# Patient Record
Sex: Female | Born: 1950
Health system: Southern US, Community
[De-identification: ages and names within clinical notes are randomized; demographics above are authoritative.]

## PROBLEM LIST (undated history)

## (undated) DIAGNOSIS — K219 Gastro-esophageal reflux disease without esophagitis: Secondary | ICD-10-CM

## (undated) DIAGNOSIS — R799 Abnormal finding of blood chemistry, unspecified: Secondary | ICD-10-CM

## (undated) DIAGNOSIS — E559 Vitamin D deficiency, unspecified: Secondary | ICD-10-CM

## (undated) DIAGNOSIS — R7303 Prediabetes: Secondary | ICD-10-CM

## (undated) DIAGNOSIS — E041 Nontoxic single thyroid nodule: Secondary | ICD-10-CM

## (undated) DIAGNOSIS — E876 Hypokalemia: Secondary | ICD-10-CM

## (undated) DIAGNOSIS — E782 Mixed hyperlipidemia: Secondary | ICD-10-CM

## (undated) DIAGNOSIS — M25511 Pain in right shoulder: Secondary | ICD-10-CM

## (undated) DIAGNOSIS — R209 Unspecified disturbances of skin sensation: Secondary | ICD-10-CM

## (undated) DIAGNOSIS — K449 Diaphragmatic hernia without obstruction or gangrene: Secondary | ICD-10-CM

## (undated) DIAGNOSIS — R768 Other specified abnormal immunological findings in serum: Secondary | ICD-10-CM

## (undated) DIAGNOSIS — Z0000 Encounter for general adult medical examination without abnormal findings: Secondary | ICD-10-CM

## (undated) DIAGNOSIS — R32 Unspecified urinary incontinence: Secondary | ICD-10-CM

## (undated) DIAGNOSIS — J42 Unspecified chronic bronchitis: Secondary | ICD-10-CM

## (undated) DIAGNOSIS — R609 Edema, unspecified: Secondary | ICD-10-CM

## (undated) DIAGNOSIS — E039 Hypothyroidism, unspecified: Secondary | ICD-10-CM

## (undated) DIAGNOSIS — N6009 Solitary cyst of unspecified breast: Secondary | ICD-10-CM

## (undated) DIAGNOSIS — E785 Hyperlipidemia, unspecified: Secondary | ICD-10-CM

## (undated) DIAGNOSIS — K760 Fatty (change of) liver, not elsewhere classified: Secondary | ICD-10-CM

## (undated) DIAGNOSIS — R0789 Other chest pain: Secondary | ICD-10-CM

## (undated) DIAGNOSIS — C4490 Unspecified malignant neoplasm of skin, unspecified: Secondary | ICD-10-CM

## (undated) DIAGNOSIS — R490 Dysphonia: Secondary | ICD-10-CM

## (undated) DIAGNOSIS — R739 Hyperglycemia, unspecified: Secondary | ICD-10-CM

## (undated) DIAGNOSIS — Z780 Asymptomatic menopausal state: Secondary | ICD-10-CM

## (undated) DIAGNOSIS — R0602 Shortness of breath: Secondary | ICD-10-CM

## (undated) DIAGNOSIS — G4730 Sleep apnea, unspecified: Secondary | ICD-10-CM

## (undated) DIAGNOSIS — M2550 Pain in unspecified joint: Secondary | ICD-10-CM

## (undated) DIAGNOSIS — I10 Essential (primary) hypertension: Secondary | ICD-10-CM

## (undated) DIAGNOSIS — M129 Arthropathy, unspecified: Secondary | ICD-10-CM

## (undated) DIAGNOSIS — Z79899 Other long term (current) drug therapy: Secondary | ICD-10-CM

## (undated) DIAGNOSIS — M549 Dorsalgia, unspecified: Secondary | ICD-10-CM

## (undated) DIAGNOSIS — R002 Palpitations: Secondary | ICD-10-CM

## (undated) DIAGNOSIS — L0390 Cellulitis, unspecified: Secondary | ICD-10-CM

## (undated) DIAGNOSIS — E079 Disorder of thyroid, unspecified: Secondary | ICD-10-CM

## (undated) DIAGNOSIS — K5900 Constipation, unspecified: Secondary | ICD-10-CM

## (undated) DIAGNOSIS — E669 Obesity, unspecified: Secondary | ICD-10-CM

## (undated) HISTORY — DX: Hypothyroidism, unspecified: E03.9

## (undated) HISTORY — DX: Pain in right shoulder: M25.511

## (undated) HISTORY — DX: Gastro-esophageal reflux disease without esophagitis: K21.9

## (undated) HISTORY — DX: Abnormal finding of blood chemistry, unspecified: R79.9

## (undated) HISTORY — DX: Other chest pain: R07.89

## (undated) HISTORY — DX: Mixed hyperlipidemia: E78.2

## (undated) HISTORY — DX: Unspecified disturbances of skin sensation: R20.9

## (undated) HISTORY — DX: Palpitations: R00.2

## (undated) HISTORY — DX: Hyperglycemia, unspecified: R73.9

## (undated) HISTORY — PX: BREAST CYST ASPIRATION: SHX578

## (undated) HISTORY — DX: Constipation, unspecified: K59.00

## (undated) HISTORY — DX: Hyperlipidemia, unspecified: E78.5

## (undated) HISTORY — DX: Shortness of breath: R06.02

## (undated) HISTORY — DX: Sleep apnea, unspecified: G47.30

## (undated) HISTORY — DX: Pain in unspecified joint: M25.50

## (undated) HISTORY — DX: Dorsalgia, unspecified: M54.9

## (undated) HISTORY — DX: Obesity, unspecified: E66.9

## (undated) HISTORY — DX: Other long term (current) drug therapy: Z79.899

## (undated) HISTORY — DX: Cellulitis, unspecified: L03.90

## (undated) HISTORY — DX: Arthropathy, unspecified: M12.9

## (undated) HISTORY — DX: Disorder of thyroid, unspecified: E07.9

## (undated) HISTORY — DX: Encounter for general adult medical examination without abnormal findings: Z00.00

## (undated) HISTORY — DX: Edema, unspecified: R60.9

## (undated) HISTORY — DX: Unspecified urinary incontinence: R32

## (undated) HISTORY — DX: Nontoxic single thyroid nodule: E04.1

## (undated) HISTORY — DX: Diaphragmatic hernia without obstruction or gangrene: K44.9

## (undated) HISTORY — DX: Other specified abnormal immunological findings in serum: R76.8

## (undated) HISTORY — DX: Vitamin D deficiency, unspecified: E55.9

## (undated) HISTORY — PX: WISDOM TOOTH EXTRACTION: SHX21

## (undated) HISTORY — DX: Asymptomatic menopausal state: Z78.0

## (undated) HISTORY — DX: Dysphonia: R49.0

## (undated) HISTORY — DX: Essential (primary) hypertension: I10

## (undated) HISTORY — DX: Hypokalemia: E87.6

---

## 1988-06-08 HISTORY — PX: LIPOSUCTION: SHX10

## 2012-06-08 LAB — HM COLONOSCOPY

## 2013-06-08 LAB — HM MAMMOGRAPHY: HM MAMMO: NORMAL (ref 0–4)

## 2013-06-08 LAB — HM PAP SMEAR: HM Pap smear: NORMAL

## 2014-10-04 ENCOUNTER — Encounter: Payer: Self-pay | Admitting: Internal Medicine

## 2014-12-05 ENCOUNTER — Encounter: Payer: Self-pay | Admitting: Internal Medicine

## 2014-12-11 ENCOUNTER — Ambulatory Visit (INDEPENDENT_AMBULATORY_CARE_PROVIDER_SITE_OTHER): Admitting: Internal Medicine

## 2014-12-11 ENCOUNTER — Encounter: Payer: Self-pay | Admitting: Internal Medicine

## 2014-12-11 VITALS — BP 110/74 | HR 77 | Temp 98.4°F | Resp 16 | Ht 62.5 in | Wt 195.0 lb

## 2014-12-11 DIAGNOSIS — E041 Nontoxic single thyroid nodule: Secondary | ICD-10-CM | POA: Diagnosis not present

## 2014-12-11 DIAGNOSIS — E039 Hypothyroidism, unspecified: Secondary | ICD-10-CM | POA: Diagnosis not present

## 2014-12-11 DIAGNOSIS — E079 Disorder of thyroid, unspecified: Secondary | ICD-10-CM

## 2014-12-11 HISTORY — DX: Disorder of thyroid, unspecified: E07.9

## 2014-12-11 LAB — T3, FREE: T3, Free: 4.5 pg/mL — ABNORMAL HIGH (ref 2.3–4.2)

## 2014-12-11 LAB — T4, FREE: Free T4: 0.74 ng/dL (ref 0.60–1.60)

## 2014-12-11 LAB — TSH: TSH: 0.64 u[IU]/mL (ref 0.35–4.50)

## 2014-12-11 NOTE — Patient Instructions (Signed)
Please stop at the lab.  Please continue the Armour 120 mg daily.  Take the thyroid hormone every day, with water, >30 minutes before breakfast, separated by >4 hours from acid reflux medications, calcium, iron, multivitamins.  Please come back for a follow-up appointment in 4 months.

## 2014-12-11 NOTE — Progress Notes (Addendum)
Patient ID: Destiny [name], female   DOB: 05/27/1951, 64 y.o.   MRN: [id number]   HPI  Destiny [name] is a 64 y.o.-year-old female, referred by her PCP, Isaias Cowman, PA , for evaluation for a left thyroid nodule. She also has hypothyroidism.  Her thyroid nodule was incidentally found in 09/2014.   Thyroid U/S [facility], 10/01/2014):  Right lobe: 3.3 x 1.0 x 0.6 cm: 5 mm upper pole nodule, 5 mm mid pole nodule and 6 mm lower pole nodule  Left lobe: 3.9 x 0.9 x 1.1 cm. 2.4 x 0.4 x 0.5 cm hypoechoic midpole nodule extending into upper and lower poles.  Isthmus: 0.8 cm  Patient also has hypothyroidism diagnosed 30 years ago. She started on Synthroid >> delusions >> started Armour 25 years ago.   She is taking Armour 120 mg daily (equivalent of 200 g LT4 daily) - last change 6 mo ago to 90 mg, then back to 120 mg 3 mo ago: - In a.m. - Fasting, but not always! - With water   - No PPI, multivitamins, calcium, iron   No recent thyroid tests available.  Pt denies feeling nodules in neck, hoarseness, occasional dysphagia/no odynophagia, sometimes get choked; no SOB with lying down.  Pt c/o: - no heat intolerance/+ cold intolerance - + weight loss - intentional; diet and exercise - no tremors - no palpitations - no hyperdefecation/+ constipation - + dry skin - + hair loss - + fatigue - + anxiety/+ depression - + "Brain fog"  Pt does have a FH of thyroid ds.: aunts, sister, niece. No FH of thyroid cancer. No h/o radiation tx to head or neck.   No seaweed or kelp, no recent contrast studies. No steroid use. No herbal supplements.   I reviewed her chart and she also has a history of hypertension, hyperlipidemia, obesity, right shoulder pain, edema, positive ANA.  ROS: Constitutional: no weight gain/loss, + fatigue, + subjective hypothermia, + poor sleep Eyes: no blurry vision, no xerophthalmia ENT: no sore throat, no nodules palpated in throat, + dysphagia/no  odynophagia, no hoarseness, + tinnitus, decreased hearing Cardiovascular: no CP/SOB/palpitations/+ leg swelling Respiratory: no cough/SOB Gastrointestinal: + N/no V/D/+ C, + acid reflux Musculoskeletal:  Both: muscle/joint aches Skin: no rashes; + hair loss, itching, rash Neurological: no tremors/numbness/tingling/dizziness Psychiatric: + both: depression/anxiety  Past Medical History  Diagnosis Date  . High risk medication use   . Positive ANA (antinuclear antibody)   . Edema   . Shoulder pain, right   . Hypertension   . Hyperlipidemia   . Obesity   . Thyroid nodule   . Hypothyroidism   . GERD (gastroesophageal reflux disease)   . Menopause   . SOB (shortness of breath)   . Hyperglycemia   . Abnormal blood chemistry   . Disturbance of skin sensation   . Chest discomfort   . Arthropathy   . Cellulitis   . Vitamin D deficiency   . Hypopotassemia    No past surgical history on file. History   Social History  . Marital Status: Married    Spouse Name: N/A  . Number of Children: 0   Occupational History  . n/a   Social History Main Topics  . Smoking status: Former Research scientist (life sciences)  . Smokeless tobacco: Not on file  . Alcohol Use: 0.0 oz/week    0 Standard drinks or equivalent per week  . Drug Use: No   Social History Narrative   Occasional alcohol use: hard liquor, wine  and beer   Current Outpatient Prescriptions on File Prior to Visit  Medication Sig Dispense Refill  . hydrochlorothiazide (HYDRODIURIL) 25 MG tablet Take 25 mg by mouth daily.    . irbesartan (AVAPRO) 300 MG tablet Take 150 mg by mouth daily.    . metoprolol succinate (TOPROL-XL) 25 MG 24 hr tablet Take 25 mg by mouth daily.    . phentermine 37.5 MG capsule Take 37.5 mg by mouth every morning.    . thyroid (ARMOUR) 120 MG tablet Take 120 mg by mouth daily before breakfast.    . Cholecalciferol (VITAMIN D PO) Take by mouth.    . COD LIVER OIL PO Take by mouth.    . Cyanocobalamin (B12 LIQUID HEALTH  BOOSTER PO) Take 2 drops by mouth daily.    . RED YEAST RICE EXTRACT PO Take by mouth.     No current facility-administered medications on file prior to visit.   No Known Allergies Family History  Problem Relation Age of Onset  . Irregular heart beat Mother   . Hypertension Father    also,  - diabetes in grandfather - Hyperlipidemia in father - Cancer in uncle - + See history of present illness  PE: BP 110/74 mmHg  Pulse 77  Temp(Src) 98.4 F (36.9 C) (Oral)  Resp 16  Ht 5' 2.5" (1.588 m)  Wt 195 lb (88.451 kg)  BMI 35.08 kg/m2  SpO2 95% Wt Readings from Last 3 Encounters:  12/11/14 195 lb (88.451 kg)   Constitutional: overweight, in NAD Eyes: PERRLA, EOMI, no exophthalmos ENT: moist mucous membranes, no thyromegaly, no thyroid nodules palpable, no cervical lymphadenopathy Cardiovascular: RRR, No MRG Respiratory: CTA B Gastrointestinal: abdomen soft, NT, ND, BS+ Musculoskeletal: no deformities, strength intact in all 4;  Skin: moist, warm, no rashes Neurological: no tremor with outstretched hands, DTR normal in all 4  ASSESSMENT: 1. L thyroid nodule - largest dimension 2.4 cm  2. Hypothyroidism - On Armour thyroid  PLAN: 1. L thyroid nodule - I reviewed the report of her thyroid ultrasound along with the patient. I pointed out that the dominant nodule is large and hypoechoic, these being risk factors for cI do not have other information about them, and I could not reviewed the images since they were not available. Pt does not have a thyroid cancer family history or a personal history of RxTx to head/neck. These would favor benignity.   - the only way that we can tell exactly if it is cancer or not is by doing a thyroid biopsy (FNA). I explained what the test entails. - I explained that this is not cancer, we can continue to follow her on a yearly basis, and check another ultrasound in another year or 2.  - patient decided to have the FNA done now >> I ordered this.   - I'll see her back in  4 months assuming her FNA is normal. If FNA abnormal, we will meet sooner.  - I advised pt to join my chart and I will send her the results through there   2. Hypothyroidism - She is on 120 mg Armour thyroid - no previous available thyroid labs, will check them today, along with her thyroid antibodies - We discussed about taking the thyroid hormone every day, with water, >30 minutes before breakfast, separated by >4 hours from acid reflux medications, calcium, iron, multivitamins. I advised her to separate her arm or from other medications and also from her breakfast. - Return in about 4 months (  around 04/13/2015).  Office Visit on 12/11/2014  Component Date Value Ref Range Status  . TSH 12/11/2014 0.64  0.35 - 4.50 uIU/mL Final  . Free T4 12/11/2014 0.74  0.60 - 1.60 ng/dL Final  . T3, Free 12/11/2014 4.5* 2.3 - 4.2 pg/mL Final  . Thyroperoxidase Ab SerPl-aCnc 12/11/2014 7  <9 IU/mL Final   Good thyroid tests, continue the same dose of Armour.  Thyroid antibodies low, not confirming Hashimoto thyroiditis.   01/23/2015: CLINICAL DATA: Thyroid nodules. Referred for biopsy.  EXAM: THYROID ULTRASOUND  TECHNIQUE: Ultrasound examination of the thyroid gland and adjacent soft tissues was performed.  COMPARISON: 10/01/2014 from [facility]: Right thyroid lobe  Measurements: 31 x 7 x 8 mm. Inhomogeneous with multiple small hypoechoic nodules all less than 4 mm  Left thyroid lobe  Measurements: 32 x 810 x 9 mm. Multiple cystic/ hypoechoic nodules measured up to 5 cm maximum diameter. A (I believe 5 mm is correct!)  Isthmus  Thickness: 1.5 mm. No nodules visualized.  Lymphadenopathy  None visualized.  IMPRESSION: 1. Multiple small bilateral hypoechoic/cystic nodules. No dominant lesion was identified ; biopsy was not performed. Follow-up by clinical exam is recommended. If patient has known risk factors for thyroid carcinoma,  consider follow-up ultrasound in 12 months. If patient is clinically hyperthyroid, consider nuclear medicine thyroid uptake and scan. This recommendation follows the consensus statement: Management of Thyroid Nodules Detected as [place]: Society of Radiologists in [place]. Radiology 2005; [id number].   Electronically Signed By: Lucrezia Europe M.D. On: 01/23/2015 13:17  It looks like the L thyroid nodule seen on the [facility] U/S was actually an inflammatory focus which has since resolved!

## 2014-12-12 ENCOUNTER — Encounter: Payer: Self-pay | Admitting: *Deleted

## 2014-12-12 LAB — THYROID PEROXIDASE ANTIBODY: Thyroperoxidase Ab SerPl-aCnc: 7 IU/mL (ref <9)

## 2014-12-31 ENCOUNTER — Inpatient Hospital Stay
Admission: RE | Admit: 2014-12-31 | Discharge: 2014-12-31 | Disposition: A | Discharge status: Home / Self Care | Payer: Self-pay | Source: Ambulatory Visit | Attending: Internal Medicine | Admitting: Internal Medicine

## 2014-12-31 ENCOUNTER — Other Ambulatory Visit: Payer: Self-pay | Admitting: Internal Medicine

## 2014-12-31 DIAGNOSIS — E041 Nontoxic single thyroid nodule: Secondary | ICD-10-CM

## 2015-01-01 ENCOUNTER — Telehealth: Payer: Self-pay

## 2015-01-01 NOTE — Telephone Encounter (Signed)
Pt called stating about 1 week ago she dropped of a thyroid ultrasound report on a CD for Dr. Cruzita Lederer to review. Pt stated Dr. Cruzita Lederer was going to review the results and decide how to proceed with the thyroid biopsy.  Has Dr. Cruzita Lederer been able to review results?

## 2015-01-02 NOTE — Telephone Encounter (Signed)
Please read message below and advise.  

## 2015-01-02 NOTE — Telephone Encounter (Signed)
I reviewed the CD and the order for Bx is in >> was she not called to schedule.

## 2015-01-02 NOTE — Telephone Encounter (Signed)
Called pt and (pt was napping) advised pt's husband that Dr Cruzita Lederer has reviewed her U/S and has ordered a bx. He stated he will let her know.

## 2015-01-08 ENCOUNTER — Other Ambulatory Visit: Payer: Self-pay | Admitting: Internal Medicine

## 2015-01-08 ENCOUNTER — Telehealth: Payer: Self-pay | Admitting: *Deleted

## 2015-01-08 DIAGNOSIS — E041 Nontoxic single thyroid nodule: Secondary | ICD-10-CM

## 2015-01-08 NOTE — Telephone Encounter (Signed)
Great. Thank you.

## 2015-01-08 NOTE — Telephone Encounter (Signed)
I reordered the U/S to be done in [place].

## 2015-01-08 NOTE — Telephone Encounter (Signed)
The pt already has an appt to get that done on 01/23/2015 at 11:30am.[place] has agreed to do both on that day.

## 2015-01-08 NOTE — Telephone Encounter (Signed)
Baker Janus from [facility] called to let Dr Cruzita Lederer know that he received the disc of the U/S and the images were not good. He advised for the pt to have another U/S soft tissue head and neck. Then they can move forward with a biopsy if needed. Be advised.

## 2015-01-17 ENCOUNTER — Other Ambulatory Visit

## 2015-01-23 ENCOUNTER — Encounter (INDEPENDENT_AMBULATORY_CARE_PROVIDER_SITE_OTHER): Payer: Self-pay

## 2015-01-23 ENCOUNTER — Other Ambulatory Visit: Payer: Self-pay | Admitting: Internal Medicine

## 2015-01-23 ENCOUNTER — Ambulatory Visit
Admission: RE | Admit: 2015-01-23 | Discharge: 2015-01-23 | Disposition: A | Discharge status: Home / Self Care | Source: Ambulatory Visit | Attending: Internal Medicine | Admitting: Internal Medicine

## 2015-01-23 DIAGNOSIS — E041 Nontoxic single thyroid nodule: Secondary | ICD-10-CM

## 2015-01-25 ENCOUNTER — Encounter: Payer: Self-pay | Admitting: *Deleted

## 2015-04-15 ENCOUNTER — Ambulatory Visit (INDEPENDENT_AMBULATORY_CARE_PROVIDER_SITE_OTHER): Admitting: Internal Medicine

## 2015-04-15 ENCOUNTER — Encounter: Payer: Self-pay | Admitting: Internal Medicine

## 2015-04-15 VITALS — BP 114/62 | HR 83 | Temp 98.1°F | Resp 12 | Wt 193.0 lb

## 2015-04-15 DIAGNOSIS — E041 Nontoxic single thyroid nodule: Secondary | ICD-10-CM

## 2015-04-15 DIAGNOSIS — E039 Hypothyroidism, unspecified: Secondary | ICD-10-CM | POA: Diagnosis not present

## 2015-04-15 LAB — TSH: TSH: 0.08 u[IU]/mL — ABNORMAL LOW (ref 0.35–4.50)

## 2015-04-15 LAB — T3, FREE: T3, Free: 3.9 pg/mL (ref 2.3–4.2)

## 2015-04-15 LAB — T4, FREE: Free T4: 0.76 ng/dL (ref 0.60–1.60)

## 2015-04-15 NOTE — Patient Instructions (Signed)
Please stop at the lab.  Please schedule a new appt at the lab in 6 weeks.  Please return in 3 months.

## 2015-04-15 NOTE — Progress Notes (Signed)
Patient ID: Destiny [name], female   DOB: 1950-09-06, 64 y.o.   MRN: [id number]   HPI  Destiny [name] is a 64 y.o.-year-old female, initially referred by her PCP, Isaias Cowman, PA , now returning for follow-up for left thyroid nodule. She also has hypothyroidism. Last visit 4 mo ago.  Her thyroid nodule was incidentally found in 09/2014. She had an initial thyroid ultrasound in 09/2014 that showed a large, 2.4 cm nodule. At last visit I referred her to for biopsy of the nodule, however, a new thyroid ultrasound did not show the nodule. No biopsy was performed during the session.  Thyroid U/S [facility], 10/01/2014):  Right lobe: 3.3 x 1.0 x 0.6 cm: 5 mm upper pole nodule, 5 mm mid pole nodule and 6 mm lower pole nodule  Left lobe: 3.9 x 0.9 x 1.1 cm. 2.4 x 0.4 x 0.5 cm hypoechoic midpole nodule extending into upper and lower poles.  Isthmus: 0.8 cm  Thyroid U/S (01/23/2015): Multiple small bilateral hypoechoic/cystic nodules. No dominant lesion was identified ; biopsy was not performed.   Patient also has hypothyroidism diagnosed 30 years ago. She started on Synthroid >> delusions >> started Armour 25 years ago.   She is taking Armour 120 mg daily (equivalent of 200 g LT4 daily) - last change 6 mo ago to 90 mg, then back to 120 mg 3 mo ago: - In a.m. - Fasting, eats b'fast >30 min later - With water   - No PPI, iron  - takes MVI, Ca,  in am 1h after Armour!  Last TFTs: Lab Results  Component Value Date   TSH 0.64 12/11/2014   FREET4 0.74 12/11/2014  01/28/2015: TSH 0.051, TT3 247.56 (60-181), free T4 1.33 (0.89-1.76)  Pt denies feeling nodules in neck, hoarseness, occasional dysphagia/no odynophagia; no SOB with lying down.  Pt c/o: - no heat intolerance/cold intolerance - + weight gain - no tremors - no palpitations - no hyperdefecation/+ constipation - + dry skin - + hair loss - + fatigue - + "Brain fog"  Pt does have a FH of thyroid ds.: aunts, sister,  niece (Hashimoto's thyroiditis). No FH of thyroid cancer. No h/o radiation tx to head or neck.   No seaweed or kelp, no recent contrast studies. No steroid use. No herbal supplements.   I reviewed her chart and she also has a history of hypertension, hyperlipidemia, obesity, right shoulder pain, edema, positive ANA.  ROS: Constitutional: no weight gain/loss, + fatigue, + subjective hypothermia, + poor sleep Eyes: no blurry vision, no xerophthalmia ENT: no sore throat, no nodules palpated in throat, + dysphagia/no odynophagia, no hoarseness, + tinnitus, decreased hearing Cardiovascular: no CP/SOB/palpitations/+ leg swelling Respiratory: no cough/SOB Gastrointestinal: + N/no V/D/+ C, + acid reflux Musculoskeletal:  Both: muscle/joint aches Skin: no rashes; + hair loss, itching, rash Neurological: no tremors/numbness/tingling/dizziness I reviewed pt's medications, allergies, PMH, social hx, family hx, and changes were documented in the history of present illness. Otherwise, unchanged from my initial visit note.  Past Medical History  Diagnosis Date  . High risk medication use   . Positive ANA (antinuclear antibody)   . Edema   . Shoulder pain, right   . Hypertension   . Hyperlipidemia   . Obesity   . Thyroid nodule   . Hypothyroidism   . GERD (gastroesophageal reflux disease)   . Menopause   . SOB (shortness of breath)   . Hyperglycemia   . Abnormal blood chemistry   . Disturbance of  skin sensation   . Chest discomfort   . Arthropathy   . Cellulitis   . Vitamin D deficiency   . Hypopotassemia    No past surgical history on file. History   Social History  . Marital Status: Married    Spouse Name: N/A  . Number of Children: 0   Occupational History  . n/a   Social History Main Topics  . Smoking status: Former Research scientist (life sciences)  . Smokeless tobacco: Not on file  . Alcohol Use: 0.0 oz/week    0 Standard drinks or equivalent per week  . Drug Use: No   Social History Narrative    Occasional alcohol use: hard liquor, wine and beer   Current Outpatient Prescriptions on File Prior to Visit  Medication Sig Dispense Refill  . Cholecalciferol (VITAMIN D PO) Take by mouth.    . COD LIVER OIL PO Take by mouth.    . Cyanocobalamin (B12 LIQUID HEALTH BOOSTER PO) Take 2 drops by mouth daily.    . hydrochlorothiazide (HYDRODIURIL) 25 MG tablet Take 25 mg by mouth daily.    . irbesartan (AVAPRO) 300 MG tablet Take 150 mg by mouth daily.    . metoprolol succinate (TOPROL-XL) 25 MG 24 hr tablet Take 25 mg by mouth daily.    . phentermine 37.5 MG capsule Take 37.5 mg by mouth every morning.    . RED YEAST RICE EXTRACT PO Take by mouth.    . thyroid (ARMOUR) 120 MG tablet Take 120 mg by mouth daily before breakfast.     No current facility-administered medications on file prior to visit.   No Known Allergies Family History  Problem Relation Age of Onset  . Irregular heart beat Mother   . Hypertension Father    also,  - diabetes in grandfather - Hyperlipidemia in father - Cancer in uncle - + See history of present illness  PE: BP 114/62 mmHg  Pulse 83  Temp(Src) 98.1 F (36.7 C) (Oral)  Resp 12  Wt 193 lb (87.544 kg)  SpO2 97% Wt Readings from Last 3 Encounters:  04/15/15 193 lb (87.544 kg)  12/11/14 195 lb (88.451 kg)   Constitutional: overweight, in NAD Eyes: PERRLA, EOMI, no exophthalmos ENT: moist mucous membranes, no thyromegaly, no thyroid nodules palpable, no cervical lymphadenopathy Cardiovascular: RRR, No MRG Respiratory: CTA B Gastrointestinal: abdomen soft, NT, ND, BS+ Musculoskeletal: no deformities, strength intact in all 4;  Skin: moist, warm, no rashes Neurological: no tremor with outstretched hands, DTR normal in all 4  ASSESSMENT: 1. L thyroid nodule - largest dimension 2.4 cm  - Thyroid ultrasound (01/23/2015): CLINICAL DATA: Thyroid nodules. Referred for biopsy.  EXAM: THYROID ULTRASOUND  TECHNIQUE: Ultrasound examination of  the thyroid gland and adjacent soft tissues was performed.  COMPARISON: 10/01/2014 from [facility]: Right thyroid lobe  Measurements: 31 x 7 x 8 mm. Inhomogeneous with multiple small hypoechoic nodules all less than 4 mm  Left thyroid lobe  Measurements: 32 x 810 x 9 mm. Multiple cystic/ hypoechoic nodules measured up to 5 cm maximum diameter. A (I believe 5 mm is correct!)  Isthmus  Thickness: 1.5 mm. No nodules visualized.  Lymphadenopathy  None visualized.  IMPRESSION: 1. Multiple small bilateral hypoechoic/cystic nodules. No dominant lesion was identified ; biopsy was not performed. Follow-up by clinical exam is recommended. If patient has known risk factors for thyroid carcinoma, consider follow-up ultrasound in 12 months. If patient is clinically hyperthyroid, consider nuclear medicine thyroid uptake and scan. This recommendation follows  the consensus statement: Management of Thyroid Nodules Detected as [place]: Society of Radiologists in [place]. Radiology 2005; [id number].   Electronically Signed By: Lucrezia Europe M.D. On: 01/23/2015 13:17  It looks like the L thyroid nodule seen on the [facility] U/S was actually an inflammatory focus which has since resolved!  2. Hypothyroidism - On Armour thyroid  PLAN: 1. L thyroid nodule - I reviewed the report of her latest thyroid ultrasound along with the patient. I on the previous ultrasound, she had a large dominant nodule which appeared hypoechoic, size and hypoechogenicity being risk factors for malignancy. We repeated a thyroid ultrasound and the large nodule was not observed again, pointing towards the possibility of a no regional inflammatory focus, which has now resolved. - We will need to repeat the thyroid ultrasound at some point in the future but no further intervention is needed for now.  2. Hypothyroidism - She is on 120 mg Armour thyroid - Reviewed previous  thyroid labs from last visit >> normal - however, after she started to take the Armour correctly >> TSH found to be low at visit with PCP in 01/2015 >> I did not know about the abnormal TSH >> she is still on the same dose of Armour. She has hair loss, weight gain. - will recheck labs today - We discussed about taking the thyroid hormone every day, with water, >30 minutes before breakfast, separated by >4 hours from acid reflux medications, calcium, iron, multivitamins. She is taking MVI and Ca 1h after Armour >> advised her to separate them by 4 h - Return in about 3 months (around 07/16/2015).  Needs refills.  Component     Latest Ref Rng 04/15/2015  TSH     0.35 - 4.50 uIU/mL 0.08 (L)  Free T4     0.60 - 1.60 ng/dL 0.76  T3, Free     2.3 - 4.2 pg/mL 3.9  TSH is still low, will need to decrease the dose of Armour to 90 mg. We will need a repeat set of TFTs in a month and a half.

## 2015-04-16 MED ORDER — ARMOUR THYROID 90 MG PO TABS: 90.0000 mg | ORAL_TABLET | Freq: Every day | ORAL | Status: DC

## 2015-05-27 ENCOUNTER — Other Ambulatory Visit (INDEPENDENT_AMBULATORY_CARE_PROVIDER_SITE_OTHER)

## 2015-05-27 DIAGNOSIS — E041 Nontoxic single thyroid nodule: Secondary | ICD-10-CM | POA: Diagnosis not present

## 2015-05-27 LAB — T3, FREE: T3 FREE: 3.5 pg/mL (ref 2.3–4.2)

## 2015-05-27 LAB — T4, FREE: FREE T4: 0.65 ng/dL (ref 0.60–1.60)

## 2015-05-27 LAB — TSH: TSH: 0.3 u[IU]/mL — ABNORMAL LOW (ref 0.35–4.50)

## 2015-05-29 ENCOUNTER — Other Ambulatory Visit: Payer: Self-pay | Admitting: *Deleted

## 2015-05-29 ENCOUNTER — Encounter: Payer: Self-pay | Admitting: *Deleted

## 2015-05-29 MED ORDER — ARMOUR THYROID 90 MG PO TABS: 90.0000 mg | ORAL_TABLET | Freq: Every day | ORAL | Status: DC

## 2015-07-16 ENCOUNTER — Ambulatory Visit (INDEPENDENT_AMBULATORY_CARE_PROVIDER_SITE_OTHER): Admitting: Internal Medicine

## 2015-07-16 ENCOUNTER — Encounter: Payer: Self-pay | Admitting: Internal Medicine

## 2015-07-16 VITALS — BP 114/62 | HR 77 | Temp 98.0°F | Resp 12 | Wt 200.0 lb

## 2015-07-16 DIAGNOSIS — E041 Nontoxic single thyroid nodule: Secondary | ICD-10-CM

## 2015-07-16 DIAGNOSIS — E039 Hypothyroidism, unspecified: Secondary | ICD-10-CM

## 2015-07-16 LAB — T4, FREE: Free T4: 0.76 ng/dL (ref 0.60–1.60)

## 2015-07-16 LAB — TSH: TSH: 0.25 u[IU]/mL — ABNORMAL LOW (ref 0.35–4.50)

## 2015-07-16 LAB — T3, FREE: T3, Free: 3.7 pg/mL (ref 2.3–4.2)

## 2015-07-16 MED ORDER — ARMOUR THYROID 90 MG PO TABS: 90.0000 mg | ORAL_TABLET | ORAL | Status: DC

## 2015-07-16 MED ORDER — ARMOUR THYROID 60 MG PO TABS: 60.0000 mg | ORAL_TABLET | ORAL | Status: DC

## 2015-07-16 NOTE — Progress Notes (Signed)
Patient ID: Destiny [name], female   DOB: 1951-01-30, 65 y.o.   MRN: [id number]   HPI  Destiny [name] is a 65 y.o.-year-old female, initially referred by her PCP, Isaias Cowman, PA , now returning for follow-up for left thyroid nodule. She also has hypothyroidism. Last visit 3 mo ago.  Patient had a thyroid nodule that was incidentally found in 09/2014. She had an initial thyroid ultrasound in 09/2014 that showed a large, 2.4 cm nodule. At last visit I referred her to for biopsy of the nodule, however, a new thyroid ultrasound did not show the nodule. No biopsy was performed during the session.  Thyroid U/S [facility], 10/01/2014):  Right lobe: 3.3 x 1.0 x 0.6 cm: 5 mm upper pole nodule, 5 mm mid pole nodule and 6 mm lower pole nodule  Left lobe: 3.9 x 0.9 x 1.1 cm. 2.4 x 0.4 x 0.5 cm hypoechoic midpole nodule extending into upper and lower poles.  Isthmus: 0.8 cm  Thyroid U/S (01/23/2015): Multiple small bilateral hypoechoic/cystic nodules. No dominant lesion was identified; biopsy was not performed.   She denies neck compression symptoms.  Patient also has hypothyroidism diagnosed 30 years ago. She started on Synthroid >> delusions >> started Armour 25 years ago.   She is taking Armour 90 mg daily (equivalent of 150 g LT4 daily): - In a.m. - Fasting, eats b'fast >30 min later - With water   - takes prn PPIs later in the day - no iron - takes MVI, Ca - nighttime  Last TFTs: Lab Results  Component Value Date   TSH 0.30* 05/27/2015   TSH 0.08* 04/15/2015   TSH 0.64 12/11/2014   FREET4 0.65 05/27/2015   FREET4 0.76 04/15/2015   FREET4 0.74 12/11/2014  01/28/2015: TSH 0.051, TT3 247.56 (60-181), free T4 1.33 (0.89-1.76)  Pt denies feeling nodules in neck, hoarseness, occasional dysphagia/no odynophagia; no SOB with lying down.  Pt c/o: - no heat intolerance/cold intolerance - + weight gain (mentions that she is on an 800 calorie diet w/o results) - no  tremors - no palpitations - no hyperdefecation/+ constipation - + dry skin - + hair loss - + fatigue - + "Brain fog" - + slow healing - + insomnia   Pt does have a FH of thyroid ds.: aunts, sister, niece (Hashimoto's thyroiditis). No FH of thyroid cancer. No h/o radiation tx to head or neck.   No seaweed or kelp, no recent contrast studies. No steroid use. No herbal supplements.   I reviewed her chart and she also has a history of hypertension, hyperlipidemia, obesity, right shoulder pain, edema, positive ANA.  She quit smoking.   ROS: Constitutional: + weight gain, + fatigue, + subjective hypothermia, + poor sleep Eyes: no blurry vision, no xerophthalmia ENT: no sore throat, no nodules palpated in throat, + dysphagia/no odynophagia, no hoarseness, + tinnitus, decreased hearing Cardiovascular: no CP/SOB/palpitations/+ leg swelling Respiratory: no cough/SOB Gastrointestinal: + N/no V/D/+ C, + acid reflux Musculoskeletal:  Both: muscle/joint aches Skin: no rashes; + hair loss Neurological: no tremors/numbness/tingling/dizziness  I reviewed pt's medications, allergies, PMH, social hx, family hx, and changes were documented in the history of present illness. Otherwise, unchanged from my initial visit note.  Past Medical History  Diagnosis Date  . High risk medication use   . Positive ANA (antinuclear antibody)   . Edema   . Shoulder pain, right   . Hypertension   . Hyperlipidemia   . Obesity   . Thyroid nodule   .  Hypothyroidism   . GERD (gastroesophageal reflux disease)   . Menopause   . SOB (shortness of breath)   . Hyperglycemia   . Abnormal blood chemistry   . Disturbance of skin sensation   . Chest discomfort   . Arthropathy   . Cellulitis   . Vitamin D deficiency   . Hypopotassemia    No past surgical history on file. History   Social History  . Marital Status: Married    Spouse Name: N/A  . Number of Children: 0   Occupational History  . n/a    Social History Main Topics  . Smoking status: Former Research scientist (life sciences)  . Smokeless tobacco: Not on file  . Alcohol Use: 0.0 oz/week    0 Standard drinks or equivalent per week  . Drug Use: No   Social History Narrative   Occasional alcohol use: hard liquor, wine and beer   Current Outpatient Prescriptions on File Prior to Visit  Medication Sig Dispense Refill  . ARMOUR THYROID 90 MG tablet Take 1 tablet (90 mg total) by mouth daily. 45 tablet 2  . Cholecalciferol (VITAMIN D PO) Take by mouth.    . COD LIVER OIL PO Take by mouth. Reported on 07/16/2015    . Cyanocobalamin (B12 LIQUID HEALTH BOOSTER PO) Take 2 drops by mouth daily.    . hydrochlorothiazide (HYDRODIURIL) 25 MG tablet Take 25 mg by mouth daily.    . irbesartan (AVAPRO) 300 MG tablet Take 150 mg by mouth daily.    . metoprolol succinate (TOPROL-XL) 25 MG 24 hr tablet Take 25 mg by mouth daily.    . RED YEAST RICE EXTRACT PO Take by mouth. Reported on 07/16/2015     No current facility-administered medications on file prior to visit.   No Known Allergies Family History  Problem Relation Age of Onset  . Irregular heart beat Mother   . Hypertension Father    also,  - diabetes in grandfather - Hyperlipidemia in father - Cancer in uncle - + See history of present illness  PE: BP 114/62 mmHg  Pulse 77  Temp(Src) 98 F (36.7 C) (Oral)  Resp 12  Wt 200 lb (90.719 kg)  SpO2 97% Body mass index is 35.97 kg/(m^2). Wt Readings from Last 3 Encounters:  07/16/15 200 lb (90.719 kg)  04/15/15 193 lb (87.544 kg)  12/11/14 195 lb (88.451 kg)   Constitutional: overweight, in NAD Eyes: PERRLA, EOMI, no exophthalmos ENT: moist mucous membranes, no thyromegaly, no thyroid nodules palpable, no cervical lymphadenopathy Cardiovascular: RRR, No MRG Respiratory: CTA B Gastrointestinal: abdomen soft, NT, ND, BS+ Musculoskeletal: no deformities, strength intact in all 4;  Skin: moist, warm, no rashes Neurological: no tremor with  outstretched hands, DTR normal in all 4  ASSESSMENT: 1. L thyroid nodule - largest dimension 2.4 cm  - Thyroid ultrasound (01/23/2015): CLINICAL DATA: Thyroid nodules. Referred for biopsy.  EXAM: THYROID ULTRASOUND  TECHNIQUE: Ultrasound examination of the thyroid gland and adjacent soft tissues was performed.  COMPARISON: 10/01/2014 from [place]: Right thyroid lobe  Measurements: 31 x 7 x 8 mm. Inhomogeneous with multiple small hypoechoic nodules all less than 4 mm  Left thyroid lobe  Measurements: 32 x 810 x 9 mm. Multiple cystic/ hypoechoic nodules measured up to 5 cm maximum diameter. A (I believe 5 mm is correct!)  Isthmus  Thickness: 1.5 mm. No nodules visualized.  Lymphadenopathy  None visualized.  IMPRESSION: 1. Multiple small bilateral hypoechoic/cystic nodules. No dominant lesion was identified ; biopsy  was not performed. Follow-up by clinical exam is recommended. If patient has known risk factors for thyroid carcinoma, consider follow-up ultrasound in 12 months. If patient is clinically hyperthyroid, consider nuclear medicine thyroid uptake and scan. This recommendation follows the consensus statement: Management of Thyroid Nodules Detected as [place]: Society of Radiologists in [place]. Radiology 2005; [id number].   Electronically Signed By: Lucrezia Europe M.D. On: 01/23/2015 13:17  It looks like the L thyroid nodule seen on the [place] U/S was actually an inflammatory focus which has since resolved!  2. Hypothyroidism - On Armour thyroid  PLAN: 1. L thyroid nodule - I reviewed the report of her latest thyroid ultrasound along with the patient. I on the previous ultrasound, she had a large dominant nodule which appeared hypoechoic, size and hypoechogenicity being risk factors for malignancy. We repeated a thyroid ultrasound and the large nodule was not observed again, pointing towards the  possibility of a no regional inflammatory focus, which has now resolved. - We may need to repeat the thyroid ultrasound at some point in the future but no further intervention is needed for now. - no neck compression sxs  2. Hypothyroidism - She continues to have hair loss, weight gain. - She is on 90 mg Armour thyroid - Reviewed previous thyroid labs >> improved after reducing the Armour from 120 to 90 mg, but TSH not quite in the normal range at last check - will recheck labs today - We discussed about taking the thyroid hormone every day, with water, >30 minutes before breakfast, separated by >4 hours from acid reflux medications, calcium, iron, multivitamins. She is taking th Armour correcly - Return in about 6 months (around 01/13/2016).  Needs refills.  Office Visit on 07/16/2015  Component Date Value Ref Range Status  . Free T4 07/16/2015 0.76  0.60 - 1.60 ng/dL Final  . T3, Free 07/16/2015 3.7  2.3 - 4.2 pg/mL Final  . TSH 07/16/2015 0.25* 0.35 - 4.50 uIU/mL Final   Her TSH level is still mildly suppressed. Will decrease the dose of Armour to alternating 60 with 90 mg every other day. We'll recheck her TFTs in 5-6 weeks.

## 2015-07-16 NOTE — Patient Instructions (Addendum)
Please stop at the lab.  Please continue Armour 90 mg daily.  Take the thyroid hormone every day, with water, at least 30 minutes before breakfast, separated by at least 4 hours from: - acid reflux medications - calcium - iron - multivitamins  Please return in 6 months.

## 2015-07-18 ENCOUNTER — Encounter: Payer: Self-pay | Admitting: *Deleted

## 2015-07-19 ENCOUNTER — Telehealth: Payer: Self-pay | Admitting: Internal Medicine

## 2015-07-19 NOTE — Telephone Encounter (Signed)
Pt stated she was returning a phone call, she doesn't have an answering machine so she isn't sure who called.

## 2015-07-19 NOTE — Telephone Encounter (Signed)
Called pt and advised her per Dr Arman Filter result note. Pt voiced understanding. Pt will call back and schedule lab appt.

## 2015-07-31 ENCOUNTER — Telehealth: Payer: Self-pay | Admitting: Internal Medicine

## 2015-07-31 NOTE — Telephone Encounter (Signed)
Patient called stating that she has gained 8 lbs in one to two weeks  Destiny [name] believes this was due to a change in her medication dosage   Please advise    Thank you

## 2015-08-01 NOTE — Telephone Encounter (Signed)
It is not time to recheck the TFTs (we need to check int he second week or March) >> can she continue this dose until then to make sure? We did not change the dose too much at last check.Marland KitchenMarland Kitchen

## 2015-08-01 NOTE — Telephone Encounter (Signed)
Please read message below and advise.  

## 2015-08-02 NOTE — Telephone Encounter (Signed)
Returned pt's call and advised her per Dr Arman Filter message. Pt voiced understanding. Pt has sinus problem and will let us know if it does not improve.

## 2015-08-30 ENCOUNTER — Other Ambulatory Visit (INDEPENDENT_AMBULATORY_CARE_PROVIDER_SITE_OTHER)

## 2015-08-30 ENCOUNTER — Encounter (INDEPENDENT_AMBULATORY_CARE_PROVIDER_SITE_OTHER): Payer: Self-pay

## 2015-08-30 DIAGNOSIS — E039 Hypothyroidism, unspecified: Secondary | ICD-10-CM

## 2015-08-30 LAB — T3, FREE: T3 FREE: 3.1 pg/mL (ref 2.3–4.2)

## 2015-08-30 LAB — TSH: TSH: 3.17 u[IU]/mL (ref 0.35–4.50)

## 2015-08-30 LAB — T4, FREE: Free T4: 0.56 ng/dL — ABNORMAL LOW (ref 0.60–1.60)

## 2015-11-12 ENCOUNTER — Other Ambulatory Visit: Payer: Self-pay | Admitting: Internal Medicine

## 2015-12-24 ENCOUNTER — Telehealth: Payer: Self-pay | Admitting: *Deleted

## 2015-12-24 NOTE — Telephone Encounter (Signed)
Unable to reach patient at time of Pre-Visit Call. No answer, no voicemail.

## 2015-12-25 ENCOUNTER — Telehealth: Payer: Self-pay | Admitting: Family Medicine

## 2015-12-25 ENCOUNTER — Encounter: Payer: Self-pay | Admitting: Family Medicine

## 2015-12-25 ENCOUNTER — Ambulatory Visit (INDEPENDENT_AMBULATORY_CARE_PROVIDER_SITE_OTHER): Admitting: Family Medicine

## 2015-12-25 VITALS — BP 124/72 | HR 72 | Temp 97.7°F | Ht 63.0 in | Wt 215.2 lb

## 2015-12-25 DIAGNOSIS — R768 Other specified abnormal immunological findings in serum: Secondary | ICD-10-CM

## 2015-12-25 DIAGNOSIS — R200 Anesthesia of skin: Secondary | ICD-10-CM

## 2015-12-25 DIAGNOSIS — R202 Paresthesia of skin: Secondary | ICD-10-CM

## 2015-12-25 DIAGNOSIS — E669 Obesity, unspecified: Secondary | ICD-10-CM | POA: Diagnosis not present

## 2015-12-25 LAB — RHEUMATOID FACTOR: Rhuematoid fact SerPl-aCnc: <10 IU/mL (ref <=14)

## 2015-12-25 MED ORDER — PHENTERMINE HCL 15 MG PO CAPS
15.0000 mg | ORAL_CAPSULE | ORAL | Status: DC
Start: 2015-12-25 — End: 2016-06-17

## 2015-12-25 NOTE — Patient Instructions (Addendum)
We will do some labs today to help determine if your past positive ANA is anything of concern I will arrange an MRI of your neck to try and find any cause of your numbness symptoms Start on the phentermine for weight loss- please check your BP in a week or so and send me a message with some readings  Let's meet back in 2-3 months!

## 2015-12-25 NOTE — Telephone Encounter (Signed)
Relation to [id number] Call back [place], [place] [phone] (Phone) [phone] (Fax)         Reason for call:  Patient requesting metoprolol succinate (TOPROL-XL) 25 MG 24 hr tablet, hydrochlorothiazide (HYDRODIURIL) 25 MG tablet and irbesartan (AVAPRO) 300 MG tablet

## 2015-12-25 NOTE — Progress Notes (Signed)
[place] at [facility] [address], [place], [place] [address] [phone]  Date:  12/25/2015   Name:  Destiny [name]   DOB:  1951-01-01   MRN:  [id number]  PCP:  Lamar Blinks, MD    Chief Complaint: Establish Care   History of Present Illness:  Destiny [name] is a 65 y.o. very pleasant female patient who presents with the following:  Here today as a new patient.  Dr. Cruzita Lederer sees her for her thyroid, but she needs a PCP.   She does see OBG- in [place], [facility] Dr. Tish Men  She is on BP medication and her thyroid med  She is s/p menopause  She has noted numbness in her hands/ arms if she raises her arms over her head; this has gone on for 2-3 years or longer. Sx will occur in both her hands.  When she puts her arms back down she will get her sensation back in 5 mintues or so.   The tip of her right thumb has been numb for a month or so constantly.   Nothing like this in her legs or feet.   Never had this evaluated in the past. No history of spine surgery  She does not have a pacemaker or any other devices present and can have an MRI  She has been overweight "all my life," but worse over the last 2-3 years.  She has been under a lot of stress with her job which contributes to weight gain.   She was trying to hold herself to 800- 1200 calories a day and exercising in order to control her weight.   She has tried phentermine in the past but stopped taking it.    She also does have an autoimmune disorder perhaps- she was noted to have a positive ANA over a year ago.  States that she did see rheumatology but she did not get a clear dx.  She is not sure if the ANA was truly significant She has tried belviq in the past- it was much too expensive for her and she would rather go back on phentermine   Wt Readings from Last 3 Encounters:  12/25/15 215 lb 3.2 oz (97.614 kg)  07/16/15 200 lb (90.719 kg)  04/15/15 193  lb (87.544 kg)   Lab Results  Component Value Date   TSH 3.17 08/30/2015   Patient Active Problem List   Diagnosis Date Noted  . Thyroid nodule 12/11/2014    Past Medical History  Diagnosis Date  . High risk medication use   . Positive ANA (antinuclear antibody)   . Edema   . Shoulder pain, right   . Hypertension   . Hyperlipidemia   . Obesity   . Thyroid nodule   . Hypothyroidism   . GERD (gastroesophageal reflux disease)   . Menopause   . SOB (shortness of breath)   . Hyperglycemia   . Abnormal blood chemistry   . Disturbance of skin sensation   . Chest discomfort   . Arthropathy   . Cellulitis   . Vitamin D deficiency   . Hypopotassemia     No past surgical history on file.  Social History  Substance Use Topics  . Smoking status: Former Research scientist (life sciences)  . Smokeless tobacco: Never Used  . Alcohol Use: 0.0 oz/week    0 Standard drinks or equivalent per week    Family History  Problem Relation Age of Onset  .  Irregular heart beat Mother   . Hypertension Father     No Known Allergies  Medication list has been reviewed and updated.  Current Outpatient Prescriptions on File Prior to Visit  Medication Sig Dispense Refill  . ARMOUR THYROID 60 MG tablet TAKE ONE TABLET BY MOUTH EVERY OTHER DAY 30 tablet 2  . ARMOUR THYROID 90 MG tablet Take 1 tablet (90 mg total) by mouth every other day. 30 tablet 1  . Cholecalciferol (VITAMIN D PO) Take by mouth.    . COD LIVER OIL PO Take by mouth. Reported on 07/16/2015    . Cyanocobalamin (B12 LIQUID HEALTH BOOSTER PO) Take 2 drops by mouth daily.    . hydrochlorothiazide (HYDRODIURIL) 25 MG tablet Take 25 mg by mouth daily.    . irbesartan (AVAPRO) 300 MG tablet Take 150 mg by mouth daily.    . metoprolol succinate (TOPROL-XL) 25 MG 24 hr tablet Take 25 mg by mouth daily.    . RED YEAST RICE EXTRACT PO Take by mouth. Reported on 07/16/2015     No current facility-administered medications on file prior to visit.    Review of  Systems:  As per HPI- otherwise negative.   Physical Examination: Filed Vitals:   12/25/15 1520  BP: 124/72  Pulse: 72  Temp: 97.7 F (36.5 C)   Filed Vitals:   12/25/15 1520  Height: _0  (1.6 m)  Weight: 215 lb 3.2 oz (97.614 kg)   Body mass index is 38.13 kg/(m^2). Ideal Body Weight: Weight in (lb) to have BMI = 25: 140.8  GEN: WDWN, NAD, Non-toxic, A & O x 3, obese, otherwise looks well HEENT: Atraumatic, Normocephalic. Neck supple. No masses, No LAD.  Bilateral TM wnl, oropharynx normal.  PEERL,EOMI.   Ears and Nose: No external deformity. CV: RRR, No M/G/R. No JVD. No thrill. No extra heart sounds. PULM: CTA B, no wheezes, crackles, rhonchi. No retractions. No resp. distress. No accessory muscle use. ABD: S, NT, ND EXTR: No c/c/e.  Normal movement and strength of both arms and hands.  Normal ROM of her neck NEURO Normal gait.  PSYCH: Normally interactive. Conversant. Not depressed or anxious appearing.  Calm demeanor.   Assessment and Plan: Positive ANA (antinuclear antibody) - Plan: C-reactive protein, ANA, Sed Rate (ESR), Anti-Smith antibody, Rheumatoid Factor, Anti-DNA antibody, double-stranded  Obesity - Plan: phentermine 15 MG capsule  Numbness and tingling of right thumb - Plan: MR Cervical Spine Wo Contrast  Paresthesias in left hand - Plan: MR Cervical Spine Wo Contrast  Paresthesias in right hand - Plan: MR Cervical Spine Wo Contrast  Start on phentermine 15 and she will monitor her BP BP currently well controlled MRI of cervical spine to eval numbness of hands with certain positions Auto- immune labs as above  Signed Lamar Blinks, MD

## 2015-12-25 NOTE — Progress Notes (Signed)
Pre visit review using our clinic review tool, if applicable. No additional management support is needed unless otherwise documented below in the visit note. 

## 2015-12-26 ENCOUNTER — Other Ambulatory Visit: Payer: Self-pay | Admitting: Emergency Medicine

## 2015-12-26 LAB — SEDIMENTATION RATE: Sed Rate: 11 mm/hr (ref 0–30)

## 2015-12-26 LAB — ANTI-NUCLEAR AB-TITER (ANA TITER)

## 2015-12-26 LAB — ANA: Anti Nuclear Antibody(ANA): POSITIVE — AB

## 2015-12-26 LAB — ANTI-SMITH ANTIBODY: ENA SM Ab Ser-aCnc: <1

## 2015-12-26 LAB — ANTI-DNA ANTIBODY, DOUBLE-STRANDED: ds DNA Ab: <1 IU/mL

## 2015-12-26 LAB — C-REACTIVE PROTEIN: CRP: 0.2 mg/dL — AB (ref 0.5–20.0)

## 2015-12-26 MED ORDER — METOPROLOL SUCCINATE ER 25 MG PO TB24: 25.0000 mg | ORAL_TABLET | Freq: Every day | ORAL | Status: DC

## 2015-12-26 MED ORDER — HYDROCHLOROTHIAZIDE 25 MG PO TABS: 25.0000 mg | ORAL_TABLET | Freq: Every day | ORAL | Status: DC

## 2015-12-26 MED ORDER — IRBESARTAN 300 MG PO TABS: 150.0000 mg | ORAL_TABLET | Freq: Every day | ORAL | Status: DC

## 2015-12-28 ENCOUNTER — Ambulatory Visit (HOSPITAL_BASED_OUTPATIENT_CLINIC_OR_DEPARTMENT_OTHER)
Admission: RE | Admit: 2015-12-28 | Discharge: 2015-12-28 | Disposition: A | Discharge status: Home / Self Care | Source: Ambulatory Visit | Attending: Family Medicine | Admitting: Family Medicine

## 2015-12-28 DIAGNOSIS — M4802 Spinal stenosis, cervical region: Secondary | ICD-10-CM | POA: Insufficient documentation

## 2015-12-28 DIAGNOSIS — R200 Anesthesia of skin: Secondary | ICD-10-CM

## 2015-12-28 DIAGNOSIS — R202 Paresthesia of skin: Secondary | ICD-10-CM | POA: Insufficient documentation

## 2015-12-28 DIAGNOSIS — M50221 Other cervical disc displacement at C4-C5 level: Secondary | ICD-10-CM | POA: Diagnosis not present

## 2015-12-30 ENCOUNTER — Encounter: Payer: Self-pay | Admitting: Family Medicine

## 2016-01-02 ENCOUNTER — Encounter: Payer: Self-pay | Admitting: Family Medicine

## 2016-01-13 ENCOUNTER — Encounter: Payer: Self-pay | Admitting: Internal Medicine

## 2016-01-13 ENCOUNTER — Ambulatory Visit (INDEPENDENT_AMBULATORY_CARE_PROVIDER_SITE_OTHER): Admitting: Internal Medicine

## 2016-01-13 VITALS — BP 158/88 | HR 75 | Wt 215.0 lb

## 2016-01-13 DIAGNOSIS — E041 Nontoxic single thyroid nodule: Secondary | ICD-10-CM | POA: Diagnosis not present

## 2016-01-13 DIAGNOSIS — E039 Hypothyroidism, unspecified: Secondary | ICD-10-CM

## 2016-01-13 LAB — TSH: TSH: 7.78 u[IU]/mL — AB (ref 0.35–4.50)

## 2016-01-13 LAB — T4, FREE: Free T4: 0.56 ng/dL — ABNORMAL LOW (ref 0.60–1.60)

## 2016-01-13 LAB — T3, FREE: T3 FREE: 3.4 pg/mL (ref 2.3–4.2)

## 2016-01-13 MED ORDER — ARMOUR THYROID 90 MG PO TABS: ORAL_TABLET | ORAL | 1 refills | Status: DC

## 2016-01-13 MED ORDER — ARMOUR THYROID 60 MG PO TABS: ORAL_TABLET | ORAL | 2 refills | Status: DC

## 2016-01-13 NOTE — Patient Instructions (Signed)
Please continue Armour 90 alternating with 60 mg every other day.  Take the thyroid hormone every day, with water, at least 30 minutes before breakfast, separated by at least 4 hours from: - acid reflux medications - calcium - iron - multivitamins  Please come back for a follow-up appointment in 6 months.

## 2016-01-13 NOTE — Progress Notes (Addendum)
Patient ID: Destiny [name], female   DOB: 04/07/1951, 65 y.o.   MRN: [id number]   HPI  Destiny [name] is a 65 y.o.-year-old female, initially referred by her PCP, Isaias Cowman, PA , now returning for follow-up for left thyroid nodule. She also has hypothyroidism. Last visit 6 mo ago. New PCP: Dr. Lorelei Pont.  Patient had a thyroid nodule that was incidentally found in 09/2014. She had an initial thyroid ultrasound in 09/2014 that showed a large, 2.4 cm nodule. At last visit I referred her to for biopsy of the nodule, however, a new thyroid ultrasound did not show the nodule. No biopsy was performed during the session.  Thyroid U/S [facility], 10/01/2014):  Right lobe: 3.3 x 1.0 x 0.6 cm: 5 mm upper pole nodule, 5 mm mid pole nodule and 6 mm lower pole nodule  Left lobe: 3.9 x 0.9 x 1.1 cm. 2.4 x 0.4 x 0.5 cm hypoechoic midpole nodule extending into upper and lower poles.  Isthmus: 0.8 cm  Thyroid U/S (01/23/2015): Multiple small bilateral hypoechoic/cystic nodules. No dominant lesion was identified; biopsy was not performed.   She denies neck compression symptoms.  Patient also has hypothyroidism diagnosed 30 years ago. She started on Synthroid >> delusions >> started Armour years ago.   She is taking Armour 90 alternating with 60 mg daily - In a.m. (5 am, when wakes up to go to the restroom) - Fasting, eats b'fast >30 min later - With water   - takes prn PPIs later in the day - no iron - takes MVI, Ca - nighttime  Last TFTs: Lab Results  Component Value Date   TSH 3.17 08/30/2015   TSH 0.25 (L) 07/16/2015   TSH 0.30 (L) 05/27/2015   TSH 0.08 (L) 04/15/2015   TSH 0.64 12/11/2014   FREET4 0.56 (L) 08/30/2015   FREET4 0.76 07/16/2015   FREET4 0.65 05/27/2015   FREET4 0.76 04/15/2015   FREET4 0.74 12/11/2014  01/28/2015: TSH 0.051, TT3 247.56 (60-181), free T4 1.33 (0.89-1.76)  Pt denies feeling nodules in neck, hoarseness, occasional dysphagia/no odynophagia; no  SOB with lying down.  Pt c/o: - + fatigue - no heat intolerance/cold intolerance - + weight gain >> now started to lose after she started phentermine 2 weeks ago - no tremors - no palpitations - no hyperdefecation/constipation  Pt does have a FH of thyroid ds.: aunts, sister, niece (Hashimoto's thyroiditis). No FH of thyroid cancer. No h/o radiation tx to head or neck.   No seaweed or kelp, no recent contrast studies. No steroid use. No herbal supplements.   I reviewed her chart and she also has a history of hypertension, hyperlipidemia, obesity, right shoulder pain, edema, positive ANA.  She quit smoking.   ROS: Constitutional: + weight gain, + fatigue, + subjective hypothermia, + poor sleep Eyes: no blurry vision, no xerophthalmia ENT: + sore throat, no nodules palpated in throat, + dysphagia/no odynophagia, no hoarseness Cardiovascular: no CP/SOB/palpitations/+ leg swelling Respiratory: + cough/no SOB Gastrointestinal: no N/V/D/C/acid reflux Musculoskeletal:  Both: muscle/joint aches Skin: no rashes; no hair loss Neurological: no tremors/numbness/tingling/dizziness  I reviewed pt's medications, allergies, PMH, social hx, family hx, and changes were documented in the history of present illness. Otherwise, unchanged from my initial visit note.  Past Medical History:  Diagnosis Date  . Abnormal blood chemistry   . Arthropathy   . Cellulitis   . Chest discomfort   . Disturbance of skin sensation   . Edema   . GERD (gastroesophageal  reflux disease)   . High risk medication use   . Hyperglycemia   . Hyperlipidemia   . Hypertension   . Hypopotassemia   . Hypothyroidism   . Menopause   . Obesity   . Positive ANA (antinuclear antibody)   . Shoulder pain, right   . SOB (shortness of breath)   . Thyroid nodule   . Vitamin D deficiency    No past surgical history on file. History   Social History  . Marital Status: Married    Spouse Name: N/A  . Number of Children:  0   Occupational History  . n/a   Social History Main Topics  . Smoking status: Former Research scientist (life sciences)  . Smokeless tobacco: Not on file  . Alcohol Use: 0.0 oz/week    0 Standard drinks or equivalent per week  . Drug Use: No   Social History Narrative   Occasional alcohol use: hard liquor, wine and beer   Current Outpatient Prescriptions on File Prior to Visit  Medication Sig Dispense Refill  . ARMOUR THYROID 60 MG tablet TAKE ONE TABLET BY MOUTH EVERY OTHER DAY 30 tablet 2  . ARMOUR THYROID 90 MG tablet Take 1 tablet (90 mg total) by mouth every other day. 30 tablet 1  . Cyanocobalamin (B12 LIQUID HEALTH BOOSTER PO) Take 2 drops by mouth daily.    . hydrochlorothiazide (HYDRODIURIL) 25 MG tablet Take 1 tablet (25 mg total) by mouth daily. 30 tablet 3  . irbesartan (AVAPRO) 300 MG tablet Take 0.5 tablets (150 mg total) by mouth daily. 30 tablet 3  . metoprolol succinate (TOPROL-XL) 25 MG 24 hr tablet Take 1 tablet (25 mg total) by mouth daily. 30 tablet 3  . phentermine 15 MG capsule Take 1 capsule (15 mg total) by mouth every morning. 30 capsule 1  . Cholecalciferol (VITAMIN D PO) Take by mouth.    . COD LIVER OIL PO Take by mouth. Reported on 07/16/2015    . RED YEAST RICE EXTRACT PO Take by mouth. Reported on 07/16/2015     No current facility-administered medications on file prior to visit.    No Known Allergies Family History  Problem Relation Age of Onset  . Irregular heart beat Mother   . Hypertension Father    also,  - diabetes in grandfather - Hyperlipidemia in father - Cancer in uncle - + See history of present illness  PE: BP (!) 158/88 (BP Location: Right Arm, Patient Position: Sitting)   Pulse 75   Wt 215 lb (97.5 kg)   SpO2 97%   BMI 38.09 kg/m  Body mass index is 38.09 kg/m. Wt Readings from Last 3 Encounters:  01/13/16 215 lb (97.5 kg)  12/25/15 215 lb 3.2 oz (97.6 kg)  07/16/15 200 lb (90.7 kg)   Constitutional: overweight, in NAD Eyes: PERRLA, EOMI, no  exophthalmos ENT: moist mucous membranes, no thyromegaly, no thyroid nodules palpable, no cervical lymphadenopathy Cardiovascular: RRR, No MRG, + LE swelling Respiratory: CTA B Gastrointestinal: abdomen soft, NT, ND, BS+ Musculoskeletal: no deformities, strength intact in all 4;  Skin: moist, warm, no rashes Neurological: no tremor with outstretched hands, DTR normal in all 4  ASSESSMENT: 1. L thyroid nodule - largest dimension 2.4 cm  - Thyroid ultrasound (01/23/2015): CLINICAL DATA: Thyroid nodules. Referred for biopsy.  EXAM: THYROID ULTRASOUND  TECHNIQUE: Ultrasound examination of the thyroid gland and adjacent soft tissues was performed.  COMPARISON: 10/01/2014 from [place]: Right thyroid lobe  Measurements: 31 x 7 x  8 mm. Inhomogeneous with multiple small hypoechoic nodules all less than 4 mm  Left thyroid lobe  Measurements: 32 x 810 x 9 mm. Multiple cystic/ hypoechoic nodules measured up to 5 cm maximum diameter. A (I believe 5 mm is correct!)  Isthmus  Thickness: 1.5 mm. No nodules visualized.  Lymphadenopathy  None visualized.  IMPRESSION: 1. Multiple small bilateral hypoechoic/cystic nodules. No dominant lesion was identified ; biopsy was not performed. Follow-up by clinical exam is recommended. If patient has known risk factors for thyroid carcinoma, consider follow-up ultrasound in 12 months. If patient is clinically hyperthyroid, consider nuclear medicine thyroid uptake and scan. This recommendation follows the consensus statement: Management of Thyroid Nodules Detected as [place]: Society of Radiologists in [place]. Radiology 2005; [id number].   Electronically Signed By: Lucrezia Europe M.D. On: 01/23/2015 13:17  It looks like the L thyroid nodule seen on the [place] U/S was actually an inflammatory focus which has since resolved!  2. Hypothyroidism - On Armour thyroid  PLAN: 1. L  thyroid nodule - I reviewed the report of her latest thyroid ultrasound along with the patient. I on the previous ultrasound, she had a large dominant nodule which appeared hypoechoic, size and hypoechogenicity being risk factors for malignancy. We repeated a thyroid ultrasound and the large nodule was not observed again, pointing towards the possibility of a regional inflammatory focus, which has now resolved. - We may need to repeat the thyroid ultrasound at some point in the future but no further intervention is needed for now. - no neck compression sxs  2. Hypothyroidism - She is on 90 mg alternating with 60 mg Armour thyroid >> we can switch to 60 + 15 mg daily for consistency - will recheck labs today - We discussed about taking the thyroid hormone every day, with water, >30 minutes before breakfast, separated by >4 hours from acid reflux medications, calcium, iron, multivitamins. She is taking th Armour correcly - Return in about 6 months (around 07/15/2016).  Component     Latest Ref Rng & Units 01/13/2016  TSH     0.35 - 4.50 uIU/mL 7.78 (H)  T4,Free(Direct)     0.60 - 1.60 ng/dL 0.56 (L)  Triiodothyronine,Free,Serum     2.3 - 4.2 pg/mL 3.4   TSH is high >> Will need an increase in thyroid dose >> will ask her to take 90 mg of Armour 5 out of 7 days and 60 mg in the rest of the days. We cannot switch to the consistent dose as of now. We'll repeat her TFTs in 2 months.  Philemon Kingdom, MD PhD [facility] Endocrinology

## 2016-03-11 ENCOUNTER — Other Ambulatory Visit (INDEPENDENT_AMBULATORY_CARE_PROVIDER_SITE_OTHER)

## 2016-03-11 DIAGNOSIS — E039 Hypothyroidism, unspecified: Secondary | ICD-10-CM | POA: Diagnosis not present

## 2016-03-11 LAB — T3, FREE: T3 FREE: 2.8 pg/mL (ref 2.3–4.2)

## 2016-03-11 LAB — T4, FREE: FREE T4: 0.48 ng/dL — AB (ref 0.60–1.60)

## 2016-03-11 LAB — TSH: TSH: 8.81 u[IU]/mL — AB (ref 0.35–4.50)

## 2016-03-26 ENCOUNTER — Encounter: Payer: Self-pay | Admitting: Family Medicine

## 2016-03-26 ENCOUNTER — Ambulatory Visit (INDEPENDENT_AMBULATORY_CARE_PROVIDER_SITE_OTHER): Admitting: Family Medicine

## 2016-03-26 VITALS — BP 119/69 | HR 73 | Temp 97.9°F | Ht 63.0 in | Wt 217.8 lb

## 2016-03-26 DIAGNOSIS — Z119 Encounter for screening for infectious and parasitic diseases, unspecified: Secondary | ICD-10-CM | POA: Diagnosis not present

## 2016-03-26 DIAGNOSIS — Z6837 Body mass index (BMI) 37.0-37.9, adult: Secondary | ICD-10-CM

## 2016-03-26 DIAGNOSIS — R200 Anesthesia of skin: Secondary | ICD-10-CM

## 2016-03-26 DIAGNOSIS — E6609 Other obesity due to excess calories: Secondary | ICD-10-CM | POA: Insufficient documentation

## 2016-03-26 DIAGNOSIS — R768 Other specified abnormal immunological findings in serum: Secondary | ICD-10-CM

## 2016-03-26 DIAGNOSIS — R202 Paresthesia of skin: Secondary | ICD-10-CM | POA: Diagnosis not present

## 2016-03-26 DIAGNOSIS — I10 Essential (primary) hypertension: Secondary | ICD-10-CM

## 2016-03-26 DIAGNOSIS — E66812 Obesity, class 2: Secondary | ICD-10-CM | POA: Insufficient documentation

## 2016-03-26 MED ORDER — METOPROLOL SUCCINATE ER 25 MG PO TB24: 25.0000 mg | ORAL_TABLET | Freq: Every day | ORAL | 3 refills | Status: DC

## 2016-03-26 MED ORDER — IRBESARTAN 300 MG PO TABS: 150.0000 mg | ORAL_TABLET | Freq: Every day | ORAL | 3 refills | Status: DC

## 2016-03-26 MED ORDER — HYDROCHLOROTHIAZIDE 25 MG PO TABS: 25.0000 mg | ORAL_TABLET | Freq: Every day | ORAL | 3 refills | Status: DC

## 2016-03-26 NOTE — Progress Notes (Signed)
[place] at [facility] [address] [place], [place] [address] [phone]  Date:  03/26/2016   Name:  Destiny [name]   DOB:  01-29-1951   MRN:  [id number]  PCP:  Lamar Blinks, MD    Chief Complaint: Follow-up (Pt here for 2-3 month f/u. )   History of Present Illness:  Destiny [name] is a 65 y.o. very pleasant female patient who presents with the following:  Here today for a 3 month follow-up visit.  We started her on phentermine in July.    Wt Readings from Last 3 Encounters:  03/26/16 217 lb 12.8 oz (98.8 kg)  01/13/16 215 lb (97.5 kg)  12/25/15 215 lb 3.2 oz (97.6 kg)   Declines flu shot today She does see endocrinology- her TSH was high at last 2 visits and they are currently working on adjusting her medication dosage She ran out of phentermine but it did not seem to be helping her anyway.   She was using a medication called diethylpropion last year which did seem to help her lose weight.    Discussed her recent MRI of her neck. She does have some numbness of her right hand.  This is getting worse.  I will refer her to see NSG about her neck per her request  She notes that she is having stress incont for the last 10-12 months. Will also occur with cough or sneeze.  No dyysuria She declines a flu shot today  I had sent her information about her recent labs and MRI report, but she had not looked at these so we needed to go over them today   Patient Active Problem List   Diagnosis Date Noted  . Thyroid activity decreased 01/13/2016  . Thyroid nodule 12/11/2014    Past Medical History:  Diagnosis Date  . Abnormal blood chemistry   . Arthropathy   . Cellulitis   . Chest discomfort   . Disturbance of skin sensation   . Edema   . GERD (gastroesophageal reflux disease)   . High risk medication use   . Hyperglycemia   . Hyperlipidemia   . Hypertension   . Hypopotassemia   . Hypothyroidism   . Menopause   .  Obesity   . Positive ANA (antinuclear antibody)   . Shoulder pain, right   . SOB (shortness of breath)   . Thyroid nodule   . Vitamin D deficiency     No past surgical history on file.  Social History  Substance Use Topics  . Smoking status: Former Research scientist (life sciences)  . Smokeless tobacco: Never Used  . Alcohol use 0.0 oz/week    Family History  Problem Relation Age of Onset  . Irregular heart beat Mother   . Hypertension Father     No Known Allergies  Medication list has been reviewed and updated.  Current Outpatient Prescriptions on File Prior to Visit  Medication Sig Dispense Refill  . ARMOUR THYROID 60 MG tablet Take 1 tablet 2 out of 7 days 30 tablet 2  . ARMOUR THYROID 90 MG tablet Take 1 tablet 5 out of 7 days 60 tablet 1  . Cholecalciferol (VITAMIN D PO) Take by mouth.    . COD LIVER OIL PO Take by mouth. Reported on 07/16/2015    . Cyanocobalamin (B12 LIQUID HEALTH BOOSTER PO) Take 2 drops by mouth daily.    . hydrochlorothiazide (HYDRODIURIL) 25 MG tablet Take 1 tablet (25 mg total)  by mouth daily. 30 tablet 3  . irbesartan (AVAPRO) 300 MG tablet Take 0.5 tablets (150 mg total) by mouth daily. 30 tablet 3  . metoprolol succinate (TOPROL-XL) 25 MG 24 hr tablet Take 1 tablet (25 mg total) by mouth daily. 30 tablet 3  . phentermine 15 MG capsule Take 1 capsule (15 mg total) by mouth every morning. 30 capsule 1  . RED YEAST RICE EXTRACT PO Take by mouth. Reported on 07/16/2015     No current facility-administered medications on file prior to visit.     Review of Systems:  As per HPI- otherwise negative.   Physical Examination: Vitals:   03/26/16 1604  BP: 119/69  Pulse: 73  Temp: 97.9 F (36.6 C)   Vitals:   03/26/16 1604  Weight: 217 lb 12.8 oz (98.8 kg)  Height: 5\' 3"  (1.6 m)   Body mass index is 38.58 kg/m. Ideal Body Weight: Weight in (lb) to have BMI = 25: 140.8  GEN: WDWN, NAD, Non-toxic, A & O x 3, overweight, looks well HEENT: Atraumatic, Normocephalic.  Neck supple. No masses, No LAD. Ears and Nose: No external deformity. CV: RRR, No M/G/R. No JVD. No thrill. No extra heart sounds. PULM: CTA B, no wheezes, crackles, rhonchi. No retractions. No resp. distress. No accessory muscle use. ABD: S, NT, ND, +BS. No rebound. No HSM. EXTR: No c/c/e NEURO Normal gait.  PSYCH: Normally interactive. Conversant. Not depressed or anxious appearing.  Calm demeanor.    Assessment and Plan: Essential hypertension - Plan: hydrochlorothiazide (HYDRODIURIL) 25 MG tablet, irbesartan (AVAPRO) 300 MG tablet, metoprolol succinate (TOPROL-XL) 25 MG 24 hr tablet  Elevated antinuclear antibody (ANA) level - Plan: ANA  Screening examination for infectious disease - Plan: Hepatitis C antibody  Right arm numbness - Plan: Ambulatory referral to Neurosurgery  Class 2 obesity due to excess calories without serious comorbidity with body mass index (BMI) of 37.0 to 37.9 in adult   Here today with several concerns Refilled her BP medications today- her BP is under ok control  BP Readings from Last 3 Encounters:  03/26/16 119/69  01/13/16 (!) 158/88  12/25/15 124/72   She had an elevated ANA at last visit- will repeat today Hep C screening Her cervical MRI was mildly abnormal-  IMPRESSION: Disc bulge at C5-6 effaces the ventral thecal sac without cord deformity. Moderate bilateral foraminal narrowing is present at this level.  Shallow broad-based right paracentral protrusion C6-7 without central canal or foraminal stenosis.  She does not want to try a course of oral prednisone.  Will refer to [place]  She requests a stimulant med that she has used in the past for weight loss.  Counseled her that at this time I would prefer to get her thyroid level under control, and there is poor if any data for long term weight loss benefit for these medications.    Will plan further follow- up pending labs.   Signed Lamar Blinks, MD

## 2016-03-26 NOTE — Progress Notes (Signed)
Pre visit review using our clinic review tool, if applicable. No additional management support is needed unless otherwise documented below in the visit note. 

## 2016-03-26 NOTE — Patient Instructions (Signed)
It was good to see you today Try doing Kegel exercises- do 50- 100 squeezes daily- for about 4 weeks and see if this helps you hold your urine more easily.  If this is not helpful we can consider a medication for you We will recheck your ANA today I have made a referral for you to see a specialist about your neck.    For the time being we are not going to use diethylpropion- once your thyroid is stable we may be able to consider this medication for short term use, but there is not good evidence to support any long term weight loss benefit from this medication

## 2016-03-27 LAB — ANTI-NUCLEAR AB-TITER (ANA TITER): ANA Titer 1: 1:80 {titer} — ABNORMAL HIGH

## 2016-03-27 LAB — HEPATITIS C ANTIBODY: HCV AB: NEGATIVE

## 2016-03-27 LAB — ANA: ANA: POSITIVE — AB

## 2016-03-31 ENCOUNTER — Encounter: Payer: Self-pay | Admitting: Internal Medicine

## 2016-04-07 ENCOUNTER — Other Ambulatory Visit: Payer: Self-pay

## 2016-04-07 MED ORDER — ARMOUR THYROID 90 MG PO TABS: ORAL_TABLET | ORAL | 1 refills | Status: DC

## 2016-04-18 ENCOUNTER — Encounter: Payer: Self-pay | Admitting: Internal Medicine

## 2016-04-18 ENCOUNTER — Encounter: Payer: Self-pay | Admitting: Family Medicine

## 2016-04-24 ENCOUNTER — Telehealth: Payer: Self-pay | Admitting: Family Medicine

## 2016-04-24 NOTE — Telephone Encounter (Signed)
OK to transfer 

## 2016-04-24 NOTE — Telephone Encounter (Signed)
Pt was referred to Dr. [name] by Gwynn Burly and Ollen Barges. Pt states she originally requested Dr. [name] but she was unavailable. Pt states she called and was told we can request upon referral from existing pts. Pt has reviewed Dr. Frederik Pear bio and is very interested in changing to her care. Pt felt comfortable with Dr. Lorelei Pont but was recommended to Dr. [name]. Please advise.

## 2016-04-24 NOTE — Telephone Encounter (Signed)
I am glad to see her if she wants

## 2016-04-24 NOTE — Telephone Encounter (Signed)
Ok- I may have misunderstood. If she is wanting to change to [name] that is fine with me!

## 2016-04-24 NOTE — Telephone Encounter (Signed)
FYI - I advised pt based on mychart msg to schedule appt w/in few weeks and pt declined stating she doesn't need appt.

## 2016-04-24 NOTE — Telephone Encounter (Signed)
Ok to transfer. 

## 2016-04-24 NOTE — Telephone Encounter (Signed)
I apologize. Pt is currently listed for PCP as Dr. Lorelei Pont and wanting to change to Dr. [name] as recommended by 2 friends who are pts of Dr. Yancey Flemings.

## 2016-05-04 DIAGNOSIS — M542 Cervicalgia: Secondary | ICD-10-CM | POA: Insufficient documentation

## 2016-05-04 DIAGNOSIS — R200 Anesthesia of skin: Secondary | ICD-10-CM | POA: Insufficient documentation

## 2016-05-04 DIAGNOSIS — R202 Paresthesia of skin: Secondary | ICD-10-CM

## 2016-05-04 DIAGNOSIS — M5030 Other cervical disc degeneration, unspecified cervical region: Secondary | ICD-10-CM | POA: Insufficient documentation

## 2016-05-04 DIAGNOSIS — M4802 Spinal stenosis, cervical region: Secondary | ICD-10-CM | POA: Insufficient documentation

## 2016-05-11 ENCOUNTER — Other Ambulatory Visit (INDEPENDENT_AMBULATORY_CARE_PROVIDER_SITE_OTHER)

## 2016-05-11 DIAGNOSIS — E039 Hypothyroidism, unspecified: Secondary | ICD-10-CM

## 2016-05-11 LAB — T3, FREE: T3, Free: 3.1 pg/mL (ref 2.3–4.2)

## 2016-05-11 LAB — T4, FREE: Free T4: 0.55 ng/dL — ABNORMAL LOW (ref 0.60–1.60)

## 2016-05-11 LAB — TSH: TSH: 5.1 u[IU]/mL — AB (ref 0.35–4.50)

## 2016-06-12 ENCOUNTER — Encounter: Payer: Self-pay | Admitting: Behavioral Health

## 2016-06-12 ENCOUNTER — Telehealth: Payer: Self-pay | Admitting: Behavioral Health

## 2016-06-12 NOTE — Telephone Encounter (Signed)
Pre-Visit Call completed with patient and chart updated.   Pre-Visit Info documented in Specialty Comments under [employer].    

## 2016-06-15 ENCOUNTER — Ambulatory Visit (INDEPENDENT_AMBULATORY_CARE_PROVIDER_SITE_OTHER): Admitting: Family Medicine

## 2016-06-15 ENCOUNTER — Encounter: Payer: Self-pay | Admitting: Family Medicine

## 2016-06-15 VITALS — BP 120/57 | HR 77 | Temp 98.0°F | Ht 63.0 in | Wt 226.4 lb

## 2016-06-15 DIAGNOSIS — E079 Disorder of thyroid, unspecified: Secondary | ICD-10-CM

## 2016-06-15 DIAGNOSIS — K449 Diaphragmatic hernia without obstruction or gangrene: Secondary | ICD-10-CM

## 2016-06-15 DIAGNOSIS — I10 Essential (primary) hypertension: Secondary | ICD-10-CM

## 2016-06-15 DIAGNOSIS — E782 Mixed hyperlipidemia: Secondary | ICD-10-CM

## 2016-06-15 DIAGNOSIS — Z0000 Encounter for general adult medical examination without abnormal findings: Secondary | ICD-10-CM

## 2016-06-15 DIAGNOSIS — K219 Gastro-esophageal reflux disease without esophagitis: Secondary | ICD-10-CM

## 2016-06-15 DIAGNOSIS — Z1239 Encounter for other screening for malignant neoplasm of breast: Secondary | ICD-10-CM

## 2016-06-15 DIAGNOSIS — R490 Dysphonia: Secondary | ICD-10-CM | POA: Diagnosis not present

## 2016-06-15 DIAGNOSIS — Z1231 Encounter for screening mammogram for malignant neoplasm of breast: Secondary | ICD-10-CM

## 2016-06-15 HISTORY — DX: Mixed hyperlipidemia: E78.2

## 2016-06-15 HISTORY — DX: Dysphonia: R49.0

## 2016-06-15 NOTE — Assessment & Plan Note (Addendum)
On Armour Thyroid 90 daily. Will monitor thyroid functions

## 2016-06-15 NOTE — Patient Instructions (Addendum)
Lidocaine patches icy hot, aspercreme or salon pas  Preventive Care 40-64 Years, Female Preventive care refers to lifestyle choices and visits with your health care provider that can promote health and wellness. What does preventive care include?  A yearly physical exam. This is also called an annual well check.  Dental exams once or twice a year.  Routine eye exams. Ask your health care provider how often you should have your eyes checked.  Personal lifestyle choices, including:  Daily care of your teeth and gums.  Regular physical activity.  Eating a healthy diet.  Avoiding tobacco and drug use.  Limiting alcohol use.  Practicing safe sex.  Taking low-dose aspirin daily starting at age 67.  Taking vitamin and mineral supplements as recommended by your health care provider. What happens during an annual well check? The services and screenings done by your health care provider during your annual well check will depend on your age, overall health, lifestyle risk factors, and family history of disease. Counseling  Your health care provider may ask you questions about your:  Alcohol use.  Tobacco use.  Drug use.  Emotional well-being.  Home and relationship well-being.  Sexual activity.  Eating habits.  Work and work Astronomer.  Method of birth control.  Menstrual cycle.  Pregnancy history. Screening  You may have the following tests or measurements:  Height, weight, and BMI.  Blood pressure.  Lipid and cholesterol levels. These may be checked every 5 years, or more frequently if you are over 34 years old.  Skin check.  Lung cancer screening. You may have this screening every year starting at age 22 if you have a 30-pack-year history of smoking and currently smoke or have quit within the past 15 years.  Fecal occult blood test (FOBT) of the stool. You may have this test every year starting at age 39.  Flexible sigmoidoscopy or colonoscopy. You may  have a sigmoidoscopy every 5 years or a colonoscopy every 10 years starting at age 16.  Hepatitis C blood test.  Hepatitis B blood test.  Sexually transmitted disease (STD) testing.  Diabetes screening. This is done by checking your blood sugar (glucose) after you have not eaten for a while (fasting). You may have this done every 1-3 years.  Mammogram. This may be done every 1-2 years. Talk to your health care provider about when you should start having regular mammograms. This may depend on whether you have a family history of breast cancer.  BRCA-related cancer screening. This may be done if you have a family history of breast, ovarian, tubal, or peritoneal cancers.  Pelvic exam and Pap test. This may be done every 3 years starting at age 69. Starting at age 30, this may be done every 5 years if you have a Pap test in combination with an HPV test.  Bone density scan. This is done to screen for osteoporosis. You may have this scan if you are at high risk for osteoporosis. Discuss your test results, treatment options, and if necessary, the need for more tests with your health care provider. Vaccines  Your health care provider may recommend certain vaccines, such as:  Influenza vaccine. This is recommended every year.  Tetanus, diphtheria, and acellular pertussis (Tdap, Td) vaccine. You may need a Td booster every 10 years.  Varicella vaccine. You may need this if you have not been vaccinated.  Zoster vaccine. You may need this after age 64.  Measles, mumps, and rubella (MMR) vaccine. You may need at  least one dose of MMR if you were born in 1957 or later. You may also need a second dose.  Pneumococcal 13-valent conjugate (PCV13) vaccine. You may need this if you have certain conditions and were not previously vaccinated.  Pneumococcal polysaccharide (PPSV23) vaccine. You may need one or two doses if you smoke cigarettes or if you have certain conditions.  Meningococcal vaccine. You  may need this if you have certain conditions.  Hepatitis A vaccine. You may need this if you have certain conditions or if you travel or work in places where you may be exposed to hepatitis A.  Hepatitis B vaccine. You may need this if you have certain conditions or if you travel or work in places where you may be exposed to hepatitis B.  Haemophilus influenzae type b (Hib) vaccine. You may need this if you have certain conditions. Talk to your health care provider about which screenings and vaccines you need and how often you need them. This information is not intended to replace advice given to you by your health care provider. Make sure you discuss any questions you have with your health care provider. Document Released: 06/21/2015 Document Revised: 02/12/2016 Document Reviewed: 03/26/2015 Elsevier Interactive Patient Education  2017 [employer].

## 2016-06-15 NOTE — Assessment & Plan Note (Addendum)
Patient refuses to take statins. encouraged heart healthy diet, avoid trans fats, minimize simple carbs and saturated fats. Increase exercise as tolerated

## 2016-06-15 NOTE — Progress Notes (Signed)
Pre visit review using our clinic review tool, if applicable. No additional management support is needed unless otherwise documented below in the visit note. 

## 2016-06-15 NOTE — Assessment & Plan Note (Signed)
Well controlled, no changes to meds. Encouraged heart healthy diet such as the DASH diet and exercise as tolerated.  °

## 2016-06-16 LAB — COMPREHENSIVE METABOLIC PANEL
ALT: 28 U/L (ref 0–35)
AST: 23 U/L (ref 0–37)
Albumin: 4.4 g/dL (ref 3.5–5.2)
Alkaline Phosphatase: 67 U/L (ref 39–117)
BUN: 17 mg/dL (ref 6–23)
CHLORIDE: 101 meq/L (ref 96–112)
CO2: 27 meq/L (ref 19–32)
Calcium: 9.9 mg/dL (ref 8.4–10.5)
Creatinine, Ser: 1.08 mg/dL (ref 0.40–1.20)
GFR: 54.33 mL/min — AB (ref >60.00)
GLUCOSE: 97 mg/dL (ref 70–99)
Potassium: 4.4 mEq/L (ref 3.5–5.1)
Sodium: 137 mEq/L (ref 135–145)
Total Bilirubin: 0.4 mg/dL (ref 0.2–1.2)
Total Protein: 7.6 g/dL (ref 6.0–8.3)

## 2016-06-16 LAB — CBC
HCT: 41.8 % (ref 36.0–46.0)
HEMOGLOBIN: 14.5 g/dL (ref 12.0–15.0)
MCHC: 34.6 g/dL (ref 30.0–36.0)
MCV: 88.5 fl (ref 78.0–100.0)
PLATELETS: 292 10*3/uL (ref 150.0–400.0)
RBC: 4.73 Mil/uL (ref 3.87–5.11)
RDW: 13.3 % (ref 11.5–15.5)
WBC: 5.4 10*3/uL (ref 4.0–10.5)

## 2016-06-16 LAB — LIPID PANEL
CHOL/HDL RATIO: 6
Cholesterol: 273 mg/dL — ABNORMAL HIGH (ref 0–200)
HDL: 43.2 mg/dL (ref >39.00)
NONHDL: 229.76
TRIGLYCERIDES: 285 mg/dL — AB (ref 0.0–149.0)
VLDL: 57 mg/dL — ABNORMAL HIGH (ref 0.0–40.0)

## 2016-06-16 LAB — LDL CHOLESTEROL, DIRECT: Direct LDL: 177 mg/dL

## 2016-06-17 ENCOUNTER — Encounter: Payer: Self-pay | Admitting: Family Medicine

## 2016-06-17 DIAGNOSIS — Z0000 Encounter for general adult medical examination without abnormal findings: Secondary | ICD-10-CM

## 2016-06-17 DIAGNOSIS — K449 Diaphragmatic hernia without obstruction or gangrene: Secondary | ICD-10-CM

## 2016-06-17 DIAGNOSIS — K219 Gastro-esophageal reflux disease without esophagitis: Secondary | ICD-10-CM

## 2016-06-17 HISTORY — DX: Diaphragmatic hernia without obstruction or gangrene: K21.9

## 2016-06-17 HISTORY — DX: Encounter for general adult medical examination without abnormal findings: Z00.00

## 2016-06-17 NOTE — Assessment & Plan Note (Addendum)
MGM ordered today, sees [facility] for GYN care

## 2016-06-17 NOTE — Assessment & Plan Note (Signed)
Avoid offending foods, start probiotics. Do not eat large meals in late evening and consider raising head of bed.  

## 2016-06-17 NOTE — Progress Notes (Signed)
Patient ID: Destiny [name], female   DOB: December 28, 1950, 66 y.o.   MRN: [id number]   Subjective:    Patient ID: Destiny [name], female    DOB: [birth year], 66 y.o.   MRN: [id number]  Chief Complaint  Patient presents with  . Follow-up    HPI Patient is in today for follow up on chronic concerns and to establish care with this physician. She reports being in her baseline state of health. She has a long history of intermitently reflux symptoms secondary to a hiatal hernia. No nausea or vomiting. No burning but she does note some hoarseness. She is noting fatigue but no other acute concerns or recent hospitalizaiton. Denies CP/palp/SOB/HA/congestion/fevers or GU c/o. Taking meds as prescribed  Past Medical History:  Diagnosis Date  . Abnormal blood chemistry   . Arthropathy   . Cellulitis   . Chest discomfort   . Disturbance of skin sensation   . Edema   . Encounter for screening and preventative care 06/17/2016  . GERD (gastroesophageal reflux disease)   . Hiatal hernia with gastroesophageal reflux 06/17/2016  . High risk medication use   . Hoarseness 06/15/2016  . Hyperglycemia   . Hyperlipidemia   . Hyperlipidemia, mixed 06/15/2016  . Hypertension   . Hypopotassemia   . Hypothyroidism   . Menopause   . Obesity   . Positive ANA (antinuclear antibody)   . Shoulder pain, right   . SOB (shortness of breath)   . Thyroid disease 12/11/2014  . Thyroid nodule   . Vitamin D deficiency     Past Surgical History:  Procedure Laterality Date  . LIPOSUCTION    . WISDOM TOOTH EXTRACTION     and all upper teeth    Family History  Problem Relation Age of Onset  . Irregular heart beat Mother   . Heart disease Mother   . Rheumatic fever Mother   . Hypertension Father   . Ulcers Father   . Vision loss Brother   . Stroke Maternal Grandmother   . Diabetes Maternal Grandmother   . Hypertension Paternal Grandmother   . Ulcers Paternal Grandfather     Social History   Social History  .  Marital status: Married    Spouse name: N/A  . Number of children: N/A  . Years of education: N/A   Occupational History  . Not on file.   Social History Main Topics  . Smoking status: Former Research scientist (life sciences)  . Smokeless tobacco: Never Used  . Alcohol use 0.0 oz/week  . Drug use: No  . Sexual activity: Not on file   Other Topics Concern  . Not on file   Social History Narrative   Occasional alcohol use: hard liquor, wine and beer    Outpatient Medications Prior to Visit  Medication Sig Dispense Refill  . ARMOUR THYROID 90 MG tablet Take 1 tablet daily. 60 tablet 1  . Cholecalciferol (VITAMIN D PO) Take by mouth.    . COD LIVER OIL PO Take by mouth. Reported on 07/16/2015    . Cyanocobalamin (B12 LIQUID HEALTH BOOSTER PO) Take 2 drops by mouth daily.    . irbesartan (AVAPRO) 300 MG tablet Take 0.5 tablets (150 mg total) by mouth daily. 45 tablet 3  . metoprolol succinate (TOPROL-XL) 25 MG 24 hr tablet Take 1 tablet (25 mg total) by mouth daily. 90 tablet 3  . RED YEAST RICE EXTRACT PO Take by mouth. Reported on 07/16/2015    . phentermine 15 MG capsule Take 1  capsule (15 mg total) by mouth every morning. 30 capsule 1  . hydrochlorothiazide (HYDRODIURIL) 25 MG tablet Take 1 tablet (25 mg total) by mouth daily. (Patient not taking: Reported on 06/15/2016) 90 tablet 3   No facility-administered medications prior to visit.     Allergies  Allergen Reactions  . Statins     Review of Systems  Constitutional: Negative for fever and malaise/fatigue.  HENT: Positive for sore throat. Negative for congestion.   Eyes: Negative for blurred vision.  Respiratory: Negative for shortness of breath.   Cardiovascular: Negative for chest pain, palpitations and leg swelling.  Gastrointestinal: Positive for heartburn. Negative for abdominal pain, blood in stool and nausea.  Genitourinary: Negative for dysuria and frequency.  Musculoskeletal: Negative for falls.  Skin: Negative for rash.  Neurological:  Negative for dizziness, loss of consciousness and headaches.  Endo/Heme/Allergies: Negative for environmental allergies.  Psychiatric/Behavioral: Negative for depression. The patient is not nervous/anxious.        Objective:    Physical Exam  Constitutional: She is oriented to person, place, and time. She appears well-developed and well-nourished. No distress.  HENT:  Head: Normocephalic and atraumatic.  Nose: Nose normal.  Oropharynx erythematous  Eyes: Right eye exhibits no discharge. Left eye exhibits no discharge.  Neck: Normal range of motion. Neck supple.  Cardiovascular: Normal rate and regular rhythm.   No murmur heard. Pulmonary/Chest: Effort normal and breath sounds normal.  Abdominal: Soft. Bowel sounds are normal. There is no tenderness.  Musculoskeletal: She exhibits no edema.  Neurological: She is alert and oriented to person, place, and time.  Skin: Skin is warm and dry.  Psychiatric: She has a normal mood and affect.  Nursing note and vitals reviewed.   BP (!) 120/57 (BP Location: Left Arm, Patient Position: Sitting, Cuff Size: Normal)   Pulse 77   Temp 98 F (36.7 C) (Oral)   Ht 5\' 3"  (1.6 m)   Wt 226 lb 6.4 oz (102.7 kg)   SpO2 98%   BMI 40.10 kg/m  Wt Readings from Last 3 Encounters:  06/15/16 226 lb 6.4 oz (102.7 kg)  03/26/16 217 lb 12.8 oz (98.8 kg)  01/13/16 215 lb (97.5 kg)     Lab Results  Component Value Date   WBC 5.4 06/15/2016   HGB 14.5 06/15/2016   HCT 41.8 06/15/2016   PLT 292.0 06/15/2016   GLUCOSE 97 06/15/2016   CHOL 273 (H) 06/15/2016   TRIG 285.0 (H) 06/15/2016   HDL 43.20 06/15/2016   LDLDIRECT 177.0 06/15/2016   ALT 28 06/15/2016   AST 23 06/15/2016   NA 137 06/15/2016   K 4.4 06/15/2016   CL 101 06/15/2016   CREATININE 1.08 06/15/2016   BUN 17 06/15/2016   CO2 27 06/15/2016   TSH 5.10 (H) 05/11/2016    Lab Results  Component Value Date   TSH 5.10 (H) 05/11/2016   Lab Results  Component Value Date   WBC 5.4  06/15/2016   HGB 14.5 06/15/2016   HCT 41.8 06/15/2016   MCV 88.5 06/15/2016   PLT 292.0 06/15/2016   Lab Results  Component Value Date   NA 137 06/15/2016   K 4.4 06/15/2016   CO2 27 06/15/2016   GLUCOSE 97 06/15/2016   BUN 17 06/15/2016   CREATININE 1.08 06/15/2016   BILITOT 0.4 06/15/2016   ALKPHOS 67 06/15/2016   AST 23 06/15/2016   ALT 28 06/15/2016   PROT 7.6 06/15/2016   ALBUMIN 4.4 06/15/2016   CALCIUM 9.9  06/15/2016   GFR 54.33 (L) 06/15/2016   Lab Results  Component Value Date   CHOL 273 (H) 06/15/2016   Lab Results  Component Value Date   HDL 43.20 06/15/2016   No results found for: [facility] Lab Results  Component Value Date   TRIG 285.0 (H) 06/15/2016   Lab Results  Component Value Date   CHOLHDL 6 06/15/2016   No results found for: HGBA1C     Assessment & Plan:   Problem List Items Addressed This Visit    Thyroid disease    On Armour Thyroid 90 daily. Will monitor thyroid functions      Essential hypertension    Well controlled, no changes to meds. Encouraged heart healthy diet such as the DASH diet and exercise as tolerated.       Relevant Orders   Lipid panel (Completed)   Comprehensive metabolic panel (Completed)   CBC (Completed)   Hoarseness   Hyperlipidemia, mixed    Patient refuses to take statins. encouraged heart healthy diet, avoid trans fats, minimize simple carbs and saturated fats. Increase exercise as tolerated      Encounter for screening and preventative care    MGM ordered today, sees [facility] for GYN care       Hiatal hernia with gastroesophageal reflux    Avoid offending foods, start probiotics. Do not eat large meals in late evening and consider raising head of bed.        Other Visit Diagnoses    Breast cancer screening    -  Primary   Relevant Orders   MM SCREENING BREAST TOMO BILATERAL      I have discontinued Ms. Bluestein's phentermine and hydrochlorothiazide. I am also having her maintain her  Cholecalciferol (VITAMIN D PO), COD LIVER OIL PO, RED YEAST RICE EXTRACT PO, Cyanocobalamin (B12 LIQUID HEALTH BOOSTER PO), irbesartan, metoprolol succinate, and ARMOUR THYROID.  No orders of the defined types were placed in this encounter.     Penni Homans, MD

## 2016-07-07 ENCOUNTER — Telehealth: Payer: Self-pay | Admitting: Family Medicine

## 2016-07-07 NOTE — Telephone Encounter (Signed)
Encouraged increased rest and hydration, add probiotics, zinc such as Coldeze or Xicam. Treat fevers as needed. Vitamin C 500 to 1000 MG daily, Aged or black garlic daily, elderberry liquid can help the sore throat and help to fight viruses. Also warm tea with lemon and honey for the cough

## 2016-07-07 NOTE — Telephone Encounter (Signed)
Relation to [id number] Call back number:[phone]   Reason for call:  Sore throat for 5x, coughing with low grade fever, patient states she would like to speak with nurse first before scheduling appointment.

## 2016-07-07 NOTE — Telephone Encounter (Signed)
Called the patient informed of all PCP instructions.

## 2016-07-07 NOTE — Telephone Encounter (Signed)
Patient has had sore throat for 5 days, sneezing, temp daily at 97, some cough--not every day.  Mostly sore throat  And a lot of congestion.  Patient would like OTC alternatives.  [facility]

## 2016-07-16 ENCOUNTER — Ambulatory Visit (INDEPENDENT_AMBULATORY_CARE_PROVIDER_SITE_OTHER): Admitting: Internal Medicine

## 2016-07-16 ENCOUNTER — Encounter: Payer: Self-pay | Admitting: Internal Medicine

## 2016-07-16 VITALS — BP 140/80 | HR 87 | Wt 224.0 lb

## 2016-07-16 DIAGNOSIS — E039 Hypothyroidism, unspecified: Secondary | ICD-10-CM | POA: Diagnosis not present

## 2016-07-16 LAB — T4, FREE: Free T4: 0.56 ng/dL — ABNORMAL LOW (ref 0.60–1.60)

## 2016-07-16 LAB — T3, FREE: T3, Free: 3.5 pg/mL (ref 2.3–4.2)

## 2016-07-16 LAB — TSH: TSH: 2.86 u[IU]/mL (ref 0.35–4.50)

## 2016-07-16 NOTE — Progress Notes (Signed)
Patient ID: Vidal Schwalbe, female   DOB: 07-22-1950, 65 y.o.   MRN: [id number]   HPI  Gearline Hitchman Bachtel is a 65 y.o.-year-old female, returning for follow-up for hypothyroidism. Last visit 6 mo ago.  She has a URI.  Since last visit, she stopped HCTZ.  Patient also has hypothyroidism diagnosed in the 1980s. She started on Synthroid >> delusions >> started Armour years ago.   She is taking Armour 90 alternating with 60 mg daily >> then we switched to 90 mg of Armour 5/7 days and 60 mg in 2/7 days in 01/2016 >> 90 mg daily in 03/2016.  She takes Armour: - In a.m. (5 am, when wakes up to go to the restroom) - Fasting, eats b'fast >30 min later - With water   - takes prn PPIs later in the day - no iron, MVI, Ca   Her TSH was still high despite increasing the Armour dose: Lab Results  Component Value Date   TSH 5.10 (H) 05/11/2016   TSH 8.81 (H) 03/11/2016   TSH 7.78 (H) 01/13/2016   TSH 3.17 08/30/2015   TSH 0.25 (L) 07/16/2015   TSH 0.30 (L) 05/27/2015   FREET4 0.55 (L) 05/11/2016   FREET4 0.48 (L) 03/11/2016   FREET4 0.56 (L) 01/13/2016   FREET4 0.56 (L) 08/30/2015   FREET4 0.76 07/16/2015   FREET4 0.65 05/27/2015  01/28/2015: TSH 0.051, TT3 247.56 (60-181), free T4 1.33 (0.89-1.76)  Pt denies feeling nodules in neck, hoarseness, occasional dysphagia/no odynophagia; no SOB with lying down.  Pt c/o: - + fatigue - no heat intolerance/cold intolerance - + weight gain  - no tremors - no palpitations - + both: hyperdefecation/constipation  Pt does have a FH of thyroid ds.: aunts, sister, niece (Hashimoto's thyroiditis). No FH of thyroid cancer. No h/o radiation tx to head or neck.   No seaweed or kelp, no recent contrast studies. No steroid use. No herbal supplements.   Patient had a thyroid nodule that was incidentally found in 09/2014. She had an initial thyroid ultrasound in 09/2014 that showed a large, 2.4 cm nodule. At last visit I referred her to for biopsy of the  nodule, however, a new thyroid ultrasound did not show the nodule. No biopsy was performed during the session.  Thyroid U/S [facility], 10/01/2014):  Right lobe: 3.3 x 1.0 x 0.6 cm: 5 mm upper pole nodule, 5 mm mid pole nodule and 6 mm lower pole nodule  Left lobe: 3.9 x 0.9 x 1.1 cm. 2.4 x 0.4 x 0.5 cm hypoechoic midpole nodule extending into upper and lower poles.  Isthmus: 0.8 cm  Thyroid U/S (01/23/2015): Multiple small bilateral hypoechoic/cystic nodules. No dominant lesion was identified; biopsy was not performed.   She denies neck compression symptoms.  ROS: Constitutional: + weight gain, + fatigue, no subjective hypothermia Eyes: no blurry vision, no xerophthalmia ENT: + sore throat, no nodules palpated in throat, no dysphagia/no odynophagia, no hoarseness Cardiovascular: no CP/SOB/palpitations/leg swelling Respiratory: + cough/no SOB Gastrointestinal: no N/V/+ D/+ C/+ acid reflux Musculoskeletal:  + muscle/+ joint aches Skin: no rashes; no hair loss Neurological: no tremors/numbness/tingling/dizziness, + HA  I reviewed pt's medications, allergies, PMH, social hx, family hx, and changes were documented in the history of present illness. Otherwise, unchanged from my initial visit note.  Past Medical History:  Diagnosis Date  . Abnormal blood chemistry   . Arthropathy   . Cellulitis   . Chest discomfort   . Disturbance of skin sensation   .  Edema   . Encounter for screening and preventative care 06/17/2016  . GERD (gastroesophageal reflux disease)   . Hiatal hernia with gastroesophageal reflux 06/17/2016  . High risk medication use   . Hoarseness 06/15/2016  . Hyperglycemia   . Hyperlipidemia   . Hyperlipidemia, mixed 06/15/2016  . Hypertension   . Hypopotassemia   . Hypothyroidism   . Menopause   . Obesity   . Positive ANA (antinuclear antibody)   . Shoulder pain, right   . SOB (shortness of breath)   . Thyroid disease 12/11/2014  . Thyroid nodule   .  Vitamin D deficiency    Past Surgical History:  Procedure Laterality Date  . LIPOSUCTION    . WISDOM TOOTH EXTRACTION     and all upper teeth   History   Social History  . Marital Status: Married    Spouse Name: N/A  . Number of Children: 0   Occupational History  . n/a   Social History Main Topics  . Smoking status: Former Research scientist (life sciences)  . Smokeless tobacco: Not on file  . Alcohol Use: 0.0 oz/week    0 Standard drinks or equivalent per week  . Drug Use: No   Social History Narrative   Occasional alcohol use: hard liquor, wine and beer   Current Outpatient Prescriptions on File Prior to Visit  Medication Sig Dispense Refill  . ARMOUR THYROID 90 MG tablet Take 1 tablet daily. 60 tablet 1  . Cholecalciferol (VITAMIN D PO) Take by mouth.    . COD LIVER OIL PO Take by mouth. Reported on 07/16/2015    . Cyanocobalamin (B12 LIQUID HEALTH BOOSTER PO) Take 2 drops by mouth daily.    . irbesartan (AVAPRO) 300 MG tablet Take 0.5 tablets (150 mg total) by mouth daily. 45 tablet 3  . metoprolol succinate (TOPROL-XL) 25 MG 24 hr tablet Take 1 tablet (25 mg total) by mouth daily. 90 tablet 3  . RED YEAST RICE EXTRACT PO Take by mouth. Reported on 07/16/2015     No current facility-administered medications on file prior to visit.    Allergies  Allergen Reactions  . Statins    Family History  Problem Relation Age of Onset  . Irregular heart beat Mother   . Heart disease Mother   . Rheumatic fever Mother   . Hypertension Father   . Ulcers Father   . Vision loss Brother   . Stroke Maternal Grandmother   . Diabetes Maternal Grandmother   . Hypertension Paternal Grandmother   . Ulcers Paternal Grandfather    also,  - diabetes in grandfather - Hyperlipidemia in father - Cancer in uncle - + See history of present illness  PE: BP 140/80 (BP Location: Left Arm, Patient Position: Sitting)   Pulse 87   Wt 224 lb (101.6 kg)   SpO2 96%   BMI 39.68 kg/m  Body mass index is 39.68  kg/m. Wt Readings from Last 3 Encounters:  07/16/16 224 lb (101.6 kg)  06/15/16 226 lb 6.4 oz (102.7 kg)  03/26/16 217 lb 12.8 oz (98.8 kg)   Constitutional: overweight, in NAD Eyes: PERRLA, EOMI, no exophthalmos ENT: moist mucous membranes, no thyromegaly, no thyroid nodules palpable, no cervical lymphadenopathy Cardiovascular: RRR, No MRG, no LE swelling Respiratory: CTA B Gastrointestinal: abdomen soft, NT, ND, BS+ Musculoskeletal: no deformities, strength intact in all 4;  Skin: dry, warm, no rashes Neurological: no tremor with outstretched hands, DTR normal in all 4  ASSESSMENT:  1. Hypothyroidism - On Armour  thyroid  2. H/o L thyroid nodule  - largest dimension 2.4 cm  - Thyroid ultrasound (01/23/2015): CLINICAL DATA: Thyroid nodules. Referred for biopsy.  EXAM: THYROID ULTRASOUND  TECHNIQUE: Ultrasound examination of the thyroid gland and adjacent soft tissues was performed.  COMPARISON: 10/01/2014 from [place]: Right thyroid lobe  Measurements: 31 x 7 x 8 mm. Inhomogeneous with multiple small hypoechoic nodules all less than 4 mm  Left thyroid lobe  Measurements: 32 x 810 x 9 mm. Multiple cystic/ hypoechoic nodules measured up to 5 cm maximum diameter. A (I believe 5 mm is correct!)  Isthmus  Thickness: 1.5 mm. No nodules visualized.  Lymphadenopathy  None visualized.  IMPRESSION: 1. Multiple small bilateral hypoechoic/cystic nodules. No dominant lesion was identified ; biopsy was not performed. Follow-up by clinical exam is recommended. If patient has known risk factors for thyroid carcinoma, consider follow-up ultrasound in 12 months. If patient is clinically hyperthyroid, consider nuclear medicine thyroid uptake and scan. This recommendation follows the consensus statement: Management of Thyroid Nodules Detected as [place]: Society of Radiologists in [place] [employer] [place]. Radiology 2005;  [id number].   Electronically Signed By: Lucrezia Europe M.D. On: 01/23/2015 13:17  It looks like the L thyroid nodule seen on the [facility] U/S was actually an inflammatory focus which has since resolved!  PLAN: 1. Hypothyroidism - Patient appears euthyroid, but she complains of weight gain and fatigue - She is on 90 mg Armour thyroid >> dose was last increased 03/2016 - will recheck labs today: TSH, fT4 and fT3. We may need to increase her dose to 90 + 15 mg Armour daily. - We discussed about taking the thyroid hormone every day, with water, >30 minutes before breakfast, separated by >4 hours from acid reflux medications, calcium, iron, multivitamins. She is taking th Armour correctly.  2. L thyroid nodule - I reviewed the report of her latest thyroid ultrasound >> the nodule was most likely an inflammatory focus, which has now resolved. - We may need to repeat the thyroid ultrasound at some point in the future but no further intervention is needed for now. - no neck compression sxs - RTC in 6 mo  Component     Latest Ref Rng & Units 07/16/2016  TSH     0.35 - 4.50 uIU/mL 2.86  T4,Free(Direct)     0.60 - 1.60 ng/dL 0.56 (L)  Triiodothyronine,Free,Serum     2.3 - 4.2 pg/mL 3.5  TSH is finally normal.  Philemon Kingdom, MD PhD [facility]

## 2016-07-16 NOTE — Patient Instructions (Addendum)
Please continue Armour 90 mg every day.  Take the thyroid hormone every day, with water, at least 30 minutes before breakfast, separated by at least 4 hours from: - acid reflux medications - calcium - iron - multivitamins  Please come back for a follow-up appointment in 6 months.

## 2016-07-17 MED ORDER — ARMOUR THYROID 90 MG PO TABS: ORAL_TABLET | ORAL | 3 refills | Status: DC

## 2016-07-23 ENCOUNTER — Ambulatory Visit (HOSPITAL_BASED_OUTPATIENT_CLINIC_OR_DEPARTMENT_OTHER)
Admission: RE | Admit: 2016-07-23 | Discharge: 2016-07-23 | Disposition: A | Discharge status: Home / Self Care | Source: Ambulatory Visit | Attending: Family Medicine | Admitting: Family Medicine

## 2016-07-23 ENCOUNTER — Encounter (HOSPITAL_BASED_OUTPATIENT_CLINIC_OR_DEPARTMENT_OTHER): Payer: Self-pay

## 2016-07-23 DIAGNOSIS — Z1231 Encounter for screening mammogram for malignant neoplasm of breast: Secondary | ICD-10-CM | POA: Diagnosis not present

## 2016-07-23 DIAGNOSIS — Z1239 Encounter for other screening for malignant neoplasm of breast: Secondary | ICD-10-CM

## 2016-07-23 HISTORY — DX: Solitary cyst of unspecified breast: N60.09

## 2016-08-12 ENCOUNTER — Ambulatory Visit (HOSPITAL_BASED_OUTPATIENT_CLINIC_OR_DEPARTMENT_OTHER)
Admission: RE | Admit: 2016-08-12 | Discharge: 2016-08-12 | Disposition: A | Discharge status: Home / Self Care | Source: Ambulatory Visit | Attending: Medical | Admitting: Medical

## 2016-08-12 ENCOUNTER — Ambulatory Visit (INDEPENDENT_AMBULATORY_CARE_PROVIDER_SITE_OTHER): Admitting: Medical

## 2016-08-12 ENCOUNTER — Encounter: Payer: Self-pay | Admitting: Medical

## 2016-08-12 VITALS — BP 134/63 | HR 72 | Temp 98.4°F | Ht 63.0 in | Wt 226.2 lb

## 2016-08-12 DIAGNOSIS — M79671 Pain in right foot: Secondary | ICD-10-CM | POA: Diagnosis not present

## 2016-08-12 DIAGNOSIS — M7660 Achilles tendinitis, unspecified leg: Secondary | ICD-10-CM

## 2016-08-12 DIAGNOSIS — M25571 Pain in right ankle and joints of right foot: Secondary | ICD-10-CM

## 2016-08-12 MED ORDER — DICLOFENAC SODIUM 75 MG PO TBEC: 75.0000 mg | DELAYED_RELEASE_TABLET | Freq: Two times a day (BID) | ORAL | 0 refills | Status: DC

## 2016-08-12 NOTE — Progress Notes (Signed)
Subjective:    Patient ID: Destiny [name], female    DOB: 11/28/1950, 66 y.o.   MRN: [id number]  HPI  Pt has rt ankle pain lateral aspect and some heel pain also for 2 weeks. Pain worse on walking and putting weight on the foot. Pt states several years ago twisted the ankle and it eventually got better. But pain lateral aspect present again in same region.  Recent no injury or fall.   No excess activity.     Review of Systems  Constitutional: Negative for chills, fatigue and fever.  Respiratory: Negative for cough, chest tightness, shortness of breath and wheezing.   Cardiovascular: Negative for chest pain and palpitations.  Gastrointestinal: Negative for abdominal pain, constipation, diarrhea and nausea.       Some history of reflux.  Musculoskeletal: Negative for back pain.       Rt ankle pain.. Some heel pain.  Skin: Negative for rash.  Hematological: Negative for adenopathy. Does not bruise/bleed easily.  Psychiatric/Behavioral: Negative for behavioral problems and confusion.    Past Medical History:  Diagnosis Date  . Abnormal blood chemistry   . Arthropathy   . Breast cyst   . Cellulitis   . Chest discomfort   . Disturbance of skin sensation   . Edema   . Encounter for screening and preventative care 06/17/2016  . GERD (gastroesophageal reflux disease)   . Hiatal hernia with gastroesophageal reflux 06/17/2016  . High risk medication use   . Hoarseness 06/15/2016  . Hyperglycemia   . Hyperlipidemia   . Hyperlipidemia, mixed 06/15/2016  . Hypertension   . Hypopotassemia   . Hypothyroidism   . Menopause   . Obesity   . Positive ANA (antinuclear antibody)   . Shoulder pain, right   . SOB (shortness of breath)   . Thyroid disease 12/11/2014  . Thyroid nodule   . Vitamin D deficiency      Social History   Social History  . Marital status: Married    Spouse name: N/A  . Number of children: N/A  . Years of education: N/A   Occupational History  . Not on file.    Social History Main Topics  . Smoking status: Former Research scientist (life sciences)  . Smokeless tobacco: Never Used  . Alcohol use 0.0 oz/week  . Drug use: No  . Sexual activity: Not on file   Other Topics Concern  . Not on file   Social History Narrative   Occasional alcohol use: hard liquor, wine and beer    Past Surgical History:  Procedure Laterality Date  . BREAST CYST ASPIRATION Left    30 years ago   . LIPOSUCTION    . WISDOM TOOTH EXTRACTION     and all upper teeth    Family History  Problem Relation Age of Onset  . Irregular heart beat Mother   . Heart disease Mother   . Rheumatic fever Mother   . Hypertension Father   . Ulcers Father   . Vision loss Brother   . Stroke Maternal Grandmother   . Diabetes Maternal Grandmother   . Hypertension Paternal Grandmother   . Ulcers Paternal Grandfather     Allergies  Allergen Reactions  . Statins     Current Outpatient Prescriptions on File Prior to Visit  Medication Sig Dispense Refill  . ARMOUR THYROID 90 MG tablet Take 1 tablet daily. 90 tablet 3  . Cholecalciferol (VITAMIN D PO) Take by mouth.    . COD LIVER OIL PO  Take by mouth. Reported on 07/16/2015    . Cyanocobalamin (B12 LIQUID HEALTH BOOSTER PO) Take 2 drops by mouth daily.    . irbesartan (AVAPRO) 300 MG tablet Take 0.5 tablets (150 mg total) by mouth daily. 45 tablet 3  . metoprolol succinate (TOPROL-XL) 25 MG 24 hr tablet Take 1 tablet (25 mg total) by mouth daily. 90 tablet 3  . RED YEAST RICE EXTRACT PO Take by mouth. Reported on 07/16/2015     No current facility-administered medications on file prior to visit.     BP 134/63 (BP Location: Left Arm, Cuff Size: Large)   Pulse 72   Temp 98.4 F (36.9 C)   Ht 5\' 3"  (1.6 m)   Wt 226 lb 3.2 oz (102.6 kg)   SpO2 98%   BMI 40.07 kg/m       Objective:   Physical Exam  General- No acute distress. Pleasant patient. Neck- Full range of motion, no jvd Lungs- Clear, even and unlabored. Heart- regular rate and  rhythm. Neurologic- CNII- XII grossly intact.   Rt ankle- moderate swollen and tender rt lateral aspect. No warmth. Rt foot- mid achilles tendon area pain. And pain directly on palpation of heel.         Assessment & Plan:  For your ankle and foot pain will get xray of ankle and foot.  Rx diclofenac(rx advisement) Rice therapy and ace wrap given in office.  Follow up in 7-10 days or as needed  May refer to sports medicine if pain persists.  Jennetta Flood, Percell Miller, PA-C

## 2016-08-12 NOTE — Progress Notes (Signed)
Pre visit review using our clinic review tool, if applicable. No additional management support is needed unless otherwise documented below in the visit note. 

## 2016-08-12 NOTE — Patient Instructions (Signed)
For your ankle and foot pain will get xray of ankle and foot.  Rx diclofenac(rx advisement) Rice therapy and ace wrap given in office.  Follow up in 7-10 days or as needed

## 2016-08-13 ENCOUNTER — Ambulatory Visit: Admitting: Family Medicine

## 2016-08-24 ENCOUNTER — Telehealth: Payer: Self-pay | Admitting: Medical

## 2016-08-24 ENCOUNTER — Ambulatory Visit (INDEPENDENT_AMBULATORY_CARE_PROVIDER_SITE_OTHER): Admitting: Medical

## 2016-08-24 ENCOUNTER — Encounter: Payer: Self-pay | Admitting: Medical

## 2016-08-24 VITALS — BP 132/82 | HR 62 | Temp 98.0°F | Resp 16 | Ht 63.0 in | Wt 229.2 lb

## 2016-08-24 DIAGNOSIS — R944 Abnormal results of kidney function studies: Secondary | ICD-10-CM | POA: Diagnosis not present

## 2016-08-24 DIAGNOSIS — M79671 Pain in right foot: Secondary | ICD-10-CM

## 2016-08-24 DIAGNOSIS — R739 Hyperglycemia, unspecified: Secondary | ICD-10-CM

## 2016-08-24 LAB — COMPREHENSIVE METABOLIC PANEL
ALT: 38 U/L — AB (ref 0–35)
AST: 26 U/L (ref 0–37)
Albumin: 4.2 g/dL (ref 3.5–5.2)
Alkaline Phosphatase: 61 U/L (ref 39–117)
BILIRUBIN TOTAL: 0.4 mg/dL (ref 0.2–1.2)
BUN: 10 mg/dL (ref 6–23)
CALCIUM: 9.4 mg/dL (ref 8.4–10.5)
CHLORIDE: 101 meq/L (ref 96–112)
CO2: 26 meq/L (ref 19–32)
CREATININE: 0.83 mg/dL (ref 0.40–1.20)
GFR: 73.58 mL/min (ref >60.00)
GLUCOSE: 184 mg/dL — AB (ref 70–99)
Potassium: 3.8 mEq/L (ref 3.5–5.1)
Sodium: 137 mEq/L (ref 135–145)
Total Protein: 6.8 g/dL (ref 6.0–8.3)

## 2016-08-24 NOTE — Progress Notes (Signed)
Subjective:    Patient ID: Destiny [name], female    DOB: Oct 24, 1950, 66 y.o.   MRN: [id number]  HPI  Pt state pain her foot/heel. Pain worse in the am. Pain worse on walking. The heel is no better compared to prior viist  Pt took diclofenac. She has nausea, scratchy throat, and skin itching. Pt stopped medication 4 days ago.       Review of Systems  Constitutional: Negative for chills and fatigue.  Respiratory: Negative for cough, chest tightness, shortness of breath and wheezing.   Cardiovascular: Negative for chest pain and palpitations.  Musculoskeletal:       Rt heel pain.  Hematological: Negative for adenopathy. Does not bruise/bleed easily.    Past Medical History:  Diagnosis Date  . Abnormal blood chemistry   . Arthropathy   . Breast cyst   . Cellulitis   . Chest discomfort   . Disturbance of skin sensation   . Edema   . Encounter for screening and preventative care 06/17/2016  . GERD (gastroesophageal reflux disease)   . Hiatal hernia with gastroesophageal reflux 06/17/2016  . High risk medication use   . Hoarseness 06/15/2016  . Hyperglycemia   . Hyperlipidemia   . Hyperlipidemia, mixed 06/15/2016  . Hypertension   . Hypopotassemia   . Hypothyroidism   . Menopause   . Obesity   . Positive ANA (antinuclear antibody)   . Shoulder pain, right   . SOB (shortness of breath)   . Thyroid disease 12/11/2014  . Thyroid nodule   . Vitamin D deficiency      Social History   Social History  . Marital status: Married    Spouse name: N/A  . Number of children: N/A  . Years of education: N/A   Occupational History  . Not on file.   Social History Main Topics  . Smoking status: Former Research scientist (life sciences)  . Smokeless tobacco: Never Used  . Alcohol use 0.0 oz/week  . Drug use: No  . Sexual activity: Not on file   Other Topics Concern  . Not on file   Social History Narrative   Occasional alcohol use: hard liquor, wine and beer    Past Surgical History:  Procedure  Laterality Date  . BREAST CYST ASPIRATION Left    30 years ago   . LIPOSUCTION    . WISDOM TOOTH EXTRACTION     and all upper teeth    Family History  Problem Relation Age of Onset  . Irregular heart beat Mother   . Heart disease Mother   . Rheumatic fever Mother   . Hypertension Father   . Ulcers Father   . Vision loss Brother   . Stroke Maternal Grandmother   . Diabetes Maternal Grandmother   . Hypertension Paternal Grandmother   . Ulcers Paternal Grandfather     Allergies  Allergen Reactions  . Diclofenac Itching, Nausea Only and Other (See Comments)    Wheezing  . Statins     Current Outpatient Prescriptions on File Prior to Visit  Medication Sig Dispense Refill  . ARMOUR THYROID 90 MG tablet Take 1 tablet daily. 90 tablet 3  . Cholecalciferol (VITAMIN D PO) Take by mouth.    . COD LIVER OIL PO Take by mouth. Reported on 07/16/2015    . metoprolol succinate (TOPROL-XL) 25 MG 24 hr tablet Take 1 tablet (25 mg total) by mouth daily. 90 tablet 3  . RED YEAST RICE EXTRACT PO Take by mouth. Reported on  07/16/2015     No current facility-administered medications on file prior to visit.     BP 132/82 (BP Location: Left Arm, Patient Position: Sitting, Cuff Size: Large)   Pulse 62   Temp 98 F (36.7 C) (Oral)   Resp 16   Ht 5\' 3"  (1.6 m)   Wt 229 lb 4 oz (104 kg)   SpO2 99%   BMI 40.61 kg/m       Objective:   Physical Exam  General- No acute distress. Pleasant patient.  Rt heel-direct tenderness to palpation of heel. No lateral or medial ankle tenderness.   Rt ankle- no pain on palpation today. No swelling and no warmth.     Assessment & Plan:  For your rt heel pain, I put in referral to podiatrist. I asked Anderson Malta to call and see if you can get in tomorrow with Dr. Jacqualyn Posey or within a week.   If appointment in a week you can try Dr. Darrick Grinder heel pad. If appointment tomorrow then recommend just seeing specialist and follow his treatment  guidelines.  Recommend can use tylenol for pain.   Follow up as needed with pcp or as needed with myself.  Counseled pt not to take any nsaids as she states never takes in past and had reaction to diclofenac.  On lab review mild low gfr on prior lab. Repeat today.  Pattye Meda, Percell Miller, PA-C

## 2016-08-24 NOTE — Patient Instructions (Addendum)
For your rt heel pain, I put in referral to podiatrist. I asked Anderson Malta to call and see if you can get in tomorrow with Dr. Jacqualyn Posey or within a week.   If appointment in a week you can try Dr. Darrick Grinder heel pad. If appointment tomorrow then recommend just seeing specialist and follow his treatment guidelines.  Recommend can use tylenol for pain.   Follow up as needed with pcp or as needed with myself.

## 2016-08-24 NOTE — Telephone Encounter (Signed)
a1c future order placed. °

## 2016-08-24 NOTE — Progress Notes (Signed)
Pre visit review using our clinic review tool, if applicable. No additional management support is needed unless otherwise documented below in the visit note/SLS  

## 2016-08-27 ENCOUNTER — Telehealth: Payer: Self-pay

## 2016-09-01 ENCOUNTER — Ambulatory Visit (INDEPENDENT_AMBULATORY_CARE_PROVIDER_SITE_OTHER): Admitting: Podiatry

## 2016-09-01 ENCOUNTER — Encounter: Payer: Self-pay | Admitting: Podiatry

## 2016-09-01 VITALS — BP 147/78 | HR 67 | Resp 18

## 2016-09-01 DIAGNOSIS — M722 Plantar fascial fibromatosis: Secondary | ICD-10-CM | POA: Diagnosis not present

## 2016-09-01 MED ORDER — TRIAMCINOLONE ACETONIDE 10 MG/ML IJ SUSP
10.0000 mg | Freq: Once | INTRAMUSCULAR | Status: AC
  Administered 2016-09-01: 10 mg

## 2016-09-01 NOTE — Progress Notes (Signed)
   Subjective:    Patient ID: Destiny [name], female    DOB: Mar 28, 1951, 66 y.o.   MRN: [id number]  HPI  66 year old female presents the occipital concerns in the right heel pain which been ongoing for about 2 months. She states that she describes as a throbbing/aching pain. She states that she had ankle pain about 2 years ago but that pain did resolve. She is the pain is more to the bottom of the heel. She states that she has pain in the morning she presents for she does a lot of walking. She denies any numbness or tingling. The pain does not wake her up at night. No erythema. No other complaints at this time.   Review of Systems  All other systems reviewed and are negative.      Objective:   Physical Exam General: AAO x3, NAD  Dermatological: Skin is warm, dry and supple bilateral. Nails x 10 are well manicured; remaining integument appears unremarkable at this time. There are no open sores, no preulcerative lesions, no rash or signs of infection present.  Vascular: Dorsalis Pedis artery and Posterior Tibial artery pedal pulses are 2/4 bilateral with immedate capillary fill time. There is no pain with calf compression, swelling, warmth, erythema.   Neruologic: Grossly intact via light touch bilateral. Vibratory intact via tuning fork bilateral. Protective threshold with Semmes Wienstein monofilament intact to all pedal sites bilateral. Negative tinel sign.   Musculoskeletal: Tenderness to palpation along the plantar medial tubercle of the calcaneus at the insertion of plantar fascia on the right foot. There is no pain along the course of the plantar fascia within the arch of the foot. Plantar fascia appears to be intact. There is no pain with lateral compression of the calcaneus or pain with vibratory sensation. There is no pain along the course or insertion of the achilles tendon. No other areas of tenderness to bilateral lower extremities. Muscular strength 5/5 in all groups tested  bilateral. Equinus is present.   Gait: Unassisted, Nonantalgic.     Assessment & Plan:  66 year old female right heel pain, plantar fasciitis -Treatment options discussed including all alternatives, risks, and complications -Etiology of symptoms were discussed -Previous x-ray reviewed -Patient elects to proceed with steroid injection into the right heel. Under sterile skin preparation, a total of 2.5cc of kenalog 10, 0.5% Marcaine plain, and 2% lidocaine plain were infiltrated into the symptomatic area without complication. A band-aid was applied. Patient tolerated the injection well without complication. Post-injection care with discussed with the patient. Discussed with the patient to ice the area over the next couple of days to help prevent a steroid flare.  -Plantar fascial brace was dispensed -Night splint dispensed -She states she cannot take antiinflammatories and declines medrol dose pack.  -Stretching, icing daily. -Discussed shoe gear modifications and orthotics -Follow-up in 3 weeks or sooner if any problems arise. In the meantime, encouraged to call the office with any questions, concerns, change in symptoms.   Celesta Gentile, DPM

## 2016-09-01 NOTE — Patient Instructions (Signed)

## 2016-09-02 DIAGNOSIS — M722 Plantar fascial fibromatosis: Secondary | ICD-10-CM | POA: Insufficient documentation

## 2016-09-14 ENCOUNTER — Ambulatory Visit (INDEPENDENT_AMBULATORY_CARE_PROVIDER_SITE_OTHER): Admitting: Family Medicine

## 2016-09-14 ENCOUNTER — Encounter: Payer: Self-pay | Admitting: Family Medicine

## 2016-09-14 VITALS — BP 139/60 | HR 65 | Temp 97.6°F | Wt 225.4 lb

## 2016-09-14 DIAGNOSIS — E782 Mixed hyperlipidemia: Secondary | ICD-10-CM | POA: Diagnosis not present

## 2016-09-14 DIAGNOSIS — R739 Hyperglycemia, unspecified: Secondary | ICD-10-CM | POA: Diagnosis not present

## 2016-09-14 DIAGNOSIS — Z6837 Body mass index (BMI) 37.0-37.9, adult: Secondary | ICD-10-CM

## 2016-09-14 DIAGNOSIS — I10 Essential (primary) hypertension: Secondary | ICD-10-CM

## 2016-09-14 DIAGNOSIS — E039 Hypothyroidism, unspecified: Secondary | ICD-10-CM

## 2016-09-14 DIAGNOSIS — M722 Plantar fascial fibromatosis: Secondary | ICD-10-CM | POA: Diagnosis not present

## 2016-09-14 DIAGNOSIS — E6609 Other obesity due to excess calories: Secondary | ICD-10-CM

## 2016-09-14 NOTE — Assessment & Plan Note (Addendum)
No fully controlled,did have an argument with someone prior to coming in, no changes to meds. Encouraged heart healthy diet such as the DASH diet and exercise as tolerated.

## 2016-09-14 NOTE — Progress Notes (Signed)
Patient ID: Destiny [name], female   DOB: 1950/07/30, 66 y.o.   MRN: [id number]   Subjective:  I acted as a Education administrator for Penni Homans, [place], [place]   Patient ID: Destiny [name], female    DOB: December 27, 1950, 66 y.o.   MRN: [id number]  Chief Complaint  Patient presents with  . Follow-up    HPI  Patient is in today for a 70-month F/U. Patient has a Hx of HTN, plantar fascitis, hyperlipidemia, obesity. Patient has no additional concerns noted at this time. She has some intermittent heal pain at times but does see podiatry now. No recent febrile illness or hospitalizations.  Patient Care Team: Mosie Lukes, MD as PCP - General (Family Medicine) Philemon Kingdom, MD as Consulting Physician (Internal Medicine)   Past Medical History:  Diagnosis Date  . Abnormal blood chemistry   . Arthropathy   . Breast cyst   . Cellulitis   . Chest discomfort   . Disturbance of skin sensation   . Edema   . Encounter for screening and preventative care 06/17/2016  . GERD (gastroesophageal reflux disease)   . Hiatal hernia with gastroesophageal reflux 06/17/2016  . High risk medication use   . Hoarseness 06/15/2016  . Hyperglycemia   . Hyperlipidemia   . Hyperlipidemia, mixed 06/15/2016  . Hypertension   . Hypopotassemia   . Hypothyroidism   . Menopause   . Obesity   . Positive ANA (antinuclear antibody)   . Shoulder pain, right   . SOB (shortness of breath)   . Thyroid disease 12/11/2014  . Thyroid nodule   . Vitamin D deficiency     Past Surgical History:  Procedure Laterality Date  . BREAST CYST ASPIRATION Left    30 years ago   . LIPOSUCTION    . WISDOM TOOTH EXTRACTION     and all upper teeth    Family History  Problem Relation Age of Onset  . Irregular heart beat Mother   . Heart disease Mother   . Rheumatic fever Mother   . Hypertension Father   . Ulcers Father   . Vision loss Brother   . Stroke Maternal Grandmother   . Diabetes Maternal Grandmother   . Hypertension Paternal  Grandmother   . Ulcers Paternal Grandfather     Social History   Social History  . Marital status: Married    Spouse name: N/A  . Number of children: N/A  . Years of education: N/A   Occupational History  . Not on file.   Social History Main Topics  . Smoking status: Former Research scientist (life sciences)  . Smokeless tobacco: Never Used  . Alcohol use 0.0 oz/week  . Drug use: No  . Sexual activity: Not on file   Other Topics Concern  . Not on file   Social History Narrative   Occasional alcohol use: hard liquor, wine and beer    Outpatient Medications Prior to Visit  Medication Sig Dispense Refill  . ARMOUR THYROID 90 MG tablet Take 1 tablet daily. 90 tablet 3  . Cholecalciferol (VITAMIN D PO) Take by mouth.    . COD LIVER OIL PO Take by mouth. Reported on 07/16/2015    . Cyanocobalamin (VITAMIN B-12) 5000 MCG TBDP Take 5,000 mcg by mouth daily.    . irbesartan (AVAPRO) 150 MG tablet Take 75 mg by mouth daily.    . metoprolol succinate (TOPROL-XL) 25 MG 24 hr tablet Take 1 tablet (25 mg total) by mouth daily. 90 tablet 3  .  RED YEAST RICE EXTRACT PO Take by mouth. Reported on 07/16/2015     No facility-administered medications prior to visit.     Allergies  Allergen Reactions  . Diclofenac Itching, Nausea Only and Other (See Comments)    Wheezing  . Hydrochlorothiazide     Wheezing. Losing teeth.  . Statins     Review of Systems  Constitutional: Negative for fever and malaise/fatigue.  HENT: Negative for congestion.   Eyes: Negative for blurred vision.  Respiratory: Negative for cough and shortness of breath.   Cardiovascular: Negative for chest pain, palpitations and leg swelling.  Gastrointestinal: Negative for vomiting.  Musculoskeletal: Positive for joint pain. Negative for back pain.  Skin: Negative for rash.  Neurological: Negative for loss of consciousness and headaches.       Objective:    Physical Exam  Constitutional: She is oriented to person, place, and time. She  appears well-developed and well-nourished. No distress.  HENT:  Head: Normocephalic and atraumatic.  Eyes: Conjunctivae are normal.  Neck: Normal range of motion. No thyromegaly present.  Cardiovascular: Normal rate and regular rhythm.   Pulmonary/Chest: Effort normal and breath sounds normal. She has no wheezes.  Abdominal: Soft. Bowel sounds are normal. There is no tenderness.  Musculoskeletal: She exhibits no edema or deformity.  Neurological: She is alert and oriented to person, place, and time.  Skin: Skin is warm and dry. She is not diaphoretic.  Psychiatric: She has a normal mood and affect.    BP 139/60 (BP Location: Left Arm, Patient Position: Sitting, Cuff Size: Normal)   Pulse 65   Temp 97.6 F (36.4 C) (Oral)   Wt 225 lb 6.4 oz (102.2 kg)   SpO2 98% Comment: RA  BMI 39.93 kg/m  Wt Readings from Last 3 Encounters:  09/14/16 225 lb 6.4 oz (102.2 kg)  08/24/16 229 lb 4 oz (104 kg)  08/12/16 226 lb 3.2 oz (102.6 kg)   BP Readings from Last 3 Encounters:  09/14/16 139/60  09/01/16 (!) 147/78  08/24/16 132/82      There is no immunization history on file for this patient.  Health Maintenance  Topic Date Due  . HIV Screening  09/22/1967  . TETANUS/TDAP  09/22/1971  . PAP SMEAR  06/08/2016  . INFLUENZA VACCINE  01/06/2017  . MAMMOGRAM  07/23/2018  . COLONOSCOPY  06/08/2022  . Hepatitis C Screening  Completed    Lab Results  Component Value Date   WBC 5.4 06/15/2016   HGB 14.5 06/15/2016   HCT 41.8 06/15/2016   PLT 292.0 06/15/2016   GLUCOSE 184 (H) 08/24/2016   CHOL 273 (H) 06/15/2016   TRIG 285.0 (H) 06/15/2016   HDL 43.20 06/15/2016   LDLDIRECT 177.0 06/15/2016   ALT 38 (H) 08/24/2016   AST 26 08/24/2016   NA 137 08/24/2016   K 3.8 08/24/2016   CL 101 08/24/2016   CREATININE 0.83 08/24/2016   BUN 10 08/24/2016   CO2 26 08/24/2016   TSH 2.86 07/16/2016    Lab Results  Component Value Date   TSH 2.86 07/16/2016   Lab Results  Component  Value Date   WBC 5.4 06/15/2016   HGB 14.5 06/15/2016   HCT 41.8 06/15/2016   MCV 88.5 06/15/2016   PLT 292.0 06/15/2016   Lab Results  Component Value Date   NA 137 08/24/2016   K 3.8 08/24/2016   CO2 26 08/24/2016   GLUCOSE 184 (H) 08/24/2016   BUN 10 08/24/2016   CREATININE 0.83 08/24/2016  BILITOT 0.4 08/24/2016   ALKPHOS 61 08/24/2016   AST 26 08/24/2016   ALT 38 (H) 08/24/2016   PROT 6.8 08/24/2016   ALBUMIN 4.2 08/24/2016   CALCIUM 9.4 08/24/2016   GFR 73.58 08/24/2016   Lab Results  Component Value Date   CHOL 273 (H) 06/15/2016   Lab Results  Component Value Date   HDL 43.20 06/15/2016   No results found for: [facility] Lab Results  Component Value Date   TRIG 285.0 (H) 06/15/2016   Lab Results  Component Value Date   CHOLHDL 6 06/15/2016   No results found for: HGBA1C       Assessment & Plan:   Problem List Items Addressed This Visit    Hypothyroidism (Chronic)    On Levothyroxine, continue to monitor      Relevant Orders   TSH   T4, free   Essential hypertension    No fully controlled,did have an argument with someone prior to coming in, no changes to meds. Encouraged heart healthy diet such as the DASH diet and exercise as tolerated.       Relevant Orders   CBC   Comprehensive metabolic panel   TSH   Class 2 obesity due to excess calories without serious comorbidity with body mass index (BMI) of 37.0 to 37.9 in adult    Encouraged DASH diet, decrease po intake and increase exercise as tolerated. Needs 7-8 hours of sleep nightly. Avoid trans fats, eat small, frequent meals every 4-5 hours with lean proteins, complex carbs and healthy fats. Minimize simple carbs, referred to bariatrics for consideration      Hyperlipidemia, mixed    Encouraged heart healthy diet, increase exercise, avoid trans fats, consider a krill oil cap daily      Relevant Orders   Lipid panel   Plantar fasciitis    Encouraged ice, stretching, orthotics and  topical gels, follow up with podiatry as needed.       Other Visit Diagnoses    Hyperglycemia    -  Primary   Relevant Orders   Hemoglobin A1c      I am having Ms. Hardenbrook maintain her Cholecalciferol (VITAMIN D PO), COD LIVER OIL PO, RED YEAST RICE EXTRACT PO, metoprolol succinate, ARMOUR THYROID, Vitamin B-12, and irbesartan.  No orders of the defined types were placed in this encounter.   CMA served as Education administrator during this visit. History, Physical and Plan performed by medical provider. Documentation and orders reviewed and attested to.  Penni Homans, MD

## 2016-09-14 NOTE — Assessment & Plan Note (Signed)
Encouraged ice, stretching, orthotics and topical gels, follow up with podiatry as needed.

## 2016-09-14 NOTE — Assessment & Plan Note (Signed)
Encouraged heart healthy diet, increase exercise, avoid trans fats, consider a krill oil cap daily 

## 2016-09-14 NOTE — Progress Notes (Signed)
Pre visit review using our clinic review tool, if applicable. No additional management support is needed unless otherwise documented below in the visit note. 

## 2016-09-14 NOTE — Assessment & Plan Note (Signed)
Encouraged DASH diet, decrease po intake and increase exercise as tolerated. Needs 7-8 hours of sleep nightly. Avoid trans fats, eat small, frequent meals every 4-5 hours with lean proteins, complex carbs and healthy fats. Minimize simple carbs, referred to bariatrics for consideration

## 2016-09-14 NOTE — Assessment & Plan Note (Signed)
On Levothyroxine, continue to monitor 

## 2016-09-14 NOTE — Patient Instructions (Signed)

## 2016-09-22 ENCOUNTER — Ambulatory Visit (INDEPENDENT_AMBULATORY_CARE_PROVIDER_SITE_OTHER): Admitting: Podiatry

## 2016-09-22 DIAGNOSIS — M7661 Achilles tendinitis, right leg: Secondary | ICD-10-CM

## 2016-09-22 DIAGNOSIS — M722 Plantar fascial fibromatosis: Secondary | ICD-10-CM

## 2016-09-25 NOTE — Progress Notes (Signed)
Subjective: 66 year old female presents the office today for follow up evaluation of right heel pain, plantar fasciitis. Overall she feels that she is improving but she still gets some pain above the heel is also getting some pain to the Achilles tendon. She has been stretching, icing. She completed steroids. Has not awakened at night she has no new concerns. Denies any systemic complaints such as fevers, chills, nausea, vomiting. No acute changes since last appointment, and no other complaints at this time.   Objective: AAO x3, NAD DP/PT pulses palpable bilaterally, CRT less than 3 seconds There is continued but improved tenderness to palpation along the plantar medial tubercle of the calcaneus at the insertion of plantar fascia on the right foot. There is no pain along the course of the plantar fascia within the arch of the foot. Plantar fascia appears to be intact. There is no pain with lateral compression of the calcaneus or pain with vibratory sensation. There is minimal pain along the insertion of the achilles tendon. Equinus is present and she feels like it is pulling. Thompson test negative. No other areas of tenderness to bilateral lower extremities. No open lesions or pre-ulcerative lesions.  No pain with calf compression, swelling, warmth, erythema  Assessment: 66 year old female right plantar fasciitis with Achilles tendinitis  Plan: -All treatment options discussed with the patient including all alternatives, risks, complications.  -Patient elects to proceed with steroid injection into the right heel. Under sterile skin preparation, a total of 2.5cc of kenalog 10, 0.5% Marcaine plain, and 2% lidocaine plain were infiltrated into the symptomatic area without complication. A band-aid was applied. Patient tolerated the injection well without complication. Post-injection care with discussed with the patient. Discussed with the patient to ice the area over the next couple of days to help  prevent a steroid flare.  -Heel lift dispensed. -Continue stretching and icing exercises daily. Discussed shoe gear modifications and orthotics. -Follow-up 3 weeks or sooner if needed. Call any questions or concerns meantime.  Celesta Gentile, DPM

## 2016-10-20 ENCOUNTER — Ambulatory Visit (INDEPENDENT_AMBULATORY_CARE_PROVIDER_SITE_OTHER): Admitting: Podiatry

## 2016-10-20 ENCOUNTER — Encounter: Payer: Self-pay | Admitting: Podiatry

## 2016-10-20 DIAGNOSIS — M7661 Achilles tendinitis, right leg: Secondary | ICD-10-CM | POA: Diagnosis not present

## 2016-10-20 DIAGNOSIS — M722 Plantar fascial fibromatosis: Secondary | ICD-10-CM | POA: Diagnosis not present

## 2016-10-20 NOTE — Progress Notes (Signed)
Subjective: 66 year old female presents the office today for follow up evaluation of right heel pain, plantar fasciitis. She states that she is doing better. She went and purchased new Brooks shoes with inserts and she states they feel "great" and her pain is almost resolved. She is continued stretching daily but she has not been increasing. Denies any systemic complaints such as fevers, chills, nausea, vomiting. No acute changes since last appointment, and no other complaints at this time.   Objective: AAO x3, NAD DP/PT pulses palpable bilaterally, CRT less than 3 seconds There is minimal tenderness to palpation along the plantar medial tubercle of the calcaneus at the insertion of plantar fascia on the right foot. There is no pain along the course of the plantar fascia within the arch of the foot. Plantar fascia appears to be intact. There is no pain with lateral compression of the calcaneus or pain with vibratory sensation. There is minimal pain along the insertion of the achilles tendon. Equinus is present for this is also mildly improved. Thompson test negative. No other areas of tenderness to bilateral lower extremities. No open lesions or pre-ulcerative lesions.  No pain with calf compression, swelling, warmth, erythema  Assessment: 66 year old female right plantar fasciitis with Achilles tendinitis  Plan: -All treatment options discussed with the patient including all alternatives, risks, complications.  -She started he had 2 steroid injections in her pain is very minimal she wishes to hold off on the third injection today. -She's been wearing new shoes for the last 2 weeks and has been helping. Continue to wear issues as well as inserts. -Stretching, icing daily. -Symptoms not resolve the next 4-6 weeks recommended a follow-up or sooner if needed. I encouraged to call a question or concerns or any change in symptoms.  Celesta Gentile, DPM

## 2016-11-19 ENCOUNTER — Ambulatory Visit: Admitting: Family Medicine

## 2016-11-24 ENCOUNTER — Ambulatory Visit: Admitting: Podiatry

## 2016-12-17 ENCOUNTER — Encounter (INDEPENDENT_AMBULATORY_CARE_PROVIDER_SITE_OTHER): Admitting: Family Medicine

## 2016-12-21 ENCOUNTER — Encounter: Payer: Self-pay | Admitting: Family Medicine

## 2016-12-21 ENCOUNTER — Ambulatory Visit (INDEPENDENT_AMBULATORY_CARE_PROVIDER_SITE_OTHER): Admitting: Family Medicine

## 2016-12-21 VITALS — BP 138/80 | HR 74 | Temp 97.9°F | Resp 18 | Wt 228.2 lb

## 2016-12-21 DIAGNOSIS — R739 Hyperglycemia, unspecified: Secondary | ICD-10-CM

## 2016-12-21 DIAGNOSIS — M722 Plantar fascial fibromatosis: Secondary | ICD-10-CM

## 2016-12-21 DIAGNOSIS — E782 Mixed hyperlipidemia: Secondary | ICD-10-CM

## 2016-12-21 DIAGNOSIS — R1013 Epigastric pain: Secondary | ICD-10-CM

## 2016-12-21 DIAGNOSIS — I10 Essential (primary) hypertension: Secondary | ICD-10-CM | POA: Diagnosis not present

## 2016-12-21 DIAGNOSIS — K219 Gastro-esophageal reflux disease without esophagitis: Secondary | ICD-10-CM

## 2016-12-21 DIAGNOSIS — E039 Hypothyroidism, unspecified: Secondary | ICD-10-CM

## 2016-12-21 DIAGNOSIS — K449 Diaphragmatic hernia without obstruction or gangrene: Secondary | ICD-10-CM

## 2016-12-21 HISTORY — DX: Hyperglycemia, unspecified: R73.9

## 2016-12-21 NOTE — Assessment & Plan Note (Addendum)
Worsening epigastric pain and dyspepsia. Check H Pylori and avoid offending foods, medication is helping. Moves bowels daily does note they float and are loose at times. Try adding a fiber supplement such as Benefiber twice daily and a good probiotic

## 2016-12-21 NOTE — Assessment & Plan Note (Signed)
Check tsh today 

## 2016-12-21 NOTE — Assessment & Plan Note (Addendum)
Check a hgba1c minimize simple carbs. Increase exercise as tolerated. Reports increased fatigue, episodes anxious and shakey, cold sweats when her sugar drops. She feels better after she eats something. One carb per meal, complex

## 2016-12-21 NOTE — Patient Instructions (Addendum)
Try adding a fiber supplement such as Benefiber twice daily and a good probiotic, NOW  Increase 64 oz of clear fluids daily, protein every 4-6 hours. Helicobacter Pylori Infection Helicobacter pylori infection is an infection in the stomach that is caused by the Helicobacter pylori (H. pylori) bacteria. This type of bacteria often lives in the lining of the stomach. The infection can cause ulcers and irritation (gastritis) in some people. It is the most common cause of ulcers in the stomach (gastric ulcer) and in the upper part of the intestine (duodenal ulcer). Having this infection may also increase the risk of stomach cancer and a type of white blood cell cancer (lymphoma) that affects the stomach. What are the causes? H. pylori is a type of bacteria that is often found in the stomachs of healthy people. The bacteria may be passed from person to person through contact with stool or saliva. It is not known why some people develop ulcers, gastritis, or cancer from the infection. What increases the risk? This condition is more likely to develop in people who:  Have family members with the infection.  Live with many other people, such as in a dormitory.  Are of African, Hispanic, or Asian descent.  What are the signs or symptoms? Most people with this infection do not have symptoms. If you do have symptoms, they may include:  Heartburn.  Stomach pain.  Nausea.  Vomiting.  Blood-tinged vomit.  Loss of appetite.  Bad breath.  How is this diagnosed? This condition may be diagnosed based on your symptoms, a physical exam, and various tests. Tests may include:  Blood tests or stool tests to check for the proteins (antibodies) that your body may produce in response to the bacteria. These tests are the [place] way to confirm the diagnosis.  A breath test to check for the type of gas that the H. pylori bacteria release after breaking down a substance called urea. For the test, you are asked  to drink urea. This test is often done after treatment in order to find out if the treatment worked.  A procedure in which a thin, flexible tube with a tiny camera at the end is placed into your stomach and upper intestine (upper endoscopy). Your health care provider may also take tissue samples (biopsy) to test for H. pylori and cancer.  How is this treated? Treatment for this condition usually involves taking a combination of medicines (triple therapy) for a couple of weeks. Triple therapy includes one medicine to reduce the acid in your stomach and two types of antibiotic medicines. Many drug combinations have been approved for treatment. Treatment usually kills the H. pylori and reduces your risk of cancer. You may need to be tested for H. pylori again after treatment. In some cases, the treatment may need to be repeated. Follow these instructions at home:  Take over-the-counter and prescription medicines only as told by your health care provider.  Take your antibiotics as told by your health care provider. Do not stop taking the antibiotics even if you start to feel better.  You can do all your usual activities and eat what you usually do.  Take steps to prevent future infections: ? Wash your hands often. ? Make sure the food you eat has been properly prepared. ? Drink water only from clean sources.  Keep all follow-up visits as told by your health care provider. This is important. Contact a health care provider if:  Your symptoms do not get better.  Your symptoms return after treatment. This information is not intended to replace advice given to you by your health care provider. Make sure you discuss any questions you have with your health care provider. Document Released: 09/16/2015 Document Revised: 10/31/2015 Document Reviewed: 06/06/2014 Elsevier Interactive Patient Education  2018 [employer].

## 2016-12-21 NOTE — Assessment & Plan Note (Signed)
Not well controlled, no changes to meds. Encouraged heart healthy diet such as the DASH diet and exercise as tolerated.  

## 2016-12-21 NOTE — Progress Notes (Signed)
Subjective:  I acted as a Education administrator for Dr. [name]. [place], [place]  Patient ID: Destiny [name], female    DOB: Jul 19, 1950, 66 y.o.   MRN: [id number]  No chief complaint on file.   HPI  Patient is in today for follow up. She is starting with bariatric weight loss program soon and has already attended the initial lecture. She is noting most of her complaints relate back to weight gain. Notes b/l heel pain. Epigastric and lower abdominal pain. She notes fatigue and a sense of lightheadedness upon arising lately. SOB with exertion at times. Denies CP/pal/HA/congestion/fevers/GI or GU c/o. Taking meds as prescribed  Patient Care Team: Mosie Lukes, MD as PCP - General (Family Medicine) Philemon Kingdom, MD as Consulting Physician (Internal Medicine)   Past Medical History:  Diagnosis Date  . Abnormal blood chemistry   . Arthropathy   . Breast cyst   . Cellulitis   . Chest discomfort   . Disturbance of skin sensation   . Edema   . Encounter for screening and preventative care 06/17/2016  . GERD (gastroesophageal reflux disease)   . Hiatal hernia with gastroesophageal reflux 06/17/2016  . High risk medication use   . Hoarseness 06/15/2016  . Hyperglycemia   . Hyperglycemia 12/21/2016  . Hyperlipidemia   . Hyperlipidemia, mixed 06/15/2016  . Hypertension   . Hypopotassemia   . Hypothyroidism   . Menopause   . Obesity   . Positive ANA (antinuclear antibody)   . Shoulder pain, right   . SOB (shortness of breath)   . Thyroid disease 12/11/2014  . Thyroid nodule   . Vitamin D deficiency     Past Surgical History:  Procedure Laterality Date  . BREAST CYST ASPIRATION Left    30 years ago   . LIPOSUCTION    . WISDOM TOOTH EXTRACTION     and all upper teeth    Family History  Problem Relation Age of Onset  . Irregular heart beat Mother   . Heart disease Mother   . Rheumatic fever Mother   . Hypertension Father   . Ulcers Father   . Vision loss Brother   . Stroke Maternal  Grandmother   . Diabetes Maternal Grandmother   . Hypertension Paternal Grandmother   . Ulcers Paternal Grandfather     Social History   Social History  . Marital status: Married    Spouse name: N/A  . Number of children: N/A  . Years of education: N/A   Occupational History  . Not on file.   Social History Main Topics  . Smoking status: Former Research scientist (life sciences)  . Smokeless tobacco: Never Used  . Alcohol use 0.0 oz/week  . Drug use: No  . Sexual activity: Not on file   Other Topics Concern  . Not on file   Social History Narrative   Occasional alcohol use: hard liquor, wine and beer    Outpatient Medications Prior to Visit  Medication Sig Dispense Refill  . ARMOUR THYROID 90 MG tablet Take 1 tablet daily. 90 tablet 3  . Cholecalciferol (VITAMIN D PO) Take by mouth.    . COD LIVER OIL PO Take by mouth. Reported on 07/16/2015    . Cyanocobalamin (VITAMIN B-12) 5000 MCG TBDP Take 5,000 mcg by mouth daily.    . irbesartan (AVAPRO) 150 MG tablet Take 75 mg by mouth daily.    . metoprolol succinate (TOPROL-XL) 25 MG 24 hr tablet Take 1 tablet (25 mg total) by mouth daily. [place]  tablet 3  . RED YEAST RICE EXTRACT PO Take by mouth. Reported on 07/16/2015     No facility-administered medications prior to visit.     Allergies  Allergen Reactions  . Diclofenac Itching, Nausea Only and Other (See Comments)    Wheezing  . Hydrochlorothiazide     Wheezing. Losing teeth.  . Statins     Review of Systems  Constitutional: Positive for malaise/fatigue. Negative for fever.  HENT: Negative for congestion.   Eyes: Negative for blurred vision.  Respiratory: Negative for shortness of breath.   Cardiovascular: Negative for chest pain, palpitations and leg swelling.  Gastrointestinal: Positive for abdominal pain. Negative for blood in stool and nausea.  Genitourinary: Negative for dysuria and frequency.  Musculoskeletal: Positive for joint pain. Negative for falls.  Skin: Negative for rash.    Neurological: Positive for dizziness. Negative for loss of consciousness and headaches.  Endo/Heme/Allergies: Negative for environmental allergies.  Psychiatric/Behavioral: Negative for depression. The patient is not nervous/anxious.        Objective:    Physical Exam  Constitutional: She is oriented to person, place, and time. She appears well-developed and well-nourished. No distress.  HENT:  Head: Normocephalic and atraumatic.  Nose: Nose normal.  Eyes: Right eye exhibits no discharge. Left eye exhibits no discharge.  Neck: Normal range of motion. Neck supple.  Cardiovascular: Normal rate and regular rhythm.   No murmur heard. Pulmonary/Chest: Effort normal and breath sounds normal.  Abdominal: Soft. Bowel sounds are normal. There is no tenderness.  Musculoskeletal: She exhibits no edema.  Neurological: She is alert and oriented to person, place, and time.  Skin: Skin is warm and dry.  Psychiatric: She has a normal mood and affect.  Nursing note and vitals reviewed.   BP 138/80   Pulse 74   Temp 97.9 F (36.6 C)   Resp 18   Wt 228 lb 3.2 oz (103.5 kg)   SpO2 97%   BMI 40.42 kg/m  Wt Readings from Last 3 Encounters:  12/21/16 228 lb 3.2 oz (103.5 kg)  09/14/16 225 lb 6.4 oz (102.2 kg)  08/24/16 229 lb 4 oz (104 kg)   BP Readings from Last 3 Encounters:  12/21/16 138/80  09/14/16 139/60  09/01/16 (!) 147/78      There is no immunization history on file for this patient.  Health Maintenance  Topic Date Due  . HIV Screening  09/22/1967  . TETANUS/TDAP  09/22/1971  . PAP SMEAR  06/08/2016  . INFLUENZA VACCINE  01/06/2017  . MAMMOGRAM  07/23/2018  . COLONOSCOPY  06/08/2022  . Hepatitis C Screening  Completed    Lab Results  Component Value Date   WBC 5.4 06/15/2016   HGB 14.5 06/15/2016   HCT 41.8 06/15/2016   PLT 292.0 06/15/2016   GLUCOSE 184 (H) 08/24/2016   CHOL 273 (H) 06/15/2016   TRIG 285.0 (H) 06/15/2016   HDL 43.20 06/15/2016   LDLDIRECT  177.0 06/15/2016   ALT 38 (H) 08/24/2016   AST 26 08/24/2016   NA 137 08/24/2016   K 3.8 08/24/2016   CL 101 08/24/2016   CREATININE 0.83 08/24/2016   BUN 10 08/24/2016   CO2 26 08/24/2016   TSH 2.86 07/16/2016    Lab Results  Component Value Date   TSH 2.86 07/16/2016   Lab Results  Component Value Date   WBC 5.4 06/15/2016   HGB 14.5 06/15/2016   HCT 41.8 06/15/2016   MCV 88.5 06/15/2016   PLT 292.0 06/15/2016  Lab Results  Component Value Date   NA 137 08/24/2016   K 3.8 08/24/2016   CO2 26 08/24/2016   GLUCOSE 184 (H) 08/24/2016   BUN 10 08/24/2016   CREATININE 0.83 08/24/2016   BILITOT 0.4 08/24/2016   ALKPHOS 61 08/24/2016   AST 26 08/24/2016   ALT 38 (H) 08/24/2016   PROT 6.8 08/24/2016   ALBUMIN 4.2 08/24/2016   CALCIUM 9.4 08/24/2016   GFR 73.58 08/24/2016   Lab Results  Component Value Date   CHOL 273 (H) 06/15/2016   Lab Results  Component Value Date   HDL 43.20 06/15/2016   No results found for: [facility] Lab Results  Component Value Date   TRIG 285.0 (H) 06/15/2016   Lab Results  Component Value Date   CHOLHDL 6 06/15/2016   No results found for: HGBA1C       Assessment & Plan:   Problem List Items Addressed This Visit    Hypothyroidism (Chronic)    Check tsh today      Relevant Orders   TSH   T4, free   Essential hypertension    Not well controlled, no changes to meds. Encouraged heart healthy diet such as the DASH diet and exercise as tolerated.       Relevant Orders   Comprehensive metabolic panel   Hyperlipidemia, mixed    Encouraged heart healthy diet, increase exercise, avoid trans fats, consider a krill oil cap daily      Relevant Orders   Lipid panel   Hiatal hernia with gastroesophageal reflux    Worsening epigastric pain and dyspepsia. Check H Pylori and avoid offending foods, medication is helping. Moves bowels daily does note they float and are loose at times. Try adding a fiber supplement such as Benefiber  twice daily and a good probiotic      Relevant Orders   H. pylori antibody, IgG   Plantar fasciitis    Is following with podiatry, Dr Jacqualyn Posey and notes an improvement with new foot wear.       Hyperglycemia    Check a hgba1c minimize simple carbs. Increase exercise as tolerated. Reports increased fatigue, episodes anxious and shakey, cold sweats when her sugar drops. She feels better after she eats something. One carb per meal, complex      Relevant Orders   Hemoglobin A1c    Other Visit Diagnoses    Epigastric pain    -  Primary   Relevant Orders   CBC with Differential/Platelet   Sedimentation rate   H. pylori antibody, IgG      I am having Ms. Glorioso maintain her Cholecalciferol (VITAMIN D PO), COD LIVER OIL PO, RED YEAST RICE EXTRACT PO, metoprolol succinate, ARMOUR THYROID, Vitamin B-12, and irbesartan.  No orders of the defined types were placed in this encounter.   CMA served as Education administrator during this visit. History, Physical and Plan performed by medical provider. Documentation and orders reviewed and attested to.  Penni Homans, MD

## 2016-12-21 NOTE — Assessment & Plan Note (Signed)
Is following with podiatry, Dr Jacqualyn Posey and notes an improvement with new foot wear.

## 2016-12-21 NOTE — Assessment & Plan Note (Signed)
Encouraged heart healthy diet, increase exercise, avoid trans fats, consider a krill oil cap daily 

## 2016-12-22 LAB — CBC WITH DIFFERENTIAL/PLATELET
BASOS ABS: 0.1 10*3/uL (ref 0.0–0.1)
Basophils Relative: 1.8 % (ref 0.0–3.0)
EOS ABS: 0.3 10*3/uL (ref 0.0–0.7)
Eosinophils Relative: 5.6 % — ABNORMAL HIGH (ref 0.0–5.0)
HEMATOCRIT: 41.2 % (ref 36.0–46.0)
HEMOGLOBIN: 14 g/dL (ref 12.0–15.0)
LYMPHS PCT: 26.6 % (ref 12.0–46.0)
Lymphs Abs: 1.4 10*3/uL (ref 0.7–4.0)
MCHC: 33.9 g/dL (ref 30.0–36.0)
MCV: 90.4 fl (ref 78.0–100.0)
MONOS PCT: 10 % (ref 3.0–12.0)
Monocytes Absolute: 0.5 10*3/uL (ref 0.1–1.0)
NEUTROS ABS: 3 10*3/uL (ref 1.4–7.7)
Neutrophils Relative %: 56 % (ref 43.0–77.0)
PLATELETS: 307 10*3/uL (ref 150.0–400.0)
RBC: 4.56 Mil/uL (ref 3.87–5.11)
RDW: 13.1 % (ref 11.5–15.5)
WBC: 5.4 10*3/uL (ref 4.0–10.5)

## 2016-12-22 LAB — COMPREHENSIVE METABOLIC PANEL
ALBUMIN: 4.3 g/dL (ref 3.5–5.2)
ALT: 26 U/L (ref 0–35)
AST: 22 U/L (ref 0–37)
Alkaline Phosphatase: 66 U/L (ref 39–117)
BUN: 14 mg/dL (ref 6–23)
CALCIUM: 9.9 mg/dL (ref 8.4–10.5)
CHLORIDE: 102 meq/L (ref 96–112)
CO2: 28 mEq/L (ref 19–32)
Creatinine, Ser: 0.85 mg/dL (ref 0.40–1.20)
GFR: 71.51 mL/min (ref >60.00)
GLUCOSE: 90 mg/dL (ref 70–99)
POTASSIUM: 4.2 meq/L (ref 3.5–5.1)
Sodium: 139 mEq/L (ref 135–145)
TOTAL PROTEIN: 7 g/dL (ref 6.0–8.3)
Total Bilirubin: 0.3 mg/dL (ref 0.2–1.2)

## 2016-12-22 LAB — LIPID PANEL
CHOL/HDL RATIO: 5
CHOLESTEROL: 222 mg/dL — AB (ref 0–200)
HDL: 42.5 mg/dL (ref >39.00)
NonHDL: 179.47
Triglycerides: 240 mg/dL — ABNORMAL HIGH (ref 0.0–149.0)
VLDL: 48 mg/dL — AB (ref 0.0–40.0)

## 2016-12-22 LAB — HEMOGLOBIN A1C: HEMOGLOBIN A1C: 5.7 % (ref 4.6–6.5)

## 2016-12-22 LAB — T4, FREE: Free T4: 0.57 ng/dL — ABNORMAL LOW (ref 0.60–1.60)

## 2016-12-22 LAB — H. PYLORI ANTIBODY, IGG: H Pylori IgG: NEGATIVE

## 2016-12-22 LAB — SEDIMENTATION RATE: Sed Rate: 17 mm/hr (ref 0–30)

## 2016-12-22 LAB — TSH: TSH: 4.25 u[IU]/mL (ref 0.35–4.50)

## 2016-12-22 LAB — LDL CHOLESTEROL, DIRECT: Direct LDL: 146 mg/dL

## 2016-12-24 ENCOUNTER — Encounter (INDEPENDENT_AMBULATORY_CARE_PROVIDER_SITE_OTHER): Payer: Self-pay | Admitting: Family Medicine

## 2016-12-24 ENCOUNTER — Ambulatory Visit (INDEPENDENT_AMBULATORY_CARE_PROVIDER_SITE_OTHER): Admitting: Family Medicine

## 2016-12-24 ENCOUNTER — Telehealth: Payer: Self-pay | Admitting: *Deleted

## 2016-12-24 VITALS — BP 135/75 | HR 64 | Temp 98.0°F | Ht 63.0 in | Wt 226.0 lb

## 2016-12-24 DIAGNOSIS — Z0289 Encounter for other administrative examinations: Secondary | ICD-10-CM

## 2016-12-24 DIAGNOSIS — R0609 Other forms of dyspnea: Secondary | ICD-10-CM

## 2016-12-24 DIAGNOSIS — E038 Other specified hypothyroidism: Secondary | ICD-10-CM

## 2016-12-24 DIAGNOSIS — Z6841 Body Mass Index (BMI) 40.0 and over, adult: Secondary | ICD-10-CM

## 2016-12-24 DIAGNOSIS — E669 Obesity, unspecified: Secondary | ICD-10-CM

## 2016-12-24 DIAGNOSIS — Z1389 Encounter for screening for other disorder: Secondary | ICD-10-CM | POA: Diagnosis not present

## 2016-12-24 DIAGNOSIS — R739 Hyperglycemia, unspecified: Secondary | ICD-10-CM | POA: Diagnosis not present

## 2016-12-24 DIAGNOSIS — R5383 Other fatigue: Secondary | ICD-10-CM

## 2016-12-24 DIAGNOSIS — E559 Vitamin D deficiency, unspecified: Secondary | ICD-10-CM | POA: Diagnosis not present

## 2016-12-24 DIAGNOSIS — Z1331 Encounter for screening for depression: Secondary | ICD-10-CM

## 2016-12-24 DIAGNOSIS — IMO0001 Reserved for inherently not codable concepts without codable children: Secondary | ICD-10-CM

## 2016-12-24 NOTE — Progress Notes (Signed)
Office: [phone]  /  Fax: [phone]   Dear Dr. [name],   Thank you for referring Destiny [name] to our clinic. The following note includes my evaluation and treatment recommendations.  HPI:   Chief Complaint: OBESITY  Destiny [name] has been referred by Erline Levine A. [name], MD for consultation regarding her obesity and obesity related comorbidities.  Destiny [name] (MR# [id number]) is a 66 y.o. female who presents on 12/24/2016 for obesity evaluation and treatment. Current BMI is Body mass index is 40.03 kg/m.Marland Kitchen Zekiah has struggled with obesity for years and has been unsuccessful in either losing weight or maintaining long term weight loss. Destiny [name] attended our information session and states she is currently in the action stage of change and ready to dedicate time achieving and maintaining a healthier weight.  Destiny [name] states her family eats meals together she struggles with family and or coworkers weight loss sabotage her desired weight loss is 71 she has been heavy most of  her life she started gaining weight after marriage her heaviest weight ever was 235 to 240 lbs. she is a picky eater and doesn't like to eat healthier foods  she has significant food cravings issues  she snacks frequently in the evenings she skips meals frequently she is frequently drinking liquids with calories she frequently makes poor food choices she has problems with excessive hunger  she frequently eats larger portions than normal  she has binge eating behaviors she struggles with emotional eating    Destiny [name] feels her energy is lower than it should be. This has worsened with weight gain and has not worsened recently. Destiny [name] admits to daytime somnolence and  admits to waking up still tired. Patient is at risk for obstructive sleep apnea. Destiny [name] has a history of symptoms of daytime Destiny and morning Destiny. Patient generally gets 6 or 7 hours of sleep per night, and states they generally have  restless sleep. Snoring is present. Apneic episodes are present. Epworth Sleepiness Score is 15  Destiny [name] notes increasing shortness of breath with exercising and seems to be worsening over time with weight gain. She notes getting out of breath sooner with activity than she used to. This has not gotten worse recently. Destiny [name] denies orthopnea.  Destiny [name] has a diagnosis of Destiny D deficiency. She is currently taking OTC vit D and denies nausea, vomiting or muscle weakness.  Destiny [name] has a diagnosis of hypothyroidism. She is currently on armour thyroid by Dr. Cruzita Lederer, she thinks synthroid gave her hallucinations. She denies hot or cold intolerance or palpitations, but does admit to ongoing Destiny.  Destiny [name] has a history of some elevated blood glucose readings without a diagnosis of diabetes. She denies polyphagia.  Depression Screen Destiny [name]'s Food and Mood (modified PHQ-9) score was  Depression screen PHQ 2/9 12/24/2016  Decreased Interest 3  Down, Depressed, Hopeless 3  PHQ - 2 Score 6  Altered sleeping 3  Tired, decreased energy 3  Change in appetite 3  Feeling bad or failure about yourself  3  Trouble concentrating 0  Moving slowly or fidgety/restless 3  Suicidal thoughts 0  PHQ-9 Score 21    ALLERGIES: Allergies  Allergen Reactions  . Diclofenac Itching, Nausea Only and Other (See Comments)    Wheezing  . Hydrochlorothiazide     Wheezing. Losing teeth.  . Statins     MEDICATIONS: Current Outpatient Prescriptions on File Prior to Visit  Medication Sig Dispense Refill  . ARMOUR THYROID  90 MG tablet Take 1 tablet daily. 90 tablet 3  . Cholecalciferol (Destiny D PO) Take by mouth.    . COD LIVER OIL PO Take by mouth. Reported on 07/16/2015    . Cyanocobalamin (Destiny B-12) 5000 MCG TBDP Take 5,000 mcg by mouth daily.    . irbesartan (AVAPRO) 150 MG tablet Take 75 mg by mouth daily.    . metoprolol  succinate (TOPROL-XL) 25 MG 24 hr tablet Take 1 tablet (25 mg total) by mouth daily. 90 tablet 3  . RED YEAST RICE EXTRACT PO Take by mouth. Reported on 07/16/2015     No current facility-administered medications on file prior to visit.     PAST MEDICAL HISTORY: Past Medical History:  Diagnosis Date  . Abnormal blood chemistry   . Arthropathy   . Back pain   . Breast cyst   . Cellulitis   . Chest discomfort   . Constipation   . Disturbance of skin sensation   . Edema   . Encounter for screening and preventative care 06/17/2016  . GERD (gastroesophageal reflux disease)   . Hiatal hernia with gastroesophageal reflux 06/17/2016  . High risk medication use   . Hoarseness 06/15/2016  . Destiny   . Destiny 12/21/2016  . Hyperlipidemia   . Hyperlipidemia, mixed 06/15/2016  . Hypertension   . Hypopotassemia   . Hypothyroidism   . Joint pain   . Menopause   . Obesity   . Palpitations   . Positive ANA (antinuclear antibody)   . Shoulder pain, right   . Sleep apnea   . SOB (shortness of breath)   . Thyroid disease 12/11/2014  . Thyroid nodule   . Destiny D deficiency   . Destiny D deficiency     PAST SURGICAL HISTORY: Past Surgical History:  Procedure Laterality Date  . BREAST CYST ASPIRATION Left    30 years ago   . LIPOSUCTION    . WISDOM TOOTH EXTRACTION     and all upper teeth    SOCIAL HISTORY: Social History  Substance Use Topics  . Smoking status: Former Smoker    Packs/day: 1.00    Years: 27.00    Types: Cigarettes  . Smokeless tobacco: Never Used  . Alcohol use 0.0 oz/week    FAMILY HISTORY: Family History  Problem Relation Age of Onset  . Irregular heart beat Mother   . Heart disease Mother   . Rheumatic fever Mother   . Hypertension Father   . Ulcers Father   . Hyperlipidemia Father   . Vision loss Brother   . Stroke Maternal Grandmother   . Diabetes Maternal Grandmother   . Hypertension Paternal Grandmother   . Ulcers Paternal  Grandfather     ROS: Review of Systems  Constitutional: Positive for malaise/Destiny.  HENT: Positive for congestion (nasal stuffiness), nosebleeds, sinus pain and tinnitus.        Hay Fever Nasal Discharge Dentures  Eyes:       Wear Glasses or Contacts  Respiratory: Positive for shortness of breath (with activity).   Cardiovascular: Negative for palpitations and orthopnea.       Calf/Leg Pain with Walking   Gastrointestinal: Positive for constipation and heartburn. Negative for nausea and vomiting.  Genitourinary: Positive for frequency.  Musculoskeletal: Positive for back pain.       Neck Stiffness Muscle or Joint Pain Muscle Stiffness Negative muscle weakness  Skin:       Dryness Hair or Nail Changes   Endo/Heme/Allergies:  Negative polyphagia Negative Heat/Cold Intolerance  Psychiatric/Behavioral: Positive for depression. The patient has insomnia.     PHYSICAL EXAM: Blood pressure 135/75, pulse 64, temperature 98 F (36.7 C), temperature source Oral, height 5\' 3"  (1.6 m), weight 226 lb (102.5 kg), SpO2 98 %. Body mass index is 40.03 kg/m. Physical Exam  Constitutional: She is oriented to person, place, and time. She appears well-developed and well-nourished.  Cardiovascular: Normal rate.   Pulmonary/Chest: Effort normal.  Musculoskeletal: Normal range of motion.  Neurological: She is oriented to person, place, and time.  Skin: Skin is warm and dry.  Psychiatric: She has a normal mood and affect. Her behavior is normal.  Vitals reviewed.   RECENT LABS AND TESTS: BMET    Component Value Date/Time   NA 139 12/21/2016 1654   K 4.2 12/21/2016 1654   CL 102 12/21/2016 1654   CO2 28 12/21/2016 1654   GLUCOSE 90 12/21/2016 1654   BUN 14 12/21/2016 1654   CREATININE 0.85 12/21/2016 1654   CALCIUM 9.9 12/21/2016 1654   Lab Results  Component Value Date   HGBA1C 5.7 12/21/2016   No results found for: INSULIN CBC    Component Value Date/Time   WBC  5.4 12/21/2016 1654   RBC 4.56 12/21/2016 1654   HGB 14.0 12/21/2016 1654   HCT 41.2 12/21/2016 1654   PLT 307.0 12/21/2016 1654   MCV 90.4 12/21/2016 1654   MCHC 33.9 12/21/2016 1654   RDW 13.1 12/21/2016 1654   LYMPHSABS 1.4 12/21/2016 1654   MONOABS 0.5 12/21/2016 1654   EOSABS 0.3 12/21/2016 1654   BASOSABS 0.1 12/21/2016 1654   Iron/TIBC/Ferritin/ %Sat No results found for: IRON, TIBC, FERRITIN, IRONPCTSAT Lipid Panel     Component Value Date/Time   CHOL 222 (H) 12/21/2016 1654   TRIG 240.0 (H) 12/21/2016 1654   HDL 42.50 12/21/2016 1654   CHOLHDL 5 12/21/2016 1654   VLDL 48.0 (H) 12/21/2016 1654   LDLDIRECT 146.0 12/21/2016 1654   Hepatic Function Panel     Component Value Date/Time   PROT 7.0 12/21/2016 1654   ALBUMIN 4.3 12/21/2016 1654   AST 22 12/21/2016 1654   ALT 26 12/21/2016 1654   ALKPHOS 66 12/21/2016 1654   BILITOT 0.3 12/21/2016 1654      Component Value Date/Time   TSH 4.25 12/21/2016 1654   TSH 2.86 07/16/2016 1526   TSH 5.10 (H) 05/11/2016 1108    ECG  shows NSR with a rate of 68 BPM INDIRECT CALORIMETER done today shows a VO2 of 175 and a REE of 1220.    ASSESSMENT AND PLAN: Other Destiny - Plan: EKG 12-Lead, Comprehensive metabolic panel, Lipid Panel With LDL/HDL Ratio, Destiny B12, Folate  Destiny on exertion  Other specified hypothyroidism  Destiny - Plan: Insulin, random  Destiny D deficiency - Plan: Destiny D 25 Hydroxy (Vit-D Deficiency, Fractures)  Class 3 obesity with serious comorbidity and body mass index (BMI) of 40.0 to 44.9 in adult, unspecified obesity type ([place])  PLAN: Destiny Batoul was informed that her Destiny may be related to obesity, depression or many other causes. Labs will be ordered, and in the meanwhile Exilda has agreed to work on diet, exercise and weight loss to help with Destiny. Proper sleep hygiene was discussed including the need for 7-8 hours of quality sleep each night. A sleep study was not  ordered based on symptoms and Epworth score.  Destiny on exertion Tiffane's shortness of breath appears to be obesity related and exercise induced. She  has agreed to work on weight loss and gradually increase exercise to treat her exercise induced shortness of breath. If Fiora follows our instructions and loses weight without improvement of her shortness of breath, we will plan to refer to pulmonology. We will monitor this condition regularly. Olivianna agrees to this plan.  Destiny D Deficiency Sreshta was informed that low Destiny D levels contributes to Destiny and are associated with obesity, breast, and colon cancer. She agrees to continue to take OTC Destiny D and we will check labs and will follow up for routine testing of Destiny D, at least 2-3 times per year. She was informed of the risk of over-replacement of Destiny D and agrees to not increase her dose unless he discusses this with [place] first. Jossette agrees to follow up with our clinic in 2 weeks.  Destiny Mayreli was informed of the importance of good thyroid control to help with weight loss efforts. She was also informed that supertheraputic thyroid levels are dangerous and will not improve weight loss results. Tephanie will continue to follow up with Dr. Cruzita Lederer and was advised her dose will likely change as she loses weight.  Destiny Fasting labs will be obtained and results with be discussed with Kemoni in 2 weeks at her follow up visit. In the meanwhile Jamileth was started on a lower simple carbohydrate diet and will work on weight loss efforts.  Depression Screen Araeya had a strongly positive depression screening. Depression is commonly associated with obesity and often results in emotional eating behaviors. We will monitor this closely and work on CBT to help improve the non-hunger eating patterns. Referral to Psychology may be required if no improvement is seen as she continues in our clinic.  Obesity Jaianna is  currently in the action stage of change and her goal is to continue with weight loss efforts. I recommend Laurabelle begin the structured treatment plan as follows:  She has agreed to follow the Category 2 plan Damika has been instructed to eventually work up to a goal of 150 minutes of combined cardio and strengthening exercise per week for weight loss and overall health benefits. We discussed the following Behavioral Modification Strategies today: increasing lean protein intake, meal planning & cooking strategies and keeping healthy foods in the home.  Cynthea has agreed to join our obesity program and follow up with our clinic in 2 weeks. She was informed of the importance of frequent follow up visits to maximize her success with intensive lifestyle modifications for her multiple health conditions. She was informed we would discuss her lab results at her next visit unless there is a critical issue that needs to be addressed sooner. Mckinley agreed to keep her next visit at the agreed upon time to discuss these results.  I, Doreene Nest, am acting as transcriptionist for Dennard Nip, MD  I have reviewed the above documentation for accuracy and completeness, and I agree with the above. -Dennard Nip, MD   OBESITY BEHAVIORAL INTERVENTION VISIT  Today's visit was # 1 out of 29.  Starting weight: 226 lbs Starting date: 12/24/16 Today's weight : 226 lbs Today's date: 12/24/2016 Total lbs lost to date: 0 (Patients must lose 7 lbs in the first 6 months to continue with counseling)   ASK: We discussed the diagnosis of obesity with Vesta today and Lynisha agreed to give [place] permission to discuss obesity behavioral modification therapy today.  ASSESS: Ladawn has the diagnosis of obesity and her BMI today is 40.1 Kinsley is  in the action stage of change   ADVISE: Iley was educated on the multiple health risks of obesity as well as the benefit of weight loss to improve her health.  She was advised of the need for long term treatment and the importance of lifestyle modifications.  AGREE: Multiple dietary modification options and treatment options were discussed and  Shoni agreed to follow the Category 2 plan We discussed the following Behavioral Modification Strategies today: increasing lean protein intake, meal planning & cooking strategies and keeping healthy foods in the home

## 2016-12-24 NOTE — Telephone Encounter (Signed)
Received Medical records from [place], forwarded to provider/SLS

## 2016-12-25 LAB — COMPREHENSIVE METABOLIC PANEL
ALBUMIN: 4.5 g/dL (ref 3.6–4.8)
ALT: 29 IU/L (ref 0–32)
AST: 23 IU/L (ref 0–40)
Albumin/Globulin Ratio: 1.9 (ref 1.2–2.2)
Alkaline Phosphatase: 79 IU/L (ref 39–117)
BUN/Creatinine Ratio: 13 (ref 12–28)
BUN: 11 mg/dL (ref 8–27)
Bilirubin Total: 0.3 mg/dL (ref 0.0–1.2)
CALCIUM: 9.6 mg/dL (ref 8.7–10.3)
CO2: 22 mmol/L (ref 20–29)
CREATININE: 0.82 mg/dL (ref 0.57–1.00)
Chloride: 102 mmol/L (ref 96–106)
GFR calc Af Amer: 87 mL/min/{1.73_m2} (ref >59)
GFR, EST NON AFRICAN AMERICAN: 76 mL/min/{1.73_m2} (ref >59)
GLOBULIN, TOTAL: 2.4 g/dL (ref 1.5–4.5)
GLUCOSE: 112 mg/dL — AB (ref 65–99)
Potassium: 4.5 mmol/L (ref 3.5–5.2)
SODIUM: 138 mmol/L (ref 134–144)
Total Protein: 6.9 g/dL (ref 6.0–8.5)

## 2016-12-25 LAB — VITAMIN D 25 HYDROXY (VIT D DEFICIENCY, FRACTURES): Vit D, 25-Hydroxy: 24.7 ng/mL — ABNORMAL LOW (ref 30.0–100.0)

## 2016-12-25 LAB — LIPID PANEL WITH LDL/HDL RATIO
CHOLESTEROL TOTAL: 200 mg/dL — AB (ref 100–199)
HDL: 41 mg/dL (ref >39)
LDL CALC: 128 mg/dL — AB (ref 0–99)
LDl/HDL Ratio: 3.1 ratio (ref 0.0–3.2)
Triglycerides: 155 mg/dL — ABNORMAL HIGH (ref 0–149)
VLDL CHOLESTEROL CAL: 31 mg/dL (ref 5–40)

## 2016-12-25 LAB — VITAMIN B12

## 2016-12-25 LAB — FOLATE: Folate: 6.6 ng/mL (ref >3.0)

## 2016-12-25 LAB — INSULIN, RANDOM: INSULIN: 42.5 u[IU]/mL — AB (ref 2.6–24.9)

## 2017-01-06 ENCOUNTER — Encounter: Payer: Self-pay | Admitting: Family Medicine

## 2017-01-07 ENCOUNTER — Ambulatory Visit (INDEPENDENT_AMBULATORY_CARE_PROVIDER_SITE_OTHER): Admitting: Family Medicine

## 2017-01-07 ENCOUNTER — Other Ambulatory Visit: Payer: Self-pay | Admitting: Family Medicine

## 2017-01-07 VITALS — BP 121/75 | HR 60 | Temp 98.2°F | Ht 63.0 in | Wt 216.0 lb

## 2017-01-07 DIAGNOSIS — E669 Obesity, unspecified: Secondary | ICD-10-CM | POA: Diagnosis not present

## 2017-01-07 DIAGNOSIS — E559 Vitamin D deficiency, unspecified: Secondary | ICD-10-CM | POA: Insufficient documentation

## 2017-01-07 DIAGNOSIS — E782 Mixed hyperlipidemia: Secondary | ICD-10-CM | POA: Insufficient documentation

## 2017-01-07 DIAGNOSIS — R7303 Prediabetes: Secondary | ICD-10-CM

## 2017-01-07 DIAGNOSIS — Z6838 Body mass index (BMI) 38.0-38.9, adult: Secondary | ICD-10-CM

## 2017-01-07 DIAGNOSIS — I10 Essential (primary) hypertension: Secondary | ICD-10-CM

## 2017-01-07 DIAGNOSIS — IMO0001 Reserved for inherently not codable concepts without codable children: Secondary | ICD-10-CM | POA: Insufficient documentation

## 2017-01-07 MED ORDER — VITAMIN D (ERGOCALCIFEROL) 1.25 MG (50000 UNIT) PO CAPS: 50000.0000 [IU] | ORAL_CAPSULE | ORAL | 0 refills | Status: DC

## 2017-01-07 MED ORDER — METOPROLOL SUCCINATE ER 25 MG PO TB24: 25.0000 mg | ORAL_TABLET | Freq: Every day | ORAL | 3 refills | Status: DC

## 2017-01-07 MED ORDER — METFORMIN HCL 500 MG PO TABS: 500.0000 mg | ORAL_TABLET | Freq: Every day | ORAL | 0 refills | Status: DC

## 2017-01-07 NOTE — Progress Notes (Signed)
Office: [phone]  /  Fax: [phone]   HPI:   Chief Complaint: OBESITY Destiny [name] is here to discuss her progress with her obesity treatment plan. She is on the Category 2 plan and is following her eating plan approximately 99 % of the time. She states she is exercising 0 minutes 0 times per week. Myrissa has done well with weight loss on the category 2 plan. Hunger is controlled and she worked to eat all her food on the category 2 plan. She did get bored with the food over 2 weeks. She is going on vacation and wants advice on how to not gain weight while traveling. Her weight is 216 lb (98 kg) today and has had a weight loss of 10 pounds over a period of 2 weeks since her last visit. She has lost 10 lbs since starting treatment with [place].  Vitamin D deficiency Destiny [name] has a diagnosis of vitamin D deficiency. She is currently taking OTC vit D and not yet at goal. She admits fatigue and denies nausea, vomiting or muscle weakness.  Pre-Diabetes Destiny [name] has a new diagnosis of pre-diabetes based on her elevated fasting glucose, Hgb A1c and very elevated fasting insulin and she was informed this puts her at greater risk of developing diabetes. She is not taking metformin currently and continues to work on diet and exercise to decrease risk of diabetes. She admits polyphagia and denies nausea or hypoglycemia. Destiny [name] has a strong family history of diabetes.  Mixed Hyperlipidemia Destiny [name] has hyperlipidemia, HDL is at 41, LDL is at 128 and triglycerides are at 155 and she is not currently on a statin. She would like to control her cholesterol levels with intensive lifestyle modification including a low saturated fat diet, exercise and weight loss. She denies any chest pain, claudication or myalgias.  ALLERGIES: Allergies  Allergen Reactions  . Diclofenac Itching, Nausea Only and Other (See Comments)    Wheezing  . Hydrochlorothiazide     Wheezing. Losing teeth.  . Statins      MEDICATIONS: Current Outpatient Prescriptions on File Prior to Visit  Medication Sig Dispense Refill  . ARMOUR THYROID 90 MG tablet Take 1 tablet daily. 90 tablet 3  . Calcium-Magnesium 500-250 MG TABS Take by mouth.    . Cholecalciferol (VITAMIN D PO) Take by mouth.    . COD LIVER OIL PO Take by mouth. Reported on 07/16/2015    . Cyanocobalamin (VITAMIN B-12) 5000 MCG TBDP Take 5,000 mcg by mouth daily.    . irbesartan (AVAPRO) 150 MG tablet Take 75 mg by mouth daily.    . metoprolol succinate (TOPROL-XL) 25 MG 24 hr tablet Take 1 tablet (25 mg total) by mouth daily. 90 tablet 3  . Probiotic Product (PROBIOTIC-10) CAPS Take by mouth.    . ranitidine (ZANTAC) 150 MG capsule Take 150 mg by mouth daily as needed for heartburn.    . RED YEAST RICE EXTRACT PO Take by mouth. Reported on 07/16/2015    . Wheat Dextrin (BENEFIBER DRINK MIX PO) Take by mouth.     No current facility-administered medications on file prior to visit.     PAST MEDICAL HISTORY: Past Medical History:  Diagnosis Date  . Abnormal blood chemistry   . Arthropathy   . Back pain   . Breast cyst   . Cellulitis   . Chest discomfort   . Constipation   . Disturbance of skin sensation   . Edema   . Encounter for screening and preventative care 06/17/2016  .  GERD (gastroesophageal reflux disease)   . Hiatal hernia with gastroesophageal reflux 06/17/2016  . High risk medication use   . Hoarseness 06/15/2016  . Hyperglycemia   . Hyperglycemia 12/21/2016  . Hyperlipidemia   . Hyperlipidemia, mixed 06/15/2016  . Hypertension   . Hypopotassemia   . Hypothyroidism   . Joint pain   . Menopause   . Obesity   . Palpitations   . Positive ANA (antinuclear antibody)   . Shoulder pain, right   . Sleep apnea   . SOB (shortness of breath)   . Thyroid disease 12/11/2014  . Thyroid nodule   . Vitamin D deficiency   . Vitamin D deficiency     PAST SURGICAL HISTORY: Past Surgical History:  Procedure Laterality Date  . BREAST  CYST ASPIRATION Left    30 years ago   . LIPOSUCTION    . WISDOM TOOTH EXTRACTION     and all upper teeth    SOCIAL HISTORY: Social History  Substance Use Topics  . Smoking status: Former Smoker    Packs/day: 1.00    Years: 27.00    Types: Cigarettes  . Smokeless tobacco: Never Used  . Alcohol use 0.0 oz/week    FAMILY HISTORY: Family History  Problem Relation Age of Onset  . Irregular heart beat Mother   . Heart disease Mother   . Rheumatic fever Mother   . Hypertension Father   . Ulcers Father   . Hyperlipidemia Father   . Vision loss Brother   . Stroke Maternal Grandmother   . Diabetes Maternal Grandmother   . Hypertension Paternal Grandmother   . Ulcers Paternal Grandfather     ROS: Review of Systems  Constitutional: Positive for malaise/fatigue and weight loss.  Cardiovascular: Negative for chest pain and claudication.  Gastrointestinal: Negative for nausea and vomiting.  Musculoskeletal: Negative for myalgias.       Negative muscle weakness  Endo/Heme/Allergies:       Polyphagia Negative hypoglycemia    PHYSICAL EXAM: Blood pressure 121/75, pulse 60, temperature 98.2 F (36.8 C), temperature source Oral, height 5\' 3"  (1.6 m), weight 216 lb (98 kg), SpO2 98 %. Body mass index is 38.26 kg/m. Physical Exam  Constitutional: She is oriented to person, place, and time. She appears well-developed and well-nourished.  Cardiovascular: Normal rate.   Pulmonary/Chest: Effort normal.  Musculoskeletal: Normal range of motion.  Neurological: She is oriented to person, place, and time.  Skin: Skin is warm and dry.  Psychiatric: She has a normal mood and affect. Her behavior is normal.  Vitals reviewed.   RECENT LABS AND TESTS: BMET    Component Value Date/Time   NA 138 12/24/2016 0954   K 4.5 12/24/2016 0954   CL 102 12/24/2016 0954   CO2 22 12/24/2016 0954   GLUCOSE 112 (H) 12/24/2016 0954   GLUCOSE 90 12/21/2016 1654   BUN 11 12/24/2016 0954    CREATININE 0.82 12/24/2016 0954   CALCIUM 9.6 12/24/2016 0954   GFRNONAA 76 12/24/2016 0954   GFRAA 87 12/24/2016 0954   Lab Results  Component Value Date   HGBA1C 5.7 12/21/2016   Lab Results  Component Value Date   INSULIN 42.5 (H) 12/24/2016   CBC    Component Value Date/Time   WBC 5.4 12/21/2016 1654   RBC 4.56 12/21/2016 1654   HGB 14.0 12/21/2016 1654   HCT 41.2 12/21/2016 1654   PLT 307.0 12/21/2016 1654   MCV 90.4 12/21/2016 1654   MCHC 33.9 12/21/2016 1654  RDW 13.1 12/21/2016 1654   LYMPHSABS 1.4 12/21/2016 1654   MONOABS 0.5 12/21/2016 1654   EOSABS 0.3 12/21/2016 1654   BASOSABS 0.1 12/21/2016 1654   Iron/TIBC/Ferritin/ %Sat No results found for: IRON, TIBC, FERRITIN, IRONPCTSAT Lipid Panel     Component Value Date/Time   CHOL 200 (H) 12/24/2016 0954   TRIG 155 (H) 12/24/2016 0954   HDL 41 12/24/2016 0954   CHOLHDL 5 12/21/2016 1654   VLDL 48.0 (H) 12/21/2016 1654   LDLCALC 128 (H) 12/24/2016 0954   LDLDIRECT 146.0 12/21/2016 1654   Hepatic Function Panel     Component Value Date/Time   PROT 6.9 12/24/2016 0954   ALBUMIN 4.5 12/24/2016 0954   AST 23 12/24/2016 0954   ALT 29 12/24/2016 0954   ALKPHOS 79 12/24/2016 0954   BILITOT 0.3 12/24/2016 0954      Component Value Date/Time   TSH 4.25 12/21/2016 1654   TSH 2.86 07/16/2016 1526   TSH 5.10 (H) 05/11/2016 1108    ASSESSMENT AND PLAN: Vitamin D deficiency - Plan: Vitamin D, Ergocalciferol, (DRISDOL) 50000 units CAPS capsule  Mixed hyperlipidemia  Prediabetes - Plan: metFORMIN (GLUCOPHAGE) 500 MG tablet  Class 2 obesity with serious comorbidity and body mass index (BMI) of 38.0 to 38.9 in adult, unspecified obesity type  PLAN:  Vitamin D Deficiency Destiny [name] was informed that low vitamin D levels contributes to fatigue and are associated with obesity, breast, and colon cancer. She agrees to start to take prescription Vit D @50 ,000 IU every week #4 with no refills and will follow up for  routine testing of vitamin D, at least 2-3 times per year. She was informed of the risk of over-replacement of vitamin D and agrees to not increase her dose unless he discusses this with [place] first. She agrees to follow up with our clinic in 2 weeks.  Pre-Diabetes Destiny [name] will continue to work on weight loss, exercise, and decreasing simple carbohydrates in her diet to help decrease the risk of diabetes. We dicussed metformin including benefits and risks. She was informed that eating too many simple carbohydrates or too many calories at one sitting increases the likelihood of GI side effects. Destiny [name] agrees to start metformin 500 mg every morning #30 with no refills and Destiny [name] agreed to follow up with [place] as directed to monitor her progress.  Hyperlipidemia Destiny [name] was informed of the American Heart Association Guidelines emphasizing intensive lifestyle modifications as the first line treatment for hyperlipidemia. We discussed many lifestyle modifications today in depth, and Destiny [name] will continue to work on decreasing saturated fats such as fatty red meat, butter and many fried foods. She will also increase vegetables and lean protein in her diet and continue to work on exercise and weight loss efforts. We will re-check labs in 3 months and Destiny [name] agrees to follow up with our clinic in 2 weeks.  Obesity Destiny [name] is currently in the action stage of change. As such, her goal is to continue with weight loss efforts She has agreed to follow the Category 2 plan +options Destiny [name] has been instructed to work up to a goal of 150 minutes of combined cardio and strengthening exercise per week for weight loss and overall health benefits. We discussed the following Behavioral Modification Strategies today: no skipping meals, meal planning & cooking strategies, travel eating strategies, celebration eating strategies, increasing lean protein intake and decreasing simple carbohydrates   Destiny [name] has agreed to follow up  with our clinic in 2 weeks. She was informed of the  importance of frequent follow up visits to maximize her success with intensive lifestyle modifications for her multiple health conditions.  Destiny [name], Destiny [name], am acting as transcriptionist for Destiny Nip, MD  Destiny [name] have reviewed the above documentation for accuracy and completeness, and Destiny [name] agree with the above. -Destiny Nip, MD  OBESITY BEHAVIORAL INTERVENTION VISIT  Today's visit was # 2 out of 5.  Starting weight: 226 lbs Starting date: 12/24/16 Today's weight : 216 lbs Today's date: 01/07/2017 Total lbs lost to date: 10 (Patients must lose 7 lbs in the first 6 months to continue with counseling)   ASK: We discussed the diagnosis of obesity with Destiny [name] today and Destiny [name] agreed to give [place] permission to discuss obesity behavioral modification therapy today.  ASSESS: Destiny [name] has the diagnosis of obesity and her BMI today is 38.3 Destiny [name] is in the action stage of change   ADVISE: Destiny [name] was educated on the multiple health risks of obesity as well as the benefit of weight loss to improve her health. She was advised of the need for long term treatment and the importance of lifestyle modifications.  AGREE: Multiple dietary modification options and treatment options were discussed and  Destiny [name] agreed to follow the Category 2 plan with options We discussed the following Behavioral Modification Strategies today: no skipping meals, meal planning & cooking strategies, travel eating strategies, celebration eating strategies, increasing lean protein intake and decreasing simple carbohydrates

## 2017-01-08 ENCOUNTER — Encounter (INDEPENDENT_AMBULATORY_CARE_PROVIDER_SITE_OTHER): Payer: Self-pay | Admitting: Family Medicine

## 2017-01-08 ENCOUNTER — Other Ambulatory Visit: Payer: Self-pay | Admitting: Family Medicine

## 2017-01-08 MED ORDER — IRBESARTAN 150 MG PO TABS: 150.0000 mg | ORAL_TABLET | Freq: Every day | ORAL | 3 refills | Status: DC

## 2017-01-08 MED ORDER — IRBESARTAN 150 MG PO TABS: 150.0000 mg | ORAL_TABLET | Freq: Every day | ORAL | 1 refills | Status: DC

## 2017-01-14 ENCOUNTER — Ambulatory Visit: Admitting: Internal Medicine

## 2017-01-21 NOTE — Telephone Encounter (Signed)
Complete

## 2017-01-25 ENCOUNTER — Ambulatory Visit (INDEPENDENT_AMBULATORY_CARE_PROVIDER_SITE_OTHER): Admitting: Physician Assistant

## 2017-01-25 VITALS — BP 134/80 | HR 57 | Temp 97.6°F | Ht 63.0 in | Wt 213.0 lb

## 2017-01-25 DIAGNOSIS — R7303 Prediabetes: Secondary | ICD-10-CM

## 2017-01-25 DIAGNOSIS — IMO0001 Reserved for inherently not codable concepts without codable children: Secondary | ICD-10-CM

## 2017-01-25 DIAGNOSIS — Z6837 Body mass index (BMI) 37.0-37.9, adult: Secondary | ICD-10-CM

## 2017-01-25 DIAGNOSIS — E669 Obesity, unspecified: Secondary | ICD-10-CM | POA: Diagnosis not present

## 2017-01-25 NOTE — Progress Notes (Addendum)
Office: [phone]  /  Fax: [phone]   HPI:   Chief Complaint: OBESITY Destiny [name] is here to discuss her progress with her obesity treatment plan. She is on the Category 2 plan and is following her eating plan approximately 50 to 75 % of the time. She states she is exercising 0 minutes 0 times per week. Destiny [name] continues to do well with weight loss. She was on vacation and ate out more but still made smarter food choices and controlled her portions. Her weight is 213 lb (96.6 kg) today and has had a weight loss of 3 pounds over a period of 2 weeks since her last visit. She has lost 13 lbs since starting treatment with [place].  Pre-Diabetes Destiny [name] has a diagnosis of pre-diabetes based on her elevated Hgb A1c and was informed this puts her at greater risk of developing diabetes. She is taking metformin currently and continues to work on diet and exercise to decrease risk of diabetes. She denies nausea, polyphagia or hypoglycemia.    ALLERGIES: Allergies  Allergen Reactions  . Diclofenac Itching, Nausea Only and Other (See Comments)    Wheezing  . Hydrochlorothiazide     Wheezing. Losing teeth.  . Statins     MEDICATIONS: Current Outpatient Prescriptions on File Prior to Visit  Medication Sig Dispense Refill  . ARMOUR THYROID 90 MG tablet Take 1 tablet daily. 90 tablet 3  . Calcium-Magnesium 500-250 MG TABS Take by mouth.    . Cholecalciferol (VITAMIN D PO) Take by mouth.    . COD LIVER OIL PO Take by mouth. Reported on 07/16/2015    . Cyanocobalamin (VITAMIN B-12) 5000 MCG TBDP Take 5,000 mcg by mouth daily.    . irbesartan (AVAPRO) 150 MG tablet Take 1 tablet (150 mg total) by mouth daily. 90 tablet 3  . metFORMIN (GLUCOPHAGE) 500 MG tablet Take 1 tablet (500 mg total) by mouth daily with breakfast. 30 tablet 0  . metoprolol succinate (TOPROL-XL) 25 MG 24 hr tablet Take 1 tablet (25 mg total) by mouth daily. 90 tablet 3  . Probiotic Product (PROBIOTIC-10) CAPS Take by mouth.      . ranitidine (ZANTAC) 150 MG capsule Take 150 mg by mouth daily as needed for heartburn.    . RED YEAST RICE EXTRACT PO Take by mouth. Reported on 07/16/2015    . Vitamin D, Ergocalciferol, (DRISDOL) 50000 units CAPS capsule Take 1 capsule (50,000 Units total) by mouth every 7 (seven) days. 4 capsule 0  . Wheat Dextrin (BENEFIBER DRINK MIX PO) Take by mouth.     No current facility-administered medications on file prior to visit.     PAST MEDICAL HISTORY: Past Medical History:  Diagnosis Date  . Abnormal blood chemistry   . Arthropathy   . Back pain   . Breast cyst   . Cellulitis   . Chest discomfort   . Constipation   . Disturbance of skin sensation   . Edema   . Encounter for screening and preventative care 06/17/2016  . GERD (gastroesophageal reflux disease)   . Hiatal hernia with gastroesophageal reflux 06/17/2016  . High risk medication use   . Hoarseness 06/15/2016  . Hyperglycemia   . Hyperglycemia 12/21/2016  . Hyperlipidemia   . Hyperlipidemia, mixed 06/15/2016  . Hypertension   . Hypopotassemia   . Hypothyroidism   . Joint pain   . Menopause   . Obesity   . Palpitations   . Positive ANA (antinuclear antibody)   . Shoulder pain, right   .  Sleep apnea   . SOB (shortness of breath)   . Thyroid disease 12/11/2014  . Thyroid nodule   . Vitamin D deficiency   . Vitamin D deficiency     PAST SURGICAL HISTORY: Past Surgical History:  Procedure Laterality Date  . BREAST CYST ASPIRATION Left    30 years ago   . LIPOSUCTION    . WISDOM TOOTH EXTRACTION     and all upper teeth    SOCIAL HISTORY: Social History  Substance Use Topics  . Smoking status: Former Smoker    Packs/day: 1.00    Years: 27.00    Types: Cigarettes  . Smokeless tobacco: Never Used  . Alcohol use 0.0 oz/week    FAMILY HISTORY: Family History  Problem Relation Age of Onset  . Irregular heart beat Mother   . Heart disease Mother   . Rheumatic fever Mother   . Hypertension Father   .  Ulcers Father   . Hyperlipidemia Father   . Vision loss Brother   . Stroke Maternal Grandmother   . Diabetes Maternal Grandmother   . Hypertension Paternal Grandmother   . Ulcers Paternal Grandfather     ROS: Review of Systems  Constitutional: Positive for weight loss.  Gastrointestinal: Negative for nausea.  Endo/Heme/Allergies:       Negative hypoglycemia Negative polyphagia    PHYSICAL EXAM: Blood pressure 134/80, pulse (!) 57, temperature 97.6 F (36.4 C), temperature source Oral, height 5\' 3"  (1.6 m), weight 213 lb (96.6 kg), SpO2 98 %. Body mass index is 37.73 kg/m. Physical Exam  Constitutional: She is oriented to person, place, and time. She appears well-developed and well-nourished.  Cardiovascular:  bradycardic  Pulmonary/Chest: Effort normal.  Musculoskeletal: Normal range of motion.  Neurological: She is oriented to person, place, and time.  Skin: Skin is warm and dry.  Psychiatric: She has a normal mood and affect. Her behavior is normal.  Vitals reviewed.   RECENT LABS AND TESTS: BMET    Component Value Date/Time   NA 138 12/24/2016 0954   K 4.5 12/24/2016 0954   CL 102 12/24/2016 0954   CO2 22 12/24/2016 0954   GLUCOSE 112 (H) 12/24/2016 0954   GLUCOSE 90 12/21/2016 1654   BUN 11 12/24/2016 0954   CREATININE 0.82 12/24/2016 0954   CALCIUM 9.6 12/24/2016 0954   GFRNONAA 76 12/24/2016 0954   GFRAA 87 12/24/2016 0954   Lab Results  Component Value Date   HGBA1C 5.7 12/21/2016   Lab Results  Component Value Date   INSULIN 42.5 (H) 12/24/2016   CBC    Component Value Date/Time   WBC 5.4 12/21/2016 1654   RBC 4.56 12/21/2016 1654   HGB 14.0 12/21/2016 1654   HCT 41.2 12/21/2016 1654   PLT 307.0 12/21/2016 1654   MCV 90.4 12/21/2016 1654   MCHC 33.9 12/21/2016 1654   RDW 13.1 12/21/2016 1654   LYMPHSABS 1.4 12/21/2016 1654   MONOABS 0.5 12/21/2016 1654   EOSABS 0.3 12/21/2016 1654   BASOSABS 0.1 12/21/2016 1654   Iron/TIBC/Ferritin/  %Sat No results found for: IRON, TIBC, FERRITIN, IRONPCTSAT Lipid Panel     Component Value Date/Time   CHOL 200 (H) 12/24/2016 0954   TRIG 155 (H) 12/24/2016 0954   HDL 41 12/24/2016 0954   CHOLHDL 5 12/21/2016 1654   VLDL 48.0 (H) 12/21/2016 1654   LDLCALC 128 (H) 12/24/2016 0954   LDLDIRECT 146.0 12/21/2016 1654   Hepatic Function Panel     Component Value Date/Time  PROT 6.9 12/24/2016 0954   ALBUMIN 4.5 12/24/2016 0954   AST 23 12/24/2016 0954   ALT 29 12/24/2016 0954   ALKPHOS 79 12/24/2016 0954   BILITOT 0.3 12/24/2016 0954      Component Value Date/Time   TSH 4.25 12/21/2016 1654   TSH 2.86 07/16/2016 1526   TSH 5.10 (H) 05/11/2016 1108    ASSESSMENT AND PLAN: Prediabetes  Class 2 obesity with serious comorbidity and body mass index (BMI) of 37.0 to 37.9 in adult, unspecified obesity type  PLAN:  Pre-Diabetes Brendolyn will continue to work on weight loss, exercise, and decreasing simple carbohydrates in her diet to help decrease the risk of diabetes. We dicussed metformin including benefits and risks. She was informed that eating too many simple carbohydrates or too many calories at one sitting increases the likelihood of GI side effects. Kolbie agrees to contine metformin for now and a prescription was not written today. Chery agreed to follow up with [place] as directed to monitor her progress.  We spent > than 50% of the 15 minute visit on the counseling as documented in the note.  Obesity Avanni is currently in the action stage of change. As such, her goal is to continue with weight loss efforts She has agreed to follow the Category 2 plan Min has been instructed to work up to a goal of 150 minutes of combined cardio and strengthening exercise per week for weight loss and overall health benefits. We discussed the following Behavioral Modification Strategies today: planning for success and increasing lean protein intake  Dannya has agreed to follow up  with our clinic in 2 weeks. She was informed of the importance of frequent follow up visits to maximize her success with intensive lifestyle modifications for her multiple health conditions.  I, Doreene Nest, am acting as transcriptionist for Lacy Duverney, PA-C  I have reviewed the above documentation for accuracy and completeness, and I agree with the above. -Lacy Duverney, PA-C   OBESITY BEHAVIORAL INTERVENTION VISIT  Today's visit was # 3 out of 22.  Starting weight: 226 lbs Starting date: 12/24/16 Today's weight : 213 lbs Today's date: 01/25/2017 Total lbs lost to date: 13 (Patients must lose 7 lbs in the first 6 months to continue with counseling)   ASK: We discussed the diagnosis of obesity with [place] today and Landry agreed to give [place] permission to discuss obesity behavioral modification therapy today.  ASSESS: Kimberlee has the diagnosis of obesity and her BMI today is 37.8 Candi is in the action stage of change   ADVISE: Cieanna was educated on the multiple health risks of obesity as well as the benefit of weight loss to improve her health. She was advised of the need for long term treatment and the importance of lifestyle modifications.  AGREE: Multiple dietary modification options and treatment options were discussed and  Adaria agreed to follow the Category 2 plan We discussed the following Behavioral Modification Strategies today: planning for success and increasing lean protein intake

## 2017-01-30 ENCOUNTER — Encounter (INDEPENDENT_AMBULATORY_CARE_PROVIDER_SITE_OTHER): Payer: Self-pay | Admitting: Family Medicine

## 2017-02-01 ENCOUNTER — Other Ambulatory Visit (INDEPENDENT_AMBULATORY_CARE_PROVIDER_SITE_OTHER): Payer: Self-pay

## 2017-02-01 ENCOUNTER — Encounter (INDEPENDENT_AMBULATORY_CARE_PROVIDER_SITE_OTHER): Payer: Self-pay

## 2017-02-01 DIAGNOSIS — E559 Vitamin D deficiency, unspecified: Secondary | ICD-10-CM

## 2017-02-01 MED ORDER — VITAMIN D (ERGOCALCIFEROL) 1.25 MG (50000 UNIT) PO CAPS: 50000.0000 [IU] | ORAL_CAPSULE | ORAL | 0 refills | Status: DC

## 2017-02-10 ENCOUNTER — Ambulatory Visit (INDEPENDENT_AMBULATORY_CARE_PROVIDER_SITE_OTHER): Admitting: Family Medicine

## 2017-02-10 VITALS — BP 113/76 | HR 64 | Temp 98.2°F | Ht 63.0 in | Wt 211.0 lb

## 2017-02-10 DIAGNOSIS — Z6837 Body mass index (BMI) 37.0-37.9, adult: Secondary | ICD-10-CM

## 2017-02-10 DIAGNOSIS — E669 Obesity, unspecified: Secondary | ICD-10-CM | POA: Diagnosis not present

## 2017-02-10 DIAGNOSIS — IMO0001 Reserved for inherently not codable concepts without codable children: Secondary | ICD-10-CM

## 2017-02-10 DIAGNOSIS — R7303 Prediabetes: Secondary | ICD-10-CM | POA: Diagnosis not present

## 2017-02-10 MED ORDER — METFORMIN HCL 500 MG PO TABS: 500.0000 mg | ORAL_TABLET | Freq: Every day | ORAL | 0 refills | Status: DC

## 2017-02-10 NOTE — Progress Notes (Signed)
Office: [phone]  /  Fax: [phone]   HPI:   Chief Complaint: OBESITY Destiny [name] is here to discuss her progress with her obesity treatment plan. She is on the  follow the Category 2 plan and is following her eating plan approximately 85 % of the time. She states she is exercising 0 minutes 0 times per week. Destiny [name] continues to do well with weight loss. She is getting bored with breakfast. Her weight is 211 lb (95.7 kg) today and has had a weight loss of 2 pounds over a period of 2 weeks since her last visit. She has lost 15 lbs since starting treatment with [place].  Pre-Diabetes Destiny [name] has a diagnosis of pre-diabetes based on her elevated Hgb A1c and was informed this puts her at greater risk of developing diabetes. She is taking metformin currently and is attempting to improve with diet and weight loss to decrease risk of diabetes. She still notes polyphagia but this has improved and she denies nausea or hypoglycemia.  ALLERGIES: Allergies  Allergen Reactions  . Diclofenac Itching, Nausea Only and Other (See Comments)    Wheezing  . Hydrochlorothiazide     Wheezing. Losing teeth.  . Statins     MEDICATIONS: Current Outpatient Prescriptions on File Prior to Visit  Medication Sig Dispense Refill  . ARMOUR THYROID 90 MG tablet Take 1 tablet daily. 90 tablet 3  . Calcium-Magnesium 500-250 MG TABS Take by mouth.    . Cholecalciferol (VITAMIN D PO) Take by mouth.    . COD LIVER OIL PO Take by mouth. Reported on 07/16/2015    . Cyanocobalamin (VITAMIN B-12) 5000 MCG TBDP Take 5,000 mcg by mouth daily.    . irbesartan (AVAPRO) 150 MG tablet Take 1 tablet (150 mg total) by mouth daily. 90 tablet 3  . metFORMIN (GLUCOPHAGE) 500 MG tablet Take 1 tablet (500 mg total) by mouth daily with breakfast. 30 tablet 0  . metoprolol succinate (TOPROL-XL) 25 MG 24 hr tablet Take 1 tablet (25 mg total) by mouth daily. 90 tablet 3  . Probiotic Product (PROBIOTIC-10) CAPS Take by mouth.    .  ranitidine (ZANTAC) 150 MG capsule Take 150 mg by mouth daily as needed for heartburn.    . RED YEAST RICE EXTRACT PO Take by mouth. Reported on 07/16/2015    . Vitamin D, Ergocalciferol, (DRISDOL) 50000 units CAPS capsule Take 1 capsule (50,000 Units total) by mouth every 7 (seven) days. 4 capsule 0  . Wheat Dextrin (BENEFIBER DRINK MIX PO) Take by mouth.     No current facility-administered medications on file prior to visit.     PAST MEDICAL HISTORY: Past Medical History:  Diagnosis Date  . Abnormal blood chemistry   . Arthropathy   . Back pain   . Breast cyst   . Cellulitis   . Chest discomfort   . Constipation   . Disturbance of skin sensation   . Edema   . Encounter for screening and preventative care 06/17/2016  . GERD (gastroesophageal reflux disease)   . Hiatal hernia with gastroesophageal reflux 06/17/2016  . High risk medication use   . Hoarseness 06/15/2016  . Hyperglycemia   . Hyperglycemia 12/21/2016  . Hyperlipidemia   . Hyperlipidemia, mixed 06/15/2016  . Hypertension   . Hypopotassemia   . Hypothyroidism   . Joint pain   . Menopause   . Obesity   . Palpitations   . Positive ANA (antinuclear antibody)   . Shoulder pain, right   . Sleep  apnea   . SOB (shortness of breath)   . Thyroid disease 12/11/2014  . Thyroid nodule   . Vitamin D deficiency   . Vitamin D deficiency     PAST SURGICAL HISTORY: Past Surgical History:  Procedure Laterality Date  . BREAST CYST ASPIRATION Left    30 years ago   . LIPOSUCTION    . WISDOM TOOTH EXTRACTION     and all upper teeth    SOCIAL HISTORY: Social History  Substance Use Topics  . Smoking status: Former Smoker    Packs/day: 1.00    Years: 27.00    Types: Cigarettes  . Smokeless tobacco: Never Used  . Alcohol use 0.0 oz/week    FAMILY HISTORY: Family History  Problem Relation Age of Onset  . Irregular heart beat Mother   . Heart disease Mother   . Rheumatic fever Mother   . Hypertension Father   . Ulcers  Father   . Hyperlipidemia Father   . Vision loss Brother   . Stroke Maternal Grandmother   . Diabetes Maternal Grandmother   . Hypertension Paternal Grandmother   . Ulcers Paternal Grandfather     ROS: Review of Systems  Constitutional: Positive for weight loss.  Gastrointestinal: Negative for nausea.  Endo/Heme/Allergies:       Polyphagia Negative hypoglycemia    PHYSICAL EXAM: Blood pressure 113/76, pulse 64, temperature 98.2 F (36.8 C), temperature source Oral, height 5\' 3"  (1.6 m), weight 211 lb (95.7 kg), SpO2 98 %. Body mass index is 37.38 kg/m. Physical Exam  Constitutional: She is oriented to person, place, and time. She appears well-developed and well-nourished.  Cardiovascular: Normal rate.   Pulmonary/Chest: Effort normal.  Musculoskeletal: Normal range of motion.  Neurological: She is oriented to person, place, and time.  Skin: Skin is warm and dry.  Psychiatric: She has a normal mood and affect. Her behavior is normal.  Vitals reviewed.   RECENT LABS AND TESTS: BMET    Component Value Date/Time   NA 138 12/24/2016 0954   K 4.5 12/24/2016 0954   CL 102 12/24/2016 0954   CO2 22 12/24/2016 0954   GLUCOSE 112 (H) 12/24/2016 0954   GLUCOSE 90 12/21/2016 1654   BUN 11 12/24/2016 0954   CREATININE 0.82 12/24/2016 0954   CALCIUM 9.6 12/24/2016 0954   GFRNONAA 76 12/24/2016 0954   GFRAA 87 12/24/2016 0954   Lab Results  Component Value Date   HGBA1C 5.7 12/21/2016   Lab Results  Component Value Date   INSULIN 42.5 (H) 12/24/2016   CBC    Component Value Date/Time   WBC 5.4 12/21/2016 1654   RBC 4.56 12/21/2016 1654   HGB 14.0 12/21/2016 1654   HCT 41.2 12/21/2016 1654   PLT 307.0 12/21/2016 1654   MCV 90.4 12/21/2016 1654   MCHC 33.9 12/21/2016 1654   RDW 13.1 12/21/2016 1654   LYMPHSABS 1.4 12/21/2016 1654   MONOABS 0.5 12/21/2016 1654   EOSABS 0.3 12/21/2016 1654   BASOSABS 0.1 12/21/2016 1654   Iron/TIBC/Ferritin/ %Sat No results  found for: IRON, TIBC, FERRITIN, IRONPCTSAT Lipid Panel     Component Value Date/Time   CHOL 200 (H) 12/24/2016 0954   TRIG 155 (H) 12/24/2016 0954   HDL 41 12/24/2016 0954   CHOLHDL 5 12/21/2016 1654   VLDL 48.0 (H) 12/21/2016 1654   LDLCALC 128 (H) 12/24/2016 0954   LDLDIRECT 146.0 12/21/2016 1654   Hepatic Function Panel     Component Value Date/Time   PROT 6.9  12/24/2016 0954   ALBUMIN 4.5 12/24/2016 0954   AST 23 12/24/2016 0954   ALT 29 12/24/2016 0954   ALKPHOS 79 12/24/2016 0954   BILITOT 0.3 12/24/2016 0954      Component Value Date/Time   TSH 4.25 12/21/2016 1654   TSH 2.86 07/16/2016 1526   TSH 5.10 (H) 05/11/2016 1108    ASSESSMENT AND PLAN: Prediabetes  Class 2 obesity with serious comorbidity and body mass index (BMI) of 37.0 to 37.9 in adult, unspecified obesity type  PLAN:  Pre-Diabetes Kelisha will continue to work on weight loss, exercise, and decreasing simple carbohydrates in her diet to help decrease the risk of diabetes. We dicussed metformin including benefits and risks. She was informed that eating too many simple carbohydrates or too many calories at one sitting increases the likelihood of GI side effects. Jerome agrees to continue metformin for now and a prescription was written today for 1 month refill. We will re-check labs in 1 month and Antigone agreed to follow up with [place] as directed to monitor her progress.  Obesity Lorynn is currently in the action stage of change. As such, her goal is to continue with weight loss efforts She has agreed to follow the Category 2 plan +breakfast options Callen has been instructed to work up to a goal of 150 minutes of combined cardio and strengthening exercise per week for weight loss and overall health benefits. We discussed the following Behavioral Modification Strategies today: increasing lean protein intake, decreasing simple carbohydrates  and work on meal planning and easy cooking plans  Anacristina has  agreed to follow up with our clinic in 2 weeks. She was informed of the importance of frequent follow up visits to maximize her success with intensive lifestyle modifications for her multiple health conditions.  I, Doreene Nest, am acting as transcriptionist for Dennard Nip, MD  I have reviewed the above documentation for accuracy and completeness, and I agree with the above. -Dennard Nip, MD    OBESITY BEHAVIORAL INTERVENTION VISIT  Today's visit was # 4 out of 22.  Starting weight: 226 lbs Starting date: 12/24/16 Today's weight : 211 lbs Today's date: 02/10/2017 Total lbs lost to date: 15 (Patients must lose 7 lbs in the first 6 months to continue with counseling)   ASK: We discussed the diagnosis of obesity with [place] today and Patrisha agreed to give [place] permission to discuss obesity behavioral modification therapy today.  ASSESS: Kirsten has the diagnosis of obesity and her BMI today is 37.39 Kameo is in the action stage of change   ADVISE: Verneal was educated on the multiple health risks of obesity as well as the benefit of weight loss to improve her health. She was advised of the need for long term treatment and the importance of lifestyle modifications.  AGREE: Multiple dietary modification options and treatment options were discussed and  Kiley agreed to follow the Category 2 plan +breakfast options We discussed the following Behavioral Modification Strategies today: increasing lean protein intake, decreasing simple carbohydrates  and work on meal planning and easy cooking plans

## 2017-02-24 ENCOUNTER — Ambulatory Visit (INDEPENDENT_AMBULATORY_CARE_PROVIDER_SITE_OTHER): Admitting: Physician Assistant

## 2017-02-24 VITALS — Ht 63.0 in | Wt 212.0 lb

## 2017-02-24 DIAGNOSIS — E669 Obesity, unspecified: Secondary | ICD-10-CM | POA: Diagnosis not present

## 2017-02-24 DIAGNOSIS — I10 Essential (primary) hypertension: Secondary | ICD-10-CM | POA: Diagnosis not present

## 2017-02-24 DIAGNOSIS — IMO0001 Reserved for inherently not codable concepts without codable children: Secondary | ICD-10-CM

## 2017-02-24 DIAGNOSIS — Z6837 Body mass index (BMI) 37.0-37.9, adult: Secondary | ICD-10-CM | POA: Diagnosis not present

## 2017-02-24 DIAGNOSIS — R7303 Prediabetes: Secondary | ICD-10-CM

## 2017-02-24 MED ORDER — METFORMIN HCL 500 MG PO TABS: 500.0000 mg | ORAL_TABLET | Freq: Two times a day (BID) | ORAL | 0 refills | Status: DC

## 2017-02-25 NOTE — Progress Notes (Signed)
Office: [phone]  /  Fax: [phone]   HPI:   Chief Complaint: OBESITY Destiny [name] is here to discuss her progress with her obesity treatment plan. She is on the Category 2 plan and is following her eating plan approximately 85 % of the time. She states she is exercising 0 minutes 0 times per week. Destiny [name] has increased hunger in the evening. She would like more breakfast options Her weight is 212 lb (96.2 kg) today and has had a weight gain of 1 pound over a period of 2 weeks since her last visit. She has lost 14 lbs since starting treatment with [place].  Pre-Diabetes Destiny [name] has a diagnosis of pre-diabetes based on her elevated Hgb A1c and was informed this puts her at greater risk of developing diabetes. She is taking metformin currently and continues to work on diet and exercise to decrease risk of diabetes. She admits polyphagia in the evenings and denies nausea or hypoglycemia.  Hypertension Destiny [name] is a 66 y.o. female with hypertension.  [name] denies chest pain or shortness of breath on exertion. She is working weight loss to help control her blood pressure with the goal of decreasing her risk of heart attack and stroke. Cecilias blood pressure is currently controlled.    ALLERGIES: Allergies  Allergen Reactions  . Diclofenac Itching, Nausea Only and Other (See Comments)    Wheezing  . Hydrochlorothiazide     Wheezing. Losing teeth.  . Statins     MEDICATIONS: Current Outpatient Prescriptions on File Prior to Visit  Medication Sig Dispense Refill  . ARMOUR THYROID 90 MG tablet Take 1 tablet daily. 90 tablet 3  . Calcium-Magnesium 500-250 MG TABS Take by mouth.    . Cholecalciferol (VITAMIN D PO) Take by mouth.    . COD LIVER OIL PO Take by mouth. Reported on 07/16/2015    . Cyanocobalamin (VITAMIN B-12) 5000 MCG TBDP Take 5,000 mcg by mouth daily.    . irbesartan (AVAPRO) 150 MG tablet Take 1 tablet (150 mg total) by mouth daily. 90 tablet 3  . metoprolol  succinate (TOPROL-XL) 25 MG 24 hr tablet Take 1 tablet (25 mg total) by mouth daily. 90 tablet 3  . Probiotic Product (PROBIOTIC-10) CAPS Take by mouth.    . ranitidine (ZANTAC) 150 MG capsule Take 150 mg by mouth daily as needed for heartburn.    . RED YEAST RICE EXTRACT PO Take by mouth. Reported on 07/16/2015    . Vitamin D, Ergocalciferol, (DRISDOL) 50000 units CAPS capsule Take 1 capsule (50,000 Units total) by mouth every 7 (seven) days. 4 capsule 0  . Wheat Dextrin (BENEFIBER DRINK MIX PO) Take by mouth.     No current facility-administered medications on file prior to visit.     PAST MEDICAL HISTORY: Past Medical History:  Diagnosis Date  . Abnormal blood chemistry   . Arthropathy   . Back pain   . Breast cyst   . Cellulitis   . Chest discomfort   . Constipation   . Disturbance of skin sensation   . Edema   . Encounter for screening and preventative care 06/17/2016  . GERD (gastroesophageal reflux disease)   . Hiatal hernia with gastroesophageal reflux 06/17/2016  . High risk medication use   . Hoarseness 06/15/2016  . Hyperglycemia   . Hyperglycemia 12/21/2016  . Hyperlipidemia   . Hyperlipidemia, mixed 06/15/2016  . Hypertension   . Hypopotassemia   . Hypothyroidism   . Joint pain   .  Menopause   . Obesity   . Palpitations   . Positive ANA (antinuclear antibody)   . Shoulder pain, right   . Sleep apnea   . SOB (shortness of breath)   . Thyroid disease 12/11/2014  . Thyroid nodule   . Vitamin D deficiency   . Vitamin D deficiency     PAST SURGICAL HISTORY: Past Surgical History:  Procedure Laterality Date  . BREAST CYST ASPIRATION Left    30 years ago   . LIPOSUCTION    . WISDOM TOOTH EXTRACTION     and all upper teeth    SOCIAL HISTORY: Social History  Substance Use Topics  . Smoking status: Former Smoker    Packs/day: 1.00    Years: 27.00    Types: Cigarettes  . Smokeless tobacco: Never Used  . Alcohol use 0.0 oz/week    FAMILY HISTORY: Family  History  Problem Relation Age of Onset  . Irregular heart beat Mother   . Heart disease Mother   . Rheumatic fever Mother   . Hypertension Father   . Ulcers Father   . Hyperlipidemia Father   . Vision loss Brother   . Stroke Maternal Grandmother   . Diabetes Maternal Grandmother   . Hypertension Paternal Grandmother   . Ulcers Paternal Grandfather     ROS: Review of Systems  Constitutional: Negative for weight loss.  Respiratory: Negative for shortness of breath (on exertion).   Cardiovascular: Negative for chest pain.  Gastrointestinal: Negative for nausea.  Endo/Heme/Allergies:       Positive polyphagia Negative hypoglycemia    PHYSICAL EXAM: Height 5\' 3"  (1.6 m), weight 212 lb (96.2 kg). Body mass index is 37.55 kg/m. Physical Exam  Constitutional: She is oriented to person, place, and time. She appears well-developed and well-nourished.  Cardiovascular: Normal rate.   Pulmonary/Chest: Effort normal.  Neurological: She is alert and oriented to person, place, and time.  Skin: Skin is warm and dry.    RECENT LABS AND TESTS: BMET    Component Value Date/Time   NA 138 12/24/2016 0954   K 4.5 12/24/2016 0954   CL 102 12/24/2016 0954   CO2 22 12/24/2016 0954   GLUCOSE 112 (H) 12/24/2016 0954   GLUCOSE 90 12/21/2016 1654   BUN 11 12/24/2016 0954   CREATININE 0.82 12/24/2016 0954   CALCIUM 9.6 12/24/2016 0954   GFRNONAA 76 12/24/2016 0954   GFRAA 87 12/24/2016 0954   Lab Results  Component Value Date   HGBA1C 5.7 12/21/2016   Lab Results  Component Value Date   INSULIN 42.5 (H) 12/24/2016   CBC    Component Value Date/Time   WBC 5.4 12/21/2016 1654   RBC 4.56 12/21/2016 1654   HGB 14.0 12/21/2016 1654   HCT 41.2 12/21/2016 1654   PLT 307.0 12/21/2016 1654   MCV 90.4 12/21/2016 1654   MCHC 33.9 12/21/2016 1654   RDW 13.1 12/21/2016 1654   LYMPHSABS 1.4 12/21/2016 1654   MONOABS 0.5 12/21/2016 1654   EOSABS 0.3 12/21/2016 1654   BASOSABS 0.1  12/21/2016 1654   Iron/TIBC/Ferritin/ %Sat No results found for: IRON, TIBC, FERRITIN, IRONPCTSAT Lipid Panel     Component Value Date/Time   CHOL 200 (H) 12/24/2016 0954   TRIG 155 (H) 12/24/2016 0954   HDL 41 12/24/2016 0954   CHOLHDL 5 12/21/2016 1654   VLDL 48.0 (H) 12/21/2016 1654   LDLCALC 128 (H) 12/24/2016 0954   LDLDIRECT 146.0 12/21/2016 1654   Hepatic Function Panel  Component Value Date/Time   PROT 6.9 12/24/2016 0954   ALBUMIN 4.5 12/24/2016 0954   AST 23 12/24/2016 0954   ALT 29 12/24/2016 0954   ALKPHOS 79 12/24/2016 0954   BILITOT 0.3 12/24/2016 0954      Component Value Date/Time   TSH 4.25 12/21/2016 1654   TSH 2.86 07/16/2016 1526   TSH 5.10 (H) 05/11/2016 1108    ASSESSMENT AND PLAN: Prediabetes - Plan: metFORMIN (GLUCOPHAGE) 500 MG tablet  Essential hypertension  Class 2 obesity with serious comorbidity and body mass index (BMI) of 37.0 to 37.9 in adult, unspecified obesity type  PLAN:  Pre-Diabetes Seda will continue to work on weight loss, exercise, and decreasing simple carbohydrates in her diet to help decrease the risk of diabetes. We dicussed metformin including benefits and risks. She was informed that eating too many simple carbohydrates or too many calories at one sitting increases the likelihood of GI side effects. Emberlynn agrees to increase metformin to 500 mg bid #60 with no refills and she will follow up with [place] as directed to monitor her progress.  Hypertension We discussed sodium restriction, working on healthy weight loss, and a regular exercise program as the means to achieve improved blood pressure control. Alaya agreed with this plan and agreed to follow up as directed. We will continue to monitor her blood pressure as well as her progress with the above lifestyle modifications. She will continue her medications as prescribed and will watch for signs of hypotension as she continues her lifestyle  modifications.  Obesity Donnamae is currently in the action stage of change. As such, her goal is to continue with weight loss efforts She has agreed to follow the Category 2 plan Jailani has been instructed to work up to a goal of 150 minutes of combined cardio and strengthening exercise per week for weight loss and overall health benefits. We discussed the following Behavioral Modification Strategies today: increasing lean protein intake and work on meal planning and easy cooking plans  Halynn has agreed to follow up with our clinic in 2 weeks. She was informed of the importance of frequent follow up visits to maximize her success with intensive lifestyle modifications for her multiple health conditions.  I, Doreene Nest, am acting as transcriptionist for Lacy Duverney, PA-C  I have reviewed the above documentation for accuracy and completeness, and I agree with the above. -Lacy Duverney, PA-C  I have reviewed the above note and agree with the plan. -Dennard Nip, MD   OBESITY BEHAVIORAL INTERVENTION VISIT  Today's visit was # 5 out of 22.  Starting weight: 226 lbs Starting date: 12/24/16 Today's weight : 212 lbs Today's date: 02/24/2017 Total lbs lost to date: 52 (Patients must lose 7 lbs in the first 6 months to continue with counseling)   ASK: We discussed the diagnosis of obesity with [place] today and Aeisha agreed to give [place] permission to discuss obesity behavioral modification therapy today.  ASSESS: Delyla has the diagnosis of obesity and her BMI today is 37.56 Akshitha is in the action stage of change   ADVISE: Monya was educated on the multiple health risks of obesity as well as the benefit of weight loss to improve her health. She was advised of the need for long term treatment and the importance of lifestyle modifications.  AGREE: Multiple dietary modification options and treatment options were discussed and  Allante agreed to follow the Category 2  plan We discussed the following Behavioral Modification Strategies today: increasing  lean protein intake and work on meal planning and easy cooking plans

## 2017-03-10 ENCOUNTER — Ambulatory Visit (INDEPENDENT_AMBULATORY_CARE_PROVIDER_SITE_OTHER): Admitting: Physician Assistant

## 2017-03-10 VITALS — HR 67 | Temp 97.9°F | Ht 63.0 in | Wt 213.0 lb

## 2017-03-10 DIAGNOSIS — E8881 Metabolic syndrome: Secondary | ICD-10-CM | POA: Diagnosis not present

## 2017-03-10 DIAGNOSIS — Z9189 Other specified personal risk factors, not elsewhere classified: Secondary | ICD-10-CM

## 2017-03-10 DIAGNOSIS — E559 Vitamin D deficiency, unspecified: Secondary | ICD-10-CM

## 2017-03-10 DIAGNOSIS — Z6837 Body mass index (BMI) 37.0-37.9, adult: Secondary | ICD-10-CM

## 2017-03-10 NOTE — Progress Notes (Addendum)
Office: [phone]  /  Fax: [phone]   HPI:   Chief Complaint: OBESITY Destiny [name] is here to discuss her progress with her obesity treatment plan. She is on the Category 2 plan and is following her eating plan approximately 70 % of the time. She states she is exercising 0 minutes 0 times per week. Destiny [name] was on vacation and had a harder time following the plan. She is back on track and is motivated about weight loss. Her weight is 213 lb (96.6 kg) today and has had a weight gain of 1 pound over a period of 2 weeks since her last visit. She has lost 13 lbs since starting treatment with [place].  Vitamin D deficiency Destiny [name] has a diagnosis of vitamin D deficiency. She is currently taking vit D and denies nausea, vomiting or muscle weakness.  At risk for osteopenia and osteoporosis Destiny [name] is at higher risk of osteopenia and osteoporosis due to vitamin D deficiency.   Pre-Diabetes Destiny [name] has a diagnosis of prediabetes based on her elevated HgA1c and was informed this puts her at greater risk of developing diabetes. She is taking metformin currently and continues to work on diet and exercise to decrease risk of diabetes. She denies nausea or hypoglycemia.     ALLERGIES: Allergies  Allergen Reactions  . Diclofenac Itching, Nausea Only and Other (See Comments)    Wheezing  . Hydrochlorothiazide     Wheezing. Losing teeth.  . Statins     MEDICATIONS: Current Outpatient Prescriptions on File Prior to Visit  Medication Sig Dispense Refill  . ARMOUR THYROID 90 MG tablet Take 1 tablet daily. 90 tablet 3  . Calcium-Magnesium 500-250 MG TABS Take by mouth.    . Cholecalciferol (VITAMIN D PO) Take by mouth.    . COD LIVER OIL PO Take by mouth. Reported on 07/16/2015    . Cyanocobalamin (VITAMIN B-12) 5000 MCG TBDP Take 5,000 mcg by mouth daily.    . irbesartan (AVAPRO) 150 MG tablet Take 1 tablet (150 mg total) by mouth daily. 90 tablet 3  . metoprolol succinate (TOPROL-XL) 25 MG 24 hr  tablet Take 1 tablet (25 mg total) by mouth daily. 90 tablet 3  . Probiotic Product (PROBIOTIC-10) CAPS Take by mouth.    . ranitidine (ZANTAC) 150 MG capsule Take 150 mg by mouth daily as needed for heartburn.    . RED YEAST RICE EXTRACT PO Take by mouth. Reported on 07/16/2015    . Vitamin D, Ergocalciferol, (DRISDOL) 50000 units CAPS capsule Take 1 capsule (50,000 Units total) by mouth every 7 (seven) days. 4 capsule 0  . Wheat Dextrin (BENEFIBER DRINK MIX PO) Take by mouth.     No current facility-administered medications on file prior to visit.     PAST MEDICAL HISTORY: Past Medical History:  Diagnosis Date  . Abnormal blood chemistry   . Arthropathy   . Back pain   . Breast cyst   . Cellulitis   . Chest discomfort   . Constipation   . Disturbance of skin sensation   . Edema   . Encounter for screening and preventative care 06/17/2016  . GERD (gastroesophageal reflux disease)   . Hiatal hernia with gastroesophageal reflux 06/17/2016  . High risk medication use   . Hoarseness 06/15/2016  . Hyperglycemia   . Hyperglycemia 12/21/2016  . Hyperlipidemia   . Hyperlipidemia, mixed 06/15/2016  . Hypertension   . Hypopotassemia   . Hypothyroidism   . Joint pain   . Menopause   .  Obesity   . Palpitations   . Positive ANA (antinuclear antibody)   . Shoulder pain, right   . Sleep apnea   . SOB (shortness of breath)   . Thyroid disease 12/11/2014  . Thyroid nodule   . Vitamin D deficiency   . Vitamin D deficiency     PAST SURGICAL HISTORY: Past Surgical History:  Procedure Laterality Date  . BREAST CYST ASPIRATION Left    30 years ago   . LIPOSUCTION    . WISDOM TOOTH EXTRACTION     and all upper teeth    SOCIAL HISTORY: Social History  Substance Use Topics  . Smoking status: Former Smoker    Packs/day: 1.00    Years: 27.00    Types: Cigarettes  . Smokeless tobacco: Never Used  . Alcohol use 0.0 oz/week    FAMILY HISTORY: Family History  Problem Relation Age of  Onset  . Irregular heart beat Mother   . Heart disease Mother   . Rheumatic fever Mother   . Hypertension Father   . Ulcers Father   . Hyperlipidemia Father   . Vision loss Brother   . Stroke Maternal Grandmother   . Diabetes Maternal Grandmother   . Hypertension Paternal Grandmother   . Ulcers Paternal Grandfather     ROS: Review of Systems  Constitutional: Negative for weight loss.  Gastrointestinal: Negative for nausea and vomiting.  Musculoskeletal:       Negative muscle weakness  Endo/Heme/Allergies:       Negative polyphagia    PHYSICAL EXAM: Pulse 67, temperature 97.9 F (36.6 C), temperature source Oral, height 5\' 3"  (1.6 m), weight 213 lb (96.6 kg), SpO2 99 %. Body mass index is 37.73 kg/m. Physical Exam  Constitutional: She is oriented to person, place, and time. She appears well-developed and well-nourished.  Cardiovascular: Normal rate.   Pulmonary/Chest: Effort normal.  Musculoskeletal: Normal range of motion.  Neurological: She is oriented to person, place, and time.  Skin: Skin is warm and dry.  Psychiatric: She has a normal mood and affect. Her behavior is normal.  Vitals reviewed.   RECENT LABS AND TESTS: BMET    Component Value Date/Time   NA 138 12/24/2016 0954   K 4.5 12/24/2016 0954   CL 102 12/24/2016 0954   CO2 22 12/24/2016 0954   GLUCOSE 112 (H) 12/24/2016 0954   GLUCOSE 90 12/21/2016 1654   BUN 11 12/24/2016 0954   CREATININE 0.82 12/24/2016 0954   CALCIUM 9.6 12/24/2016 0954   GFRNONAA 76 12/24/2016 0954   GFRAA 87 12/24/2016 0954   Lab Results  Component Value Date   HGBA1C 5.7 12/21/2016   Lab Results  Component Value Date   INSULIN 42.5 (H) 12/24/2016   CBC    Component Value Date/Time   WBC 5.4 12/21/2016 1654   RBC 4.56 12/21/2016 1654   HGB 14.0 12/21/2016 1654   HCT 41.2 12/21/2016 1654   PLT 307.0 12/21/2016 1654   MCV 90.4 12/21/2016 1654   MCHC 33.9 12/21/2016 1654   RDW 13.1 12/21/2016 1654   LYMPHSABS  1.4 12/21/2016 1654   MONOABS 0.5 12/21/2016 1654   EOSABS 0.3 12/21/2016 1654   BASOSABS 0.1 12/21/2016 1654   Iron/TIBC/Ferritin/ %Sat No results found for: IRON, TIBC, FERRITIN, IRONPCTSAT Lipid Panel     Component Value Date/Time   CHOL 200 (H) 12/24/2016 0954   TRIG 155 (H) 12/24/2016 0954   HDL 41 12/24/2016 0954   CHOLHDL 5 12/21/2016 1654   VLDL 48.0 (H)  12/21/2016 1654   LDLCALC 128 (H) 12/24/2016 0954   LDLDIRECT 146.0 12/21/2016 1654   Hepatic Function Panel     Component Value Date/Time   PROT 6.9 12/24/2016 0954   ALBUMIN 4.5 12/24/2016 0954   AST 23 12/24/2016 0954   ALT 29 12/24/2016 0954   ALKPHOS 79 12/24/2016 0954   BILITOT 0.3 12/24/2016 0954      Component Value Date/Time   TSH 4.25 12/21/2016 1654   TSH 2.86 07/16/2016 1526   TSH 5.10 (H) 05/11/2016 1108    ASSESSMENT AND PLAN: Vitamin D deficiency - Plan: Vitamin D, Ergocalciferol, (DRISDOL) 50000 units CAPS capsule  Insulin resistance  At risk for osteoporosis  Class 2 severe obesity with serious comorbidity and body mass index (BMI) of 37.0 to 37.9 in adult, unspecified obesity type ([place])  PLAN:  Vitamin D Deficiency Issa was informed that low vitamin D levels contributes to fatigue and are associated with obesity, breast, and colon cancer. She agrees to continue to take prescription Vit D @50 ,000 IU every week, we will refill for 1 month and will follow up for routine testing of vitamin D, at least 2-3 times per year. She was informed of the risk of over-replacement of vitamin D and agrees to not increase her dose unless he discusses this with [place] first. Nathaly agrees to follow up with our clinic in 3 weeks.  At risk for osteopenia and osteoporosis Lee-Anne is at risk for osteopenia and osteoporosis due to her vitamin D deficiency. She was encouraged to take her vitamin D and follow her higher calcium diet and increase strengthening exercise to help strengthen her bones and decrease her  risk of osteopenia and osteoporosis.  Pre-Diabetes Marquise will continue to work on weight loss, exercise, and decreasing simple carbohydrates in her diet to help decrease the risk of diabetes. We dicussed metformin including benefits and risks. She was informed that eating too many simple carbohydrates or too many calories at one sitting increases the likelihood of GI side effects. Zahari agrees to stop Metformin for now, and prescription  was not written today. Karletta agreed to follow up with [place] as directed to monitor her progress.    Obesity Delane is currently in the action stage of change. As such, her goal is to continue with weight loss efforts She has agreed to follow the Category 2 plan Samra has been instructed to work up to a goal of 150 minutes of combined cardio and strengthening exercise per week for weight loss and overall health benefits. We discussed the following Behavioral Modification Strategies today: increasing lean protein intake and work on meal planning and easy cooking plans  Marisabel has agreed to follow up with our clinic in 3 weeks. She was informed of the importance of frequent follow up visits to maximize her success with intensive lifestyle modifications for her multiple health conditions.  I, Doreene Nest, am acting as transcriptionist for Lacy Duverney, PA-C  I have reviewed the above documentation for accuracy and completeness, and I agree with the above. -Lacy Duverney, PA-C  I have reviewed the above note and agree with the plan. -Dennard Nip, MD  OBESITY BEHAVIORAL INTERVENTION VISIT  Today's visit was # 6 out of 22.  Starting weight: 226 lbs Starting date: 12/24/16 Today's weight : 213 lbs Today's date: 03/10/2017 Total lbs lost to date: 13 (Patients must lose 7 lbs in the first 6 months to continue with counseling)   ASK: We discussed the diagnosis of obesity with Lorna Few A  [place]one today and Anedra agreed to give [place] permission to discuss  obesity behavioral modification therapy today.  ASSESS: Rachel has the diagnosis of obesity and her BMI today is 37.74 Jacquelyne is in the action stage of change   ADVISE: Sydney was educated on the multiple health risks of obesity as well as the benefit of weight loss to improve her health. She was advised of the need for long term treatment and the importance of lifestyle modifications.  AGREE: Multiple dietary modification options and treatment options were discussed and  Osiris agreed to follow the Category 2 plan We discussed the following Behavioral Modification Strategies today: increasing lean protein intake and work on meal planning and easy cooking plans   Addendum Created on 03/29/17- Diagnosis of Insulin Resistance changed to Prediabetes by Lacy Duverney PAC

## 2017-03-11 ENCOUNTER — Other Ambulatory Visit (INDEPENDENT_AMBULATORY_CARE_PROVIDER_SITE_OTHER): Payer: Self-pay | Admitting: Family Medicine

## 2017-03-11 DIAGNOSIS — E559 Vitamin D deficiency, unspecified: Secondary | ICD-10-CM

## 2017-03-11 MED ORDER — VITAMIN D (ERGOCALCIFEROL) 1.25 MG (50000 UNIT) PO CAPS: 50000.0000 [IU] | ORAL_CAPSULE | ORAL | 0 refills | Status: DC

## 2017-03-29 ENCOUNTER — Ambulatory Visit (INDEPENDENT_AMBULATORY_CARE_PROVIDER_SITE_OTHER): Admitting: Family Medicine

## 2017-03-29 VITALS — BP 126/82 | HR 66 | Temp 98.3°F | Ht 63.0 in | Wt 211.0 lb

## 2017-03-29 DIAGNOSIS — R7303 Prediabetes: Secondary | ICD-10-CM | POA: Diagnosis not present

## 2017-03-29 DIAGNOSIS — E559 Vitamin D deficiency, unspecified: Secondary | ICD-10-CM

## 2017-03-29 DIAGNOSIS — Z6837 Body mass index (BMI) 37.0-37.9, adult: Secondary | ICD-10-CM | POA: Diagnosis not present

## 2017-03-29 DIAGNOSIS — E7849 Other hyperlipidemia: Secondary | ICD-10-CM

## 2017-03-29 NOTE — Progress Notes (Signed)
Office: [phone]  /  Fax: [phone]   HPI:   Chief Complaint: OBESITY Destiny [name] is here to discuss her progress with her obesity treatment plan. She is on the Category 2 plan and is following her eating plan approximately 80 % of the time. She states she is exercising 0 minutes 0 times per week. Zakyah continues to do well with weight loss. She is eating an extra snack in the pm due to cravings. Her weight is 211 lb (95.7 kg) today and has had a weight loss of 2 pounds over a period of 3 weeks since her last visit. She has lost 15 lbs since starting treatment with [place].  Vitamin D deficiency Destiny [name] has a diagnosis of vitamin D deficiency. She is currently taking vit D prescription and still notes fatigue, which may be slightly improved. Maciah  denies nausea, vomiting or muscle weakness.  Pre-Diabetes Destiny [name] has a diagnosis of pre-diabetes based on her elevated Hgb A1c and was informed this puts her at greater risk of developing diabetes. She is on metformin 1 pill at lunch. She tried to increase to 2 times a day but didn't "feel well", so she decreased back to once daily. She is doing well on her diet prescription. Destiny [name] continues to work on diet and exercise to decrease risk of diabetes. She denies nausea or hypoglycemia.  Hyperlipidemia Destiny [name] has hyperlipidemia and has been attempting to control her cholesterol levels with intensive lifestyle modification including a low saturated fat diet, exercise and weight loss. She denies any chest pain, claudication or myalgias. Destiny [name] is due for labs.   ALLERGIES: Allergies  Allergen Reactions  . Diclofenac Itching, Nausea Only and Other (See Comments)    Wheezing  . Hydrochlorothiazide     Wheezing. Losing teeth.  . Statins     MEDICATIONS: Current Outpatient Prescriptions on File Prior to Visit  Medication Sig Dispense Refill  . ARMOUR THYROID 90 MG tablet Take 1 tablet daily. 90 tablet 3  . Calcium-Magnesium 500-250 MG  TABS Take by mouth.    . COD LIVER OIL PO Take by mouth. Reported on 07/16/2015    . Cyanocobalamin (VITAMIN B-12) 5000 MCG TBDP Take 5,000 mcg by mouth daily.    . irbesartan (AVAPRO) 150 MG tablet Take 1 tablet (150 mg total) by mouth daily. 90 tablet 3  . metoprolol succinate (TOPROL-XL) 25 MG 24 hr tablet Take 1 tablet (25 mg total) by mouth daily. 90 tablet 3  . Probiotic Product (PROBIOTIC-10) CAPS Take by mouth.    . ranitidine (ZANTAC) 150 MG capsule Take 150 mg by mouth daily as needed for heartburn.    . RED YEAST RICE EXTRACT PO Take by mouth. Reported on 07/16/2015    . Vitamin D, Ergocalciferol, (DRISDOL) 50000 units CAPS capsule Take 1 capsule (50,000 Units total) by mouth every 7 (seven) days. 4 capsule 0  . Wheat Dextrin (BENEFIBER DRINK MIX PO) Take by mouth.     No current facility-administered medications on file prior to visit.     PAST MEDICAL HISTORY: Past Medical History:  Diagnosis Date  . Abnormal blood chemistry   . Arthropathy   . Back pain   . Breast cyst   . Cellulitis   . Chest discomfort   . Constipation   . Disturbance of skin sensation   . Edema   . Encounter for screening and preventative care 06/17/2016  . GERD (gastroesophageal reflux disease)   . Hiatal hernia with gastroesophageal reflux 06/17/2016  . High risk  medication use   . Hoarseness 06/15/2016  . Hyperglycemia   . Hyperglycemia 12/21/2016  . Hyperlipidemia   . Hyperlipidemia, mixed 06/15/2016  . Hypertension   . Hypopotassemia   . Hypothyroidism   . Joint pain   . Menopause   . Obesity   . Palpitations   . Positive ANA (antinuclear antibody)   . Shoulder pain, right   . Sleep apnea   . SOB (shortness of breath)   . Thyroid disease 12/11/2014  . Thyroid nodule   . Vitamin D deficiency   . Vitamin D deficiency     PAST SURGICAL HISTORY: Past Surgical History:  Procedure Laterality Date  . BREAST CYST ASPIRATION Left    30 years ago   . LIPOSUCTION    . WISDOM TOOTH EXTRACTION       and all upper teeth    SOCIAL HISTORY: Social History  Substance Use Topics  . Smoking status: Former Smoker    Packs/day: 1.00    Years: 27.00    Types: Cigarettes  . Smokeless tobacco: Never Used  . Alcohol use 0.0 oz/week    FAMILY HISTORY: Family History  Problem Relation Age of Onset  . Irregular heart beat Mother   . Heart disease Mother   . Rheumatic fever Mother   . Hypertension Father   . Ulcers Father   . Hyperlipidemia Father   . Vision loss Brother   . Stroke Maternal Grandmother   . Diabetes Maternal Grandmother   . Hypertension Paternal Grandmother   . Ulcers Paternal Grandfather     ROS: Review of Systems  Constitutional: Positive for malaise/fatigue and weight loss.  Cardiovascular: Negative for chest pain and claudication.  Gastrointestinal: Negative for nausea and vomiting.  Musculoskeletal: Negative for myalgias.       Negative muscle weakness    PHYSICAL EXAM: Blood pressure 126/82, pulse 66, temperature 98.3 F (36.8 C), temperature source Oral, height 5\' 3"  (1.6 m), weight 211 lb (95.7 kg), SpO2 97 %. Body mass index is 37.38 kg/m. Physical Exam  Constitutional: She is oriented to person, place, and time. She appears well-developed and well-nourished.  Cardiovascular: Normal rate.   Pulmonary/Chest: Effort normal.  Musculoskeletal: Normal range of motion.  Neurological: She is oriented to person, place, and time.  Skin: Skin is warm and dry.  Psychiatric: She has a normal mood and affect. Her behavior is normal.  Vitals reviewed.   RECENT LABS AND TESTS: BMET    Component Value Date/Time   NA 138 12/24/2016 0954   K 4.5 12/24/2016 0954   CL 102 12/24/2016 0954   CO2 22 12/24/2016 0954   GLUCOSE 112 (H) 12/24/2016 0954   GLUCOSE 90 12/21/2016 1654   BUN 11 12/24/2016 0954   CREATININE 0.82 12/24/2016 0954   CALCIUM 9.6 12/24/2016 0954   GFRNONAA 76 12/24/2016 0954   GFRAA 87 12/24/2016 0954   Lab Results  Component Value  Date   HGBA1C 5.7 12/21/2016   Lab Results  Component Value Date   INSULIN 42.5 (H) 12/24/2016   CBC    Component Value Date/Time   WBC 5.4 12/21/2016 1654   RBC 4.56 12/21/2016 1654   HGB 14.0 12/21/2016 1654   HCT 41.2 12/21/2016 1654   PLT 307.0 12/21/2016 1654   MCV 90.4 12/21/2016 1654   MCHC 33.9 12/21/2016 1654   RDW 13.1 12/21/2016 1654   LYMPHSABS 1.4 12/21/2016 1654   MONOABS 0.5 12/21/2016 1654   EOSABS 0.3 12/21/2016 1654   BASOSABS 0.1  12/21/2016 1654   Iron/TIBC/Ferritin/ %Sat No results found for: IRON, TIBC, FERRITIN, IRONPCTSAT Lipid Panel     Component Value Date/Time   CHOL 200 (H) 12/24/2016 0954   TRIG 155 (H) 12/24/2016 0954   HDL 41 12/24/2016 0954   CHOLHDL 5 12/21/2016 1654   VLDL 48.0 (H) 12/21/2016 1654   LDLCALC 128 (H) 12/24/2016 0954   LDLDIRECT 146.0 12/21/2016 1654   Hepatic Function Panel     Component Value Date/Time   PROT 6.9 12/24/2016 0954   ALBUMIN 4.5 12/24/2016 0954   AST 23 12/24/2016 0954   ALT 29 12/24/2016 0954   ALKPHOS 79 12/24/2016 0954   BILITOT 0.3 12/24/2016 0954      Component Value Date/Time   TSH 4.25 12/21/2016 1654   TSH 2.86 07/16/2016 1526   TSH 5.10 (H) 05/11/2016 1108    ASSESSMENT AND PLAN: Other hyperlipidemia - Plan: Lipid Panel With LDL/HDL Ratio  Vitamin D deficiency - Plan: VITAMIN D 25 Hydroxy (Vit-D Deficiency, Fractures), Vitamin B12  Prediabetes - Plan: Comprehensive metabolic panel, Hemoglobin A1c, Insulin, random, Vitamin B12  Class 2 severe obesity with serious comorbidity and body mass index (BMI) of 37.0 to 37.9 in adult, unspecified obesity type (Destiny [name])  PLAN:  Vitamin D Deficiency Destiny [name] was informed that low vitamin D levels contributes to fatigue and are associated with obesity, breast, and colon cancer. She agrees to continue to take prescription Vit D @50 ,000 IU every week. We will check labs and will follow up for routine testing of vitamin D, at least 2-3 times per year.  She was informed of the risk of over-replacement of vitamin D and agrees to not increase her dose unless he discusses this with [place] first.  Pre-Diabetes Destiny [name] will continue to work on weight loss, exercise, and decreasing simple carbohydrates in her diet to help decrease the risk of diabetes. We dicussed metformin including benefits and risks. She was informed that eating too many simple carbohydrates or too many calories at one sitting increases the likelihood of GI side effects. We will check labs and Destiny [name] agrees to continue metformin for now and a prescription was not written today. Destiny [name] agreed to follow up with [place] as directed to monitor her progress.  Hyperlipidemia Destiny [name] was informed of the American Heart Association Guidelines emphasizing intensive lifestyle modifications as the first line treatment for hyperlipidemia. We discussed many lifestyle modifications today in depth, and Destiny [name] will continue to work on decreasing saturated fats such as fatty red meat, butter and many fried foods. She will also increase vegetables and lean protein in her diet and continue to work on exercise and weight loss efforts.We will check labs and Destiny [name] agrees to follow up with our clinic in 2 weeks.  Obesity Destiny [name] is currently in the action stage of change. As such, her goal is to continue with weight loss efforts She has agreed to follow the Category 2 plan Destiny [name] has been instructed to work up to a goal of 150 minutes of combined cardio and strengthening exercise per week for weight loss and overall health benefits. We discussed the following Behavioral Modification Strategies today: better snacking choices, increasing lean protein intake and work on meal planning and easy cooking plans  Destiny [name] has agreed to follow up with our clinic in 2 weeks. She was informed of the importance of frequent follow up visits to maximize her success with intensive lifestyle modifications for her multiple health  conditions.  Destiny [name] Nest, am acting as Location manager for [employer],  MD  I have reviewed the above documentation for accuracy and completeness, and I agree with the above. -Dennard Nip, MD    OBESITY BEHAVIORAL INTERVENTION VISIT  Today's visit was # 7 out of 22.  Starting weight: 226 lbs Starting date: 12/24/16 Today's weight : 211 lbs Today's date: 03/29/2017 Total lbs lost to date: 15 (Patients must lose 7 lbs in the first 6 months to continue with counseling)   ASK: We discussed the diagnosis of obesity with [place] today and Jera agreed to give [place] permission to discuss obesity behavioral modification therapy today.  ASSESS: Teka has the diagnosis of obesity and her BMI today is 37.39 Aunika is in the action stage of change   ADVISE: Mouna was educated on the multiple health risks of obesity as well as the benefit of weight loss to improve her health. She was advised of the need for long term treatment and the importance of lifestyle modifications.  AGREE: Multiple dietary modification options and treatment options were discussed and  [place] agreed to follow the Category 2 plan We discussed the following Behavioral Modification Strategies today: better snacking choices, increasing lean protein intake and work on meal planning and easy cooking plans

## 2017-03-30 LAB — LIPID PANEL WITH LDL/HDL RATIO
Cholesterol, Total: 254 mg/dL — ABNORMAL HIGH (ref 100–199)
HDL: 41 mg/dL (ref >39)
LDL CALC: 162 mg/dL — AB (ref 0–99)
LDl/HDL Ratio: 4 ratio — ABNORMAL HIGH (ref 0.0–3.2)
Triglycerides: 256 mg/dL — ABNORMAL HIGH (ref 0–149)
VLDL CHOLESTEROL CAL: 51 mg/dL — AB (ref 5–40)

## 2017-03-30 LAB — COMPREHENSIVE METABOLIC PANEL
A/G RATIO: 1.6 (ref 1.2–2.2)
ALT: 22 IU/L (ref 0–32)
AST: 16 IU/L (ref 0–40)
Albumin: 4.4 g/dL (ref 3.6–4.8)
Alkaline Phosphatase: 79 IU/L (ref 39–117)
BUN/Creatinine Ratio: 15 (ref 12–28)
BUN: 12 mg/dL (ref 8–27)
Bilirubin Total: 0.4 mg/dL (ref 0.0–1.2)
CHLORIDE: 99 mmol/L (ref 96–106)
CO2: 23 mmol/L (ref 20–29)
Calcium: 9.9 mg/dL (ref 8.7–10.3)
Creatinine, Ser: 0.81 mg/dL (ref 0.57–1.00)
GFR calc Af Amer: 89 mL/min/{1.73_m2} (ref >59)
GFR calc non Af Amer: 77 mL/min/{1.73_m2} (ref >59)
GLOBULIN, TOTAL: 2.7 g/dL (ref 1.5–4.5)
Glucose: 97 mg/dL (ref 65–99)
POTASSIUM: 4.7 mmol/L (ref 3.5–5.2)
SODIUM: 140 mmol/L (ref 134–144)
Total Protein: 7.1 g/dL (ref 6.0–8.5)

## 2017-03-30 LAB — VITAMIN B12: Vitamin B-12: 1151 pg/mL (ref 232–1245)

## 2017-03-30 LAB — HEMOGLOBIN A1C
Est. average glucose Bld gHb Est-mCnc: 114 mg/dL
Hgb A1c MFr Bld: 5.6 % (ref 4.8–5.6)

## 2017-03-30 LAB — VITAMIN D 25 HYDROXY (VIT D DEFICIENCY, FRACTURES): VIT D 25 HYDROXY: 28.7 ng/mL — AB (ref 30.0–100.0)

## 2017-03-30 LAB — INSULIN, RANDOM: INSULIN: 34.5 u[IU]/mL — AB (ref 2.6–24.9)

## 2017-04-01 ENCOUNTER — Encounter: Payer: Self-pay | Admitting: Family Medicine

## 2017-04-01 ENCOUNTER — Ambulatory Visit (INDEPENDENT_AMBULATORY_CARE_PROVIDER_SITE_OTHER): Admitting: Family Medicine

## 2017-04-01 DIAGNOSIS — R7303 Prediabetes: Secondary | ICD-10-CM | POA: Diagnosis not present

## 2017-04-01 DIAGNOSIS — E782 Mixed hyperlipidemia: Secondary | ICD-10-CM | POA: Diagnosis not present

## 2017-04-01 DIAGNOSIS — R32 Unspecified urinary incontinence: Secondary | ICD-10-CM

## 2017-04-01 DIAGNOSIS — E038 Other specified hypothyroidism: Secondary | ICD-10-CM

## 2017-04-01 DIAGNOSIS — Z23 Encounter for immunization: Secondary | ICD-10-CM | POA: Diagnosis not present

## 2017-04-01 DIAGNOSIS — I10 Essential (primary) hypertension: Secondary | ICD-10-CM

## 2017-04-01 DIAGNOSIS — N3946 Mixed incontinence: Secondary | ICD-10-CM | POA: Insufficient documentation

## 2017-04-01 DIAGNOSIS — E559 Vitamin D deficiency, unspecified: Secondary | ICD-10-CM

## 2017-04-01 HISTORY — DX: Unspecified urinary incontinence: R32

## 2017-04-01 MED ORDER — ARMOUR THYROID 90 MG PO TABS: ORAL_TABLET | ORAL | 3 refills | Status: DC

## 2017-04-01 MED ORDER — IRBESARTAN 150 MG PO TABS: 150.0000 mg | ORAL_TABLET | Freq: Every day | ORAL | 3 refills | Status: DC

## 2017-04-01 MED ORDER — METOPROLOL SUCCINATE ER 25 MG PO TB24: 25.0000 mg | ORAL_TABLET | Freq: Every day | ORAL | 3 refills | Status: DC

## 2017-04-01 NOTE — Assessment & Plan Note (Signed)
Worsening and looses complete control. Will refer to urology for further evaluation

## 2017-04-01 NOTE — Patient Instructions (Signed)

## 2017-04-01 NOTE — Assessment & Plan Note (Signed)
Keep taking the Vitamin D weekly dose

## 2017-04-01 NOTE — Assessment & Plan Note (Signed)
Encouraged heart healthy diet, increase exercise, avoid trans fats, consider a krill oil cap daily 

## 2017-04-01 NOTE — Assessment & Plan Note (Signed)
Doing well on Armour thyroid

## 2017-04-01 NOTE — Progress Notes (Signed)
Subjective:  I acted as a Education administrator for Dr. [name]. [place], [place]  Patient ID: Destiny [name], female    DOB: 29-Mar-1951, 66 y.o.   MRN: [id number]  No chief complaint on file.   HPI  Patient is in today for a 3 month follow up and over all she is doing well. She notes her biggest concern is that her urinary incontinence is worsening. She notes urgency and frequency. She has complete incontinence at times. No dysuria or hematuria. No fevers or abdominal pain. Denies CP/palp/SOB/HA/congestion/fevers/GI c/o. Taking meds as prescribed  Patient Care Team: Mosie Lukes, MD as PCP - General (Family Medicine) Philemon Kingdom, MD as Consulting Physician (Internal Medicine)   Past Medical History:  Diagnosis Date  . Abnormal blood chemistry   . Arthropathy   . Back pain   . Breast cyst   . Cellulitis   . Chest discomfort   . Constipation   . Disturbance of skin sensation   . Edema   . Encounter for screening and preventative care 06/17/2016  . GERD (gastroesophageal reflux disease)   . Hiatal hernia with gastroesophageal reflux 06/17/2016  . High risk medication use   . Hoarseness 06/15/2016  . Hyperglycemia   . Hyperglycemia 12/21/2016  . Hyperlipidemia   . Hyperlipidemia, mixed 06/15/2016  . Hypertension   . Hypopotassemia   . Hypothyroidism   . Joint pain   . Menopause   . Obesity   . Palpitations   . Positive ANA (antinuclear antibody)   . Shoulder pain, right   . Sleep apnea   . SOB (shortness of breath)   . Thyroid disease 12/11/2014  . Thyroid nodule   . Urinary incontinence 04/01/2017  . Vitamin D deficiency   . Vitamin D deficiency     Past Surgical History:  Procedure Laterality Date  . BREAST CYST ASPIRATION Left    30 years ago   . LIPOSUCTION    . WISDOM TOOTH EXTRACTION     and all upper teeth    Family History  Problem Relation Age of Onset  . Irregular heart beat Mother   . Heart disease Mother   . Rheumatic fever Mother   . Hypertension Father   .  Ulcers Father   . Hyperlipidemia Father   . Vision loss Brother   . Stroke Maternal Grandmother   . Diabetes Maternal Grandmother   . Hypertension Paternal Grandmother   . Ulcers Paternal Grandfather     Social History   Social History  . Marital status: Married    Spouse name: Tessla Spurling  . Number of children: N/A  . Years of education: N/A   Occupational History  . homemaker    Social History Main Topics  . Smoking status: Former Smoker    Packs/day: 1.00    Years: 27.00    Types: Cigarettes  . Smokeless tobacco: Never Used  . Alcohol use 0.0 oz/week  . Drug use: No  . Sexual activity: Not on file   Other Topics Concern  . Not on file   Social History Narrative   Occasional alcohol use: hard liquor, wine and beer    Outpatient Medications Prior to Visit  Medication Sig Dispense Refill  . Calcium-Magnesium 500-250 MG TABS Take by mouth.    . COD LIVER OIL PO Take by mouth. Reported on 07/16/2015    . Cyanocobalamin (VITAMIN B-12) 5000 MCG TBDP Take 5,000 mcg by mouth daily.    . metFORMIN (GLUCOPHAGE) 500 MG tablet Take  500 mg by mouth daily with breakfast.    . Probiotic Product (PROBIOTIC-10) CAPS Take by mouth.    . ranitidine (ZANTAC) 150 MG capsule Take 150 mg by mouth daily as needed for heartburn.    . RED YEAST RICE EXTRACT PO Take by mouth. Reported on 07/16/2015    . Vitamin D, Ergocalciferol, (DRISDOL) 50000 units CAPS capsule Take 1 capsule (50,000 Units total) by mouth every 7 (seven) days. 4 capsule 0  . Wheat Dextrin (BENEFIBER DRINK MIX PO) Take by mouth.    Francia Greaves THYROID 90 MG tablet Take 1 tablet daily. 90 tablet 3  . irbesartan (AVAPRO) 150 MG tablet Take 1 tablet (150 mg total) by mouth daily. 90 tablet 3  . metoprolol succinate (TOPROL-XL) 25 MG 24 hr tablet Take 1 tablet (25 mg total) by mouth daily. 90 tablet 3   No facility-administered medications prior to visit.     Allergies  Allergen Reactions  . Diclofenac Itching, Nausea Only and  Other (See Comments)    Wheezing  . Hydrochlorothiazide     Wheezing. Losing teeth.  . Statins     Review of Systems  Constitutional: Negative for fever and malaise/fatigue.  HENT: Negative for congestion.   Eyes: Negative for blurred vision.  Respiratory: Negative for shortness of breath.   Cardiovascular: Negative for chest pain, palpitations and leg swelling.  Gastrointestinal: Negative for abdominal pain, blood in stool and nausea.  Genitourinary: Positive for frequency and urgency. Negative for dysuria, flank pain and hematuria.  Musculoskeletal: Negative for falls.  Skin: Negative for rash.  Neurological: Negative for dizziness, loss of consciousness and headaches.  Endo/Heme/Allergies: Negative for environmental allergies.  Psychiatric/Behavioral: Negative for depression. The patient is not nervous/anxious.        Objective:    Physical Exam  Constitutional: She is oriented to person, place, and time. She appears well-developed and well-nourished. No distress.  HENT:  Head: Normocephalic and atraumatic.  Nose: Nose normal.  Eyes: Right eye exhibits no discharge. Left eye exhibits no discharge.  Neck: Normal range of motion. Neck supple.  Cardiovascular: Normal rate and regular rhythm.   No murmur heard. Pulmonary/Chest: Effort normal and breath sounds normal.  Abdominal: Soft. Bowel sounds are normal. There is no tenderness.  Musculoskeletal: She exhibits no edema.  Neurological: She is alert and oriented to person, place, and time.  Skin: Skin is warm and dry.  Psychiatric: She has a normal mood and affect.  Nursing note and vitals reviewed.   There were no vitals taken for this visit. Wt Readings from Last 3 Encounters:  03/29/17 211 lb (95.7 kg)  03/10/17 213 lb (96.6 kg)  02/24/17 212 lb (96.2 kg)   BP Readings from Last 3 Encounters:  03/29/17 126/82  02/10/17 113/76  01/25/17 134/80     Immunization History  Administered Date(s) Administered  .  Tdap 04/01/2017    Health Maintenance  Topic Date Due  . HIV Screening  09/22/1967  . INFLUENZA VACCINE  01/06/2017  . PAP SMEAR  05/14/2017  . MAMMOGRAM  07/23/2018  . COLONOSCOPY  08/15/2024  . TETANUS/TDAP  04/02/2027  . Hepatitis C Screening  Completed    Lab Results  Component Value Date   WBC 5.4 12/21/2016   HGB 14.0 12/21/2016   HCT 41.2 12/21/2016   PLT 307.0 12/21/2016   GLUCOSE 97 03/29/2017   CHOL 254 (H) 03/29/2017   TRIG 256 (H) 03/29/2017   HDL 41 03/29/2017   LDLDIRECT 146.0 12/21/2016  LDLCALC 162 (H) 03/29/2017   ALT 22 03/29/2017   AST 16 03/29/2017   NA 140 03/29/2017   K 4.7 03/29/2017   CL 99 03/29/2017   CREATININE 0.81 03/29/2017   BUN 12 03/29/2017   CO2 23 03/29/2017   TSH 4.25 12/21/2016   HGBA1C 5.6 03/29/2017    Lab Results  Component Value Date   TSH 4.25 12/21/2016   Lab Results  Component Value Date   WBC 5.4 12/21/2016   HGB 14.0 12/21/2016   HCT 41.2 12/21/2016   MCV 90.4 12/21/2016   PLT 307.0 12/21/2016   Lab Results  Component Value Date   NA 140 03/29/2017   K 4.7 03/29/2017   CO2 23 03/29/2017   GLUCOSE 97 03/29/2017   BUN 12 03/29/2017   CREATININE 0.81 03/29/2017   BILITOT 0.4 03/29/2017   ALKPHOS 79 03/29/2017   AST 16 03/29/2017   ALT 22 03/29/2017   PROT 7.1 03/29/2017   ALBUMIN 4.4 03/29/2017   CALCIUM 9.9 03/29/2017   GFR 71.51 12/21/2016   Lab Results  Component Value Date   CHOL 254 (H) 03/29/2017   Lab Results  Component Value Date   HDL 41 03/29/2017   Lab Results  Component Value Date   LDLCALC 162 (H) 03/29/2017   Lab Results  Component Value Date   TRIG 256 (H) 03/29/2017   Lab Results  Component Value Date   CHOLHDL 5 12/21/2016   Lab Results  Component Value Date   HGBA1C 5.6 03/29/2017         Assessment & Plan:   Problem List Items Addressed This Visit    Hypothyroidism (Chronic)    Doing well on Armour thyroid      Relevant Medications   metoprolol succinate  (TOPROL-XL) 25 MG 24 hr tablet   ARMOUR THYROID 90 MG tablet   Essential hypertension    Well controlled, no changes to meds. Encouraged heart healthy diet such as the DASH diet and exercise as tolerated.       Relevant Medications   irbesartan (AVAPRO) 150 MG tablet   metoprolol succinate (TOPROL-XL) 25 MG 24 hr tablet   Mixed hyperlipidemia    Encouraged heart healthy diet, increase exercise, avoid trans fats, consider a krill oil cap daily      Relevant Medications   irbesartan (AVAPRO) 150 MG tablet   metoprolol succinate (TOPROL-XL) 25 MG 24 hr tablet   Vitamin D deficiency    Keep taking the Vitamin D weekly dose      Prediabetes    Is tolerating Metformin, side effects are improving      Urinary incontinence    Worsening and looses complete control. Will refer to urology for further evaluation      Relevant Orders   Ambulatory referral to Urology   Urinalysis (Completed)   Urine Culture (Completed)      I am having Ms. Montoro maintain her COD LIVER OIL PO, RED YEAST RICE EXTRACT PO, Vitamin B-12, ranitidine, Calcium-Magnesium, PROBIOTIC-10, Wheat Dextrin (BENEFIBER DRINK MIX PO), Vitamin D (Ergocalciferol), metFORMIN, irbesartan, metoprolol succinate, and ARMOUR THYROID.  Meds ordered this encounter  Medications  . irbesartan (AVAPRO) 150 MG tablet    Sig: Take 1 tablet (150 mg total) by mouth daily.    Dispense:  90 tablet    Refill:  3  . metoprolol succinate (TOPROL-XL) 25 MG 24 hr tablet    Sig: Take 1 tablet (25 mg total) by mouth daily.    Dispense:  90  tablet    Refill:  3  . ARMOUR THYROID 90 MG tablet    Sig: Take 1 tablet daily.    Dispense:  90 tablet    Refill:  3    CMA served as scribe during this visit. History, Physical and Plan performed by medical provider. Documentation and orders reviewed and attested to.  Penni Homans, MD

## 2017-04-01 NOTE — Assessment & Plan Note (Signed)
Well controlled, no changes to meds. Encouraged heart healthy diet such as the DASH diet and exercise as tolerated.  °

## 2017-04-01 NOTE — Assessment & Plan Note (Signed)
Is tolerating Metformin, side effects are improving

## 2017-04-01 NOTE — Assessment & Plan Note (Signed)
Is following with Dr Leafy Ro. Needs 7-8 hours of sleep nightly. Avoid trans fats, eat small, frequent meals every 4-5 hours with lean proteins, complex carbs and healthy fats. Minimize simple carbs, GMO foods.

## 2017-04-02 LAB — URINE CULTURE
MICRO NUMBER:: 81196722
SPECIMEN QUALITY: ADEQUATE

## 2017-04-02 LAB — URINALYSIS
BILIRUBIN URINE: NEGATIVE
Hgb urine dipstick: NEGATIVE
KETONES UR: NEGATIVE
LEUKOCYTES UA: NEGATIVE
Nitrite: NEGATIVE
PH: 6 (ref 5.0–8.0)
SPECIFIC GRAVITY, URINE: 1.02 (ref 1.000–1.030)
TOTAL PROTEIN, URINE-UPE24: NEGATIVE
URINE GLUCOSE: NEGATIVE
Urobilinogen, UA: 0.2 (ref 0.0–1.0)

## 2017-04-12 ENCOUNTER — Ambulatory Visit (INDEPENDENT_AMBULATORY_CARE_PROVIDER_SITE_OTHER): Admitting: Family Medicine

## 2017-04-12 VITALS — BP 127/82 | HR 63 | Temp 98.0°F | Ht 63.0 in | Wt 214.0 lb

## 2017-04-12 DIAGNOSIS — Z6838 Body mass index (BMI) 38.0-38.9, adult: Secondary | ICD-10-CM

## 2017-04-12 DIAGNOSIS — E559 Vitamin D deficiency, unspecified: Secondary | ICD-10-CM | POA: Diagnosis not present

## 2017-04-12 MED ORDER — VITAMIN D (ERGOCALCIFEROL) 1.25 MG (50000 UNIT) PO CAPS: 50000.0000 [IU] | ORAL_CAPSULE | ORAL | 0 refills | Status: DC

## 2017-04-12 NOTE — Progress Notes (Signed)
Office: [phone]  /  Fax: [phone]   HPI:   Chief Complaint: OBESITY Destiny [name] is here to discuss her progress with her obesity treatment plan. She is on the Category 2 plan and is following her eating plan approximately 70-75 % of the time. She states she is exercising 0 minutes 0 times per week. Destiny [name] has not been following her plan as closely. She notes increased cravings and increased celebration eating. She is retaining fluid likely from increased eating out and increased simple carbohydrates. She feels she will do well over Thanksgiving holiday.  Her weight is 214 lb (97.1 kg) today and has gained 3 pounds since her last visit. She has lost 12 lbs since starting treatment with [place].  Vitamin D deficiency Destiny [name] has a diagnosis of vitamin D deficiency. She is currently taking prescription Vit D, level is slowly improving but not yet at goal. She denies nausea, vomiting or muscle weakness.  ALLERGIES: Allergies  Allergen Reactions  . Diclofenac Itching, Nausea Only and Other (See Comments)    Wheezing  . Hydrochlorothiazide     Wheezing. Losing teeth.  . Statins     MEDICATIONS: Current Outpatient Medications on File Prior to Visit  Medication Sig Dispense Refill  . ARMOUR THYROID 90 MG tablet Take 1 tablet daily. 90 tablet 3  . Calcium-Magnesium 500-250 MG TABS Take by mouth.    . COD LIVER OIL PO Take by mouth. Reported on 07/16/2015    . Cyanocobalamin (VITAMIN B-12) 5000 MCG TBDP Take 5,000 mcg by mouth daily.    . irbesartan (AVAPRO) 150 MG tablet Take 1 tablet (150 mg total) by mouth daily. 90 tablet 3  . metFORMIN (GLUCOPHAGE) 500 MG tablet Take 500 mg by mouth daily with breakfast.    . metoprolol succinate (TOPROL-XL) 25 MG 24 hr tablet Take 1 tablet (25 mg total) by mouth daily. 90 tablet 3  . Probiotic Product (PROBIOTIC-10) CAPS Take by mouth.    . ranitidine (ZANTAC) 150 MG capsule Take 150 mg by mouth daily as needed for heartburn.    . RED YEAST RICE  EXTRACT PO Take by mouth. Reported on 07/16/2015    . Wheat Dextrin (BENEFIBER DRINK MIX PO) Take by mouth.     No current facility-administered medications on file prior to visit.     PAST MEDICAL HISTORY: Past Medical History:  Diagnosis Date  . Abnormal blood chemistry   . Arthropathy   . Back pain   . Breast cyst   . Cellulitis   . Chest discomfort   . Constipation   . Disturbance of skin sensation   . Edema   . Encounter for screening and preventative care 06/17/2016  . GERD (gastroesophageal reflux disease)   . Hiatal hernia with gastroesophageal reflux 06/17/2016  . High risk medication use   . Hoarseness 06/15/2016  . Hyperglycemia   . Hyperglycemia 12/21/2016  . Hyperlipidemia   . Hyperlipidemia, mixed 06/15/2016  . Hypertension   . Hypopotassemia   . Hypothyroidism   . Joint pain   . Menopause   . Obesity   . Palpitations   . Positive ANA (antinuclear antibody)   . Shoulder pain, right   . Sleep apnea   . SOB (shortness of breath)   . Thyroid disease 12/11/2014  . Thyroid nodule   . Urinary incontinence 04/01/2017  . Vitamin D deficiency   . Vitamin D deficiency     PAST SURGICAL HISTORY: Past Surgical History:  Procedure Laterality Date  . BREAST  CYST ASPIRATION Left    30 years ago   . LIPOSUCTION    . WISDOM TOOTH EXTRACTION     and all upper teeth    SOCIAL HISTORY: Social History   Tobacco Use  . Smoking status: Former Smoker    Packs/day: 1.00    Years: 27.00    Pack years: 27.00    Types: Cigarettes  . Smokeless tobacco: Never Used  Substance Use Topics  . Alcohol use: Yes    Alcohol/week: 0.0 oz  . Drug use: No    FAMILY HISTORY: Family History  Problem Relation Age of Onset  . Irregular heart beat Mother   . Heart disease Mother   . Rheumatic fever Mother   . Hypertension Father   . Ulcers Father   . Hyperlipidemia Father   . Vision loss Brother   . Stroke Maternal Grandmother   . Diabetes Maternal Grandmother   . Hypertension  Paternal Grandmother   . Ulcers Paternal Grandfather     ROS: Review of Systems  Constitutional: Negative for weight loss.  Gastrointestinal: Negative for nausea and vomiting.  Musculoskeletal:       Negative muscle weakness    PHYSICAL EXAM: Blood pressure 127/82, pulse 63, temperature 98 F (36.7 C), temperature source Oral, height 5\' 3"  (1.6 m), weight 214 lb (97.1 kg), SpO2 98 %. Body mass index is 37.91 kg/m. Physical Exam  Constitutional: She is oriented to person, place, and time. She appears well-developed and well-nourished.  Cardiovascular: Normal rate.  Pulmonary/Chest: Effort normal.  Musculoskeletal: Normal range of motion.  Neurological: She is oriented to person, place, and time.  Skin: Skin is warm and dry.  Psychiatric: She has a normal mood and affect. Her behavior is normal.  Vitals reviewed.   RECENT LABS AND TESTS: BMET    Component Value Date/Time   NA 140 03/29/2017 0815   K 4.7 03/29/2017 0815   CL 99 03/29/2017 0815   CO2 23 03/29/2017 0815   GLUCOSE 97 03/29/2017 0815   GLUCOSE 90 12/21/2016 1654   BUN 12 03/29/2017 0815   CREATININE 0.81 03/29/2017 0815   CALCIUM 9.9 03/29/2017 0815   GFRNONAA 77 03/29/2017 0815   GFRAA 89 03/29/2017 0815   Lab Results  Component Value Date   HGBA1C 5.6 03/29/2017   HGBA1C 5.7 12/21/2016   Lab Results  Component Value Date   INSULIN 34.5 (H) 03/29/2017   INSULIN 42.5 (H) 12/24/2016   CBC    Component Value Date/Time   WBC 5.4 12/21/2016 1654   RBC 4.56 12/21/2016 1654   HGB 14.0 12/21/2016 1654   HCT 41.2 12/21/2016 1654   PLT 307.0 12/21/2016 1654   MCV 90.4 12/21/2016 1654   MCHC 33.9 12/21/2016 1654   RDW 13.1 12/21/2016 1654   LYMPHSABS 1.4 12/21/2016 1654   MONOABS 0.5 12/21/2016 1654   EOSABS 0.3 12/21/2016 1654   BASOSABS 0.1 12/21/2016 1654   Iron/TIBC/Ferritin/ %Sat No results found for: IRON, TIBC, FERRITIN, IRONPCTSAT Lipid Panel     Component Value Date/Time   CHOL 254  (H) 03/29/2017 0815   TRIG 256 (H) 03/29/2017 0815   HDL 41 03/29/2017 0815   CHOLHDL 5 12/21/2016 1654   VLDL 48.0 (H) 12/21/2016 1654   LDLCALC 162 (H) 03/29/2017 0815   LDLDIRECT 146.0 12/21/2016 1654   Hepatic Function Panel     Component Value Date/Time   PROT 7.1 03/29/2017 0815   ALBUMIN 4.4 03/29/2017 0815   AST 16 03/29/2017 0815  ALT 22 03/29/2017 0815   ALKPHOS 79 03/29/2017 0815   BILITOT 0.4 03/29/2017 0815      Component Value Date/Time   TSH 4.25 12/21/2016 1654   TSH 2.86 07/16/2016 1526   TSH 5.10 (H) 05/11/2016 1108    ASSESSMENT AND PLAN: Vitamin D deficiency - Plan: Vitamin D, Ergocalciferol, (DRISDOL) 50000 units CAPS capsule  Class 2 severe obesity with serious comorbidity and body mass index (BMI) of 38.0 to 38.9 in adult, unspecified obesity type (HCC)  PLAN:  Vitamin D Deficiency Destiny [name] was informed that low vitamin D levels contributes to fatigue and are associated with obesity, breast, and colon cancer. Destiny [name] agrees to continue taking prescription Vit D @50 ,000 IU every week #4 and we will refill for 1 month. She will follow up for routine testing of vitamin D, at least 2-3 times per year. She was informed of the risk of over-replacement of vitamin D and agrees to not increase her dose unless he discusses this with [place] first. Destiny [name] agrees to follow up with our clinic in 3 weeks.  Obesity Destiny [name] is currently in the action stage of change. As such, her goal is to continue with weight loss efforts She has agreed to change to keep a food journal with 1000-1200 calories and 70 grams of protein daily Destiny [name] has been instructed to work up to a goal of 150 minutes of combined cardio and strengthening exercise per week or cardio for 10 minutes per day for weight loss and overall health benefits. We discussed the following Behavioral Modification Strategies today: increasing lean protein intake, decreasing simple carbohydrates, holiday eating  strategies, celebration eating strategies, and keep a strict food journal    Destiny [name] has agreed to follow up with our clinic in 3 weeks. She was informed of the importance of frequent follow up visits to maximize her success with intensive lifestyle modifications for her multiple health conditions.  I, Trixie Dredge, am acting as transcriptionist for Dennard Nip, MD  I have reviewed the above documentation for accuracy and completeness, and I agree with the above. -Dennard Nip, MD      Today's visit was # 8 out of 22.  Starting weight: 226 lbs Starting date: 12/24/16 Today's weight : 214 lbs  Today's date: 04/12/2017 Total lbs lost to date: 12 (Patients must lose 7 lbs in the first 6 months to continue with counseling)   ASK: We discussed the diagnosis of obesity with Destiny [name] today and Destiny [name] agreed to give [place] permission to discuss obesity behavioral modification therapy today.  ASSESS: Destiny [name] has the diagnosis of obesity and her BMI today is 37.92 Destiny [name] is in the action stage of change   ADVISE: Destiny [name] was educated on the multiple health risks of obesity as well as the benefit of weight loss to improve her health. She was advised of the need for long term treatment and the importance of lifestyle modifications.  AGREE: Multiple dietary modification options and treatment options were discussed and  Destiny [name] agreed to keep a food journal with 1000-1200 calories and 70 grams of protein daily We discussed the following Behavioral Modification Strategies today: increasing lean protein intake, decreasing simple carbohydrates, holiday eating strategies, celebration eating strategies, and keep a strict food journal.

## 2017-04-15 ENCOUNTER — Telehealth (INDEPENDENT_AMBULATORY_CARE_PROVIDER_SITE_OTHER): Payer: Self-pay | Admitting: Family Medicine

## 2017-04-15 NOTE — Telephone Encounter (Signed)
[name] returned the call and spoke with the patient. Destiny [name], [place]

## 2017-04-15 NOTE — Telephone Encounter (Signed)
70 grams of protein is what she was told to consume , she needs to know how much is 70g and she needs more detail.  Please call her at [phone]

## 2017-05-05 ENCOUNTER — Ambulatory Visit (INDEPENDENT_AMBULATORY_CARE_PROVIDER_SITE_OTHER): Admitting: Family Medicine

## 2017-05-05 VITALS — BP 138/66 | HR 68 | Temp 98.1°F | Ht 63.0 in | Wt 213.0 lb

## 2017-05-05 DIAGNOSIS — E559 Vitamin D deficiency, unspecified: Secondary | ICD-10-CM

## 2017-05-05 DIAGNOSIS — R7303 Prediabetes: Secondary | ICD-10-CM

## 2017-05-05 DIAGNOSIS — Z6837 Body mass index (BMI) 37.0-37.9, adult: Secondary | ICD-10-CM | POA: Diagnosis not present

## 2017-05-05 MED ORDER — METFORMIN HCL 500 MG PO TABS: 500.0000 mg | ORAL_TABLET | Freq: Every day | ORAL | 0 refills | Status: DC

## 2017-05-05 MED ORDER — VITAMIN D (ERGOCALCIFEROL) 1.25 MG (50000 UNIT) PO CAPS: 50000.0000 [IU] | ORAL_CAPSULE | ORAL | 0 refills | Status: DC

## 2017-05-05 NOTE — Progress Notes (Signed)
Office: [phone]  /  Fax: [phone]   HPI:   Chief Complaint: OBESITY Destiny [name] is here to discuss her progress with her obesity treatment plan. She is on the keep a food journal with 1000-1200 calories and 70 grams of protein daily and is following her eating plan approximately 75 % of the time. She states she is exercising 0 minutes 0 times per week. Destiny [name] did well with portion control over Thanksgiving. She was mindful about portions and making smarter choices. She is working on increasing her protein and she has started a little walking for exercise.  Her weight is 213 lb (96.6 kg) today and has had a weight loss of 1 pound over a period of 3 weeks since her last visit. She has lost 13 lbs since starting treatment with [place].  Pre-Diabetes Destiny [name] has a diagnosis of pre-diabetes based on her elevated Hgb A1c and was informed this puts her at greater risk of developing diabetes. She is doing well on metformin and feeling decreased polyphagia. She denies nausea or hypoglycemia.  Vitamin D deficiency Destiny [name] has a diagnosis of vitamin D deficiency. She is stable on prescription Vit D, but not yet at goal. She denies nausea, vomiting or muscle weakness.  ALLERGIES: Allergies  Allergen Reactions  . Diclofenac Itching, Nausea Only and Other (See Comments)    Wheezing  . Hydrochlorothiazide     Wheezing. Losing teeth.  . Statins     MEDICATIONS: Current Outpatient Medications on File Prior to Visit  Medication Sig Dispense Refill  . ARMOUR THYROID 90 MG tablet Take 1 tablet daily. 90 tablet 3  . Calcium-Magnesium 500-250 MG TABS Take by mouth.    . COD LIVER OIL PO Take by mouth. Reported on 07/16/2015    . Cyanocobalamin (VITAMIN B-12) 5000 MCG TBDP Take 5,000 mcg by mouth daily.    . irbesartan (AVAPRO) 150 MG tablet Take 1 tablet (150 mg total) by mouth daily. 90 tablet 3  . metFORMIN (GLUCOPHAGE) 500 MG tablet Take 500 mg by mouth daily with breakfast.    . metoprolol  succinate (TOPROL-XL) 25 MG 24 hr tablet Take 1 tablet (25 mg total) by mouth daily. 90 tablet 3  . Probiotic Product (PROBIOTIC-10) CAPS Take by mouth.    . ranitidine (ZANTAC) 150 MG capsule Take 150 mg by mouth daily as needed for heartburn.    . RED YEAST RICE EXTRACT PO Take by mouth. Reported on 07/16/2015    . Vitamin D, Ergocalciferol, (DRISDOL) 50000 units CAPS capsule Take 1 capsule (50,000 Units total) every 7 (seven) days by mouth. 4 capsule 0  . Wheat Dextrin (BENEFIBER DRINK MIX PO) Take by mouth.     No current facility-administered medications on file prior to visit.     PAST MEDICAL HISTORY: Past Medical History:  Diagnosis Date  . Abnormal blood chemistry   . Arthropathy   . Back pain   . Breast cyst   . Cellulitis   . Chest discomfort   . Constipation   . Disturbance of skin sensation   . Edema   . Encounter for screening and preventative care 06/17/2016  . GERD (gastroesophageal reflux disease)   . Hiatal hernia with gastroesophageal reflux 06/17/2016  . High risk medication use   . Hoarseness 06/15/2016  . Hyperglycemia   . Hyperglycemia 12/21/2016  . Hyperlipidemia   . Hyperlipidemia, mixed 06/15/2016  . Hypertension   . Hypopotassemia   . Hypothyroidism   . Joint pain   . Menopause   .  Obesity   . Palpitations   . Positive ANA (antinuclear antibody)   . Shoulder pain, right   . Sleep apnea   . SOB (shortness of breath)   . Thyroid disease 12/11/2014  . Thyroid nodule   . Urinary incontinence 04/01/2017  . Vitamin D deficiency   . Vitamin D deficiency     PAST SURGICAL HISTORY: Past Surgical History:  Procedure Laterality Date  . BREAST CYST ASPIRATION Left    30 years ago   . LIPOSUCTION    . WISDOM TOOTH EXTRACTION     and all upper teeth    SOCIAL HISTORY: Social History   Tobacco Use  . Smoking status: Former Smoker    Packs/day: 1.00    Years: 27.00    Pack years: 27.00    Types: Cigarettes  . Smokeless tobacco: Never Used    Substance Use Topics  . Alcohol use: Yes    Alcohol/week: 0.0 oz  . Drug use: No    FAMILY HISTORY: Family History  Problem Relation Age of Onset  . Irregular heart beat Mother   . Heart disease Mother   . Rheumatic fever Mother   . Hypertension Father   . Ulcers Father   . Hyperlipidemia Father   . Vision loss Brother   . Stroke Maternal Grandmother   . Diabetes Maternal Grandmother   . Hypertension Paternal Grandmother   . Ulcers Paternal Grandfather     ROS: Review of Systems  Constitutional: Positive for weight loss.  Gastrointestinal: Negative for nausea and vomiting.  Musculoskeletal:       Negative muscle weakness  Endo/Heme/Allergies:       Negative hypoglycemia Positive polyphagia    PHYSICAL EXAM: Blood pressure 138/66, pulse 68, temperature 98.1 F (36.7 C), temperature source Oral, height 5\' 3"  (1.6 m), weight 213 lb (96.6 kg), SpO2 97 %. Body mass index is 37.73 kg/m. Physical Exam  Constitutional: She is oriented to person, place, and time. She appears well-developed and well-nourished.  Cardiovascular: Normal rate.  Pulmonary/Chest: Effort normal.  Musculoskeletal: Normal range of motion.  Neurological: She is oriented to person, place, and time.  Skin: Skin is warm and dry.  Psychiatric: She has a normal mood and affect. Her behavior is normal.  Vitals reviewed.   RECENT LABS AND TESTS: BMET    Component Value Date/Time   NA 140 03/29/2017 0815   K 4.7 03/29/2017 0815   CL 99 03/29/2017 0815   CO2 23 03/29/2017 0815   GLUCOSE 97 03/29/2017 0815   GLUCOSE 90 12/21/2016 1654   BUN 12 03/29/2017 0815   CREATININE 0.81 03/29/2017 0815   CALCIUM 9.9 03/29/2017 0815   GFRNONAA 77 03/29/2017 0815   GFRAA 89 03/29/2017 0815   Lab Results  Component Value Date   HGBA1C 5.6 03/29/2017   HGBA1C 5.7 12/21/2016   Lab Results  Component Value Date   INSULIN 34.5 (H) 03/29/2017   INSULIN 42.5 (H) 12/24/2016   CBC    Component Value  Date/Time   WBC 5.4 12/21/2016 1654   RBC 4.56 12/21/2016 1654   HGB 14.0 12/21/2016 1654   HCT 41.2 12/21/2016 1654   PLT 307.0 12/21/2016 1654   MCV 90.4 12/21/2016 1654   MCHC 33.9 12/21/2016 1654   RDW 13.1 12/21/2016 1654   LYMPHSABS 1.4 12/21/2016 1654   MONOABS 0.5 12/21/2016 1654   EOSABS 0.3 12/21/2016 1654   BASOSABS 0.1 12/21/2016 1654   Iron/TIBC/Ferritin/ %Sat No results found for: IRON, TIBC, FERRITIN, IRONPCTSAT  Lipid Panel     Component Value Date/Time   CHOL 254 (H) 03/29/2017 0815   TRIG 256 (H) 03/29/2017 0815   HDL 41 03/29/2017 0815   CHOLHDL 5 12/21/2016 1654   VLDL 48.0 (H) 12/21/2016 1654   LDLCALC 162 (H) 03/29/2017 0815   LDLDIRECT 146.0 12/21/2016 1654   Hepatic Function Panel     Component Value Date/Time   PROT 7.1 03/29/2017 0815   ALBUMIN 4.4 03/29/2017 0815   AST 16 03/29/2017 0815   ALT 22 03/29/2017 0815   ALKPHOS 79 03/29/2017 0815   BILITOT 0.4 03/29/2017 0815      Component Value Date/Time   TSH 4.25 12/21/2016 1654   TSH 2.86 07/16/2016 1526   TSH 5.10 (H) 05/11/2016 1108    ASSESSMENT AND PLAN: Prediabetes - Plan: metFORMIN (GLUCOPHAGE) 500 MG tablet  Vitamin D deficiency - Plan: Vitamin D, Ergocalciferol, (DRISDOL) 50000 units CAPS capsule  Class 2 severe obesity with serious comorbidity and body mass index (BMI) of 37.0 to 37.9 in adult, unspecified obesity type ([place])  PLAN:  Pre-Diabetes Destiny [name] will continue to work on weight loss, exercise, and decreasing simple carbohydrates in her diet to help decrease the risk of diabetes. We dicussed metformin including benefits and risks. She was informed that eating too many simple carbohydrates or too many calories at one sitting increases the likelihood of GI side effects. Destiny [name] agrees to continue taking metformin 500 mg q AM #30 and we will refill for 1 month. Destiny [name] agrees to follow up with our clinic in 2 weeks as directed to monitor her progress.  Vitamin D  Deficiency Destiny [name] was informed that low vitamin D levels contributes to fatigue and are associated with obesity, breast, and colon cancer. Destiny [name] agrees to continue taking prescription Vit D @50 ,000 IU every week #4 and we will refill for 1 month. She will follow up for routine testing of vitamin D, at least 2-3 times per year. She was informed of the risk of over-replacement of vitamin D and agrees to not increase her dose unless he discusses this with [place] first.  Obesity Destiny [name] is currently in the action stage of change. As such, her goal is to continue with weight loss efforts She has agreed to keep a food journal with 1000-1200 calories and 70+ grams of protein daily Destiny [name] has been instructed to work up to a goal of 150 minutes of combined cardio and strengthening exercise per week or walking for 10-15 minutes once daily for weight loss and overall health benefits. We discussed the following Behavioral Modification Strategies today: increasing lean protein intake, decreasing simple carbohydrates  and work on meal planning and easy cooking plans   Destiny [name] has agreed to follow up with our clinic in 2 weeks. She was informed of the importance of frequent follow up visits to maximize her success with intensive lifestyle modifications for her multiple health conditions.  I, Trixie Dredge, am acting as transcriptionist for Dennard Nip, MD  I have reviewed the above documentation for accuracy and completeness, and I agree with the above. -Dennard Nip, MD     Today's visit was # 9 out of 22.  Starting weight: 226 lbs Starting date: 12/24/16 Today's weight : 213 lbs  Today's date: 05/05/2017 Total lbs lost to date: 13 (Patients must lose 7 lbs in the first 6 months to continue with counseling)   ASK: We discussed the diagnosis of obesity with Destiny [name] today and Destiny [name] agreed to give [place] permission to discuss  obesity behavioral modification therapy today.  ASSESS: Destiny [name] has  the diagnosis of obesity and her BMI today is 37.74 Destiny [name] is in the action stage of change   ADVISE: Destiny [name] was educated on the multiple health risks of obesity as well as the benefit of weight loss to improve her health. She was advised of the need for long term treatment and the importance of lifestyle modifications.  AGREE: Multiple dietary modification options and treatment options were discussed and  Destiny [name] agreed to keep a food journal with 1000-1200 calories and 70+ grams of protein daily We discussed the following Behavioral Modification Strategies today: increasing lean protein intake, decreasing simple carbohydrates  and work on meal planning and easy cooking plans

## 2017-05-17 ENCOUNTER — Ambulatory Visit (INDEPENDENT_AMBULATORY_CARE_PROVIDER_SITE_OTHER): Admitting: Family Medicine

## 2017-05-19 ENCOUNTER — Ambulatory Visit (INDEPENDENT_AMBULATORY_CARE_PROVIDER_SITE_OTHER): Admitting: Family Medicine

## 2017-06-09 ENCOUNTER — Ambulatory Visit (INDEPENDENT_AMBULATORY_CARE_PROVIDER_SITE_OTHER): Admitting: Family Medicine

## 2017-06-09 ENCOUNTER — Encounter (INDEPENDENT_AMBULATORY_CARE_PROVIDER_SITE_OTHER): Payer: Self-pay

## 2017-06-10 ENCOUNTER — Other Ambulatory Visit (INDEPENDENT_AMBULATORY_CARE_PROVIDER_SITE_OTHER): Payer: Self-pay | Admitting: Family Medicine

## 2017-06-10 DIAGNOSIS — E559 Vitamin D deficiency, unspecified: Secondary | ICD-10-CM

## 2017-06-10 DIAGNOSIS — R7303 Prediabetes: Secondary | ICD-10-CM

## 2017-06-19 IMAGING — DX DG FOOT COMPLETE 3+V*R*
3 series · 3 of 3 positions shown · non-contrast
Comparison: None.

CLINICAL DATA: Twisted RIGHT foot and ankle years ago , pain for 1
month, worsening for 2 years. Swelling.

EXAM:
RIGHT ANKLE - COMPLETE 3+ VIEW; RIGHT FOOT COMPLETE - 3+ VIEW

[foot ap]
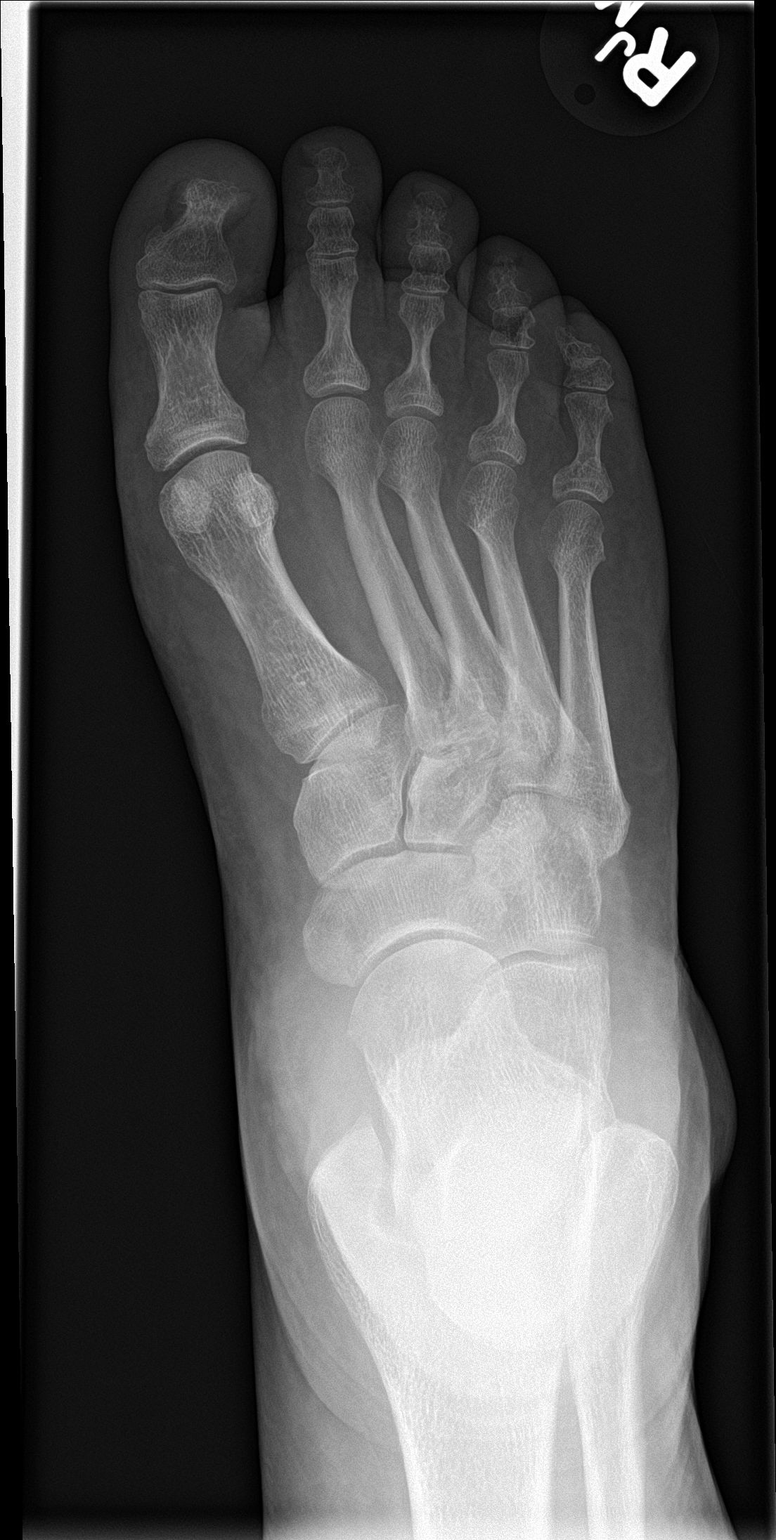

[foot obl]
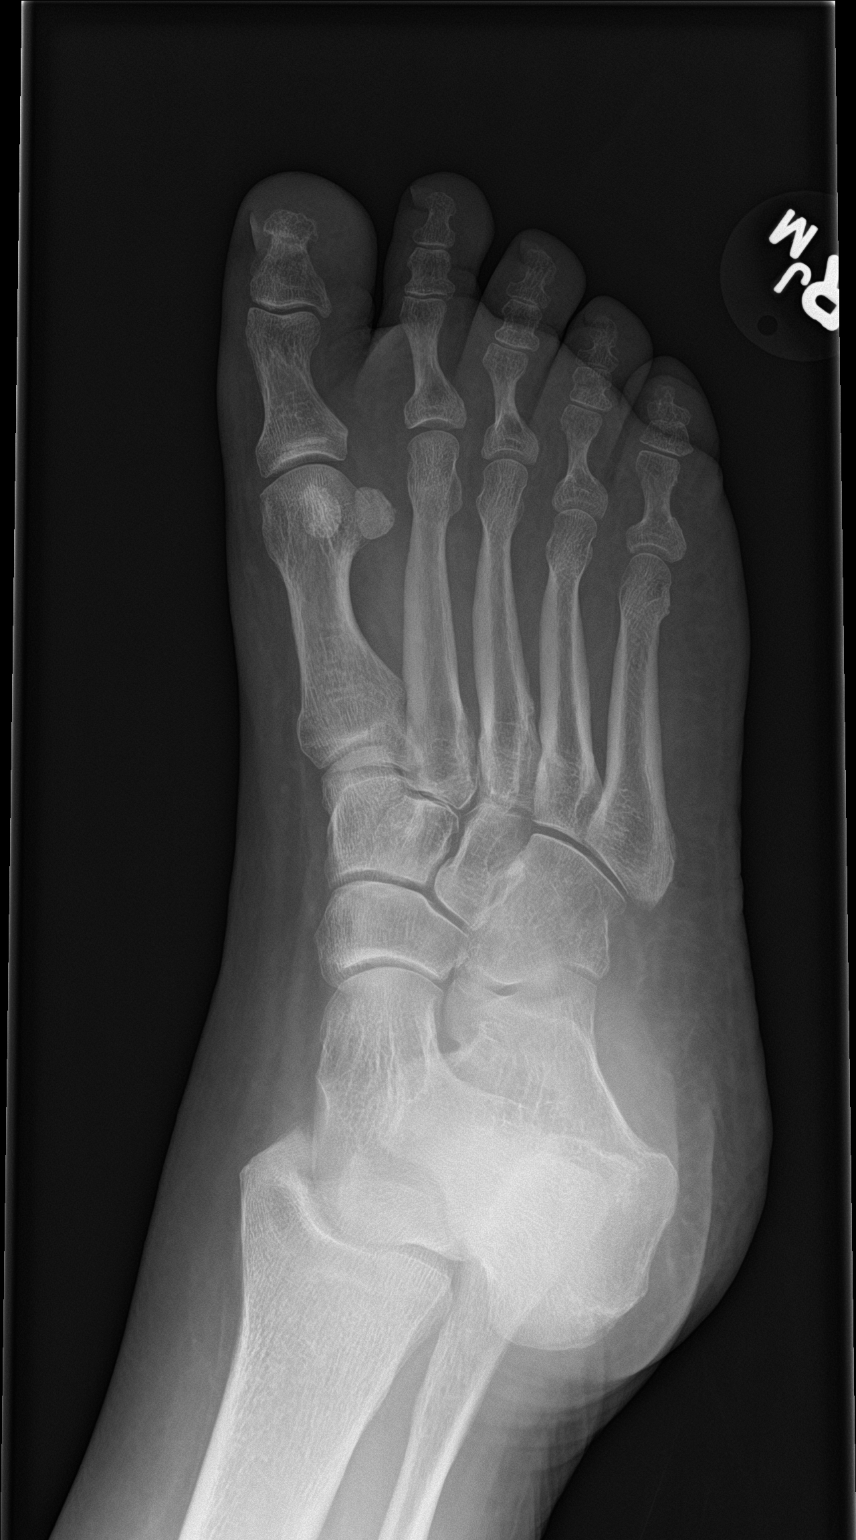

[foot lat]
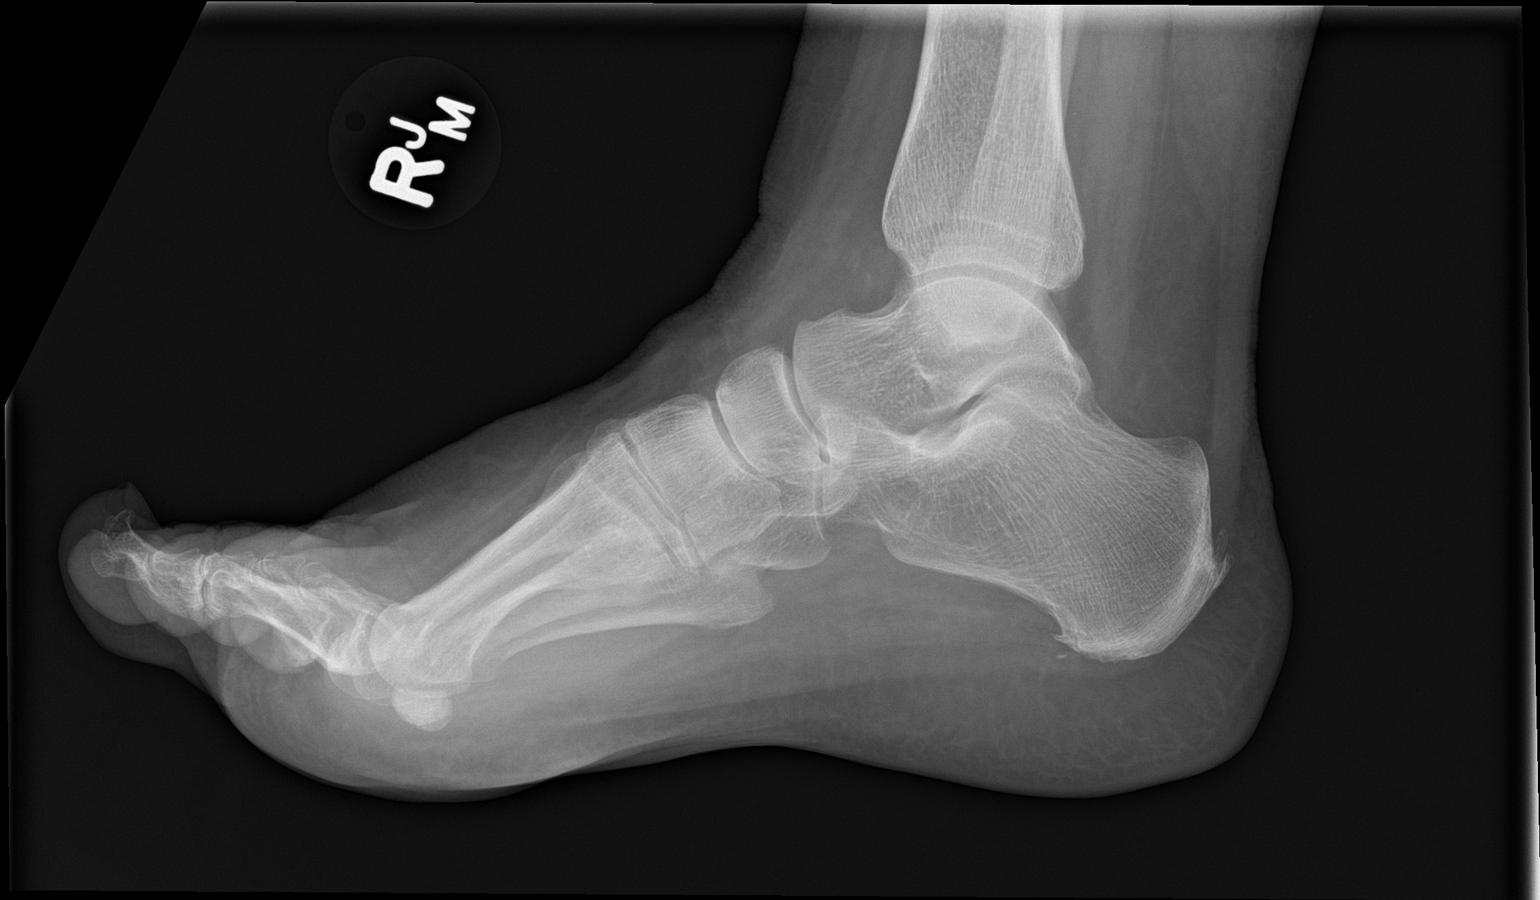

[3 of 3 positions shown; findings below may reference images not displayed]

FINDINGS: RIGHT ankle: No fracture deformity nor dislocation. Mild acute
Achilles insertional enthesopathy. The ankle mortise appears
congruent and the tibiofibular syndesmosis intact. No destructive
bony lesions. Mild soft tissue swelling versus habitus.

RIGHT foot: No acute fracture deformity or dislocation. Small
plantar calcaneal spur. Joint spaces intact without erosions. No
destructive bony lesions. Soft tissue planes are not suspicious.
IMPRESSION: Negative RIGHT ankle and RIGHT foot radiographs.

## 2017-06-19 IMAGING — DX DG ANKLE COMPLETE 3+V*R*
4 series · 4 of 4 positions shown · non-contrast
Comparison: None.

CLINICAL DATA: Twisted RIGHT foot and ankle years ago , pain for 1
month, worsening for 2 years. Swelling.

EXAM:
RIGHT ANKLE - COMPLETE 3+ VIEW; RIGHT FOOT COMPLETE - 3+ VIEW

[ankle ap]
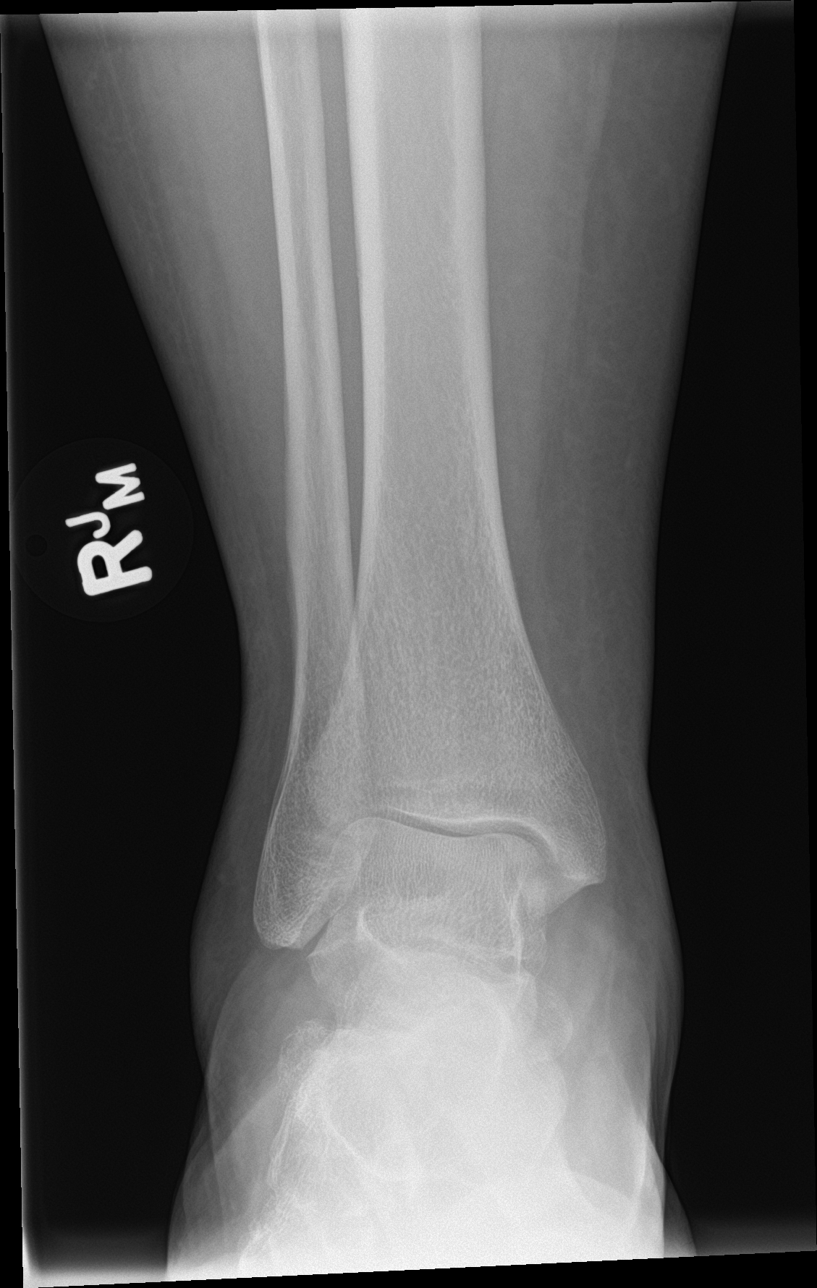

[ankle obl (1 of 2)]
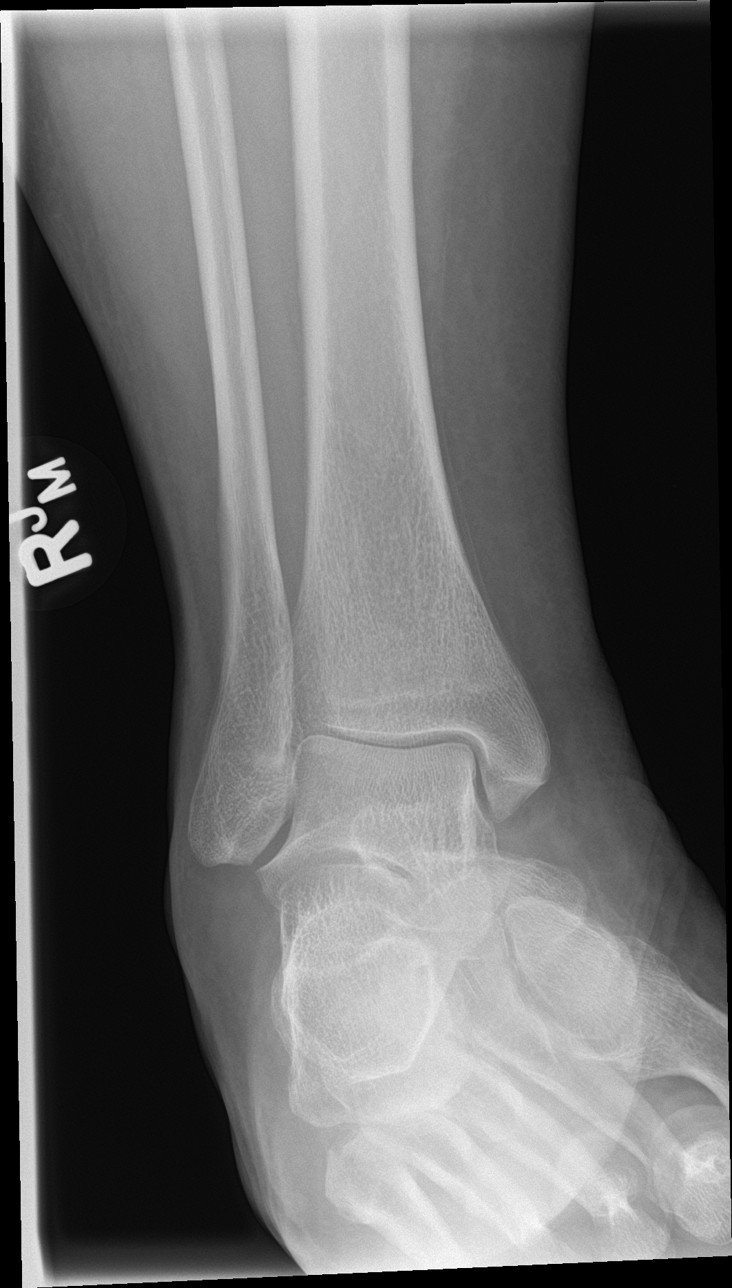

[ankle lat]
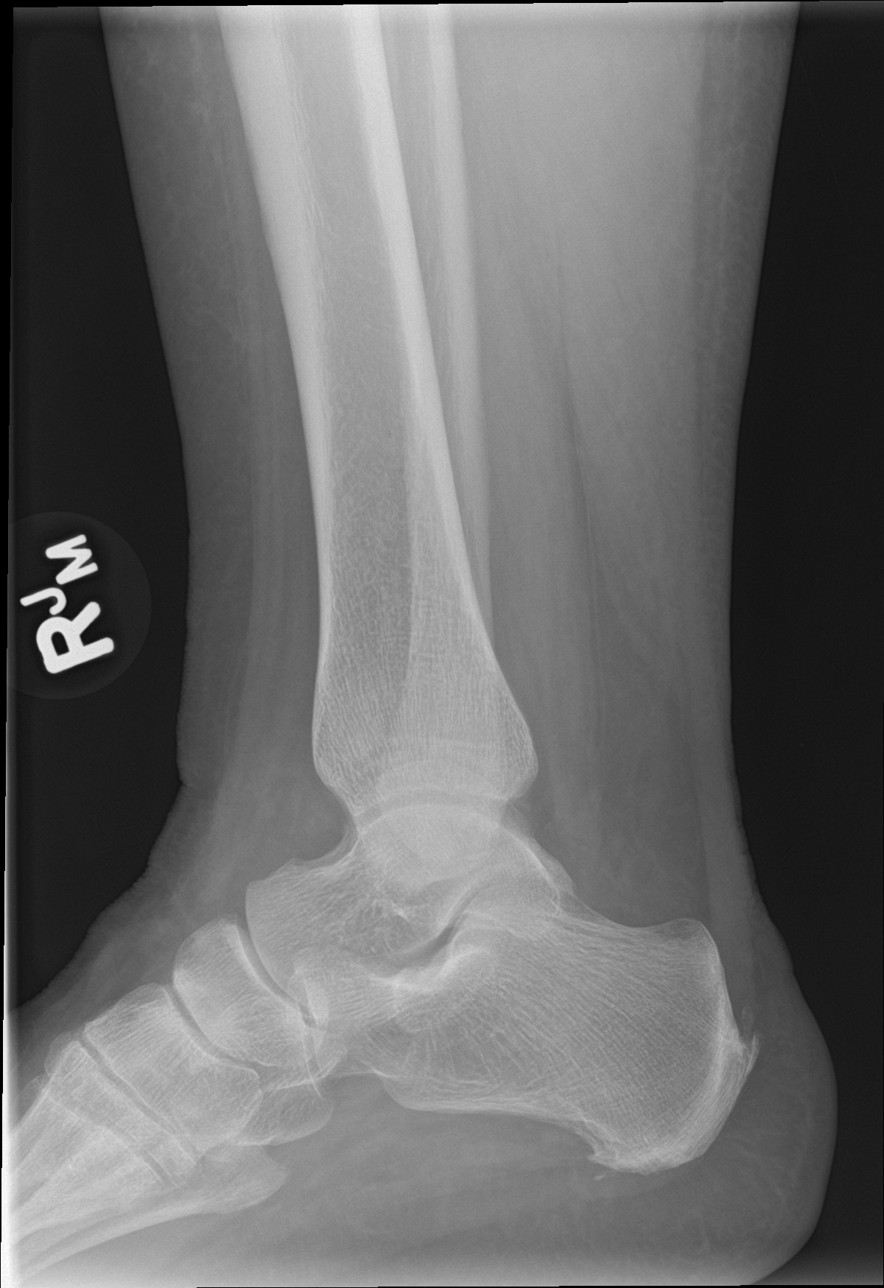

[ankle obl (2 of 2)]
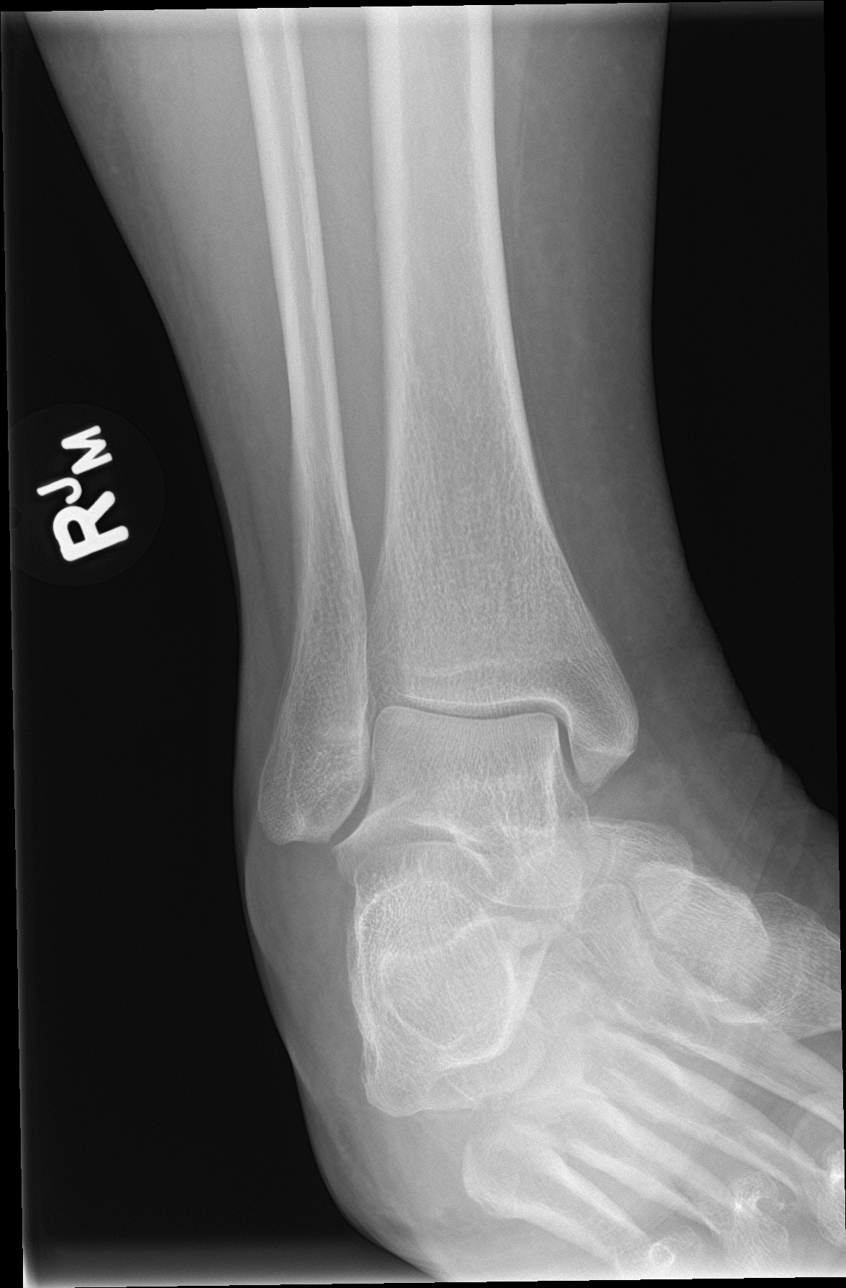

[4 of 4 positions shown; findings below may reference images not displayed]

FINDINGS: RIGHT ankle: No fracture deformity nor dislocation. Mild acute
Achilles insertional enthesopathy. The ankle mortise appears
congruent and the tibiofibular syndesmosis intact. No destructive
bony lesions. Mild soft tissue swelling versus habitus.

RIGHT foot: No acute fracture deformity or dislocation. Small
plantar calcaneal spur. Joint spaces intact without erosions. No
destructive bony lesions. Soft tissue planes are not suspicious.
IMPRESSION: Negative RIGHT ankle and RIGHT foot radiographs.

## 2017-06-23 ENCOUNTER — Ambulatory Visit (INDEPENDENT_AMBULATORY_CARE_PROVIDER_SITE_OTHER): Admitting: Family Medicine

## 2017-06-23 VITALS — BP 114/76 | HR 72 | Temp 97.9°F | Ht 63.0 in | Wt 218.0 lb

## 2017-06-23 DIAGNOSIS — R7303 Prediabetes: Secondary | ICD-10-CM

## 2017-06-23 DIAGNOSIS — Z6838 Body mass index (BMI) 38.0-38.9, adult: Secondary | ICD-10-CM | POA: Diagnosis not present

## 2017-06-23 DIAGNOSIS — E559 Vitamin D deficiency, unspecified: Secondary | ICD-10-CM | POA: Diagnosis not present

## 2017-06-23 MED ORDER — VITAMIN D (ERGOCALCIFEROL) 1.25 MG (50000 UNIT) PO CAPS: 50000.0000 [IU] | ORAL_CAPSULE | ORAL | 0 refills | Status: DC

## 2017-06-23 MED ORDER — METFORMIN HCL 500 MG PO TABS: 500.0000 mg | ORAL_TABLET | Freq: Every day | ORAL | 0 refills | Status: DC

## 2017-06-23 NOTE — Progress Notes (Signed)
Office: [phone]  /  Fax: [phone]   HPI:   Chief Complaint: OBESITY Destiny [name] is here to discuss her progress with her obesity treatment plan. She is on the keep a food journal with 1000-1200 calories and 70+ grams of protein daily and is following her eating plan approximately 0 % of the time. She states she is exercising 0 minutes 0 times per week. Destiny [name] has been off track in the last 2 weeks while sick with upper respiratory infection. She decreased protein and drank a lot of orange juice. She is starting to feel better and ready to get back on track.  Her weight is 218 lb (98.9 kg) today and has gained 5 pounds since her last visit. She has lost 8 lbs since starting treatment with [place].  Vitamin D deficiency Destiny [name] has a diagnosis of vitamin D deficiency. She is stable on prescription Vit D, but not yet at goal. She denies nausea, vomiting or muscle weakness.  Pre-Diabetes Destiny [name] has a diagnosis of pre-diabetes based on her elevated Hgb A1c and was informed this puts her at greater risk of developing diabetes. She is on metformin but has increase sugar while sick, she is ready to get back on track and continues to work on diet and exercise to decrease risk of diabetes. She denies nausea or hypoglycemia.  ALLERGIES: Allergies  Allergen Reactions  . Diclofenac Itching, Nausea Only and Other (See Comments)    Wheezing  . Hydrochlorothiazide     Wheezing. Losing teeth.  . Statins     MEDICATIONS: Current Outpatient Medications on File Prior to Visit  Medication Sig Dispense Refill  . ARMOUR THYROID 90 MG tablet Take 1 tablet daily. 90 tablet 3  . Calcium-Magnesium 500-250 MG TABS Take by mouth.    . COD LIVER OIL PO Take by mouth. Reported on 07/16/2015    . Cyanocobalamin (VITAMIN B-12) 5000 MCG TBDP Take 5,000 mcg by mouth daily.    . irbesartan (AVAPRO) 150 MG tablet Take 1 tablet (150 mg total) by mouth daily. 90 tablet 3  . metoprolol succinate (TOPROL-XL) 25 MG 24  hr tablet Take 1 tablet (25 mg total) by mouth daily. 90 tablet 3  . Probiotic Product (PROBIOTIC-10) CAPS Take by mouth.    . ranitidine (ZANTAC) 150 MG capsule Take 150 mg by mouth daily as needed for heartburn.    . RED YEAST RICE EXTRACT PO Take by mouth. Reported on 07/16/2015    . Wheat Dextrin (BENEFIBER DRINK MIX PO) Take by mouth.     No current facility-administered medications on file prior to visit.     PAST MEDICAL HISTORY: Past Medical History:  Diagnosis Date  . Abnormal blood chemistry   . Arthropathy   . Back pain   . Breast cyst   . Cellulitis   . Chest discomfort   . Constipation   . Disturbance of skin sensation   . Edema   . Encounter for screening and preventative care 06/17/2016  . GERD (gastroesophageal reflux disease)   . Hiatal hernia with gastroesophageal reflux 06/17/2016  . High risk medication use   . Hoarseness 06/15/2016  . Hyperglycemia   . Hyperglycemia 12/21/2016  . Hyperlipidemia   . Hyperlipidemia, mixed 06/15/2016  . Hypertension   . Hypopotassemia   . Hypothyroidism   . Joint pain   . Menopause   . Obesity   . Palpitations   . Positive ANA (antinuclear antibody)   . Shoulder pain, right   . Sleep apnea   .  SOB (shortness of breath)   . Thyroid disease 12/11/2014  . Thyroid nodule   . Urinary incontinence 04/01/2017  . Vitamin D deficiency   . Vitamin D deficiency     PAST SURGICAL HISTORY: Past Surgical History:  Procedure Laterality Date  . BREAST CYST ASPIRATION Left    30 years ago   . LIPOSUCTION    . WISDOM TOOTH EXTRACTION     and all upper teeth    SOCIAL HISTORY: Social History   Tobacco Use  . Smoking status: Former Smoker    Packs/day: 1.00    Years: 27.00    Pack years: 27.00    Types: Cigarettes  . Smokeless tobacco: Never Used  Substance Use Topics  . Alcohol use: Yes    Alcohol/week: 0.0 oz  . Drug use: No    FAMILY HISTORY: Family History  Problem Relation Age of Onset  . Irregular heart beat  Mother   . Heart disease Mother   . Rheumatic fever Mother   . Hypertension Father   . Ulcers Father   . Hyperlipidemia Father   . Vision loss Brother   . Stroke Maternal Grandmother   . Diabetes Maternal Grandmother   . Hypertension Paternal Grandmother   . Ulcers Paternal Grandfather     ROS: Review of Systems  Constitutional: Negative for weight loss.  Gastrointestinal: Negative for nausea and vomiting.  Musculoskeletal:       Negative muscle weakness  Endo/Heme/Allergies:       Negative hypoglycemia    PHYSICAL EXAM: Blood pressure 114/76, pulse 72, temperature 97.9 F (36.6 C), temperature source Oral, height 5\' 3"  (1.6 m), weight 218 lb (98.9 kg), SpO2 97 %. Body mass index is 38.62 kg/m. Physical Exam  Constitutional: She is oriented to person, place, and time. She appears well-developed and well-nourished.  Cardiovascular: Normal rate.  Pulmonary/Chest: Effort normal.  Musculoskeletal: Normal range of motion.  Neurological: She is oriented to person, place, and time.  Skin: Skin is warm and dry.  Psychiatric: She has a normal mood and affect. Her behavior is normal.  Vitals reviewed.   RECENT LABS AND TESTS: BMET    Component Value Date/Time   NA 140 03/29/2017 0815   K 4.7 03/29/2017 0815   CL 99 03/29/2017 0815   CO2 23 03/29/2017 0815   GLUCOSE 97 03/29/2017 0815   GLUCOSE 90 12/21/2016 1654   BUN 12 03/29/2017 0815   CREATININE 0.81 03/29/2017 0815   CALCIUM 9.9 03/29/2017 0815   GFRNONAA 77 03/29/2017 0815   GFRAA 89 03/29/2017 0815   Lab Results  Component Value Date   HGBA1C 5.6 03/29/2017   HGBA1C 5.7 12/21/2016   Lab Results  Component Value Date   INSULIN 34.5 (H) 03/29/2017   INSULIN 42.5 (H) 12/24/2016   CBC    Component Value Date/Time   WBC 5.4 12/21/2016 1654   RBC 4.56 12/21/2016 1654   HGB 14.0 12/21/2016 1654   HCT 41.2 12/21/2016 1654   PLT 307.0 12/21/2016 1654   MCV 90.4 12/21/2016 1654   MCHC 33.9 12/21/2016 1654     RDW 13.1 12/21/2016 1654   LYMPHSABS 1.4 12/21/2016 1654   MONOABS 0.5 12/21/2016 1654   EOSABS 0.3 12/21/2016 1654   BASOSABS 0.1 12/21/2016 1654   Iron/TIBC/Ferritin/ %Sat No results found for: IRON, TIBC, FERRITIN, IRONPCTSAT Lipid Panel     Component Value Date/Time   CHOL 254 (H) 03/29/2017 0815   TRIG 256 (H) 03/29/2017 0815   HDL 41 03/29/2017  0815   CHOLHDL 5 12/21/2016 1654   VLDL 48.0 (H) 12/21/2016 1654   LDLCALC 162 (H) 03/29/2017 0815   LDLDIRECT 146.0 12/21/2016 1654   Hepatic Function Panel     Component Value Date/Time   PROT 7.1 03/29/2017 0815   ALBUMIN 4.4 03/29/2017 0815   AST 16 03/29/2017 0815   ALT 22 03/29/2017 0815   ALKPHOS 79 03/29/2017 0815   BILITOT 0.4 03/29/2017 0815      Component Value Date/Time   TSH 4.25 12/21/2016 1654   TSH 2.86 07/16/2016 1526   TSH 5.10 (H) 05/11/2016 1108  Results for KENITA, BINES A (MRN [id number]) as of 06/23/2017 17:22  Ref. Range 03/29/2017 08:15  Vitamin D, 25-Hydroxy Latest Ref Range: 30.0 - 100.0 ng/mL 28.7 (L)    ASSESSMENT AND PLAN: Vitamin D deficiency - Plan: Vitamin D, Ergocalciferol, (DRISDOL) 50000 units CAPS capsule  Prediabetes - Plan: metFORMIN (GLUCOPHAGE) 500 MG tablet  Class 2 severe obesity with serious comorbidity and body mass index (BMI) of 38.0 to 38.9 in adult, unspecified obesity type (Destiny [name])  PLAN:  Vitamin D Deficiency Destiny [name] was informed that low vitamin D levels contributes to fatigue and are associated with obesity, breast, and colon cancer. Destiny [name] agrees to continue taking prescription Vit D @50 ,000 IU every week #4 and we will refill for 1 month. She will follow up for routine testing of vitamin D, at least 2-3 times per year. She was informed of the risk of over-replacement of vitamin D and agrees to not increase her dose unless she discusses this with [place] first. Destiny [name] agrees to follow up with our clinic in 2 to 3 weeks and we will check labs at that  time.  Pre-Diabetes Destiny [name] will continue to work on weight loss, exercise, and decreasing simple carbohydrates in her diet to help decrease the risk of diabetes. We dicussed metformin including benefits and risks. She was informed that eating too many simple carbohydrates or too many calories at one sitting increases the likelihood of GI side effects. Destiny [name] agrees to continue taking 500 mg q AM #30 and we will refill for 1 month. Destiny [name] agrees to follow up with our clinic in 2 to 3 weeks as directed to monitor her progress and we will check labs at that time.  Obesity Destiny [name] is currently in the action stage of change. As such, her goal is to continue with weight loss efforts She has agreed to follow the Category 2 plan Destiny [name] has been instructed to work up to a goal of 150 minutes of combined cardio and strengthening exercise per week for weight loss and overall health benefits. We discussed the following Behavioral Modification Strategies today: decrease eating out, work on meal planning and easy cooking plans and decrease liquid calories   Itha has agreed to follow up with our clinic in 2 to 3 weeks. She was informed of the importance of frequent follow up visits to maximize her success with intensive lifestyle modifications for her multiple health conditions.   OBESITY BEHAVIORAL INTERVENTION VISIT  Today's visit was # 10 out of 22.  Starting weight: 226 lbs Starting date: 12/24/16 Today's weight : 218 lbs  Today's date: 06/23/2017 Total lbs lost to date: 8 (Patients must lose 7 lbs in the first 6 months to continue with counseling)   ASK: We discussed the diagnosis of obesity with [place] today and [place] agreed to give [place] permission to discuss obesity behavioral modification therapy today.  ASSESS: Lachell has the diagnosis of  obesity and her BMI today is 38.63 Martena is in the action stage of change   ADVISE: Daylen was educated on the multiple health risks  of obesity as well as the benefit of weight loss to improve her health. She was advised of the need for long term treatment and the importance of lifestyle modifications.  AGREE: Multiple dietary modification options and treatment options were discussed and  Svea agreed to the above obesity treatment plan.  I, Trixie Dredge, am acting as transcriptionist for Dennard Nip, MD  I have reviewed the above documentation for accuracy and completeness, and I agree with the above. -Dennard Nip, MD

## 2017-07-12 ENCOUNTER — Ambulatory Visit (INDEPENDENT_AMBULATORY_CARE_PROVIDER_SITE_OTHER): Admitting: Family Medicine

## 2017-07-12 VITALS — BP 135/67 | HR 68 | Temp 97.9°F | Ht 63.0 in | Wt 219.0 lb

## 2017-07-12 DIAGNOSIS — Z6838 Body mass index (BMI) 38.0-38.9, adult: Secondary | ICD-10-CM

## 2017-07-12 DIAGNOSIS — R0602 Shortness of breath: Secondary | ICD-10-CM

## 2017-07-12 NOTE — Progress Notes (Signed)
Office: [phone]  /  Fax: [phone]   HPI:   Chief Complaint: OBESITY Destiny [name] is here to discuss her progress with her obesity treatment plan. She is on the Category 2 plan and is following her eating plan approximately 80 % of the time. She states she is walking for 30 minutes 1 times per week. Destiny [name] is struggling with weight loss efforts and discouraged she is mostly eating healthy but not losing weight. She admits she is not eating all her food and still eating out frequently.  Her weight is 219 lb (99.3 kg) today and has gained 1 pound since her last visit. She has lost 7 lbs since starting treatment with [place].  Shortness of Breath with Exercise Destiny [name] notes increasing shortness of breath with exercising and seems to be worsening over time with weight gain. She is still unable to do average daily activities like climbing 1 to 2 flights of stairs without getting winded. This has not changed recently. Destiny [name] denies shortness of breath at rest or orthopnea.  ALLERGIES: Allergies  Allergen Reactions  . Diclofenac Itching, Nausea Only and Other (See Comments)    Wheezing  . Hydrochlorothiazide     Wheezing. Losing teeth.  . Statins     MEDICATIONS: Current Outpatient Medications on File Prior to Visit  Medication Sig Dispense Refill  . ARMOUR THYROID 90 MG tablet Take 1 tablet daily. 90 tablet 3  . Calcium-Magnesium 500-250 MG TABS Take by mouth.    . COD LIVER OIL PO Take by mouth. Reported on 07/16/2015    . Cyanocobalamin (VITAMIN B-12) 5000 MCG TBDP Take 5,000 mcg by mouth daily.    . irbesartan (AVAPRO) 150 MG tablet Take 1 tablet (150 mg total) by mouth daily. 90 tablet 3  . metFORMIN (GLUCOPHAGE) 500 MG tablet Take 1 tablet (500 mg total) by mouth daily with breakfast. 30 tablet 0  . metoprolol succinate (TOPROL-XL) 25 MG 24 hr tablet Take 1 tablet (25 mg total) by mouth daily. 90 tablet 3  . Probiotic Product (PROBIOTIC-10) CAPS Take by mouth.    . ranitidine  (ZANTAC) 150 MG capsule Take 150 mg by mouth daily as needed for heartburn.    . RED YEAST RICE EXTRACT PO Take by mouth. Reported on 07/16/2015    . Vitamin D, Ergocalciferol, (DRISDOL) 50000 units CAPS capsule Take 1 capsule (50,000 Units total) by mouth every 7 (seven) days. 4 capsule 0  . Wheat Dextrin (BENEFIBER DRINK MIX PO) Take by mouth.     No current facility-administered medications on file prior to visit.     PAST MEDICAL HISTORY: Past Medical History:  Diagnosis Date  . Abnormal blood chemistry   . Arthropathy   . Back pain   . Breast cyst   . Cellulitis   . Chest discomfort   . Constipation   . Disturbance of skin sensation   . Edema   . Encounter for screening and preventative care 06/17/2016  . GERD (gastroesophageal reflux disease)   . Hiatal hernia with gastroesophageal reflux 06/17/2016  . High risk medication use   . Hoarseness 06/15/2016  . Hyperglycemia   . Hyperglycemia 12/21/2016  . Hyperlipidemia   . Hyperlipidemia, mixed 06/15/2016  . Hypertension   . Hypopotassemia   . Hypothyroidism   . Joint pain   . Menopause   . Obesity   . Palpitations   . Positive ANA (antinuclear antibody)   . Shoulder pain, right   . Sleep apnea   . SOB (shortness  of breath)   . Thyroid disease 12/11/2014  . Thyroid nodule   . Urinary incontinence 04/01/2017  . Vitamin D deficiency   . Vitamin D deficiency     PAST SURGICAL HISTORY: Past Surgical History:  Procedure Laterality Date  . BREAST CYST ASPIRATION Left    30 years ago   . LIPOSUCTION    . WISDOM TOOTH EXTRACTION     and all upper teeth    SOCIAL HISTORY: Social History   Tobacco Use  . Smoking status: Former Smoker    Packs/day: 1.00    Years: 27.00    Pack years: 27.00    Types: Cigarettes  . Smokeless tobacco: Never Used  Substance Use Topics  . Alcohol use: Yes    Alcohol/week: 0.0 oz  . Drug use: No    FAMILY HISTORY: Family History  Problem Relation Age of Onset  . Irregular heart beat  Mother   . Heart disease Mother   . Rheumatic fever Mother   . Hypertension Father   . Ulcers Father   . Hyperlipidemia Father   . Vision loss Brother   . Stroke Maternal Grandmother   . Diabetes Maternal Grandmother   . Hypertension Paternal Grandmother   . Ulcers Paternal Grandfather     ROS: Review of Systems  Constitutional: Negative for weight loss.  Respiratory: Positive for shortness of breath (with exercise).   Cardiovascular: Negative for orthopnea.    PHYSICAL EXAM: Blood pressure 135/67, pulse 68, temperature 97.9 F (36.6 C), temperature source Oral, height 5\' 3"  (1.6 m), weight 219 lb (99.3 kg), SpO2 98 %. Body mass index is 38.79 kg/m. Physical Exam  Constitutional: She is oriented to person, place, and time. She appears well-developed and well-nourished.  Cardiovascular: Normal rate.  Pulmonary/Chest: Effort normal.  Musculoskeletal: Normal range of motion.  Neurological: She is oriented to person, place, and time.  Skin: Skin is warm and dry.  Psychiatric: She has a normal mood and affect. Her behavior is normal.  Vitals reviewed.   RECENT LABS AND TESTS: BMET    Component Value Date/Time   NA 140 03/29/2017 0815   K 4.7 03/29/2017 0815   CL 99 03/29/2017 0815   CO2 23 03/29/2017 0815   GLUCOSE 97 03/29/2017 0815   GLUCOSE 90 12/21/2016 1654   BUN 12 03/29/2017 0815   CREATININE 0.81 03/29/2017 0815   CALCIUM 9.9 03/29/2017 0815   GFRNONAA 77 03/29/2017 0815   GFRAA 89 03/29/2017 0815   Lab Results  Component Value Date   HGBA1C 5.6 03/29/2017   HGBA1C 5.7 12/21/2016   Lab Results  Component Value Date   INSULIN 34.5 (H) 03/29/2017   INSULIN 42.5 (H) 12/24/2016   CBC    Component Value Date/Time   WBC 5.4 12/21/2016 1654   RBC 4.56 12/21/2016 1654   HGB 14.0 12/21/2016 1654   HCT 41.2 12/21/2016 1654   PLT 307.0 12/21/2016 1654   MCV 90.4 12/21/2016 1654   MCHC 33.9 12/21/2016 1654   RDW 13.1 12/21/2016 1654   LYMPHSABS 1.4  12/21/2016 1654   MONOABS 0.5 12/21/2016 1654   EOSABS 0.3 12/21/2016 1654   BASOSABS 0.1 12/21/2016 1654   Iron/TIBC/Ferritin/ %Sat No results found for: IRON, TIBC, FERRITIN, IRONPCTSAT Lipid Panel     Component Value Date/Time   CHOL 254 (H) 03/29/2017 0815   TRIG 256 (H) 03/29/2017 0815   HDL 41 03/29/2017 0815   CHOLHDL 5 12/21/2016 1654   VLDL 48.0 (H) 12/21/2016 1654  LDLCALC 162 (H) 03/29/2017 0815   LDLDIRECT 146.0 12/21/2016 1654   Hepatic Function Panel     Component Value Date/Time   PROT 7.1 03/29/2017 0815   ALBUMIN 4.4 03/29/2017 0815   AST 16 03/29/2017 0815   ALT 22 03/29/2017 0815   ALKPHOS 79 03/29/2017 0815   BILITOT 0.4 03/29/2017 0815      Component Value Date/Time   TSH 4.25 12/21/2016 1654   TSH 2.86 07/16/2016 1526   TSH 5.10 (H) 05/11/2016 1108    ASSESSMENT AND PLAN: Shortness of breath on exertion  Class 2 severe obesity with serious comorbidity and body mass index (BMI) of 38.0 to 38.9 in adult, unspecified obesity type (HCC)  PLAN:  Shortness of Breath with Exercise Clinton's shortness of breath appears to be obesity related and exercise induced. The indirect calorimeter results showed VO2 of 256 and a REE of 1779. IC today, likely exercise intolerance due to obesity. Takhia is to get back to losing weight and continue walking. She has agrees to work on weight loss and gradually increase exercise to treat her exercise induced shortness of breath. If Krisinda follows our instructions and loses weight without improvement of her shortness of breath, we will plan to refer to pulmonology. Brantley agrees to this plan.  We spent > than 50% of the 15 minute visit on the counseling as documented in the note.  Obesity Kumari is currently in the action stage of change. As such, her goal is to continue with weight loss efforts She has agreed to keep a food journal with 1200-1500 calories and 80+ grams of protein daily Kelleigh has been instructed  to work up to a goal of 150 minutes of combined cardio and strengthening exercise per week for weight loss and overall health benefits. We discussed the following Behavioral Modification Strategies today: increasing lean protein intake, decreasing simple carbohydrates  and decrease eating out   Angle has agreed to follow up with our clinic in 2 weeks. She was informed of the importance of frequent follow up visits to maximize her success with intensive lifestyle modifications for her multiple health conditions.   OBESITY BEHAVIORAL INTERVENTION VISIT  Today's visit was # 11 out of 22.  Starting weight: 226 lbs Starting date: 12/24/16 Today's weight : 219 lbs  Today's date: 07/12/2017 Total lbs lost to date: 7 (Patients must lose 7 lbs in the first 6 months to continue with counseling)   ASK: We discussed the diagnosis of obesity with [place] today and Leahmarie agreed to give [place] permission to discuss obesity behavioral modification therapy today.  ASSESS: Jolea has the diagnosis of obesity and her BMI today is 38.8 Uma is in the action stage of change   ADVISE: Saje was educated on the multiple health risks of obesity as well as the benefit of weight loss to improve her health. She was advised of the need for long term treatment and the importance of lifestyle modifications.  AGREE: Multiple dietary modification options and treatment options were discussed and  Imonie agreed to the above obesity treatment plan.  I, Trixie Dredge, am acting as transcriptionist for Dennard Nip, MD  I have reviewed the above documentation for accuracy and completeness, and I agree with the above. -Dennard Nip, MD

## 2017-07-27 ENCOUNTER — Ambulatory Visit (INDEPENDENT_AMBULATORY_CARE_PROVIDER_SITE_OTHER): Admitting: Family Medicine

## 2017-07-27 VITALS — BP 148/80 | HR 79 | Temp 98.3°F | Ht 63.0 in | Wt 213.0 lb

## 2017-07-27 DIAGNOSIS — I10 Essential (primary) hypertension: Secondary | ICD-10-CM | POA: Diagnosis not present

## 2017-07-27 DIAGNOSIS — E038 Other specified hypothyroidism: Secondary | ICD-10-CM

## 2017-07-27 DIAGNOSIS — E7849 Other hyperlipidemia: Secondary | ICD-10-CM

## 2017-07-27 DIAGNOSIS — R7303 Prediabetes: Secondary | ICD-10-CM | POA: Diagnosis not present

## 2017-07-27 DIAGNOSIS — Z6837 Body mass index (BMI) 37.0-37.9, adult: Secondary | ICD-10-CM

## 2017-07-27 DIAGNOSIS — E559 Vitamin D deficiency, unspecified: Secondary | ICD-10-CM | POA: Diagnosis not present

## 2017-07-27 MED ORDER — METFORMIN HCL 500 MG PO TABS: 500.0000 mg | ORAL_TABLET | Freq: Every day | ORAL | 0 refills | Status: DC

## 2017-07-27 MED ORDER — VITAMIN D (ERGOCALCIFEROL) 1.25 MG (50000 UNIT) PO CAPS
50000.0000 [IU] | ORAL_CAPSULE | ORAL | 0 refills | Status: DC
Start: 2017-07-27 — End: 2017-08-10

## 2017-07-27 NOTE — Progress Notes (Signed)
Office: [phone]  /  Fax: [phone]   HPI:   Chief Complaint: OBESITY Destiny [name] is here to discuss her progress with her obesity treatment plan. She is on the  keep a food journal with 1200 to 1500 calories and 80+ grams of protein daily and is following her eating plan approximately 85 to 90 % of the time. She states she is doing a lot of walking at [facility] and Destiny [name] has had lots of illness and death in her family over the last 2 to 3 weeks, but still has done well with weight loss. She is not always eating all her foods as planning ahead has become more difficult. Her weight is 213 lb (96.6 kg) today and has had a weight loss of 6 pounds over a period of 2 weeks since her last visit. She has lost 13 lbs since starting treatment with [place].  Vitamin D deficiency Destiny [name] has a diagnosis of vitamin D deficiency and is due for labs. She is currently taking vit D and is not yet at goal. Destiny [name] denies nausea, vomiting or muscle weakness.   Ref. Range 03/29/2017 08:15  Vitamin D, 25-Hydroxy Latest Ref Range: 30.0 - 100.0 ng/mL 28.7 (L)   Pre-Diabetes Destiny [name] has a diagnosis of pre-diabetes based on her elevated Hgb A1c and was informed this puts her at greater risk of developing diabetes. Destiny [name] is doing well with weight loss and diet changes. She is taking metformin currently and continues to work on diet and exercise to decrease risk of diabetes. She denies nausea, vomiting or hypoglycemia.  Hypothyroid Destiny [name] has a diagnosis of hypothyroidism. She is on armour thyroid and is followed by her PCP, Dr. [name].. She denies hot or cold intolerance or palpitations, but does still notes fatigue.  Hypertension Destiny [name] is a 67 y.o. female with hypertension. Her blood pressure is elevated today, but she didn't take her medications, as she is fasting for labs. Destiny [name] denies chest pain or headache. She is working weight loss to help control her blood  pressure with the goal of decreasing her risk of heart attack and stroke. Destiny [name] blood pressure is not currently controlled.  Hyperlipidemia Destiny [name] has hyperlipidemia and has been attempting to control her cholesterol levels with intensive lifestyle modification including a low saturated fat diet, exercise and weight loss. She denies any chest pain, claudication or myalgias. Destiny [name] is due for labs.  ALLERGIES: Allergies  Allergen Reactions  . Diclofenac Itching, Nausea Only and Other (See Comments)    Wheezing  . Hydrochlorothiazide     Wheezing. Losing teeth.  . Statins     MEDICATIONS: Current Outpatient Medications on File Prior to Visit  Medication Sig Dispense Refill  . ARMOUR THYROID 90 MG tablet Take 1 tablet daily. 90 tablet 3  . COD LIVER OIL PO Take by mouth. Reported on 07/16/2015    . Cyanocobalamin (VITAMIN B-12) 5000 MCG TBDP Take 5,000 mcg by mouth every 30 (thirty) days.     . irbesartan (AVAPRO) 150 MG tablet Take 1 tablet (150 mg total) by mouth daily. 90 tablet 3  . metFORMIN (GLUCOPHAGE) 500 MG tablet Take 1 tablet (500 mg total) by mouth daily with breakfast. 30 tablet 0  . metoprolol succinate (TOPROL-XL) 25 MG 24 hr tablet Take 1 tablet (25 mg total) by mouth daily. 90 tablet 3  . Probiotic Product (PROBIOTIC-10) CAPS Take by mouth.    . ranitidine (ZANTAC) 150 MG capsule Take 150 mg by mouth  daily as needed for heartburn.    . RED YEAST RICE EXTRACT PO Take by mouth. Reported on 07/16/2015    . Vitamin D, Ergocalciferol, (DRISDOL) 50000 units CAPS capsule Take 1 capsule (50,000 Units total) by mouth every 7 (seven) days. 4 capsule 0  . Wheat Dextrin (BENEFIBER DRINK MIX PO) Take by mouth.     No current facility-administered medications on file prior to visit.     PAST MEDICAL HISTORY: Past Medical History:  Diagnosis Date  . Abnormal blood chemistry   . Arthropathy   . Back pain   . Breast cyst   . Cellulitis   . Chest discomfort   . Constipation     . Disturbance of skin sensation   . Edema   . Encounter for screening and preventative care 06/17/2016  . GERD (gastroesophageal reflux disease)   . Hiatal hernia with gastroesophageal reflux 06/17/2016  . High risk medication use   . Hoarseness 06/15/2016  . Hyperglycemia   . Hyperglycemia 12/21/2016  . Hyperlipidemia   . Hyperlipidemia, mixed 06/15/2016  . Hypertension   . Hypopotassemia   . Hypothyroidism   . Joint pain   . Menopause   . Obesity   . Palpitations   . Positive ANA (antinuclear antibody)   . Shoulder pain, right   . Sleep apnea   . SOB (shortness of breath)   . Thyroid disease 12/11/2014  . Thyroid nodule   . Urinary incontinence 04/01/2017  . Vitamin D deficiency   . Vitamin D deficiency     PAST SURGICAL HISTORY: Past Surgical History:  Procedure Laterality Date  . BREAST CYST ASPIRATION Left    30 years ago   . LIPOSUCTION    . WISDOM TOOTH EXTRACTION     and all upper teeth    SOCIAL HISTORY: Social History   Tobacco Use  . Smoking status: Former Smoker    Packs/day: 1.00    Years: 27.00    Pack years: 27.00    Types: Cigarettes  . Smokeless tobacco: Never Used  Substance Use Topics  . Alcohol use: Yes    Alcohol/week: 0.0 oz  . Drug use: No    FAMILY HISTORY: Family History  Problem Relation Age of Onset  . Irregular heart beat Mother   . Heart disease Mother   . Rheumatic fever Mother   . Hypertension Father   . Ulcers Father   . Hyperlipidemia Father   . Vision loss Brother   . Stroke Maternal Grandmother   . Diabetes Maternal Grandmother   . Hypertension Paternal Grandmother   . Ulcers Paternal Grandfather     ROS: Review of Systems  Constitutional: Positive for malaise/fatigue and weight loss.  Cardiovascular: Negative for chest pain, palpitations and claudication.  Gastrointestinal: Negative for nausea and vomiting.  Musculoskeletal: Negative for myalgias.       Negative for muscle weakness  Neurological: Negative for  headaches.  Endo/Heme/Allergies:       Negative for hypoglycemia Negative for Heat/Cold Intolerance    PHYSICAL EXAM: Blood pressure (!) 148/80, pulse 79, temperature 98.3 F (36.8 C), temperature source Oral, height 5\' 3"  (1.6 m), weight 213 lb (96.6 kg), SpO2 95 %. Body mass index is 37.73 kg/m. Physical Exam  Constitutional: She is oriented to person, place, and time. She appears well-developed and well-nourished.  Cardiovascular: Normal rate.  Pulmonary/Chest: Effort normal.  Musculoskeletal: Normal range of motion.  Neurological: She is oriented to person, place, and time.  Skin: Skin is warm and  dry.  Psychiatric: She has a normal mood and affect. Her behavior is normal.  Vitals reviewed.   RECENT LABS AND TESTS: BMET    Component Value Date/Time   NA 140 03/29/2017 0815   K 4.7 03/29/2017 0815   CL 99 03/29/2017 0815   CO2 23 03/29/2017 0815   GLUCOSE 97 03/29/2017 0815   GLUCOSE 90 12/21/2016 1654   BUN 12 03/29/2017 0815   CREATININE 0.81 03/29/2017 0815   CALCIUM 9.9 03/29/2017 0815   GFRNONAA 77 03/29/2017 0815   GFRAA 89 03/29/2017 0815   Lab Results  Component Value Date   HGBA1C 5.6 03/29/2017   HGBA1C 5.7 12/21/2016   Lab Results  Component Value Date   INSULIN 34.5 (H) 03/29/2017   INSULIN 42.5 (H) 12/24/2016   CBC    Component Value Date/Time   WBC 5.4 12/21/2016 1654   RBC 4.56 12/21/2016 1654   HGB 14.0 12/21/2016 1654   HCT 41.2 12/21/2016 1654   PLT 307.0 12/21/2016 1654   MCV 90.4 12/21/2016 1654   MCHC 33.9 12/21/2016 1654   RDW 13.1 12/21/2016 1654   LYMPHSABS 1.4 12/21/2016 1654   MONOABS 0.5 12/21/2016 1654   EOSABS 0.3 12/21/2016 1654   BASOSABS 0.1 12/21/2016 1654   Iron/TIBC/Ferritin/ %Sat No results found for: IRON, TIBC, FERRITIN, IRONPCTSAT Lipid Panel     Component Value Date/Time   CHOL 254 (H) 03/29/2017 0815   TRIG 256 (H) 03/29/2017 0815   HDL 41 03/29/2017 0815   CHOLHDL 5 12/21/2016 1654   VLDL 48.0 (H)  12/21/2016 1654   LDLCALC 162 (H) 03/29/2017 0815   LDLDIRECT 146.0 12/21/2016 1654   Hepatic Function Panel     Component Value Date/Time   PROT 7.1 03/29/2017 0815   ALBUMIN 4.4 03/29/2017 0815   AST 16 03/29/2017 0815   ALT 22 03/29/2017 0815   ALKPHOS 79 03/29/2017 0815   BILITOT 0.4 03/29/2017 0815      Component Value Date/Time   TSH 4.25 12/21/2016 1654   TSH 2.86 07/16/2016 1526   TSH 5.10 (H) 05/11/2016 1108     Ref. Range 03/29/2017 08:15  Vitamin D, 25-Hydroxy Latest Ref Range: 30.0 - 100.0 ng/mL 28.7 (L)   ASSESSMENT AND PLAN: Vitamin D deficiency - Plan: VITAMIN D 25 Hydroxy (Vit-D Deficiency, Fractures), Vitamin D, Ergocalciferol, (DRISDOL) 50000 units CAPS capsule  Other hyperlipidemia - Plan: Lipid Panel With LDL/HDL Ratio  Prediabetes - Plan: Comprehensive metabolic panel, Hemoglobin A1c, Insulin, random, metFORMIN (GLUCOPHAGE) 500 MG tablet  Other specified hypothyroidism - Plan: T3, T4, free, TSH  Essential hypertension  Class 2 severe obesity with serious comorbidity and body mass index (BMI) of 37.0 to 37.9 in adult, unspecified obesity type (HCC)  PLAN:  Vitamin D Deficiency Hagen was informed that low vitamin D levels contributes to fatigue and are associated with obesity, breast, and colon cancer. She agrees to continue to take prescription Vit D @50 ,000 IU every week #4 with no refills. We will check labs and will follow up for routine testing of vitamin D, at least 2-3 times per year. She was informed of the risk of over-replacement of vitamin D and agrees to not increase her dose unless she discusses this with [place] first. Maysel agrees to follow up with our clinic in 2 weeks.  Pre-Diabetes Destiny [name] will continue to work on weight loss, exercise, and decreasing simple carbohydrates in her diet to help decrease the risk of diabetes. We dicussed metformin including benefits and risks. She was informed  that eating too many simple carbohydrates or too  many calories at one sitting increases the likelihood of GI side effects. Destiny [name] requested metformin for now and a prescription was written today for 1 month refill. We will check labs and Destiny [name] agreed to follow up with [place] as directed to monitor her progress.  Hypothyroid Destiny [name] was informed of the importance of good thyroid control to help with weight loss efforts. She was also informed that supertheraputic thyroid levels are dangerous and will not improve weight loss results. We will check labs and Destiny [name] is to follow up with Dr. [name] for treatment.  Hypertension We discussed sodium restriction, working on healthy weight loss, and a regular exercise program as the means to achieve improved blood pressure control. Destiny [name] agreed with this plan and agreed to follow up as directed. We will check labs and will recheck blood pressure in 2 weeks. We will continue to monitor her blood pressure as well as her progress with the above lifestyle modifications. She will continue her medications as prescribed and will watch for signs of hypotension as she continues her lifestyle modifications.  Hyperlipidemia Destiny [name] was informed of the American Heart Association Guidelines emphasizing intensive lifestyle modifications as the first line treatment for hyperlipidemia. We discussed many lifestyle modifications today in depth, and Destiny [name] will continue to work on decreasing saturated fats such as fatty red meat, butter and many fried foods. She will also increase vegetables and lean protein in her diet and continue to work on exercise and weight loss efforts. We will check labs and Destiny [name] agrees to follow up with our clinic in 2 weeks.  Obesity Destiny [name] is currently in the action stage of change. As such, her goal is to continue with weight loss efforts She has agreed to follow the Category 2 plan Destiny [name] has been instructed to work up to a goal of 150 minutes of combined cardio and strengthening exercise per  week for weight loss and overall health benefits. We discussed the following Behavioral Modification Strategies today: no skipping meals and work on meal planning and easy cooking plans  Destiny [name] has agreed to follow up with our clinic in 2 weeks. She was informed of the importance of frequent follow up visits to maximize her success with intensive lifestyle modifications for her multiple health conditions.   OBESITY BEHAVIORAL INTERVENTION VISIT  Today's visit was # 12 out of 22.  Starting weight: 226 lbs Starting date: 12/24/16 Today's weight : 213 lbs Today's date: 07/27/2017 Total lbs lost to date: 13 (Patients must lose 7 lbs in the first 6 months to continue with counseling)   ASK: We discussed the diagnosis of obesity with [place] today and Leahna agreed to give [place] permission to discuss obesity behavioral modification therapy today.  ASSESS: Orilla has the diagnosis of obesity and her BMI today is 37.74 [place]iope is in the action stage of change   ADVISE: Joyel was educated on the multiple health risks of obesity as well as the benefit of weight loss to improve her health. She was advised of the need for long term treatment and the importance of lifestyle modifications.  AGREE: Multiple dietary modification options and treatment options were discussed and  Angel agreed to the above obesity treatment plan.  I, Doreene Nest, am acting as transcriptionist for Dennard Nip, MD  I have reviewed the above documentation for accuracy and completeness, and I agree with the above. -Dennard Nip, MD

## 2017-07-30 LAB — COMPREHENSIVE METABOLIC PANEL
ALT: 24 IU/L (ref 0–32)
AST: 20 IU/L (ref 0–40)
Albumin/Globulin Ratio: 1.8 (ref 1.2–2.2)
Albumin: 4.6 g/dL (ref 3.6–4.8)
BUN/Creatinine Ratio: 12 (ref 12–28)
BUN: 10 mg/dL (ref 8–27)
Bilirubin Total: 0.7 mg/dL (ref 0.0–1.2)
CREATININE: 0.86 mg/dL (ref 0.57–1.00)
GFR calc Af Amer: 83 mL/min/{1.73_m2} (ref >59)
GFR, EST NON AFRICAN AMERICAN: 72 mL/min/{1.73_m2} (ref >59)
GLOBULIN, TOTAL: 2.6 g/dL (ref 1.5–4.5)
Glucose: 103 mg/dL — ABNORMAL HIGH (ref 65–99)
Total Protein: 7.2 g/dL (ref 6.0–8.5)

## 2017-07-30 LAB — LIPID PANEL WITH LDL/HDL RATIO
Cholesterol, Total: 204 mg/dL — ABNORMAL HIGH (ref 100–199)
HDL: 45 mg/dL (ref >39)
LDL Calculated: 136 mg/dL — ABNORMAL HIGH (ref 0–99)
LDL/HDL RATIO: 3 ratio (ref 0.0–3.2)
TRIGLYCERIDES: 115 mg/dL (ref 0–149)
VLDL Cholesterol Cal: 23 mg/dL (ref 5–40)

## 2017-07-30 LAB — T4, FREE: Free T4: 0.81 ng/dL — ABNORMAL LOW (ref 0.82–1.77)

## 2017-07-30 LAB — HEMOGLOBIN A1C
ESTIMATED AVERAGE GLUCOSE: 108 mg/dL
Hgb A1c MFr Bld: 5.4 % (ref 4.8–5.6)

## 2017-07-30 LAB — TSH: TSH: 6.14 u[IU]/mL — ABNORMAL HIGH (ref 0.450–4.500)

## 2017-07-30 LAB — INSULIN, RANDOM: INSULIN: 17.4 u[IU]/mL (ref 2.6–24.9)

## 2017-07-30 LAB — T3: T3, Total: 188 ng/dL — ABNORMAL HIGH (ref 71–180)

## 2017-07-30 LAB — VITAMIN D 25 HYDROXY (VIT D DEFICIENCY, FRACTURES): Vit D, 25-Hydroxy: 38.1 ng/mL (ref 30.0–100.0)

## 2017-07-30 MED FILL — POLYMYXIN B/TMP EYE DROPS: 10000-0.1 | 25 days supply | Qty: 10 | Fill #0

## 2017-08-10 ENCOUNTER — Ambulatory Visit (INDEPENDENT_AMBULATORY_CARE_PROVIDER_SITE_OTHER): Admitting: Family Medicine

## 2017-08-10 VITALS — BP 133/85 | HR 67 | Temp 98.6°F | Ht 63.0 in | Wt 213.0 lb

## 2017-08-10 DIAGNOSIS — Z6837 Body mass index (BMI) 37.0-37.9, adult: Secondary | ICD-10-CM

## 2017-08-10 DIAGNOSIS — R7303 Prediabetes: Secondary | ICD-10-CM | POA: Diagnosis not present

## 2017-08-10 DIAGNOSIS — Z9189 Other specified personal risk factors, not elsewhere classified: Secondary | ICD-10-CM | POA: Diagnosis not present

## 2017-08-10 DIAGNOSIS — E559 Vitamin D deficiency, unspecified: Secondary | ICD-10-CM | POA: Diagnosis not present

## 2017-08-10 MED ORDER — VITAMIN D (ERGOCALCIFEROL) 1.25 MG (50000 UNIT) PO CAPS
50000.0000 [IU] | ORAL_CAPSULE | ORAL | 0 refills | Status: DC
Start: 2017-08-10 — End: 2017-09-16

## 2017-08-10 NOTE — Progress Notes (Signed)
Office: [phone]  /  Fax: [phone]   HPI:   Chief Complaint: OBESITY Destiny [name] is here to discuss her progress with her obesity treatment plan. She is on the Category 2 plan and is following her eating plan approximately 80 % of the time. She states she is exercising 0 minutes 0 times per week. Destiny [name] has done well maintaining weight. Not eating all of her protein and snacking a bit more.  Her weight is 213 lb (96.6 kg) today and has not lost weight since her last visit. She has lost 13 lbs since starting treatment with [place].  Vitamin D Deficiency Destiny [name] has a diagnosis of vitamin D deficiency. Level slowing improving on Vit D prescription. She denies nausea, vomiting or muscle weakness.  Pre-Diabetes Destiny [name] has a diagnosis of pre-diabetes based on her elevated Hgb A1c and was informed this puts her at greater risk of developing diabetes. Levels nicely improving with diet, exercise, and metformin. She notes decreased polyphagia and denies nausea, vomiting, or hypoglycemia.  At risk for diabetes Destiny [name] is at higher than average risk for developing diabetes due to her obesity and pre-diabetes. She currently denies polyuria or polydipsia.  ALLERGIES: Allergies  Allergen Reactions  . Diclofenac Itching, Nausea Only and Other (See Comments)    Wheezing  . Hydrochlorothiazide     Wheezing. Losing teeth.  . Statins     MEDICATIONS: Current Outpatient Medications on File Prior to Visit  Medication Sig Dispense Refill  . ARMOUR THYROID 90 MG tablet Take 1 tablet daily. 90 tablet 3  . COD LIVER OIL PO Take by mouth. Reported on 07/16/2015    . Cyanocobalamin (VITAMIN B-12) 5000 MCG TBDP Take 5,000 mcg by mouth every 30 (thirty) days.     . irbesartan (AVAPRO) 150 MG tablet Take 1 tablet (150 mg total) by mouth daily. 90 tablet 3  . metFORMIN (GLUCOPHAGE) 500 MG tablet Take 1 tablet (500 mg total) by mouth daily with breakfast. 30 tablet 0  . metoprolol succinate (TOPROL-XL) 25 MG  24 hr tablet Take 1 tablet (25 mg total) by mouth daily. 90 tablet 3  . Probiotic Product (PROBIOTIC-10) CAPS Take by mouth.    . ranitidine (ZANTAC) 150 MG capsule Take 150 mg by mouth daily as needed for heartburn.    . RED YEAST RICE EXTRACT PO Take by mouth. Reported on 07/16/2015    . Vitamin D, Ergocalciferol, (DRISDOL) 50000 units CAPS capsule Take 1 capsule (50,000 Units total) by mouth every 7 (seven) days. 4 capsule 0  . Wheat Dextrin (BENEFIBER DRINK MIX PO) Take by mouth.     No current facility-administered medications on file prior to visit.     PAST MEDICAL HISTORY: Past Medical History:  Diagnosis Date  . Abnormal blood chemistry   . Arthropathy   . Back pain   . Breast cyst   . Cellulitis   . Chest discomfort   . Constipation   . Disturbance of skin sensation   . Edema   . Encounter for screening and preventative care 06/17/2016  . GERD (gastroesophageal reflux disease)   . Hiatal hernia with gastroesophageal reflux 06/17/2016  . High risk medication use   . Hoarseness 06/15/2016  . Hyperglycemia   . Hyperglycemia 12/21/2016  . Hyperlipidemia   . Hyperlipidemia, mixed 06/15/2016  . Hypertension   . Hypopotassemia   . Hypothyroidism   . Joint pain   . Menopause   . Obesity   . Palpitations   . Positive ANA (antinuclear antibody)   .  Shoulder pain, right   . Sleep apnea   . SOB (shortness of breath)   . Thyroid disease 12/11/2014  . Thyroid nodule   . Urinary incontinence 04/01/2017  . Vitamin D deficiency   . Vitamin D deficiency     PAST SURGICAL HISTORY: Past Surgical History:  Procedure Laterality Date  . BREAST CYST ASPIRATION Left    30 years ago   . LIPOSUCTION    . WISDOM TOOTH EXTRACTION     and all upper teeth    SOCIAL HISTORY: Social History   Tobacco Use  . Smoking status: Former Smoker    Packs/day: 1.00    Years: 27.00    Pack years: 27.00    Types: Cigarettes  . Smokeless tobacco: Never Used  Substance Use Topics  . Alcohol  use: Yes    Alcohol/week: 0.0 oz  . Drug use: No    FAMILY HISTORY: Family History  Problem Relation Age of Onset  . Irregular heart beat Mother   . Heart disease Mother   . Rheumatic fever Mother   . Hypertension Father   . Ulcers Father   . Hyperlipidemia Father   . Vision loss Brother   . Stroke Maternal Grandmother   . Diabetes Maternal Grandmother   . Hypertension Paternal Grandmother   . Ulcers Paternal Grandfather     ROS: Review of Systems  Constitutional: Negative for weight loss.  Gastrointestinal: Negative for nausea and vomiting.  Genitourinary: Negative for frequency.  Musculoskeletal:       Negative muscle weakness  Endo/Heme/Allergies: Negative for polydipsia.       Negative hypoglycemia Negative polyphagia    PHYSICAL EXAM: Blood pressure 133/85, pulse 67, temperature 98.6 F (37 C), temperature source Oral, height 5\' 3"  (1.6 m), weight 213 lb (96.6 kg), SpO2 97 %. Body mass index is 37.73 kg/m. Physical Exam  Constitutional: She is oriented to person, place, and time. She appears well-developed and well-nourished.  Cardiovascular: Normal rate.  Pulmonary/Chest: Effort normal.  Musculoskeletal: Normal range of motion.  Neurological: She is oriented to person, place, and time.  Skin: Skin is warm and dry.  Psychiatric: She has a normal mood and affect. Her behavior is normal.  Vitals reviewed.   RECENT LABS AND TESTS: BMET    Component Value Date/Time   NA CANCELED 07/27/2017 0815   K CANCELED 07/27/2017 0815   CL CANCELED 07/27/2017 0815   CO2 CANCELED 07/27/2017 0815   GLUCOSE 103 (H) 07/27/2017 0815   GLUCOSE 90 12/21/2016 1654   BUN 10 07/27/2017 0815   CREATININE 0.86 07/27/2017 0815   CALCIUM CANCELED 07/27/2017 0815   GFRNONAA 72 07/27/2017 0815   GFRAA 83 07/27/2017 0815   Lab Results  Component Value Date   HGBA1C 5.4 07/27/2017   HGBA1C 5.6 03/29/2017   HGBA1C 5.7 12/21/2016   Lab Results  Component Value Date   INSULIN  17.4 07/27/2017   INSULIN 34.5 (H) 03/29/2017   INSULIN 42.5 (H) 12/24/2016   CBC    Component Value Date/Time   WBC 5.4 12/21/2016 1654   RBC 4.56 12/21/2016 1654   HGB 14.0 12/21/2016 1654   HCT 41.2 12/21/2016 1654   PLT 307.0 12/21/2016 1654   MCV 90.4 12/21/2016 1654   MCHC 33.9 12/21/2016 1654   RDW 13.1 12/21/2016 1654   LYMPHSABS 1.4 12/21/2016 1654   MONOABS 0.5 12/21/2016 1654   EOSABS 0.3 12/21/2016 1654   BASOSABS 0.1 12/21/2016 1654   Iron/TIBC/Ferritin/ %Sat No results found for: IRON,  TIBC, FERRITIN, IRONPCTSAT Lipid Panel     Component Value Date/Time   CHOL 204 (H) 07/27/2017 0815   TRIG 115 07/27/2017 0815   HDL 45 07/27/2017 0815   CHOLHDL 5 12/21/2016 1654   VLDL 48.0 (H) 12/21/2016 1654   LDLCALC 136 (H) 07/27/2017 0815   LDLDIRECT 146.0 12/21/2016 1654   Hepatic Function Panel     Component Value Date/Time   PROT 7.2 07/27/2017 0815   ALBUMIN 4.6 07/27/2017 0815   AST 20 07/27/2017 0815   ALT 24 07/27/2017 0815   ALKPHOS CANCELED 07/27/2017 0815   BILITOT 0.7 07/27/2017 0815      Component Value Date/Time   TSH 6.140 (H) 07/27/2017 0815   TSH 4.25 12/21/2016 1654   TSH 2.86 07/16/2016 1526  Results for [place]dson, Zharia A (MRN [id number]) as of 08/10/2017 15:28  Ref. Range 07/27/2017 08:15  Vitamin D, 25-Hydroxy Latest Ref Range: 30.0 - 100.0 ng/mL 38.1    ASSESSMENT AND PLAN: Vitamin D deficiency - Plan: Vitamin D, Ergocalciferol, (DRISDOL) 50000 units CAPS capsule  Prediabetes  At risk for diabetes mellitus  Class 2 severe obesity with serious comorbidity and body mass index (BMI) of 37.0 to 37.9 in adult, unspecified obesity type ([place])  PLAN:  Vitamin D Deficiency Destiny [name] was informed that low vitamin D levels contributes to fatigue and are associated with obesity, breast, and colon cancer. Destiny [name] agrees to continue taking prescription Vit D @50 ,000 IU every week #4 and we will refill for 1 month. She will follow up for routine  testing of vitamin D, at least 2-3 times per year. She was informed of the risk of over-replacement of vitamin D and agrees to not increase her dose unless she discusses this with [place] first. Destiny [name] agrees to follow up with our clinic in 2 weeks.  Pre-Diabetes Destiny [name] will continue to work on weight loss, diet, exercise, and decreasing simple carbohydrates in her diet to help decrease the risk of diabetes. We dicussed metformin including benefits and risks. She was informed that eating too many simple carbohydrates or too many calories at one sitting increases the likelihood of GI side effects. Destiny [name] agrees to continue metformin as prescribed. Destiny [name] agrees to follow up with our clinic in 2 weeks as directed to monitor her progress.  Diabetes risk counselling Destiny [name] was given extended (15 minutes) diabetes prevention counseling today. She is 67 y.o. female and has risk factors for diabetes including obesity and pre-diabetes. We discussed intensive lifestyle modifications today with an emphasis on weight loss as well as increasing exercise and decreasing simple carbohydrates in her diet.  Obesity Destiny [name] is currently in the action stage of change. As such, her goal is to continue with weight loss efforts She has agreed to follow the Category 2 plan Destiny [name] has been instructed to work up to a goal of 150 minutes of combined cardio and strengthening exercise per week for weight loss and overall health benefits. We discussed the following Behavioral Modification Strategies today: increasing lean protein intake, decrease eating out and work on meal planning and easy cooking plans Destiny [name] was informed it is ok to substitute 100 calories of greek yogurt or cottage cheese for 2 oz of lean protein.  Destiny [name] has agreed to follow up with our clinic in 2 weeks. She was informed of the importance of frequent follow up visits to maximize her success with intensive lifestyle modifications for her multiple health  conditions.   OBESITY BEHAVIORAL INTERVENTION VISIT  Today's visit was # 13 out of  22.  Starting weight: 226 lbs Starting date: 12/24/16 Today's weight : 213 lbs   Today's date: 08/10/2017 Total lbs lost to date: 13 (Patients must lose 7 lbs in the first 6 months to continue with counseling)   ASK: We discussed the diagnosis of obesity with Destiny [name] today and Destiny [name] agreed to give [place] permission to discuss obesity behavioral modification therapy today.  ASSESS: Destiny [name] has the diagnosis of obesity and her BMI today is 37.74 Destiny [name] is in the action stage of change   ADVISE: Destiny [name] was educated on the multiple health risks of obesity as well as the benefit of weight loss to improve her health. She was advised of the need for long term treatment and the importance of lifestyle modifications.  AGREE: Multiple dietary modification options and treatment options were discussed and  Destiny [name] agreed to the above obesity treatment plan.  I, Trixie Dredge, am acting as transcriptionist for Dennard Nip, MD  I have reviewed the above documentation for accuracy and completeness, and I agree with the above. -Dennard Nip, MD

## 2017-08-24 ENCOUNTER — Ambulatory Visit (INDEPENDENT_AMBULATORY_CARE_PROVIDER_SITE_OTHER): Admitting: Family Medicine

## 2017-08-24 VITALS — BP 117/76 | HR 68 | Temp 98.0°F | Ht 63.0 in | Wt 213.0 lb

## 2017-08-24 DIAGNOSIS — Z6837 Body mass index (BMI) 37.0-37.9, adult: Secondary | ICD-10-CM

## 2017-08-24 DIAGNOSIS — R7303 Prediabetes: Secondary | ICD-10-CM | POA: Diagnosis not present

## 2017-08-24 MED ORDER — METFORMIN HCL 500 MG PO TABS: 500.0000 mg | ORAL_TABLET | Freq: Every day | ORAL | 0 refills | Status: DC

## 2017-08-25 NOTE — Progress Notes (Signed)
Office: [phone]  /  Fax: [phone]   HPI:   Chief Complaint: OBESITY Destiny [name] is here to discuss her progress with her obesity treatment plan. She is on the Category 2 plan and is following her eating plan approximately 85 % of the time. She states she is walking and doing yard work for 30 minutes 2 times per week. Destiny [name] is doing well maintaining weight but is deviating from her plan more. She isn't doing as much meal planning and prepping with husband's recent illness.  Her weight is 213 lb (96.6 kg) today and has not lost weight since her last visit. She has lost 13 lbs since starting treatment with [place].  Pre-Diabetes Destiny [name] has a diagnosis of pre-diabetes based on her elevated Hgb A1c and was informed this puts her at greater risk of developing diabetes. Sheis on metformin and doing well with diet and exercise to decrease risk of diabetes. She notes decreased polyphagia and denies nausea, vomiting, or hypoglycemia.  ALLERGIES: Allergies  Allergen Reactions  . Diclofenac Itching, Nausea Only and Other (See Comments)    Wheezing  . Hydrochlorothiazide     Wheezing. Losing teeth.  . Statins     MEDICATIONS: Current Outpatient Medications on File Prior to Visit  Medication Sig Dispense Refill  . ARMOUR THYROID 90 MG tablet Take 1 tablet daily. 90 tablet 3  . COD LIVER OIL PO Take by mouth. Reported on 07/16/2015    . Cyanocobalamin (VITAMIN B-12) 5000 MCG TBDP Take 5,000 mcg by mouth every 30 (thirty) days.     . irbesartan (AVAPRO) 150 MG tablet Take 1 tablet (150 mg total) by mouth daily. 90 tablet 3  . metoprolol succinate (TOPROL-XL) 25 MG 24 hr tablet Take 1 tablet (25 mg total) by mouth daily. 90 tablet 3  . Probiotic Product (PROBIOTIC-10) CAPS Take by mouth.    . ranitidine (ZANTAC) 150 MG capsule Take 150 mg by mouth daily as needed for heartburn.    . RED YEAST RICE EXTRACT PO Take by mouth. Reported on 07/16/2015    . Vitamin D, Ergocalciferol, (DRISDOL) 50000  units CAPS capsule Take 1 capsule (50,000 Units total) by mouth every 7 (seven) days. 4 capsule 0  . Wheat Dextrin (BENEFIBER DRINK MIX PO) Take by mouth.     No current facility-administered medications on file prior to visit.     PAST MEDICAL HISTORY: Past Medical History:  Diagnosis Date  . Abnormal blood chemistry   . Arthropathy   . Back pain   . Breast cyst   . Cellulitis   . Chest discomfort   . Constipation   . Disturbance of skin sensation   . Edema   . Encounter for screening and preventative care 06/17/2016  . GERD (gastroesophageal reflux disease)   . Hiatal hernia with gastroesophageal reflux 06/17/2016  . High risk medication use   . Hoarseness 06/15/2016  . Hyperglycemia   . Hyperglycemia 12/21/2016  . Hyperlipidemia   . Hyperlipidemia, mixed 06/15/2016  . Hypertension   . Hypopotassemia   . Hypothyroidism   . Joint pain   . Menopause   . Obesity   . Palpitations   . Positive ANA (antinuclear antibody)   . Shoulder pain, right   . Sleep apnea   . SOB (shortness of breath)   . Thyroid disease 12/11/2014  . Thyroid nodule   . Urinary incontinence 04/01/2017  . Vitamin D deficiency   . Vitamin D deficiency     PAST SURGICAL HISTORY: Past  Surgical History:  Procedure Laterality Date  . BREAST CYST ASPIRATION Left    30 years ago   . LIPOSUCTION    . WISDOM TOOTH EXTRACTION     and all upper teeth    SOCIAL HISTORY: Social History   Tobacco Use  . Smoking status: Former Smoker    Packs/day: 1.00    Years: 27.00    Pack years: 27.00    Types: Cigarettes  . Smokeless tobacco: Never Used  Substance Use Topics  . Alcohol use: Yes    Alcohol/week: 0.0 oz  . Drug use: No    FAMILY HISTORY: Family History  Problem Relation Age of Onset  . Irregular heart beat Mother   . Heart disease Mother   . Rheumatic fever Mother   . Hypertension Father   . Ulcers Father   . Hyperlipidemia Father   . Vision loss Brother   . Stroke Maternal Grandmother     . Diabetes Maternal Grandmother   . Hypertension Paternal Grandmother   . Ulcers Paternal Grandfather     ROS: Review of Systems  Constitutional: Negative for weight loss.  Gastrointestinal: Negative for nausea and vomiting.  Endo/Heme/Allergies:       Negative hypoglycemia Positive polyphagia    PHYSICAL EXAM: Blood pressure 117/76, pulse 68, temperature 98 F (36.7 C), temperature source Oral, height 5\' 3"  (1.6 m), weight 213 lb (96.6 kg), SpO2 97 %. Body mass index is 37.73 kg/m. Physical Exam  Constitutional: She is oriented to person, place, and time. She appears well-developed and well-nourished.  Cardiovascular: Normal rate.  Pulmonary/Chest: Effort normal.  Musculoskeletal: Normal range of motion.  Neurological: She is oriented to person, place, and time.  Skin: Skin is warm and dry.  Psychiatric: She has a normal mood and affect. Her behavior is normal.  Vitals reviewed.   RECENT LABS AND TESTS: BMET    Component Value Date/Time   NA CANCELED 07/27/2017 0815   K CANCELED 07/27/2017 0815   CL CANCELED 07/27/2017 0815   CO2 CANCELED 07/27/2017 0815   GLUCOSE 103 (H) 07/27/2017 0815   GLUCOSE 90 12/21/2016 1654   BUN 10 07/27/2017 0815   CREATININE 0.86 07/27/2017 0815   CALCIUM CANCELED 07/27/2017 0815   GFRNONAA 72 07/27/2017 0815   GFRAA 83 07/27/2017 0815   Lab Results  Component Value Date   HGBA1C 5.4 07/27/2017   HGBA1C 5.6 03/29/2017   HGBA1C 5.7 12/21/2016   Lab Results  Component Value Date   INSULIN 17.4 07/27/2017   INSULIN 34.5 (H) 03/29/2017   INSULIN 42.5 (H) 12/24/2016   CBC    Component Value Date/Time   WBC 5.4 12/21/2016 1654   RBC 4.56 12/21/2016 1654   HGB 14.0 12/21/2016 1654   HCT 41.2 12/21/2016 1654   PLT 307.0 12/21/2016 1654   MCV 90.4 12/21/2016 1654   MCHC 33.9 12/21/2016 1654   RDW 13.1 12/21/2016 1654   LYMPHSABS 1.4 12/21/2016 1654   MONOABS 0.5 12/21/2016 1654   EOSABS 0.3 12/21/2016 1654   BASOSABS 0.1  12/21/2016 1654   Iron/TIBC/Ferritin/ %Sat No results found for: IRON, TIBC, FERRITIN, IRONPCTSAT Lipid Panel     Component Value Date/Time   CHOL 204 (H) 07/27/2017 0815   TRIG 115 07/27/2017 0815   HDL 45 07/27/2017 0815   CHOLHDL 5 12/21/2016 1654   VLDL 48.0 (H) 12/21/2016 1654   LDLCALC 136 (H) 07/27/2017 0815   LDLDIRECT 146.0 12/21/2016 1654   Hepatic Function Panel     Component Value  Date/Time   PROT 7.2 07/27/2017 0815   ALBUMIN 4.6 07/27/2017 0815   AST 20 07/27/2017 0815   ALT 24 07/27/2017 0815   ALKPHOS CANCELED 07/27/2017 0815   BILITOT 0.7 07/27/2017 0815      Component Value Date/Time   TSH 6.140 (H) 07/27/2017 0815   TSH 4.25 12/21/2016 1654   TSH 2.86 07/16/2016 1526    ASSESSMENT AND PLAN: Prediabetes - Plan: metFORMIN (GLUCOPHAGE) 500 MG tablet  Class 2 severe obesity with serious comorbidity and body mass index (BMI) of 37.0 to 37.9 in adult, unspecified obesity type ([place])  PLAN:  Pre-Diabetes Destiny [name] will continue to work on weight loss, exercise, and decreasing simple carbohydrates in her diet to help decrease the risk of diabetes. We dicussed metformin including benefits and risks. She was informed that eating too many simple carbohydrates or too many calories at one sitting increases the likelihood of GI side effects. Destiny [name] agrees to continue taking metformin 500 mg q AM #30 and we will refill for 1 month. Destiny [name] agrees to follow up with our clinic in 2 to 3 weeks as directed to monitor her progress.  Obesity Destiny [name] is currently in the action stage of change. As such, her goal is to continue with weight loss efforts She has agreed to follow the Category 2 plan Destiny [name] has been instructed to work up to a goal of 150 minutes of combined cardio and strengthening exercise per week for weight loss and overall health benefits. We discussed the following Behavioral Modification Strategies today: increasing lean protein intake and decreasing simple  carbohydrates    Destiny [name] has agreed to follow up with our clinic in 2 to 3 weeks. She was informed of the importance of frequent follow up visits to maximize her success with intensive lifestyle modifications for her multiple health conditions.   OBESITY BEHAVIORAL INTERVENTION VISIT  Today's visit was # 14 out of 22.  Starting weight: 226 lbs Starting date: 12/24/16 Today's weight : 213 lbs Today's date: 08/24/2017 Total lbs lost to date: 13 (Patients must lose 7 lbs in the first 6 months to continue with counseling)   ASK: We discussed the diagnosis of obesity with Destiny [name] today and Destiny [name] agreed to give [place] permission to discuss obesity behavioral modification therapy today.  ASSESS: Destiny [name] has the diagnosis of obesity and her BMI today is 37.74 Destiny [name] is in the action stage of change   ADVISE: Destiny [name] was educated on the multiple health risks of obesity as well as the benefit of weight loss to improve her health. She was advised of the need for long term treatment and the importance of lifestyle modifications.  AGREE: Multiple dietary modification options and treatment options were discussed and  Destiny [name] agreed to the above obesity treatment plan.  I, Trixie Dredge, am acting as transcriptionist for Dennard Nip, MD  I have reviewed the above documentation for accuracy and completeness, and I agree with the above. -Dennard Nip, MD

## 2017-09-16 ENCOUNTER — Ambulatory Visit (INDEPENDENT_AMBULATORY_CARE_PROVIDER_SITE_OTHER): Payer: Medicare Other | Admitting: Family Medicine

## 2017-09-16 VITALS — BP 107/67 | HR 61 | Temp 98.3°F | Ht 63.0 in | Wt 214.0 lb

## 2017-09-16 DIAGNOSIS — E559 Vitamin D deficiency, unspecified: Secondary | ICD-10-CM

## 2017-09-16 DIAGNOSIS — Z6837 Body mass index (BMI) 37.0-37.9, adult: Secondary | ICD-10-CM | POA: Diagnosis not present

## 2017-09-16 MED ORDER — VITAMIN D (ERGOCALCIFEROL) 1.25 MG (50000 UNIT) PO CAPS: 50000.0000 [IU] | ORAL_CAPSULE | ORAL | 0 refills | Status: DC

## 2017-09-20 NOTE — Progress Notes (Signed)
Office: [phone]  /  Fax: [phone]   HPI:   Chief Complaint: OBESITY Destiny [name] is here to discuss her progress with her obesity treatment plan. Destiny [name] is on the Category 2 plan and is following her eating plan approximately 80 % of the time. Destiny [name] states Destiny [name] is doing yard work for 30 minutes 3 times per week. Destiny [name] has done well maintaining her weight loss this year, but Destiny [name] has stalled with her progress. Destiny [name] is getting bored with her category 2 plan, but Destiny [name] doesn't want to journal. Her weight is 214 lb (97.1 kg) today and has had [name] weight gain of 1 pound over [name] period of 3 weeks since her last visit. Destiny [name] has lost 12 lbs since starting treatment with [place].  Vitamin D deficiency Destiny [name] has [name] diagnosis of vitamin D deficiency. Fatigue is slowly improving on vitamin D prescription. Destiny [name] is currently taking vit D prescription but is not yet at [place]. Destiny [name] denies nausea, vomiting or muscle weakness.   ALLERGIES: Allergies  Allergen Reactions  . Diclofenac Itching, Nausea Only and Other (See Comments)    Wheezing  . Hydrochlorothiazide     Wheezing. Losing teeth.  . Statins     MEDICATIONS: Current Outpatient Medications on File Prior to Visit  Medication Sig Dispense Refill  . ARMOUR THYROID 90 MG tablet Take 1 tablet daily. 90 tablet 3  . COD LIVER OIL PO Take by mouth. Reported on 07/16/2015    . Cyanocobalamin (VITAMIN B-12) 5000 MCG TBDP Take 5,000 mcg by mouth every 30 (thirty) days.     . irbesartan (AVAPRO) 150 MG tablet Take 1 tablet (150 mg total) by mouth daily. 90 tablet 3  . metFORMIN (GLUCOPHAGE) 500 MG tablet Take 1 tablet (500 mg total) by mouth daily with breakfast. 30 tablet 0  . metoprolol succinate (TOPROL-XL) 25 MG 24 hr tablet Take 1 tablet (25 mg total) by mouth daily. 90 tablet 3  . Probiotic Product (PROBIOTIC-10) CAPS Take by mouth.    . ranitidine (ZANTAC) 150 MG capsule Take 150 mg by mouth daily as needed for heartburn.    . RED YEAST RICE EXTRACT PO  Take by mouth. Reported on 07/16/2015    . Wheat Dextrin (BENEFIBER DRINK MIX PO) Take by mouth.     No current facility-administered medications on file prior to visit.     PAST MEDICAL HISTORY: Past Medical History:  Diagnosis Date  . Abnormal blood chemistry   . Arthropathy   . Back pain   . Breast cyst   . Cellulitis   . Chest discomfort   . Constipation   . Disturbance of skin sensation   . Edema   . Encounter for screening and preventative care 06/17/2016  . GERD (gastroesophageal reflux disease)   . Hiatal hernia with gastroesophageal reflux 06/17/2016  . High risk medication use   . Hoarseness 06/15/2016  . Hyperglycemia   . Hyperglycemia 12/21/2016  . Hyperlipidemia   . Hyperlipidemia, mixed 06/15/2016  . Hypertension   . Hypopotassemia   . Hypothyroidism   . Joint pain   . Menopause   . Obesity   . Palpitations   . Positive ANA (antinuclear antibody)   . Shoulder pain, right   . Sleep apnea   . SOB (shortness of breath)   . Thyroid disease 12/11/2014  . Thyroid nodule   . Urinary incontinence 04/01/2017  . Vitamin D deficiency   . Vitamin D deficiency     PAST SURGICAL HISTORY: Past Surgical  History:  Procedure Laterality Date  . BREAST CYST ASPIRATION Left    30 years ago   . LIPOSUCTION    . WISDOM TOOTH EXTRACTION     and all upper teeth    SOCIAL HISTORY: Social History   Tobacco Use  . Smoking status: Former Smoker    Packs/day: 1.00    Years: 27.00    Pack years: 27.00    Types: Cigarettes  . Smokeless tobacco: Never Used  Substance Use Topics  . Alcohol use: Yes    Alcohol/week: 0.0 oz  . Drug use: No    FAMILY HISTORY: Family History  Problem Relation Age of Onset  . Irregular heart beat Mother   . Heart disease Mother   . Rheumatic fever Mother   . Hypertension Father   . Ulcers Father   . Hyperlipidemia Father   . Vision loss Brother   . Stroke Maternal Grandmother   . Diabetes Maternal Grandmother   . Hypertension Paternal  Grandmother   . Ulcers Paternal Grandfather     ROS: Review of Systems  Constitutional: Positive for malaise/fatigue. Negative for weight loss.  Gastrointestinal: Negative for nausea and vomiting.  Musculoskeletal:       Negative for muscle weakness    PHYSICAL EXAM: Blood pressure 107/67, pulse 61, temperature 98.3 F (36.8 C), temperature source Oral, height 5\' 3"  (1.6 m), weight 214 lb (97.1 kg), SpO2 97 %. Body mass index is 37.91 kg/m. Physical Exam  Constitutional: Destiny [name] is oriented to person, place, and time. Destiny [name] appears well-developed and well-nourished.  Cardiovascular: Normal rate.  Pulmonary/Chest: Effort normal.  Musculoskeletal: Normal range of motion.  Neurological: Destiny [name] is oriented to person, place, and time.  Skin: Skin is warm and dry.  Psychiatric: Destiny [name] has [name] normal mood and affect. Her behavior is normal.  Vitals reviewed.   RECENT LABS AND TESTS: BMET    Component Value Date/Time   NA CANCELED 07/27/2017 0815   K CANCELED 07/27/2017 0815   CL CANCELED 07/27/2017 0815   CO2 CANCELED 07/27/2017 0815   GLUCOSE 103 (H) 07/27/2017 0815   GLUCOSE 90 12/21/2016 1654   BUN 10 07/27/2017 0815   CREATININE 0.86 07/27/2017 0815   CALCIUM CANCELED 07/27/2017 0815   GFRNONAA 72 07/27/2017 0815   GFRAA 83 07/27/2017 0815   Lab Results  Component Value Date   HGBA1C 5.4 07/27/2017   HGBA1C 5.6 03/29/2017   HGBA1C 5.7 12/21/2016   Lab Results  Component Value Date   INSULIN 17.4 07/27/2017   INSULIN 34.5 (H) 03/29/2017   INSULIN 42.5 (H) 12/24/2016   CBC    Component Value Date/Time   WBC 5.4 12/21/2016 1654   RBC 4.56 12/21/2016 1654   HGB 14.0 12/21/2016 1654   HCT 41.2 12/21/2016 1654   PLT 307.0 12/21/2016 1654   MCV 90.4 12/21/2016 1654   MCHC 33.9 12/21/2016 1654   RDW 13.1 12/21/2016 1654   LYMPHSABS 1.4 12/21/2016 1654   MONOABS 0.5 12/21/2016 1654   EOSABS 0.3 12/21/2016 1654   BASOSABS 0.1 12/21/2016 1654   Iron/TIBC/Ferritin/ %Sat No  results found for: IRON, TIBC, FERRITIN, IRONPCTSAT Lipid Panel     Component Value Date/Time   CHOL 204 (H) 07/27/2017 0815   TRIG 115 07/27/2017 0815   HDL 45 07/27/2017 0815   CHOLHDL 5 12/21/2016 1654   VLDL 48.0 (H) 12/21/2016 1654   LDLCALC 136 (H) 07/27/2017 0815   LDLDIRECT 146.0 12/21/2016 1654   Hepatic Function Panel     Component  Value Date/Time   PROT 7.2 07/27/2017 0815   ALBUMIN 4.6 07/27/2017 0815   AST 20 07/27/2017 0815   ALT 24 07/27/2017 0815   ALKPHOS CANCELED 07/27/2017 0815   BILITOT 0.7 07/27/2017 0815      Component Value Date/Time   TSH 6.140 (H) 07/27/2017 0815   TSH 4.25 12/21/2016 1654   TSH 2.86 07/16/2016 1526   Results for Destiny [name], Destiny [name] (MRN [id number]) as of 09/20/2017 11:10  Ref. Range 07/27/2017 08:15  Vitamin D, 25-Hydroxy Latest Ref Range: 30.0 - 100.0 ng/mL 38.1   ASSESSMENT AND PLAN: Vitamin D deficiency - Plan: Vitamin D, Ergocalciferol, (DRISDOL) 50000 units CAPS capsule  Class 2 severe obesity with serious comorbidity and body mass index (BMI) of 37.0 to 37.9 in adult, unspecified obesity type (Destiny [name])  PLAN:  Vitamin D Deficiency Destiny [name] was informed that low vitamin D levels contributes to fatigue and are associated with obesity, breast, and colon cancer. Destiny [name] agrees to continue to take prescription Vit D @50 ,000 IU every week #4 with no refills and will follow up for routine testing of vitamin D, at least 2-3 times per year. Destiny [name] was informed of the risk of over-replacement of vitamin D and agrees to not increase her dose unless Destiny [name] discusses this with [place] first. Destiny [name] agrees to follow up with our clinic in 2 to 3 weeks.  Obesity Destiny [name] is currently in the action stage of change. As such, her goal is to continue with weight loss efforts Destiny [name] has agreed to follow the [place] has been instructed to work up to [name] goal of 150 minutes of combined cardio and strengthening exercise per week for weight loss and  overall health benefits. We discussed the following Behavioral Modification Strategies today: increasing lean protein intake, work on meal planning and easy cooking plans and ways to avoid boredom eating  Nadea has agreed to follow up with our clinic in 2 to 3 weeks. Destiny [name] was informed of the importance of frequent follow up visits to maximize her success with intensive lifestyle modifications for her multiple health conditions.   OBESITY BEHAVIORAL INTERVENTION VISIT  Today's visit was # 15 out of 22.  Starting weight: 226 lbs Starting date: 12/24/16 Today's weight : 214 lbs Today's date: 09/16/2017 Total lbs lost to date: 12 (Patients must lose 7 lbs in the first 6 months to continue with counseling)   ASK: We discussed the diagnosis of obesity with Destiny [name] today and Iyanni agreed to give [place] permission to discuss obesity behavioral modification therapy today.  ASSESS: Ninetta has the diagnosis of obesity and her BMI today is 37.92 Anabelle is in the action stage of change   ADVISE: Jonessa was educated on the multiple health risks of obesity as well as the benefit of weight loss to improve her health. Destiny [name] was advised of the need for long term treatment and the importance of lifestyle modifications.  AGREE: Multiple dietary modification options and treatment options were discussed and  Halena agreed to the above obesity treatment plan.  I, Doreene Nest, am acting as transcriptionist for Dennard Nip, MD  I have reviewed the above documentation for accuracy and completeness, and I agree with the above. -Dennard Nip, MD

## 2017-09-27 DIAGNOSIS — L281 Prurigo nodularis: Secondary | ICD-10-CM | POA: Diagnosis not present

## 2017-10-07 ENCOUNTER — Ambulatory Visit (INDEPENDENT_AMBULATORY_CARE_PROVIDER_SITE_OTHER): Payer: Medicare Other | Admitting: Family Medicine

## 2017-10-07 VITALS — BP 116/73 | HR 58 | Temp 97.9°F | Ht 63.0 in | Wt 214.0 lb

## 2017-10-07 DIAGNOSIS — Z6838 Body mass index (BMI) 38.0-38.9, adult: Secondary | ICD-10-CM

## 2017-10-07 DIAGNOSIS — R7303 Prediabetes: Secondary | ICD-10-CM | POA: Diagnosis not present

## 2017-10-07 DIAGNOSIS — E559 Vitamin D deficiency, unspecified: Secondary | ICD-10-CM | POA: Diagnosis not present

## 2017-10-07 MED ORDER — METFORMIN HCL 500 MG PO TABS: 500.0000 mg | ORAL_TABLET | Freq: Every day | ORAL | 0 refills | Status: DC

## 2017-10-07 MED ORDER — VITAMIN D (ERGOCALCIFEROL) 1.25 MG (50000 UNIT) PO CAPS
50000.0000 [IU] | ORAL_CAPSULE | ORAL | 0 refills | Status: DC
Start: 2017-10-07 — End: 2017-10-27

## 2017-10-12 NOTE — Progress Notes (Signed)
Office: [phone]  /  Fax: [phone]   HPI:   Chief Complaint: OBESITY Destiny [name] is here to discuss her progress with her obesity treatment plan. She is on the  follow the Pescatarian eating plan and is following her eating plan approximately 60 % of the time. She states she is doing yard work for 30 to 60 minutes 2 times per week. Destiny [name] has done well with maintaining weight, but she is not following her plan closely and she has done multiple, different plans and struggles to follow most of them precisely. She would like to change back to the category 2 plan. Her weight is 214 lb (97.1 kg) today and has maintained weight over a period of 3 weeks since her last visit. She has lost 12 lbs since starting treatment with [place].  Vitamin D deficiency Destiny [name] has a diagnosis of vitamin D deficiency. She is stable on vit D but is not yet at goal. Destiny [name] denies nausea, vomiting or muscle weakness.  Pre-Diabetes Destiny [name] has a diagnosis of pre-diabetes based on her elevated Hgb A1c and was informed this puts her at greater risk of developing diabetes. She is  Stable on metformin, but she is struggling with her diet. Destiny [name] continues to work on diet and exercise to decrease risk of diabetes. She denies nausea or hypoglycemia.  ALLERGIES: Allergies  Allergen Reactions  . Diclofenac Itching, Nausea Only and Other (See Comments)    Wheezing  . Hydrochlorothiazide     Wheezing. Losing teeth.  . Statins     MEDICATIONS: Current Outpatient Medications on File Prior to Visit  Medication Sig Dispense Refill  . ARMOUR THYROID 90 MG tablet Take 1 tablet daily. 90 tablet 3  . COD LIVER OIL PO Take by mouth. Reported on 07/16/2015    . Cyanocobalamin (VITAMIN B-12) 5000 MCG TBDP Take 5,000 mcg by mouth every 30 (thirty) days.     . irbesartan (AVAPRO) 150 MG tablet Take 1 tablet (150 mg total) by mouth daily. 90 tablet 3  . metoprolol succinate (TOPROL-XL) 25 MG 24 hr tablet Take 1 tablet (25 mg  total) by mouth daily. 90 tablet 3  . Probiotic Product (PROBIOTIC-10) CAPS Take by mouth.    . ranitidine (ZANTAC) 150 MG capsule Take 150 mg by mouth daily as needed for heartburn.    . RED YEAST RICE EXTRACT PO Take by mouth. Reported on 07/16/2015    . Wheat Dextrin (BENEFIBER DRINK MIX PO) Take by mouth.     No current facility-administered medications on file prior to visit.     PAST MEDICAL HISTORY: Past Medical History:  Diagnosis Date  . Abnormal blood chemistry   . Arthropathy   . Back pain   . Breast cyst   . Cellulitis   . Chest discomfort   . Constipation   . Disturbance of skin sensation   . Edema   . Encounter for screening and preventative care 06/17/2016  . GERD (gastroesophageal reflux disease)   . Hiatal hernia with gastroesophageal reflux 06/17/2016  . High risk medication use   . Hoarseness 06/15/2016  . Hyperglycemia   . Hyperglycemia 12/21/2016  . Hyperlipidemia   . Hyperlipidemia, mixed 06/15/2016  . Hypertension   . Hypopotassemia   . Hypothyroidism   . Joint pain   . Menopause   . Obesity   . Palpitations   . Positive ANA (antinuclear antibody)   . Shoulder pain, right   . Sleep apnea   . SOB (shortness of breath)   .  Thyroid disease 12/11/2014  . Thyroid nodule   . Urinary incontinence 04/01/2017  . Vitamin D deficiency   . Vitamin D deficiency     PAST SURGICAL HISTORY: Past Surgical History:  Procedure Laterality Date  . BREAST CYST ASPIRATION Left    30 years ago   . LIPOSUCTION    . WISDOM TOOTH EXTRACTION     and all upper teeth    SOCIAL HISTORY: Social History   Tobacco Use  . Smoking status: Former Smoker    Packs/day: 1.00    Years: 27.00    Pack years: 27.00    Types: Cigarettes  . Smokeless tobacco: Never Used  Substance Use Topics  . Alcohol use: Yes    Alcohol/week: 0.0 oz  . Drug use: No    FAMILY HISTORY: Family History  Problem Relation Age of Onset  . Irregular heart beat Mother   . Heart disease Mother     . Rheumatic fever Mother   . Hypertension Father   . Ulcers Father   . Hyperlipidemia Father   . Vision loss Brother   . Stroke Maternal Grandmother   . Diabetes Maternal Grandmother   . Hypertension Paternal Grandmother   . Ulcers Paternal Grandfather     ROS: Review of Systems  Constitutional: Negative for weight loss.  Gastrointestinal: Negative for nausea and vomiting.  Musculoskeletal:       Negative for muscle weakness  Endo/Heme/Allergies:       Negative for hypoglycemia    PHYSICAL EXAM: Blood pressure 116/73, pulse (!) 58, temperature 97.9 F (36.6 C), temperature source Oral, height 5\' 3"  (1.6 m), weight 214 lb (97.1 kg), SpO2 98 %. Body mass index is 37.91 kg/m. Physical Exam  Constitutional: She is oriented to person, place, and time. She appears well-developed and well-nourished.  Cardiovascular: Normal rate.  Pulmonary/Chest: Effort normal.  Musculoskeletal: Normal range of motion.  Neurological: She is oriented to person, place, and time.  Skin: Skin is warm and dry.  Psychiatric: She has a normal mood and affect. Her behavior is normal.  Vitals reviewed.   RECENT LABS AND TESTS: BMET    Component Value Date/Time   NA CANCELED 07/27/2017 0815   K CANCELED 07/27/2017 0815   CL CANCELED 07/27/2017 0815   CO2 CANCELED 07/27/2017 0815   GLUCOSE 103 (H) 07/27/2017 0815   GLUCOSE 90 12/21/2016 1654   BUN 10 07/27/2017 0815   CREATININE 0.86 07/27/2017 0815   CALCIUM CANCELED 07/27/2017 0815   GFRNONAA 72 07/27/2017 0815   GFRAA 83 07/27/2017 0815   Lab Results  Component Value Date   HGBA1C 5.4 07/27/2017   HGBA1C 5.6 03/29/2017   HGBA1C 5.7 12/21/2016   Lab Results  Component Value Date   INSULIN 17.4 07/27/2017   INSULIN 34.5 (H) 03/29/2017   INSULIN 42.5 (H) 12/24/2016   CBC    Component Value Date/Time   WBC 5.4 12/21/2016 1654   RBC 4.56 12/21/2016 1654   HGB 14.0 12/21/2016 1654   HCT 41.2 12/21/2016 1654   PLT 307.0 12/21/2016  1654   MCV 90.4 12/21/2016 1654   MCHC 33.9 12/21/2016 1654   RDW 13.1 12/21/2016 1654   LYMPHSABS 1.4 12/21/2016 1654   MONOABS 0.5 12/21/2016 1654   EOSABS 0.3 12/21/2016 1654   BASOSABS 0.1 12/21/2016 1654   Iron/TIBC/Ferritin/ %Sat No results found for: IRON, TIBC, FERRITIN, IRONPCTSAT Lipid Panel     Component Value Date/Time   CHOL 204 (H) 07/27/2017 0815   TRIG 115 07/27/2017  0815   HDL 45 07/27/2017 0815   CHOLHDL 5 12/21/2016 1654   VLDL 48.0 (H) 12/21/2016 1654   LDLCALC 136 (H) 07/27/2017 0815   LDLDIRECT 146.0 12/21/2016 1654   Hepatic Function Panel     Component Value Date/Time   PROT 7.2 07/27/2017 0815   ALBUMIN 4.6 07/27/2017 0815   AST 20 07/27/2017 0815   ALT 24 07/27/2017 0815   ALKPHOS CANCELED 07/27/2017 0815   BILITOT 0.7 07/27/2017 0815      Component Value Date/Time   TSH 6.140 (H) 07/27/2017 0815   TSH 4.25 12/21/2016 1654   TSH 2.86 07/16/2016 1526   Results for C[place]ton, Aaryana A (MRN [id number]) as of 10/12/2017 08:59  Ref. Range 07/27/2017 08:15  Vitamin D, 25-Hydroxy Latest Ref Range: 30.0 - 100.0 ng/mL 38.1   ASSESSMENT AND PLAN: Prediabetes - Plan: metFORMIN (GLUCOPHAGE) 500 MG tablet  Vitamin D deficiency - Plan: Vitamin D, Ergocalciferol, (DRISDOL) 50000 units CAPS capsule  Class 2 severe obesity with serious comorbidity and body mass index (BMI) of 38.0 to 38.9 in adult, unspecified obesity type ([place])  PLAN:  Vitamin D Deficiency Destiny [name] was informed that low vitamin D levels contributes to fatigue and are associated with obesity, breast, and colon cancer. She agrees to continue to take prescription Vit D @50 ,000 IU every week #4 with no refills and will follow up for routine testing of vitamin D, at least 2-3 times per year. She was informed of the risk of over-replacement of vitamin D and agrees to not increase her dose unless she discusses this with [place] first. Detra agrees to follow up with our clinic in 2 to 3  weeks.  Pre-Diabetes Destiny [name] will continue to work on weight loss, exercise, and decreasing simple carbohydrates in her diet to help decrease the risk of diabetes. We dicussed metformin including benefits and risks. She was informed that eating too many simple carbohydrates or too many calories at one sitting increases the likelihood of GI side effects. Destiny [name] requested metformin for now and a prescription was written today for 1 month refill. Destiny [name] agreed to follow up with [place] as directed to monitor her progress.  Obesity Destiny [name] is currently in the action stage of change. As such, her goal is to continue with weight loss efforts She has agreed to follow the Category 2 plan Destiny [name] has been instructed to work up to a goal of 150 minutes of combined cardio and strengthening exercise per week for weight loss and overall health benefits. We discussed the following Behavioral Modification Strategies today: increasing lean protein intake, decreasing simple carbohydrates , work on meal planning and easy cooking plans and emotional eating strategies  Destiny [name] has agreed to follow up with our clinic in 2 to 3 weeks. She was informed of the importance of frequent follow up visits to maximize her success with intensive lifestyle modifications for her multiple health conditions.   OBESITY BEHAVIORAL INTERVENTION VISIT  Today's visit was # 16 out of 22.  Starting weight: 226 lbs Starting date: 12/24/16 Today's weight : 214 lbs Today's date: 10/07/2017 Total lbs lost to date: 12 (Patients must lose 7 lbs in the first 6 months to continue with counseling)   ASK: We discussed the diagnosis of obesity with Destiny [name] today and Destiny [name] agreed to give [place] permission to discuss obesity behavioral modification therapy today.  ASSESS: Destiny [name] has the diagnosis of obesity and her BMI today is 37.92 Destiny [name] is in the action stage of change   ADVISE: Destiny [name]  was educated on the multiple health risks of  obesity as well as the benefit of weight loss to improve her health. She was advised of the need for long term treatment and the importance of lifestyle modifications.  AGREE: Multiple dietary modification options and treatment options were discussed and  Destiny [name] agreed to the above obesity treatment plan.  I, Doreene Nest, am acting as transcriptionist for Dennard Nip, MD  I have reviewed the above documentation for accuracy and completeness, and I agree with the above. -Dennard Nip, MD

## 2017-10-27 ENCOUNTER — Ambulatory Visit (INDEPENDENT_AMBULATORY_CARE_PROVIDER_SITE_OTHER): Payer: Medicare Other | Admitting: Family Medicine

## 2017-10-27 VITALS — BP 117/71 | HR 55 | Temp 97.5°F | Ht 63.0 in | Wt 213.0 lb

## 2017-10-27 DIAGNOSIS — Z6837 Body mass index (BMI) 37.0-37.9, adult: Secondary | ICD-10-CM | POA: Diagnosis not present

## 2017-10-27 DIAGNOSIS — E559 Vitamin D deficiency, unspecified: Secondary | ICD-10-CM | POA: Diagnosis not present

## 2017-10-27 DIAGNOSIS — R7303 Prediabetes: Secondary | ICD-10-CM | POA: Diagnosis not present

## 2017-10-27 DIAGNOSIS — E66812 Obesity, class 2: Secondary | ICD-10-CM

## 2017-10-27 MED ORDER — METFORMIN HCL 500 MG PO TABS: 500.0000 mg | ORAL_TABLET | Freq: Every day | ORAL | 0 refills | Status: DC

## 2017-10-27 MED ORDER — VITAMIN D (ERGOCALCIFEROL) 1.25 MG (50000 UNIT) PO CAPS
50000.0000 [IU] | ORAL_CAPSULE | ORAL | 0 refills | Status: DC
Start: 2017-10-27 — End: 2017-12-30

## 2017-10-27 NOTE — Progress Notes (Signed)
Office: [phone]  /  Fax: [phone]   HPI:   Chief Complaint: OBESITY Destiny [name] is here to discuss her progress with her obesity treatment plan. She is on the Category 2 plan and is following her eating plan approximately 75-80 % of the time. She states she is going to gym and doing yard work for 60 minutes 3 times per week. TRUE continues to do well with weight loss, she is working on increasing lean protein and meal planning and prepping.  Her weight is 213 lb (96.6 kg) today and has had a weight loss of 1 pounds over a period of 3 weeks since her last visit. She has lost 15 lbs since starting treatment with [place].  Pre-Diabetes Destiny [name] has a diagnosis of pre-diabetes based on her elevated Hgb A1c and was informed this puts her at greater risk of developing diabetes. She is stable on metformin, working in increasing lean protein and decreasing simple carbohydrates, and continues to work on diet and exercise to decrease risk of diabetes. She denies nausea or hypoglycemia.  Vitamin D Deficiency July has a diagnosis of vitamin D deficiency. She is stable on prescription Vit D. She notes fatigue improving and denies nausea, vomiting or muscle weakness.  ALLERGIES: Allergies  Allergen Reactions  . Diclofenac Itching, Nausea Only and Other (See Comments)    Wheezing  . Hydrochlorothiazide     Wheezing. Losing teeth.  . Statins     MEDICATIONS: Current Outpatient Medications on File Prior to Visit  Medication Sig Dispense Refill  . ARMOUR THYROID 90 MG tablet Take 1 tablet daily. 90 tablet 3  . COD LIVER OIL PO Take by mouth. Reported on 07/16/2015    . Cyanocobalamin (VITAMIN B-12) 5000 MCG TBDP Take 5,000 mcg by mouth every 30 (thirty) days.     . irbesartan (AVAPRO) 150 MG tablet Take 1 tablet (150 mg total) by mouth daily. 90 tablet 3  . metFORMIN (GLUCOPHAGE) 500 MG tablet Take 1 tablet (500 mg total) by mouth daily with breakfast. 30 tablet 0  . metoprolol succinate  (TOPROL-XL) 25 MG 24 hr tablet Take 1 tablet (25 mg total) by mouth daily. 90 tablet 3  . Probiotic Product (PROBIOTIC-10) CAPS Take by mouth.    . ranitidine (ZANTAC) 150 MG capsule Take 150 mg by mouth daily as needed for heartburn.    . RED YEAST RICE EXTRACT PO Take by mouth. Reported on 07/16/2015    . Vitamin D, Ergocalciferol, (DRISDOL) 50000 units CAPS capsule Take 1 capsule (50,000 Units total) by mouth every 7 (seven) days. 4 capsule 0  . Wheat Dextrin (BENEFIBER DRINK MIX PO) Take by mouth.     No current facility-administered medications on file prior to visit.     PAST MEDICAL HISTORY: Past Medical History:  Diagnosis Date  . Abnormal blood chemistry   . Arthropathy   . Back pain   . Breast cyst   . Cellulitis   . Chest discomfort   . Constipation   . Disturbance of skin sensation   . Edema   . Encounter for screening and preventative care 06/17/2016  . GERD (gastroesophageal reflux disease)   . Hiatal hernia with gastroesophageal reflux 06/17/2016  . High risk medication use   . Hoarseness 06/15/2016  . Hyperglycemia   . Hyperglycemia 12/21/2016  . Hyperlipidemia   . Hyperlipidemia, mixed 06/15/2016  . Hypertension   . Hypopotassemia   . Hypothyroidism   . Joint pain   . Menopause   . Obesity   .  Palpitations   . Positive ANA (antinuclear antibody)   . Shoulder pain, right   . Sleep apnea   . SOB (shortness of breath)   . Thyroid disease 12/11/2014  . Thyroid nodule   . Urinary incontinence 04/01/2017  . Vitamin D deficiency   . Vitamin D deficiency     PAST SURGICAL HISTORY: Past Surgical History:  Procedure Laterality Date  . BREAST CYST ASPIRATION Left    30 years ago   . LIPOSUCTION    . WISDOM TOOTH EXTRACTION     and all upper teeth    SOCIAL HISTORY: Social History   Tobacco Use  . Smoking status: Former Smoker    Packs/day: 1.00    Years: 27.00    Pack years: 27.00    Types: Cigarettes  . Smokeless tobacco: Never Used  Substance Use  Topics  . Alcohol use: Yes    Alcohol/week: 0.0 oz  . Drug use: No    FAMILY HISTORY: Family History  Problem Relation Age of Onset  . Irregular heart beat Mother   . Heart disease Mother   . Rheumatic fever Mother   . Hypertension Father   . Ulcers Father   . Hyperlipidemia Father   . Vision loss Brother   . Stroke Maternal Grandmother   . Diabetes Maternal Grandmother   . Hypertension Paternal Grandmother   . Ulcers Paternal Grandfather     ROS: Review of Systems  Constitutional: Positive for malaise/fatigue and weight loss.  Gastrointestinal: Negative for nausea and vomiting.  Musculoskeletal:       Negative muscle weakness  Endo/Heme/Allergies:       Negative hypoglycemia    PHYSICAL EXAM: Blood pressure 117/71, pulse (!) 55, temperature (!) 97.5 F (36.4 C), temperature source Oral, height 5\' 3"  (1.6 m), weight 213 lb (96.6 kg), SpO2 98 %. Body mass index is 37.73 kg/m. Physical Exam  Constitutional: She is oriented to person, place, and time. She appears well-developed and well-nourished.  Cardiovascular: Normal rate.  Pulmonary/Chest: Effort normal.  Musculoskeletal: Normal range of motion.  Neurological: She is oriented to person, place, and time.  Skin: Skin is warm and dry.  Psychiatric: She has a normal mood and affect. Her behavior is normal.  Vitals reviewed.   RECENT LABS AND TESTS: BMET    Component Value Date/Time   NA CANCELED 07/27/2017 0815   K CANCELED 07/27/2017 0815   CL CANCELED 07/27/2017 0815   CO2 CANCELED 07/27/2017 0815   GLUCOSE 103 (H) 07/27/2017 0815   GLUCOSE 90 12/21/2016 1654   BUN 10 07/27/2017 0815   CREATININE 0.86 07/27/2017 0815   CALCIUM CANCELED 07/27/2017 0815   GFRNONAA 72 07/27/2017 0815   GFRAA 83 07/27/2017 0815   Lab Results  Component Value Date   HGBA1C 5.4 07/27/2017   HGBA1C 5.6 03/29/2017   HGBA1C 5.7 12/21/2016   Lab Results  Component Value Date   INSULIN 17.4 07/27/2017   INSULIN 34.5 (H)  03/29/2017   INSULIN 42.5 (H) 12/24/2016   CBC    Component Value Date/Time   WBC 5.4 12/21/2016 1654   RBC 4.56 12/21/2016 1654   HGB 14.0 12/21/2016 1654   HCT 41.2 12/21/2016 1654   PLT 307.0 12/21/2016 1654   MCV 90.4 12/21/2016 1654   MCHC 33.9 12/21/2016 1654   RDW 13.1 12/21/2016 1654   LYMPHSABS 1.4 12/21/2016 1654   MONOABS 0.5 12/21/2016 1654   EOSABS 0.3 12/21/2016 1654   BASOSABS 0.1 12/21/2016 1654   Iron/TIBC/Ferritin/ %Sat  No results found for: IRON, TIBC, FERRITIN, IRONPCTSAT Lipid Panel     Component Value Date/Time   CHOL 204 (H) 07/27/2017 0815   TRIG 115 07/27/2017 0815   HDL 45 07/27/2017 0815   CHOLHDL 5 12/21/2016 1654   VLDL 48.0 (H) 12/21/2016 1654   LDLCALC 136 (H) 07/27/2017 0815   LDLDIRECT 146.0 12/21/2016 1654   Hepatic Function Panel     Component Value Date/Time   PROT 7.2 07/27/2017 0815   ALBUMIN 4.6 07/27/2017 0815   AST 20 07/27/2017 0815   ALT 24 07/27/2017 0815   ALKPHOS CANCELED 07/27/2017 0815   BILITOT 0.7 07/27/2017 0815      Component Value Date/Time   TSH 6.140 (H) 07/27/2017 0815   TSH 4.25 12/21/2016 1654   TSH 2.86 07/16/2016 1526  Results for Ritchie, Birda A (MRN [id number]) as of 10/27/2017 15:41  Ref. Range 07/27/2017 08:15  Vitamin D, 25-Hydroxy Latest Ref Range: 30.0 - 100.0 ng/mL 38.1    ASSESSMENT AND PLAN: Prediabetes - Plan: metFORMIN (GLUCOPHAGE) 500 MG tablet  Vitamin D deficiency - Plan: Vitamin D, Ergocalciferol, (DRISDOL) 50000 units CAPS capsule  Class 2 severe obesity with serious comorbidity and body mass index (BMI) of 37.0 to 37.9 in adult, unspecified obesity type ([place])  PLAN:  Pre-Diabetes Trayonna will continue to work on weight loss, exercise, and decreasing simple carbohydrates in her diet to help decrease the risk of diabetes. We dicussed metformin including benefits and risks. She was informed that eating too many simple carbohydrates or too many calories at one sitting increases the  likelihood of GI side effects. Muranda agrees to continue taking metformin 500 mg q AM #30 and we will refill for 1 month. Ellar agrees to follow up with our clinic in 3 weeks as directed to monitor her progress.  Vitamin D Deficiency Nora was informed that low vitamin D levels contributes to fatigue and are associated with obesity, breast, and colon cancer. Suman agrees to continue taking prescription Vit D @50 ,000 IU every week #4 and we will refill for 1 month. She will follow up for routine testing of vitamin D, at least 2-3 times per year. She was informed of the risk of over-replacement of vitamin D and agrees to not increase her dose unless she discusses this with [place] first. Shalayne agrees to follow up our clinic in 3 weeks.  Obesity Belia is currently in the action stage of change. As such, her goal is to continue with weight loss efforts She has agreed to follow the Category 2 plan [place] has been instructed to work up to a goal of 150 minutes of combined cardio and strengthening exercise per week for weight loss and overall health benefits. We discussed the following Behavioral Modification Strategies today: increasing lean protein intake, dealing with family or coworker sabotage, and increase H20 intake   Chayna has agreed to follow up with our clinic in 3 weeks. She was informed of the importance of frequent follow up visits to maximize her success with intensive lifestyle modifications for her multiple health conditions.   OBESITY BEHAVIORAL INTERVENTION VISIT  Today's visit was # 17 out of 22.  Starting weight: 228 lbs Starting date: 12/24/16 Today's weight : 213 lbs  Today's date: 10/27/2017 Total lbs lost to date: 15 (Patients must lose 7 lbs in the first 6 months to continue with counseling)   ASK: We discussed the diagnosis of obesity with [place] today and Agueda agreed to give [place] permission to discuss obesity  behavioral modification therapy  today.  ASSESS: Elany has the diagnosis of obesity and her BMI today is 37.74 Marleigh is in the action stage of change   ADVISE: Orabelle was educated on the multiple health risks of obesity as well as the benefit of weight loss to improve her health. She was advised of the need for long term treatment and the importance of lifestyle modifications.  AGREE: Multiple dietary modification options and treatment options were discussed and  Shirline agreed to the above obesity treatment plan.  I, Trixie Dredge, am acting as transcriptionist for Dennard Nip, MD  I have reviewed the above documentation for accuracy and completeness, and I agree with the above. -Dennard Nip, MD

## 2017-11-17 ENCOUNTER — Ambulatory Visit (INDEPENDENT_AMBULATORY_CARE_PROVIDER_SITE_OTHER): Payer: Medicare Other | Admitting: Family Medicine

## 2017-11-17 VITALS — BP 122/76 | HR 60 | Temp 98.0°F | Ht 63.0 in | Wt 212.0 lb

## 2017-11-17 DIAGNOSIS — R7303 Prediabetes: Secondary | ICD-10-CM | POA: Diagnosis not present

## 2017-11-17 DIAGNOSIS — E038 Other specified hypothyroidism: Secondary | ICD-10-CM

## 2017-11-17 DIAGNOSIS — E559 Vitamin D deficiency, unspecified: Secondary | ICD-10-CM

## 2017-11-17 DIAGNOSIS — Z6837 Body mass index (BMI) 37.0-37.9, adult: Secondary | ICD-10-CM

## 2017-11-17 DIAGNOSIS — R5383 Other fatigue: Secondary | ICD-10-CM

## 2017-11-17 NOTE — Progress Notes (Signed)
Office: [phone]  /  Fax: [phone]   HPI:   Chief Complaint: OBESITY Destiny [name] is here to discuss her progress with her obesity treatment plan. She is on the Category 2 plan and is following her eating plan approximately 80-85 % of the time. She states she is exercising 0 minutes 0 times per week. Destiny [name] has lost another lb but hasn't been exercising recently due to feet pain. She is ready to get back on track.  Her weight is 212 lb (96.2 kg) today and has had a weight loss of 1 pound over a period of 3 weeks since her last visit. She has lost 14 lbs since starting treatment with [place].  Pre-Diabetes Destiny [name] has a diagnosis of pre-diabetes based on her elevated Hgb A1c and was informed this puts her at greater risk of developing diabetes. She is stable on metformin and continues to work on diet and exercise to decrease risk of diabetes. She denies nausea, vomiting, or hypoglycemia.  Vitamin D Deficiency Destiny [name] has a diagnosis of vitamin D deficiency. She is stable on prescription Vit D, not yet at goal. She denies nausea, vomiting or muscle weakness.  Fatigue Destiny [name] feels her energy is lower than it should be. She notes fatigue still even with weight loss.   Hypothyroidism Destiny [name] has a diagnosis of hypothyroidism. She is on Armour thyroid. She denies hot or cold intolerance or palpitations, but does admit to ongoing fatigue.  ALLERGIES: Allergies  Allergen Reactions  . Diclofenac Itching, Nausea Only and Other (See Comments)    Wheezing  . Hydrochlorothiazide     Wheezing. Losing teeth.  . Statins     MEDICATIONS: Current Outpatient Medications on File Prior to Visit  Medication Sig Dispense Refill  . ARMOUR THYROID 90 MG tablet Take 1 tablet daily. 90 tablet 3  . COD LIVER OIL PO Take by mouth. Reported on 07/16/2015    . Cyanocobalamin (VITAMIN B-12) 5000 MCG TBDP Take 5,000 mcg by mouth every 30 (thirty) days.     . irbesartan (AVAPRO) 150 MG tablet Take 1 tablet  (150 mg total) by mouth daily. 90 tablet 3  . metFORMIN (GLUCOPHAGE) 500 MG tablet Take 1 tablet (500 mg total) by mouth daily with breakfast. 30 tablet 0  . metoprolol succinate (TOPROL-XL) 25 MG 24 hr tablet Take 1 tablet (25 mg total) by mouth daily. 90 tablet 3  . Nutritional Supplements (QUINOA KALE & HEMP PO) Take by mouth.    . Probiotic Product (PROBIOTIC-10) CAPS Take by mouth.    . ranitidine (ZANTAC) 150 MG capsule Take 150 mg by mouth daily as needed for heartburn.    . RED YEAST RICE EXTRACT PO Take by mouth. Reported on 07/16/2015    . Vitamin D, Ergocalciferol, (DRISDOL) 50000 units CAPS capsule Take 1 capsule (50,000 Units total) by mouth every 7 (seven) days. 4 capsule 0  . Wheat Dextrin (BENEFIBER DRINK MIX PO) Take by mouth.     No current facility-administered medications on file prior to visit.     PAST MEDICAL HISTORY: Past Medical History:  Diagnosis Date  . Abnormal blood chemistry   . Arthropathy   . Back pain   . Breast cyst   . Cellulitis   . Chest discomfort   . Constipation   . Disturbance of skin sensation   . Edema   . Encounter for screening and preventative care 06/17/2016  . GERD (gastroesophageal reflux disease)   . Hiatal hernia with gastroesophageal reflux 06/17/2016  . High  risk medication use   . Hoarseness 06/15/2016  . Hyperglycemia   . Hyperglycemia 12/21/2016  . Hyperlipidemia   . Hyperlipidemia, mixed 06/15/2016  . Hypertension   . Hypopotassemia   . Hypothyroidism   . Joint pain   . Menopause   . Obesity   . Palpitations   . Positive ANA (antinuclear antibody)   . Shoulder pain, right   . Sleep apnea   . SOB (shortness of breath)   . Thyroid disease 12/11/2014  . Thyroid nodule   . Urinary incontinence 04/01/2017  . Vitamin D deficiency   . Vitamin D deficiency     PAST SURGICAL HISTORY: Past Surgical History:  Procedure Laterality Date  . BREAST CYST ASPIRATION Left    30 years ago   . LIPOSUCTION    . WISDOM TOOTH  EXTRACTION     and all upper teeth    SOCIAL HISTORY: Social History   Tobacco Use  . Smoking status: Former Smoker    Packs/day: 1.00    Years: 27.00    Pack years: 27.00    Types: Cigarettes  . Smokeless tobacco: Never Used  Substance Use Topics  . Alcohol use: Yes    Alcohol/week: 0.0 oz  . Drug use: No    FAMILY HISTORY: Family History  Problem Relation Age of Onset  . Irregular heart beat Mother   . Heart disease Mother   . Rheumatic fever Mother   . Hypertension Father   . Ulcers Father   . Hyperlipidemia Father   . Vision loss Brother   . Stroke Maternal Grandmother   . Diabetes Maternal Grandmother   . Hypertension Paternal Grandmother   . Ulcers Paternal Grandfather     ROS: Review of Systems  Constitutional: Positive for malaise/fatigue and weight loss.  Cardiovascular: Negative for palpitations.       + Feet pain  Gastrointestinal: Negative for nausea and vomiting.  Musculoskeletal:       Negative muscle weakness  Endo/Heme/Allergies:       Negative hypoglycemia Negative hot/cold intolerance    PHYSICAL EXAM: Blood pressure 122/76, pulse 60, temperature 98 F (36.7 C), temperature source Oral, height 5\' 3"  (1.6 m), weight 212 lb (96.2 kg), SpO2 97 %. Body mass index is 37.55 kg/m. Physical Exam  Constitutional: She is oriented to person, place, and time. She appears well-developed and well-nourished.  Cardiovascular: Normal rate.  Pulmonary/Chest: Effort normal.  Musculoskeletal: Normal range of motion.  Neurological: She is oriented to person, place, and time.  Skin: Skin is warm and dry.  Psychiatric: She has a normal mood and affect. Her behavior is normal.  Vitals reviewed.   RECENT LABS AND TESTS: BMET    Component Value Date/Time   NA CANCELED 07/27/2017 0815   K CANCELED 07/27/2017 0815   CL CANCELED 07/27/2017 0815   CO2 CANCELED 07/27/2017 0815   GLUCOSE 103 (H) 07/27/2017 0815   GLUCOSE 90 12/21/2016 1654   BUN 10  07/27/2017 0815   CREATININE 0.86 07/27/2017 0815   CALCIUM CANCELED 07/27/2017 0815   GFRNONAA 72 07/27/2017 0815   GFRAA 83 07/27/2017 0815   Lab Results  Component Value Date   HGBA1C 5.4 07/27/2017   HGBA1C 5.6 03/29/2017   HGBA1C 5.7 12/21/2016   Lab Results  Component Value Date   INSULIN 17.4 07/27/2017   INSULIN 34.5 (H) 03/29/2017   INSULIN 42.5 (H) 12/24/2016   CBC    Component Value Date/Time   WBC 5.4 12/21/2016 1654   RBC  4.56 12/21/2016 1654   HGB 14.0 12/21/2016 1654   HCT 41.2 12/21/2016 1654   PLT 307.0 12/21/2016 1654   MCV 90.4 12/21/2016 1654   MCHC 33.9 12/21/2016 1654   RDW 13.1 12/21/2016 1654   LYMPHSABS 1.4 12/21/2016 1654   MONOABS 0.5 12/21/2016 1654   EOSABS 0.3 12/21/2016 1654   BASOSABS 0.1 12/21/2016 1654   Iron/TIBC/Ferritin/ %Sat No results found for: IRON, TIBC, FERRITIN, IRONPCTSAT Lipid Panel     Component Value Date/Time   CHOL 204 (H) 07/27/2017 0815   TRIG 115 07/27/2017 0815   HDL 45 07/27/2017 0815   CHOLHDL 5 12/21/2016 1654   VLDL 48.0 (H) 12/21/2016 1654   LDLCALC 136 (H) 07/27/2017 0815   LDLDIRECT 146.0 12/21/2016 1654   Hepatic Function Panel     Component Value Date/Time   PROT 7.2 07/27/2017 0815   ALBUMIN 4.6 07/27/2017 0815   AST 20 07/27/2017 0815   ALT 24 07/27/2017 0815   ALKPHOS CANCELED 07/27/2017 0815   BILITOT 0.7 07/27/2017 0815      Component Value Date/Time   TSH 6.140 (H) 07/27/2017 0815   TSH 4.25 12/21/2016 1654   TSH 2.86 07/16/2016 1526  Results for Cienfuegos, Ethelmae A (MRN [id number]) as of 11/17/2017 11:51  Ref. Range 07/27/2017 08:15  Vitamin D, 25-Hydroxy Latest Ref Range: 30.0 - 100.0 ng/mL 38.1    ASSESSMENT AND PLAN: Prediabetes - Plan: Comprehensive metabolic panel, Hemoglobin A1c, Insulin, random, Lipid Panel With LDL/HDL Ratio  Vitamin D deficiency - Plan: VITAMIN D 25 Hydroxy (Vit-D Deficiency, Fractures)  Other fatigue - Plan: CBC With Differential  Other specified  hypothyroidism - Plan: T3, T4, free, TSH  Class 2 severe obesity with serious comorbidity and body mass index (BMI) of 37.0 to 37.9 in adult, unspecified obesity type ([facility])  PLAN:  Pre-Diabetes Destiny [name] will continue to work on weight loss, exercise, and decreasing simple carbohydrates in her diet to help decrease the risk of diabetes. We dicussed metformin including benefits and risks. She was informed that eating too many simple carbohydrates or too many calories at one sitting increases the likelihood of GI side effects. Alani agrees to continue taking metformin, we will check labs and Destiny [name] agrees to follow up with our clinic in 3 weeks as directed to monitor her progress.  Vitamin D Deficiency Destiny [name] was informed that low vitamin D levels contributes to fatigue and are associated with obesity, breast, and colon cancer. Kambri agrees to continue taking prescription Vit D @50 ,000 IU every week and will follow up for routine testing of vitamin D, at least 2-3 times per year. She was informed of the risk of over-replacement of vitamin D and agrees to not increase her dose unless she discusses this with [place] first. We will check labs and Destiny [name] agrees to follow up with our clinic in 3 weeks.  Fatigue Destiny [name] was informed that her fatigue may be related to obesity, depression or many other causes. We will check labs, and in the meanwhile Destiny [name] has agreed to work on diet, exercise and weight loss to help with fatigue. Proper sleep hygiene was discussed including the need for 7-8 hours of quality sleep each night. Destiny [name] agrees to follow up with our clinic in 3 weeks.  Hypothyroidism Destiny [name] was informed of the importance of good thyroid control to help with weight loss efforts. She was also informed that supertheraputic thyroid levels are dangerous and will not improve weight loss results. We will check labs and Destiny [name] agrees to follow up  with our clinic in 3 weeks.  Obesity Destiny [name] is  currently in the action stage of change. As such, her goal is to continue with weight loss efforts She has agreed to follow the Category 2 plan Destiny [name] has been instructed to work up to a goal of 150 minutes of combined cardio and strengthening exercise per week for weight loss and overall health benefits. We discussed the following Behavioral Modification Strategies today: increasing lean protein intake and decreasing simple carbohydrates    Destiny [name] has agreed to follow up with our clinic in 3 weeks. She was informed of the importance of frequent follow up visits to maximize her success with intensive lifestyle modifications for her multiple health conditions.   OBESITY BEHAVIORAL INTERVENTION VISIT  Today's visit was # 18 out of 22.  Starting weight: 226 lbs Starting date: 12/24/16 Today's weight : 212 lbs Today's date: 11/17/2017 Total lbs lost to date: 55 (Patients must lose 7 lbs in the first 6 months to continue with counseling)   ASK: We discussed the diagnosis of obesity with Destiny [name] today and Destiny [name] agreed to give [place] permission to discuss obesity behavioral modification therapy today.  ASSESS: Ayerim has the diagnosis of obesity and her BMI today is 37.56 Deyna is in the action stage of change   ADVISE: Luis was educated on the multiple health risks of obesity as well as the benefit of weight loss to improve her health. She was advised of the need for long term treatment and the importance of lifestyle modifications.  AGREE: Multiple dietary modification options and treatment options were discussed and  Braylee agreed to the above obesity treatment plan.  I, Trixie Dredge, am acting as transcriptionist for Dennard Nip, MD  I have reviewed the above documentation for accuracy and completeness, and I agree with the above. -Dennard Nip, MD

## 2017-11-18 LAB — COMPREHENSIVE METABOLIC PANEL
ALBUMIN: 4.4 g/dL (ref 3.6–4.8)
ALK PHOS: 70 IU/L (ref 39–117)
ALT: 23 IU/L (ref 0–32)
AST: 17 IU/L (ref 0–40)
Albumin/Globulin Ratio: 1.8 (ref 1.2–2.2)
BUN / CREAT RATIO: 15 (ref 12–28)
BUN: 12 mg/dL (ref 8–27)
Bilirubin Total: 0.4 mg/dL (ref 0.0–1.2)
CO2: 23 mmol/L (ref 20–29)
CREATININE: 0.82 mg/dL (ref 0.57–1.00)
Calcium: 9.6 mg/dL (ref 8.7–10.3)
Chloride: 102 mmol/L (ref 96–106)
GFR calc Af Amer: 87 mL/min/{1.73_m2} (ref >59)
GFR, EST NON AFRICAN AMERICAN: 75 mL/min/{1.73_m2} (ref >59)
GLOBULIN, TOTAL: 2.4 g/dL (ref 1.5–4.5)
Glucose: 101 mg/dL — ABNORMAL HIGH (ref 65–99)
Potassium: 4.4 mmol/L (ref 3.5–5.2)
SODIUM: 140 mmol/L (ref 134–144)
Total Protein: 6.8 g/dL (ref 6.0–8.5)

## 2017-11-18 LAB — CBC WITH DIFFERENTIAL
BASOS: 1 %
Basophils Absolute: 0.1 10*3/uL (ref 0.0–0.2)
EOS (ABSOLUTE): 0.2 10*3/uL (ref 0.0–0.4)
EOS: 4 %
HEMATOCRIT: 40.2 % (ref 34.0–46.6)
HEMOGLOBIN: 13.6 g/dL (ref 11.1–15.9)
IMMATURE GRANULOCYTES: 0 %
Immature Grans (Abs): 0 10*3/uL (ref 0.0–0.1)
LYMPHS ABS: 1.2 10*3/uL (ref 0.7–3.1)
Lymphs: 34 %
MCH: 30.2 pg (ref 26.6–33.0)
MCHC: 33.8 g/dL (ref 31.5–35.7)
MCV: 89 fL (ref 79–97)
MONOCYTES: 11 %
Monocytes Absolute: 0.4 10*3/uL (ref 0.1–0.9)
Neutrophils Absolute: 1.7 10*3/uL (ref 1.4–7.0)
Neutrophils: 50 %
RBC: 4.5 x10E6/uL (ref 3.77–5.28)
RDW: 13.8 % (ref 12.3–15.4)
WBC: 3.5 10*3/uL (ref 3.4–10.8)

## 2017-11-18 LAB — LIPID PANEL WITH LDL/HDL RATIO
Cholesterol, Total: 219 mg/dL — ABNORMAL HIGH (ref 100–199)
HDL: 42 mg/dL (ref >39)
LDL CALC: 142 mg/dL — AB (ref 0–99)
LDL/HDL RATIO: 3.4 ratio — AB (ref 0.0–3.2)
TRIGLYCERIDES: 173 mg/dL — AB (ref 0–149)
VLDL Cholesterol Cal: 35 mg/dL (ref 5–40)

## 2017-11-18 LAB — T4, FREE: Free T4: 1 ng/dL (ref 0.82–1.77)

## 2017-11-18 LAB — TSH: TSH: 4.36 u[IU]/mL (ref 0.450–4.500)

## 2017-11-18 LAB — INSULIN, RANDOM: INSULIN: 27.3 u[IU]/mL — ABNORMAL HIGH (ref 2.6–24.9)

## 2017-11-18 LAB — VITAMIN D 25 HYDROXY (VIT D DEFICIENCY, FRACTURES): Vit D, 25-Hydroxy: 37.2 ng/mL (ref 30.0–100.0)

## 2017-11-18 LAB — HEMOGLOBIN A1C
Est. average glucose Bld gHb Est-mCnc: 105 mg/dL
HEMOGLOBIN A1C: 5.3 % (ref 4.8–5.6)

## 2017-11-18 LAB — T3: T3, Total: 200 ng/dL — ABNORMAL HIGH (ref 71–180)

## 2017-12-07 ENCOUNTER — Ambulatory Visit (INDEPENDENT_AMBULATORY_CARE_PROVIDER_SITE_OTHER): Payer: Medicare Other | Admitting: Family Medicine

## 2017-12-07 ENCOUNTER — Encounter (INDEPENDENT_AMBULATORY_CARE_PROVIDER_SITE_OTHER): Payer: Self-pay

## 2017-12-23 ENCOUNTER — Ambulatory Visit (INDEPENDENT_AMBULATORY_CARE_PROVIDER_SITE_OTHER): Payer: Medicare Other | Admitting: Family Medicine

## 2017-12-27 DIAGNOSIS — L281 Prurigo nodularis: Secondary | ICD-10-CM | POA: Diagnosis not present

## 2017-12-27 DIAGNOSIS — B351 Tinea unguium: Secondary | ICD-10-CM | POA: Diagnosis not present

## 2017-12-30 ENCOUNTER — Ambulatory Visit (INDEPENDENT_AMBULATORY_CARE_PROVIDER_SITE_OTHER): Payer: Medicare Other | Admitting: Family Medicine

## 2017-12-30 VITALS — BP 122/72 | HR 59 | Temp 98.3°F | Ht 63.0 in | Wt 215.0 lb

## 2017-12-30 DIAGNOSIS — E559 Vitamin D deficiency, unspecified: Secondary | ICD-10-CM

## 2017-12-30 DIAGNOSIS — Z6838 Body mass index (BMI) 38.0-38.9, adult: Secondary | ICD-10-CM

## 2017-12-30 DIAGNOSIS — R7303 Prediabetes: Secondary | ICD-10-CM | POA: Diagnosis not present

## 2017-12-30 MED ORDER — METFORMIN HCL 500 MG PO TABS: 500.0000 mg | ORAL_TABLET | Freq: Every day | ORAL | 0 refills | Status: DC

## 2017-12-30 MED ORDER — VITAMIN D (ERGOCALCIFEROL) 1.25 MG (50000 UNIT) PO CAPS: 50000.0000 [IU] | ORAL_CAPSULE | ORAL | 0 refills | Status: DC

## 2018-01-03 NOTE — Progress Notes (Signed)
Office: [phone]  /  Fax: [phone]   HPI:   Chief Complaint: OBESITY Folasade is here to discuss her progress with her obesity treatment plan. She is on the Category 2 plan and is following her eating plan approximately 85 to 90 % of the time. She states she is exercising 0 minutes 0 times per week. Enora struggled with weight loss over the last four weeks. She reports not getting in all of her protein, especially during dinner. She also reports not drinking as much water. Her weight is 215 lb (97.5 kg) today and has had a weight gain of 3 pounds over a period of 6 weeks since her last visit. She has lost 11 lbs since starting treatment with [place].  Pre-Diabetes Yannis has a diagnosis of prediabetes based on her elevated Hgb A1c and was informed this puts her at greater risk of developing diabetes. She is taking metformin currently and continues to work on diet and exercise to decrease risk of diabetes. She denies nausea, vomiting on metformin. Jaydence denies polyphagia or hypoglycemia.  Vitamin D deficiency Sherley has a diagnosis of vitamin D deficiency. She is currently taking vit D and last level was not at goal. Monda denies nausea, vomiting or muscle weakness.  ALLERGIES: Allergies  Allergen Reactions  . Diclofenac Itching, Nausea Only and Other (See Comments)    Wheezing  . Hydrochlorothiazide     Wheezing. Losing teeth.  . Statins     MEDICATIONS: Current Outpatient Medications on File Prior to Visit  Medication Sig Dispense Refill  . ARMOUR THYROID 90 MG tablet Take 1 tablet daily. 90 tablet 3  . COD LIVER OIL PO Take by mouth. Reported on 07/16/2015    . Cyanocobalamin (VITAMIN B-12) 5000 MCG TBDP Take 5,000 mcg by mouth every 30 (thirty) days.     . irbesartan (AVAPRO) 150 MG tablet Take 1 tablet (150 mg total) by mouth daily. 90 tablet 3  . itraconazole (SPORANOX) 100 MG capsule Take 200 mg by mouth 2 (two) times daily.    . metoprolol succinate (TOPROL-XL) 25  MG 24 hr tablet Take 1 tablet (25 mg total) by mouth daily. 90 tablet 3  . Nutritional Supplements (QUINOA KALE & HEMP PO) Take by mouth.    . Probiotic Product (PROBIOTIC-10) CAPS Take by mouth.    . ranitidine (ZANTAC) 150 MG capsule Take 150 mg by mouth daily as needed for heartburn.    . RED YEAST RICE EXTRACT PO Take by mouth. Reported on 07/16/2015    . Wheat Dextrin (BENEFIBER DRINK MIX PO) Take by mouth.     No current facility-administered medications on file prior to visit.     PAST MEDICAL HISTORY: Past Medical History:  Diagnosis Date  . Abnormal blood chemistry   . Arthropathy   . Back pain   . Breast cyst   . Cellulitis   . Chest discomfort   . Constipation   . Disturbance of skin sensation   . Edema   . Encounter for screening and preventative care 06/17/2016  . GERD (gastroesophageal reflux disease)   . Hiatal hernia with gastroesophageal reflux 06/17/2016  . High risk medication use   . Hoarseness 06/15/2016  . Hyperglycemia   . Hyperglycemia 12/21/2016  . Hyperlipidemia   . Hyperlipidemia, mixed 06/15/2016  . Hypertension   . Hypopotassemia   . Hypothyroidism   . Joint pain   . Menopause   . Obesity   . Palpitations   . Positive ANA (antinuclear antibody)   .  Shoulder pain, right   . Sleep apnea   . SOB (shortness of breath)   . Thyroid disease 12/11/2014  . Thyroid nodule   . Urinary incontinence 04/01/2017  . Vitamin D deficiency   . Vitamin D deficiency     PAST SURGICAL HISTORY: Past Surgical History:  Procedure Laterality Date  . BREAST CYST ASPIRATION Left    30 years ago   . LIPOSUCTION    . WISDOM TOOTH EXTRACTION     and all upper teeth    SOCIAL HISTORY: Social History   Tobacco Use  . Smoking status: Former Smoker    Packs/day: 1.00    Years: 27.00    Pack years: 27.00    Types: Cigarettes  . Smokeless tobacco: Never Used  Substance Use Topics  . Alcohol use: Yes    Alcohol/week: 0.0 oz  . Drug use: No    FAMILY  HISTORY: Family History  Problem Relation Age of Onset  . Irregular heart beat Mother   . Heart disease Mother   . Rheumatic fever Mother   . Hypertension Father   . Ulcers Father   . Hyperlipidemia Father   . Vision loss Brother   . Stroke Maternal Grandmother   . Diabetes Maternal Grandmother   . Hypertension Paternal Grandmother   . Ulcers Paternal Grandfather     ROS: Review of Systems  Constitutional: Negative for weight loss.  Gastrointestinal: Negative for diarrhea, nausea and vomiting.  Musculoskeletal:       Negative for muscle weakness  Endo/Heme/Allergies:       Negative for hypoglycemia Negative for polyphagia    PHYSICAL EXAM: Blood pressure 122/72, pulse (!) 59, temperature 98.3 F (36.8 C), temperature source Oral, height 5\' 3"  (1.6 m), weight 215 lb (97.5 kg), SpO2 97 %. Body mass index is 38.09 kg/m. Physical Exam  Constitutional: She is oriented to person, place, and time. She appears well-developed and well-nourished.  Cardiovascular: Normal rate.  Pulmonary/Chest: Effort normal.  Musculoskeletal: Normal range of motion.  Neurological: She is oriented to person, place, and time.  Skin: Skin is warm and dry.  Psychiatric: She has a normal mood and affect. Her behavior is normal.  Vitals reviewed.   RECENT LABS AND TESTS: BMET    Component Value Date/Time   NA 140 11/17/2017 0936   K 4.4 11/17/2017 0936   CL 102 11/17/2017 0936   CO2 23 11/17/2017 0936   GLUCOSE 101 (H) 11/17/2017 0936   GLUCOSE 90 12/21/2016 1654   BUN 12 11/17/2017 0936   CREATININE 0.82 11/17/2017 0936   CALCIUM 9.6 11/17/2017 0936   GFRNONAA 75 11/17/2017 0936   GFRAA 87 11/17/2017 0936   Lab Results  Component Value Date   HGBA1C 5.3 11/17/2017   HGBA1C 5.4 07/27/2017   HGBA1C 5.6 03/29/2017   HGBA1C 5.7 12/21/2016   Lab Results  Component Value Date   INSULIN 27.3 (H) 11/17/2017   INSULIN 17.4 07/27/2017   INSULIN 34.5 (H) 03/29/2017   INSULIN 42.5 (H)  12/24/2016   CBC    Component Value Date/Time   WBC 3.5 11/17/2017 0936   WBC 5.4 12/21/2016 1654   RBC 4.50 11/17/2017 0936   RBC 4.56 12/21/2016 1654   HGB 13.6 11/17/2017 0936   HCT 40.2 11/17/2017 0936   PLT 307.0 12/21/2016 1654   MCV 89 11/17/2017 0936   MCH 30.2 11/17/2017 0936   MCHC 33.8 11/17/2017 0936   MCHC 33.9 12/21/2016 1654   RDW 13.8 11/17/2017 0936  LYMPHSABS 1.2 11/17/2017 0936   MONOABS 0.5 12/21/2016 1654   EOSABS 0.2 11/17/2017 0936   BASOSABS 0.1 11/17/2017 0936   Iron/TIBC/Ferritin/ %Sat No results found for: IRON, TIBC, FERRITIN, IRONPCTSAT Lipid Panel     Component Value Date/Time   CHOL 219 (H) 11/17/2017 0936   TRIG 173 (H) 11/17/2017 0936   HDL 42 11/17/2017 0936   CHOLHDL 5 12/21/2016 1654   VLDL 48.0 (H) 12/21/2016 1654   LDLCALC 142 (H) 11/17/2017 0936   LDLDIRECT 146.0 12/21/2016 1654   Hepatic Function Panel     Component Value Date/Time   PROT 6.8 11/17/2017 0936   ALBUMIN 4.4 11/17/2017 0936   AST 17 11/17/2017 0936   ALT 23 11/17/2017 0936   ALKPHOS 70 11/17/2017 0936   BILITOT 0.4 11/17/2017 0936      Component Value Date/Time   TSH 4.360 11/17/2017 0936   TSH 6.140 (H) 07/27/2017 0815   TSH 4.25 12/21/2016 1654   Results for KATIANNE, BARRE A (MRN [id number]) as of 01/03/2018 10:57  Ref. Range 11/17/2017 09:36  Vitamin D, 25-Hydroxy Latest Ref Range: 30.0 - 100.0 ng/mL 37.2   ASSESSMENT AND PLAN: Prediabetes - Plan: metFORMIN (GLUCOPHAGE) 500 MG tablet  Vitamin D deficiency - Plan: Vitamin D, Ergocalciferol, (DRISDOL) 50000 units CAPS capsule  Class 2 severe obesity with serious comorbidity and body mass index (BMI) of 38.0 to 38.9 in adult, unspecified obesity type ([place])  PLAN:  Pre-Diabetes Keeleigh will continue to work on weight loss, exercise, and decreasing simple carbohydrates in her diet to help decrease the risk of diabetes. We dicussed metformin including benefits and risks. She was informed that eating too  many simple carbohydrates or too many calories at one sitting increases the likelihood of GI side effects. Roberta requested metformin for now and a prescription was written today for 1 month refill. Dayonna agreed to follow up with [place] as directed to monitor her progress.  Vitamin D Deficiency Jazzlene was informed that low vitamin D levels contributes to fatigue and are associated with obesity, breast, and colon cancer. She agrees to continue to take prescription Vit D @50 ,000 IU every week #4 with no refills and will follow up for routine testing of vitamin D, at least 2-3 times per year. She was informed of the risk of over-replacement of vitamin D and agrees to not increase her dose unless she discusses this with [place] first. Woodrow agrees to follow up as directed.  Obesity Aritza is currently in the action stage of change. As such, her goal is to continue with weight loss efforts She has agreed to follow the Category 2 plan Kaidence has been instructed to work up to a goal of 150 minutes of combined cardio and strengthening exercise per week for weight loss and overall health benefits. We discussed the following Behavioral Modification Strategies today: increasing lean protein intake and increase H2O intake  Zyia has agreed to follow up with our clinic in 4 weeks. She was informed of the importance of frequent follow up visits to maximize her success with intensive lifestyle modifications for her multiple health conditions.   OBESITY BEHAVIORAL INTERVENTION VISIT  Today's visit was # 19 out of 22.  Starting weight: 226 lbs Starting date: 12/24/16 Today's weight : 215 lbs  Today's date: 12/30/2017 Total lbs lost to date: 11    ASK: We discussed the diagnosis of obesity with Lorna Few A Espaillat today and Adya agreed to give [place] permission to discuss obesity behavioral modification therapy today.  ASSESS:  Kellie has the diagnosis of obesity and her BMI today is 38.09 Nikolette is in the  action stage of change   ADVISE: Danisa was educated on the multiple health risks of obesity as well as the benefit of weight loss to improve her health. She was advised of the need for long term treatment and the importance of lifestyle modifications.  AGREE: Multiple dietary modification options and treatment options were discussed and  Karyna agreed to the above obesity treatment plan.  I, Doreene Nest, am acting as transcriptionist for Dennard Nip, MD  I have reviewed the above documentation for accuracy and completeness, and I agree with the above. -Dennard Nip, MD

## 2018-01-27 ENCOUNTER — Ambulatory Visit (INDEPENDENT_AMBULATORY_CARE_PROVIDER_SITE_OTHER): Payer: Self-pay | Admitting: Family Medicine

## 2018-02-03 ENCOUNTER — Other Ambulatory Visit: Payer: Self-pay | Admitting: Family Medicine

## 2018-02-03 DIAGNOSIS — E559 Vitamin D deficiency, unspecified: Secondary | ICD-10-CM

## 2018-02-03 DIAGNOSIS — R7303 Prediabetes: Secondary | ICD-10-CM

## 2018-02-10 ENCOUNTER — Encounter

## 2018-02-10 ENCOUNTER — Ambulatory Visit (INDEPENDENT_AMBULATORY_CARE_PROVIDER_SITE_OTHER): Payer: Medicare Other | Admitting: Family Medicine

## 2018-02-10 ENCOUNTER — Encounter: Payer: Self-pay | Admitting: Family Medicine

## 2018-02-10 VITALS — BP 162/102 | HR 74 | Temp 98.2°F | Resp 18 | Ht 63.0 in | Wt 217.8 lb

## 2018-02-10 DIAGNOSIS — K5900 Constipation, unspecified: Secondary | ICD-10-CM

## 2018-02-10 DIAGNOSIS — I10 Essential (primary) hypertension: Secondary | ICD-10-CM

## 2018-02-10 DIAGNOSIS — R1013 Epigastric pain: Secondary | ICD-10-CM

## 2018-02-10 DIAGNOSIS — R1030 Lower abdominal pain, unspecified: Secondary | ICD-10-CM

## 2018-02-10 DIAGNOSIS — R7303 Prediabetes: Secondary | ICD-10-CM | POA: Diagnosis not present

## 2018-02-10 DIAGNOSIS — Z23 Encounter for immunization: Secondary | ICD-10-CM

## 2018-02-10 DIAGNOSIS — R0789 Other chest pain: Secondary | ICD-10-CM | POA: Diagnosis not present

## 2018-02-10 DIAGNOSIS — E8881 Metabolic syndrome: Secondary | ICD-10-CM

## 2018-02-10 DIAGNOSIS — E559 Vitamin D deficiency, unspecified: Secondary | ICD-10-CM

## 2018-02-10 DIAGNOSIS — E782 Mixed hyperlipidemia: Secondary | ICD-10-CM | POA: Diagnosis not present

## 2018-02-10 DIAGNOSIS — R109 Unspecified abdominal pain: Secondary | ICD-10-CM | POA: Insufficient documentation

## 2018-02-10 LAB — H. PYLORI ANTIBODY, IGG: H Pylori IgG: NEGATIVE

## 2018-02-10 MED ORDER — IRBESARTAN 150 MG PO TABS: 150.0000 mg | ORAL_TABLET | Freq: Every day | ORAL | 3 refills | Status: DC

## 2018-02-10 MED ORDER — METFORMIN HCL 500 MG PO TABS: 500.0000 mg | ORAL_TABLET | Freq: Every day | ORAL | 1 refills | Status: DC

## 2018-02-10 MED ORDER — ARMOUR THYROID 90 MG PO TABS: ORAL_TABLET | ORAL | 3 refills | Status: DC

## 2018-02-10 MED ORDER — METOPROLOL SUCCINATE ER 25 MG PO TB24: 25.0000 mg | ORAL_TABLET | Freq: Every day | ORAL | 3 refills | Status: DC

## 2018-02-10 MED ORDER — VITAMIN D (ERGOCALCIFEROL) 1.25 MG (50000 UNIT) PO CAPS: ORAL_CAPSULE | ORAL | 4 refills | Status: DC

## 2018-02-10 MED ORDER — NITROGLYCERIN 0.4 MG SL SUBL: 0.4000 mg | SUBLINGUAL_TABLET | SUBLINGUAL | 3 refills | Status: DC | PRN

## 2018-02-10 MED FILL — NITROGLYCERIN 0.4 MG TAB SL: 0.4 | 30 days supply | Qty: 25 | Fill #0

## 2018-02-10 NOTE — Assessment & Plan Note (Signed)
hgba1c acceptable, minimize simple carbs. Increase exercise as tolerated.  

## 2018-02-10 NOTE — Assessment & Plan Note (Signed)
Encouraged heart healthy diet, increase exercise, avoid trans fats, consider a krill oil cap daily 

## 2018-02-10 NOTE — Patient Instructions (Signed)

## 2018-02-10 NOTE — Assessment & Plan Note (Signed)
Well controlled, no changes to meds. Encouraged heart healthy diet such as the DASH diet and exercise as tolerated.  °

## 2018-02-10 NOTE — Assessment & Plan Note (Addendum)
Supplement and monitor, feels her fatigue is improving since she started supplementing. Has just started 50000 IU weekly

## 2018-02-10 NOTE — Progress Notes (Signed)
Subjective:  I acted as a Education administrator for Dr. [name]. [place], [place]  Patient ID: Destiny [name], female    DOB: 28-Jun-1950, 67 y.o.   MRN: [id number]  No chief complaint on file.   HPI  Patient is in today for 6 month follow up and is overall doing well.  Complaints are multiple however.  She has intermittent pain in her bilateral lower quadrants of her abdomen.  Left is worse than right.  She does note for many many years she had trouble with pain in those areas off and on and previously had been diagnosed with ovarian cysts.  No change in bowel habits bloody or tarry stool other than she had an improvement in her constipation after starting Benefiber and probiotics.  No fevers or chills.  No dysuria.  She also complains of some intermittent left-sided chest discomfort usually when she gets agitated.  No shortness of breath or nausea vomiting associated.  Patient denies any recent febrile illness.  Patient Care Team: Mosie Lukes, MD as PCP - General (Family Medicine) Philemon Kingdom, MD as Consulting Physician (Internal Medicine)   Past Medical History:  Diagnosis Date  . Abnormal blood chemistry   . Arthropathy   . Back pain   . Breast cyst   . Cellulitis   . Chest discomfort   . Constipation   . Disturbance of skin sensation   . Edema   . Encounter for screening and preventative care 06/17/2016  . GERD (gastroesophageal reflux disease)   . Hiatal hernia with gastroesophageal reflux 06/17/2016  . High risk medication use   . Hoarseness 06/15/2016  . Hyperglycemia   . Hyperglycemia 12/21/2016  . Hyperlipidemia   . Hyperlipidemia, mixed 06/15/2016  . Hypertension   . Hypopotassemia   . Hypothyroidism   . Joint pain   . Menopause   . Obesity   . Palpitations   . Positive ANA (antinuclear antibody)   . Shoulder pain, right   . Sleep apnea   . SOB (shortness of breath)   . Thyroid disease 12/11/2014  . Thyroid nodule   . Urinary incontinence 04/01/2017  . Vitamin D deficiency     . Vitamin D deficiency     Past Surgical History:  Procedure Laterality Date  . BREAST CYST ASPIRATION Left    30 years ago   . LIPOSUCTION    . WISDOM TOOTH EXTRACTION     and all upper teeth    Family History  Problem Relation Age of Onset  . Irregular heart beat Mother   . Heart disease Mother   . Rheumatic fever Mother   . Hypertension Father   . Ulcers Father   . Hyperlipidemia Father   . Vision loss Brother   . Stroke Maternal Grandmother   . Diabetes Maternal Grandmother   . Hypertension Paternal Grandmother   . Ulcers Paternal Grandfather     Social History   Socioeconomic History  . Marital status: Married    Spouse name: Zennie Ayars  . Number of children: Not on file  . Years of education: Not on file  . Highest education level: Not on file  Occupational History  . Occupation: homemaker  Social Needs  . Financial resource strain: Not on file  . Food insecurity:    Worry: Not on file    Inability: Not on file  . Transportation needs:    Medical: Not on file    Non-medical: Not on file  Tobacco Use  . Smoking status:  Former Smoker    Packs/day: 1.00    Years: 27.00    Pack years: 27.00    Types: Cigarettes  . Smokeless tobacco: Never Used  Substance and Sexual Activity  . Alcohol use: Yes    Alcohol/week: 0.0 standard drinks  . Drug use: No  . Sexual activity: Not on file  Lifestyle  . Physical activity:    Days per week: Not on file    Minutes per session: Not on file  . Stress: Not on file  Relationships  . Social connections:    Talks on phone: Not on file    Gets together: Not on file    Attends religious service: Not on file    Active member of club or organization: Not on file    Attends meetings of clubs or organizations: Not on file    Relationship status: Not on file  . Intimate partner violence:    Fear of current or ex partner: Not on file    Emotionally abused: Not on file    Physically abused: Not on file    Forced  sexual activity: Not on file  Other Topics Concern  . Not on file  Social History Narrative   Occasional alcohol use: hard liquor, wine and beer    Outpatient Medications Prior to Visit  Medication Sig Dispense Refill  . COD LIVER OIL PO Take by mouth. Reported on 07/16/2015    . Cyanocobalamin (VITAMIN B-12) 5000 MCG TBDP Take 5,000 mcg by mouth every 30 (thirty) days.     Marland Kitchen itraconazole (SPORANOX) 100 MG capsule Take 200 mg by mouth 2 (two) times daily.    . Nutritional Supplements (QUINOA KALE & HEMP PO) Take by mouth.    . Probiotic Product (PROBIOTIC-10) CAPS Take by mouth.    . ranitidine (ZANTAC) 150 MG capsule Take 150 mg by mouth daily as needed for heartburn.    . RED YEAST RICE EXTRACT PO Take by mouth. Reported on 07/16/2015    . Wheat Dextrin (BENEFIBER DRINK MIX PO) Take by mouth.    Francia Greaves THYROID 90 MG tablet Take 1 tablet daily. 90 tablet 3  . irbesartan (AVAPRO) 150 MG tablet Take 1 tablet (150 mg total) by mouth daily. 90 tablet 3  . metFORMIN (GLUCOPHAGE) 500 MG tablet TAKE 1 TABLET BY MOUTH DAILY WITH BREAKFAST 30 tablet 0  . metoprolol succinate (TOPROL-XL) 25 MG 24 hr tablet Take 1 tablet (25 mg total) by mouth daily. 90 tablet 3  . Vitamin D, Ergocalciferol, (DRISDOL) 50000 units CAPS capsule TAKE 1 CAPSULE BY MOUTH EVERY 7 DAYS 4 capsule 0   No facility-administered medications prior to visit.     Allergies  Allergen Reactions  . Diclofenac Itching, Nausea Only and Other (See Comments)    Wheezing  . Hydrochlorothiazide     Wheezing. Losing teeth.  . Statins     Review of Systems  Constitutional: Negative for fever and malaise/fatigue.  HENT: Negative for congestion.   Eyes: Negative for blurred vision.  Respiratory: Negative for shortness of breath.   Cardiovascular: Negative for chest pain, palpitations and leg swelling.  Gastrointestinal: Positive for heartburn. Negative for abdominal pain, blood in stool and nausea.  Genitourinary: Negative for  dysuria and frequency.  Musculoskeletal: Negative for falls.  Skin: Negative for rash.  Neurological: Negative for dizziness, loss of consciousness and headaches.  Endo/Heme/Allergies: Negative for environmental allergies.  Psychiatric/Behavioral: Negative for depression. The patient is not nervous/anxious.  Objective:    Physical Exam  Constitutional: She is oriented to person, place, and time. No distress.  HENT:  Head: Normocephalic and atraumatic.  Right Ear: External ear normal.  Left Ear: External ear normal.  Nose: Nose normal.  Mouth/Throat: Oropharynx is clear and moist. No oropharyngeal exudate.  Eyes: Pupils are equal, round, and reactive to light. Conjunctivae are normal. Right eye exhibits no discharge. Left eye exhibits no discharge. No scleral icterus.  Neck: Normal range of motion. Neck supple. No thyromegaly present.  Cardiovascular: Normal rate, regular rhythm, normal heart sounds and intact distal pulses.  No murmur heard. Pulmonary/Chest: Effort normal and breath sounds normal. No respiratory distress. She has no wheezes. She has no rales.  Abdominal: Soft. Bowel sounds are normal. She exhibits no distension and no mass. There is no tenderness.  Musculoskeletal: Normal range of motion. She exhibits no edema or tenderness.  Lymphadenopathy:    She has no cervical adenopathy.  Neurological: She is alert and oriented to person, place, and time. She has normal reflexes. She displays normal reflexes. No cranial nerve deficit. Coordination normal.  Skin: Skin is warm and dry. No rash noted. She is not diaphoretic.    BP (!) 162/102 (BP Location: Left Arm, Patient Position: Sitting, Cuff Size: Normal)   Pulse 74   Temp 98.2 F (36.8 C) (Oral)   Resp 18   Ht 5\' 3"  (1.6 m)   Wt 217 lb 12.5 oz (98.8 kg)   SpO2 97%   BMI 38.58 kg/m  Wt Readings from Last 3 Encounters:  02/10/18 217 lb 12.5 oz (98.8 kg)  12/30/17 215 lb (97.5 kg)  11/17/17 212 lb (96.2 kg)    BP Readings from Last 3 Encounters:  02/10/18 (!) 162/102  12/30/17 122/72  11/17/17 122/76     Immunization History  Administered Date(s) Administered  . Pneumococcal Conjugate-13 02/10/2018  . Tdap 04/01/2017    Health Maintenance  Topic Date Due  . HIV Screening  09/22/1967  . PAP SMEAR  05/14/2017  . DEXA SCAN  09/21/2017  . PNA vac Low Risk Adult (1 of 2 - PCV13) 09/21/2017  . INFLUENZA VACCINE  01/06/2018  . MAMMOGRAM  07/23/2018  . COLONOSCOPY  08/15/2024  . TETANUS/TDAP  04/02/2027  . Hepatitis C Screening  Completed    Lab Results  Component Value Date   WBC 3.5 11/17/2017   HGB 13.6 11/17/2017   HCT 40.2 11/17/2017   PLT 307.0 12/21/2016   GLUCOSE 101 (H) 11/17/2017   CHOL 219 (H) 11/17/2017   TRIG 173 (H) 11/17/2017   HDL 42 11/17/2017   LDLDIRECT 146.0 12/21/2016   LDLCALC 142 (H) 11/17/2017   ALT 23 11/17/2017   AST 17 11/17/2017   NA 140 11/17/2017   K 4.4 11/17/2017   CL 102 11/17/2017   CREATININE 0.82 11/17/2017   BUN 12 11/17/2017   CO2 23 11/17/2017   TSH 4.360 11/17/2017   HGBA1C 5.3 11/17/2017    Lab Results  Component Value Date   TSH 4.360 11/17/2017   Lab Results  Component Value Date   WBC 3.5 11/17/2017   HGB 13.6 11/17/2017   HCT 40.2 11/17/2017   MCV 89 11/17/2017   PLT 307.0 12/21/2016   Lab Results  Component Value Date   NA 140 11/17/2017   K 4.4 11/17/2017   CO2 23 11/17/2017   GLUCOSE 101 (H) 11/17/2017   BUN 12 11/17/2017   CREATININE 0.82 11/17/2017   BILITOT 0.4 11/17/2017   ALKPHOS  70 11/17/2017   AST 17 11/17/2017   ALT 23 11/17/2017   PROT 6.8 11/17/2017   ALBUMIN 4.4 11/17/2017   CALCIUM 9.6 11/17/2017   GFR 71.51 12/21/2016   Lab Results  Component Value Date   CHOL 219 (H) 11/17/2017   Lab Results  Component Value Date   HDL 42 11/17/2017   Lab Results  Component Value Date   LDLCALC 142 (H) 11/17/2017   Lab Results  Component Value Date   TRIG 173 (H) 11/17/2017   Lab Results   Component Value Date   CHOLHDL 5 12/21/2016   Lab Results  Component Value Date   HGBA1C 5.3 11/17/2017         Assessment & Plan:   Problem List Items Addressed This Visit    Essential hypertension    Well controlled, no changes to meds. Encouraged heart healthy diet such as the DASH diet and exercise as tolerated.       Relevant Medications   metoprolol succinate (TOPROL-XL) 25 MG 24 hr tablet   irbesartan (AVAPRO) 150 MG tablet   nitroGLYCERIN (NITROSTAT) 0.4 MG SL tablet   Mixed hyperlipidemia    Encouraged heart healthy diet, increase exercise, avoid trans fats, consider a krill oil cap daily      Relevant Medications   metoprolol succinate (TOPROL-XL) 25 MG 24 hr tablet   irbesartan (AVAPRO) 150 MG tablet   nitroGLYCERIN (NITROSTAT) 0.4 MG SL tablet   Vitamin D deficiency    Supplement and monitor, feels her fatigue is improving since she started supplementing. Has just started 50000 IU weekly      Relevant Medications   Vitamin D, Ergocalciferol, (DRISDOL) 50000 units CAPS capsule   Prediabetes    hgba1c acceptable, minimize simple carbs. Increase exercise as tolerated.      Relevant Medications   metFORMIN (GLUCOPHAGE) 500 MG tablet   Insulin resistance    Tolerating Metformin well.      Constipation    Doing well with benefiber and probiotics daily      Abdominal pain    B/l lower quadrants has random pain and has had ovarian cysts in past the pain has come and go. We will proceed with ultrasound.       Relevant Orders   US Transvaginal Non-OB   US Abdomen Complete   Atypical chest pain   Relevant Orders   Ambulatory referral to Cardiology    Other Visit Diagnoses    Dyspepsia    -  Primary   Relevant Orders   H. pylori antibody, IgG      I have changed Riva A. Bonilla's metFORMIN. I am also having her start on nitroGLYCERIN. Additionally, I am having her maintain her COD LIVER OIL PO, RED YEAST RICE EXTRACT PO, Vitamin B-12, ranitidine,  PROBIOTIC-10, Wheat Dextrin (BENEFIBER DRINK MIX PO), Nutritional Supplements (QUINOA KALE & HEMP PO), itraconazole, Vitamin D (Ergocalciferol), ARMOUR THYROID, metoprolol succinate, and irbesartan.  Meds ordered this encounter  Medications  . Vitamin D, Ergocalciferol, (DRISDOL) 50000 units CAPS capsule    Sig: TAKE 1 CAPSULE BY MOUTH EVERY 7 DAYS    Dispense:  12 capsule    Refill:  4  . metFORMIN (GLUCOPHAGE) 500 MG tablet    Sig: Take 1 tablet (500 mg total) by mouth daily with breakfast.    Dispense:  90 tablet    Refill:  1  . ARMOUR THYROID 90 MG tablet    Sig: Take 1 tablet daily.    Dispense:  90  tablet    Refill:  3  . metoprolol succinate (TOPROL-XL) 25 MG 24 hr tablet    Sig: Take 1 tablet (25 mg total) by mouth daily.    Dispense:  90 tablet    Refill:  3  . irbesartan (AVAPRO) 150 MG tablet    Sig: Take 1 tablet (150 mg total) by mouth daily.    Dispense:  90 tablet    Refill:  3  . nitroGLYCERIN (NITROSTAT) 0.4 MG SL tablet    Sig: Place 1 tablet (0.4 mg total) under the tongue every 5 (five) minutes as needed for chest pain.    Dispense:  25 tablet    Refill:  3   CMA served as scribe during this visit. History, Physical and Plan performed by medical provider. Documentation and orders reviewed and attested to.  Penni Homans, MD

## 2018-02-10 NOTE — Assessment & Plan Note (Signed)
Tolerating Metformin well.

## 2018-02-10 NOTE — Assessment & Plan Note (Signed)
Doing well with benefiber and probiotics daily

## 2018-02-10 NOTE — Assessment & Plan Note (Signed)
She reports it happens with dyspepsia and burping makes it better but can also happen under emotional  Duress. She s given a prescription for NTG SL. She will seek care if not effectvie.

## 2018-02-10 NOTE — Assessment & Plan Note (Signed)
B/l lower quadrants has random pain and has had ovarian cysts in past the pain has come and go. We will proceed with ultrasound.

## 2018-02-17 ENCOUNTER — Other Ambulatory Visit: Payer: Self-pay | Admitting: Family Medicine

## 2018-02-17 DIAGNOSIS — R1030 Lower abdominal pain, unspecified: Secondary | ICD-10-CM

## 2018-03-23 ENCOUNTER — Telehealth: Payer: Self-pay | Admitting: Family Medicine

## 2018-03-23 DIAGNOSIS — I10 Essential (primary) hypertension: Secondary | ICD-10-CM

## 2018-03-23 DIAGNOSIS — R7303 Prediabetes: Secondary | ICD-10-CM

## 2018-03-23 NOTE — Telephone Encounter (Signed)
Copied from [place] [id number]. Topic: Quick Communication - See Telephone Encounter >> Mar 23, 2018 10:26 AM Conception Chancy, NT wrote: CRM for notification. See Telephone encounter for: 03/23/18.  Patient is calling and sates she is out of town at [facility] until Sunday. She has 1 days worth left of pills as she left the rest at home. She would like to know can a couple of pills be called in for each of the follow prescriptions. Please contact patient. [phone]  ARMOUR THYROID 90 MG tablet irbesartan (AVAPRO) 150 MG tablet  metFORMIN (GLUCOPHAGE) 500 MG tablet metoprolol succinate (TOPROL-XL) 25 MG 24 hr tablet    [facility] [address] [place], [place] - [address] [place] [address] Phone: [phone] Fax: [phone]

## 2018-03-24 MED ORDER — IRBESARTAN 150 MG PO TABS: 150.0000 mg | ORAL_TABLET | Freq: Every day | ORAL | 0 refills | Status: DC

## 2018-03-24 MED ORDER — METFORMIN HCL 500 MG PO TABS: 500.0000 mg | ORAL_TABLET | Freq: Every day | ORAL | 0 refills | Status: DC

## 2018-03-24 MED ORDER — METOPROLOL SUCCINATE ER 25 MG PO TB24: 25.0000 mg | ORAL_TABLET | Freq: Every day | ORAL | 0 refills | Status: DC

## 2018-03-24 MED ORDER — ARMOUR THYROID 90 MG PO TABS: ORAL_TABLET | ORAL | 0 refills | Status: DC

## 2018-03-24 NOTE — Telephone Encounter (Signed)
Tried to call the patient several times number had been busy. Called to let her know I sent in 7 days worth of medication. Patient will need to call when she needs a regular refill on her medication in order to have the correct refill amount instead of 7 pills   Nurse triage may handle

## 2018-03-24 NOTE — Telephone Encounter (Signed)
Called pt and informed her of the refills. Pt states that the pharmacy has already informed her they will be ready in 1 hour.

## 2018-04-12 ENCOUNTER — Other Ambulatory Visit (HOSPITAL_BASED_OUTPATIENT_CLINIC_OR_DEPARTMENT_OTHER): Payer: Medicare Other

## 2018-04-12 ENCOUNTER — Ambulatory Visit (HOSPITAL_BASED_OUTPATIENT_CLINIC_OR_DEPARTMENT_OTHER)
Admission: RE | Admit: 2018-04-12 | Discharge: 2018-04-12 | Disposition: A | Discharge status: Home / Self Care | Payer: Medicare Other | Source: Ambulatory Visit | Attending: Family Medicine | Admitting: Family Medicine

## 2018-04-12 ENCOUNTER — Encounter (HOSPITAL_BASED_OUTPATIENT_CLINIC_OR_DEPARTMENT_OTHER): Payer: Self-pay

## 2018-04-12 ENCOUNTER — Ambulatory Visit (INDEPENDENT_AMBULATORY_CARE_PROVIDER_SITE_OTHER): Payer: Medicare Other | Admitting: Family Medicine

## 2018-04-12 VITALS — BP 122/58 | HR 73 | Temp 98.2°F | Resp 18 | Wt 224.4 lb

## 2018-04-12 DIAGNOSIS — M1990 Unspecified osteoarthritis, unspecified site: Secondary | ICD-10-CM

## 2018-04-12 DIAGNOSIS — R1030 Lower abdominal pain, unspecified: Secondary | ICD-10-CM

## 2018-04-12 DIAGNOSIS — R9389 Abnormal findings on diagnostic imaging of other specified body structures: Secondary | ICD-10-CM | POA: Diagnosis not present

## 2018-04-12 DIAGNOSIS — I10 Essential (primary) hypertension: Secondary | ICD-10-CM | POA: Diagnosis not present

## 2018-04-12 DIAGNOSIS — R1032 Left lower quadrant pain: Secondary | ICD-10-CM | POA: Diagnosis not present

## 2018-04-12 DIAGNOSIS — E038 Other specified hypothyroidism: Secondary | ICD-10-CM | POA: Diagnosis not present

## 2018-04-12 DIAGNOSIS — K8020 Calculus of gallbladder without cholecystitis without obstruction: Secondary | ICD-10-CM | POA: Insufficient documentation

## 2018-04-12 DIAGNOSIS — E8881 Metabolic syndrome: Secondary | ICD-10-CM | POA: Diagnosis not present

## 2018-04-12 DIAGNOSIS — N8500 Endometrial hyperplasia, unspecified: Secondary | ICD-10-CM

## 2018-04-12 DIAGNOSIS — E782 Mixed hyperlipidemia: Secondary | ICD-10-CM

## 2018-04-12 DIAGNOSIS — R1031 Right lower quadrant pain: Secondary | ICD-10-CM | POA: Diagnosis not present

## 2018-04-12 DIAGNOSIS — E559 Vitamin D deficiency, unspecified: Secondary | ICD-10-CM

## 2018-04-12 DIAGNOSIS — E538 Deficiency of other specified B group vitamins: Secondary | ICD-10-CM

## 2018-04-12 DIAGNOSIS — I700 Atherosclerosis of aorta: Secondary | ICD-10-CM | POA: Insufficient documentation

## 2018-04-12 DIAGNOSIS — R7303 Prediabetes: Secondary | ICD-10-CM

## 2018-04-12 DIAGNOSIS — K5900 Constipation, unspecified: Secondary | ICD-10-CM | POA: Diagnosis not present

## 2018-04-12 LAB — HEMOGLOBIN A1C: HEMOGLOBIN A1C: 5.6 % (ref 4.6–6.5)

## 2018-04-12 LAB — COMPREHENSIVE METABOLIC PANEL
ALBUMIN: 4.3 g/dL (ref 3.5–5.2)
ALT: 23 U/L (ref 0–35)
AST: 17 U/L (ref 0–37)
Alkaline Phosphatase: 64 U/L (ref 39–117)
BUN: 11 mg/dL (ref 6–23)
CALCIUM: 9.6 mg/dL (ref 8.4–10.5)
CO2: 29 mEq/L (ref 19–32)
Chloride: 102 mEq/L (ref 96–112)
Creatinine, Ser: 0.75 mg/dL (ref 0.40–1.20)
GFR: 82.29 mL/min (ref >60.00)
GLUCOSE: 100 mg/dL — AB (ref 70–99)
Potassium: 4.3 mEq/L (ref 3.5–5.1)
Sodium: 139 mEq/L (ref 135–145)
Total Bilirubin: 0.4 mg/dL (ref 0.2–1.2)
Total Protein: 6.8 g/dL (ref 6.0–8.3)

## 2018-04-12 LAB — CBC
HCT: 39.8 % (ref 36.0–46.0)
HEMOGLOBIN: 13.4 g/dL (ref 12.0–15.0)
MCHC: 33.7 g/dL (ref 30.0–36.0)
MCV: 89.2 fl (ref 78.0–100.0)
PLATELETS: 293 10*3/uL (ref 150.0–400.0)
RBC: 4.47 Mil/uL (ref 3.87–5.11)
RDW: 13.5 % (ref 11.5–15.5)
WBC: 4.5 10*3/uL (ref 4.0–10.5)

## 2018-04-12 LAB — LIPID PANEL
Cholesterol: 234 mg/dL — ABNORMAL HIGH (ref 0–200)
HDL: 37 mg/dL — AB (ref >39.00)
Total CHOL/HDL Ratio: 6

## 2018-04-12 LAB — T3, FREE: T3, Free: 3.2 pg/mL (ref 2.3–4.2)

## 2018-04-12 LAB — T4, FREE: Free T4: 0.53 ng/dL — ABNORMAL LOW (ref 0.60–1.60)

## 2018-04-12 LAB — VITAMIN D 25 HYDROXY (VIT D DEFICIENCY, FRACTURES): VITD: 36.57 ng/mL (ref 30.00–100.00)

## 2018-04-12 LAB — SEDIMENTATION RATE: Sed Rate: 13 mm/hr (ref 0–30)

## 2018-04-12 LAB — VITAMIN B12: Vitamin B-12: 751 pg/mL (ref 211–911)

## 2018-04-12 LAB — LDL CHOLESTEROL, DIRECT: Direct LDL: 158 mg/dL

## 2018-04-12 LAB — TSH: TSH: 3.1 u[IU]/mL (ref 0.35–4.50)

## 2018-04-12 MED ORDER — ARMOUR THYROID 90 MG PO TABS: ORAL_TABLET | ORAL | 1 refills | Status: DC

## 2018-04-12 MED ORDER — METFORMIN HCL 500 MG PO TABS: 500.0000 mg | ORAL_TABLET | Freq: Every day | ORAL | 1 refills | Status: DC

## 2018-04-12 MED ORDER — METOPROLOL SUCCINATE ER 25 MG PO TB24: 25.0000 mg | ORAL_TABLET | Freq: Every day | ORAL | 1 refills | Status: DC

## 2018-04-12 MED ORDER — IRBESARTAN 150 MG PO TABS: 150.0000 mg | ORAL_TABLET | Freq: Every day | ORAL | 1 refills | Status: DC

## 2018-04-12 NOTE — Assessment & Plan Note (Signed)
Notes the metformin is helping her evening cravings

## 2018-04-12 NOTE — Assessment & Plan Note (Signed)
Encouraged heart healthy diet, increase exercise, avoid trans fats, consider a krill oil cap daily 

## 2018-04-12 NOTE — Assessment & Plan Note (Signed)
Check labs 

## 2018-04-12 NOTE — Assessment & Plan Note (Signed)
Supplement and monitor 

## 2018-04-12 NOTE — Patient Instructions (Signed)
Hypothyroidism Hypothyroidism is a disorder of the thyroid. The thyroid is a large gland that is located in the lower front of the neck. The thyroid releases hormones that control how the body works. With hypothyroidism, the thyroid does not make enough of these hormones. What are the causes? Causes of hypothyroidism may include:  Viral infections.  Pregnancy.  Your own defense system (immune system) attacking your thyroid.  Certain medicines.  Birth defects.  Past radiation treatments to your head or neck.  Past treatment with radioactive iodine.  Past surgical removal of part or all of your thyroid.  Problems with the gland that is located in the center of your brain (pituitary).  What are the signs or symptoms? Signs and symptoms of hypothyroidism may include:  Feeling as though you have no energy (lethargy).  Inability to tolerate cold.  Weight gain that is not explained by a change in diet or exercise habits.  Dry skin.  Coarse hair.  Menstrual irregularity.  Slowing of thought processes.  Constipation.  Sadness or depression.  How is this diagnosed? Your health care provider may diagnose hypothyroidism with blood tests and ultrasound tests. How is this treated? Hypothyroidism is treated with medicine that replaces the hormones that your body does not make. After you begin treatment, it may take several weeks for symptoms to go away. Follow these instructions at home:  Take medicines only as directed by your health care provider.  If you start taking any new medicines, tell your health care provider.  Keep all follow-up visits as directed by your health care provider. This is important. As your condition improves, your dosage needs may change. You will need to have blood tests regularly so that your health care provider can watch your condition. Contact a health care provider if:  Your symptoms do not get better with treatment.  You are taking thyroid  replacement medicine and: ? You sweat excessively. ? You have tremors. ? You feel anxious. ? You lose weight rapidly. ? You cannot tolerate heat. ? You have emotional swings. ? You have diarrhea. ? You feel weak. Get help right away if:  You develop chest pain.  You develop an irregular heartbeat.  You develop a rapid heartbeat. This information is not intended to replace advice given to you by your health care provider. Make sure you discuss any questions you have with your health care provider. Document Released: 05/25/2005 Document Revised: 10/31/2015 Document Reviewed: 10/10/2013 Elsevier Interactive Patient Education  2018 Elsevier Inc.  

## 2018-04-12 NOTE — Assessment & Plan Note (Signed)
Encouraged DASH diet, decrease po intake and increase exercise as tolerated. Needs 7-8 hours of sleep nightly. Avoid trans fats, eat small, frequent meals every 4-5 hours with lean proteins, complex carbs and healthy fats. Minimize simple carbs, has stopped seeing healthy weight and wellness.

## 2018-04-12 NOTE — Assessment & Plan Note (Signed)
Well controlled, no changes to meds. Encouraged heart healthy diet such as the DASH diet and exercise as tolerated.  °

## 2018-04-12 NOTE — Assessment & Plan Note (Signed)
Stiffness and pain in hands. Will check labs. Can continue Aleve and CBD cream consider adding Lidocaine gel

## 2018-04-13 DIAGNOSIS — N8500 Endometrial hyperplasia, unspecified: Secondary | ICD-10-CM | POA: Insufficient documentation

## 2018-04-13 NOTE — Progress Notes (Signed)
Subjective:    Patient ID: Destiny [name], female    DOB: Dec 26, 1950, 67 y.o.   MRN: [id number]  No chief complaint on file.   HPI Patient is in today for follow up. She feels well today. No recent febrile illness or hospitalizations. She is trying to maintain a  Heart healthy diet. Denies CP/palp/SOB/HA/congestion/fevers/GI or GU c/o. Taking meds as prescribed  Past Medical History:  Diagnosis Date  . Abnormal blood chemistry   . Arthropathy   . Back pain   . Breast cyst   . Cellulitis   . Chest discomfort   . Constipation   . Disturbance of skin sensation   . Edema   . Encounter for screening and preventative care 06/17/2016  . GERD (gastroesophageal reflux disease)   . Hiatal hernia with gastroesophageal reflux 06/17/2016  . High risk medication use   . Hoarseness 06/15/2016  . Hyperglycemia   . Hyperglycemia 12/21/2016  . Hyperlipidemia   . Hyperlipidemia, mixed 06/15/2016  . Hypertension   . Hypopotassemia   . Hypothyroidism   . Joint pain   . Menopause   . Obesity   . Palpitations   . Positive ANA (antinuclear antibody)   . Shoulder pain, right   . Sleep apnea   . SOB (shortness of breath)   . Thyroid disease 12/11/2014  . Thyroid nodule   . Urinary incontinence 04/01/2017  . Vitamin D deficiency   . Vitamin D deficiency     Past Surgical History:  Procedure Laterality Date  . BREAST CYST ASPIRATION Left    30 years ago  1970's or 1980's  . LIPOSUCTION  1990   over gluteal area   . WISDOM TOOTH EXTRACTION     and all upper teeth    Family History  Problem Relation Age of Onset  . Irregular heart beat Mother   . Heart disease Mother   . Rheumatic fever Mother   . Hypertension Father   . Ulcers Father   . Hyperlipidemia Father   . Vision loss Brother   . Stroke Maternal Grandmother   . Diabetes Maternal Grandmother   . Hypertension Paternal Grandmother   . Ulcers Paternal Grandfather     Social History   Socioeconomic History  . Marital status:  Married    Spouse name: Aanvi Voyles  . Number of children: Not on file  . Years of education: Not on file  . Highest education level: Not on file  Occupational History  . Occupation: homemaker  Social Needs  . Financial resource strain: Not on file  . Food insecurity:    Worry: Not on file    Inability: Not on file  . Transportation needs:    Medical: Not on file    Non-medical: Not on file  Tobacco Use  . Smoking status: Former Smoker    Packs/day: 1.00    Years: 27.00    Pack years: 27.00    Types: Cigarettes    Last attempt to quit: 2011    Years since quitting: 8.8  . Smokeless tobacco: Never Used  Substance and Sexual Activity  . Alcohol use: Yes    Alcohol/week: 0.0 standard drinks  . Drug use: No  . Sexual activity: Not Currently    Birth control/protection: Post-menopausal    Comment: menopause around age [age]  Lifestyle  . Physical activity:    Days per week: Not on file    Minutes per session: Not on file  . Stress: Not on file  Relationships  . Social connections:    Talks on phone: Not on file    Gets together: Not on file    Attends religious service: Not on file    Active member of club or organization: Not on file    Attends meetings of clubs or organizations: Not on file    Relationship status: Not on file  . Intimate partner violence:    Fear of current or ex partner: Not on file    Emotionally abused: Not on file    Physically abused: Not on file    Forced sexual activity: Not on file  Other Topics Concern  . Not on file  Social History Narrative   Occasional alcohol use: hard liquor, wine and beer    Outpatient Medications Prior to Visit  Medication Sig Dispense Refill  . COD LIVER OIL PO Take by mouth. Reported on 07/16/2015    . Cyanocobalamin (VITAMIN B-12) 5000 MCG TBDP Take 5,000 mcg by mouth every 30 (thirty) days.     Marland Kitchen itraconazole (SPORANOX) 100 MG capsule Take 200 mg by mouth 2 (two) times daily.    . nitroGLYCERIN (NITROSTAT)  0.4 MG SL tablet Place 1 tablet (0.4 mg total) under the tongue every 5 (five) minutes as needed for chest pain. 25 tablet 3  . Nutritional Supplements (QUINOA KALE & HEMP PO) Take by mouth.    . Probiotic Product (PROBIOTIC-10) CAPS Take by mouth.    . ranitidine (ZANTAC) 150 MG capsule Take 150 mg by mouth daily as needed for heartburn.    . RED YEAST RICE EXTRACT PO Take by mouth. Reported on 07/16/2015    . Vitamin D, Ergocalciferol, (DRISDOL) 50000 units CAPS capsule TAKE 1 CAPSULE BY MOUTH EVERY 7 DAYS 12 capsule 4  . Wheat Dextrin (BENEFIBER DRINK MIX PO) Take by mouth.    Francia Greaves THYROID 90 MG tablet Take 1 tablet daily. 7 tablet 0  . irbesartan (AVAPRO) 150 MG tablet Take 1 tablet (150 mg total) by mouth daily. 7 tablet 0  . metFORMIN (GLUCOPHAGE) 500 MG tablet Take 1 tablet (500 mg total) by mouth daily with breakfast. 7 tablet 0  . metoprolol succinate (TOPROL-XL) 25 MG 24 hr tablet Take 1 tablet (25 mg total) by mouth daily. 7 tablet 0   No facility-administered medications prior to visit.     Allergies  Allergen Reactions  . Diclofenac Itching, Nausea Only and Other (See Comments)    Wheezing  . Hydrochlorothiazide     Wheezing. Losing teeth.  . Statins     Review of Systems  Constitutional: Positive for malaise/fatigue. Negative for fever.  HENT: Negative for congestion.   Eyes: Negative for blurred vision.  Respiratory: Negative for shortness of breath.   Cardiovascular: Negative for chest pain, palpitations and leg swelling.  Gastrointestinal: Negative for abdominal pain, blood in stool and nausea.  Genitourinary: Negative for dysuria and frequency.  Musculoskeletal: Negative for falls.  Skin: Negative for rash.  Neurological: Negative for dizziness, loss of consciousness and headaches.  Endo/Heme/Allergies: Negative for environmental allergies.  Psychiatric/Behavioral: Negative for depression. The patient is not nervous/anxious.        Objective:    Physical  Exam  Constitutional: She is oriented to person, place, and time. She appears well-developed and well-nourished. No distress.  HENT:  Head: Normocephalic and atraumatic.  Nose: Nose normal.  Eyes: Right eye exhibits no discharge. Left eye exhibits no discharge.  Neck: Normal range of motion. Neck supple.  Cardiovascular: Normal rate  and regular rhythm.  No murmur heard. Pulmonary/Chest: Effort normal and breath sounds normal.  Abdominal: Soft. Bowel sounds are normal. There is no tenderness.  Musculoskeletal: She exhibits no edema.  Neurological: She is alert and oriented to person, place, and time.  Skin: Skin is warm and dry.  Psychiatric: She has a normal mood and affect.  Nursing note and vitals reviewed.   BP (!) 122/58 (BP Location: Left Arm, Patient Position: Sitting, Cuff Size: Normal)   Pulse 73   Temp 98.2 F (36.8 C) (Oral)   Resp 18   Wt 224 lb 6.4 oz (101.8 kg)   SpO2 98%   BMI 39.75 kg/m  Wt Readings from Last 3 Encounters:  04/12/18 224 lb 6.4 oz (101.8 kg)  02/10/18 217 lb 12.5 oz (98.8 kg)  12/30/17 215 lb (97.5 kg)     Lab Results  Component Value Date   WBC 4.5 04/12/2018   HGB 13.4 04/12/2018   HCT 39.8 04/12/2018   PLT 293.0 04/12/2018   GLUCOSE 100 (H) 04/12/2018   CHOL 234 (H) 04/12/2018   TRIG (H) 04/12/2018    427.0 Triglyceride is over 400; calculations on Lipids are invalid.   HDL 37.00 (L) 04/12/2018   LDLDIRECT 158.0 04/12/2018   LDLCALC 142 (H) 11/17/2017   ALT 23 04/12/2018   AST 17 04/12/2018   NA 139 04/12/2018   K 4.3 04/12/2018   CL 102 04/12/2018   CREATININE 0.75 04/12/2018   BUN 11 04/12/2018   CO2 29 04/12/2018   TSH 3.10 04/12/2018   HGBA1C 5.6 04/12/2018    Lab Results  Component Value Date   TSH 3.10 04/12/2018   Lab Results  Component Value Date   WBC 4.5 04/12/2018   HGB 13.4 04/12/2018   HCT 39.8 04/12/2018   MCV 89.2 04/12/2018   PLT 293.0 04/12/2018   Lab Results  Component Value Date   NA 139  04/12/2018   K 4.3 04/12/2018   CO2 29 04/12/2018   GLUCOSE 100 (H) 04/12/2018   BUN 11 04/12/2018   CREATININE 0.75 04/12/2018   BILITOT 0.4 04/12/2018   ALKPHOS 64 04/12/2018   AST 17 04/12/2018   ALT 23 04/12/2018   PROT 6.8 04/12/2018   ALBUMIN 4.3 04/12/2018   CALCIUM 9.6 04/12/2018   GFR 82.29 04/12/2018   Lab Results  Component Value Date   CHOL 234 (H) 04/12/2018   Lab Results  Component Value Date   HDL 37.00 (L) 04/12/2018   Lab Results  Component Value Date   LDLCALC 142 (H) 11/17/2017   Lab Results  Component Value Date   TRIG (H) 04/12/2018    427.0 Triglyceride is over 400; calculations on Lipids are invalid.   Lab Results  Component Value Date   CHOLHDL 6 04/12/2018   Lab Results  Component Value Date   HGBA1C 5.6 04/12/2018       Assessment & Plan:   Problem List Items Addressed This Visit    Hypothyroidism (Chronic)    Check labs.       Relevant Medications   metoprolol succinate (TOPROL-XL) 25 MG 24 hr tablet   ARMOUR THYROID 90 MG tablet   Other Relevant Orders   T4, free (Completed)   T3, free (Completed)   Thyroid peroxidase antibody (Completed)   Essential hypertension    Well controlled, no changes to meds. Encouraged heart healthy diet such as the DASH diet and exercise as tolerated.       Relevant Medications  irbesartan (AVAPRO) 150 MG tablet   metoprolol succinate (TOPROL-XL) 25 MG 24 hr tablet   Other Relevant Orders   CBC (Completed)   Comprehensive metabolic panel (Completed)   TSH (Completed)   Mixed hyperlipidemia    Encouraged heart healthy diet, increase exercise, avoid trans fats, consider a krill oil cap daily      Relevant Medications   irbesartan (AVAPRO) 150 MG tablet   metoprolol succinate (TOPROL-XL) 25 MG 24 hr tablet   Other Relevant Orders   Lipid panel (Completed)   Vitamin D deficiency    Supplement and monitor      Relevant Orders   VITAMIN D 25 Hydroxy (Vit-D Deficiency, Fractures)  (Completed)   Prediabetes   Relevant Medications   metFORMIN (GLUCOPHAGE) 500 MG tablet   Other Relevant Orders   Hemoglobin A1c (Completed)   Morbid obesity ([place])    Encouraged DASH diet, decrease po intake and increase exercise as tolerated. Needs 7-8 hours of sleep nightly. Avoid trans fats, eat small, frequent meals every 4-5 hours with lean proteins, complex carbs and healthy fats. Minimize simple carbs, has stopped seeing healthy weight and wellness.       Relevant Medications   metFORMIN (GLUCOPHAGE) 500 MG tablet   Insulin resistance    Notes the metformin is helping her evening cravings      Relevant Orders   Hemoglobin A1c (Completed)   Insulin-like growth factor   Vitamin B12 deficiency    Supplement and monitor      Relevant Orders   Vitamin B12 (Completed)   Arthritis    Stiffness and pain in hands. Will check labs. Can continue Aleve and CBD cream consider adding Lidocaine gel      Relevant Orders   Sedimentation rate (Completed)   Rheumatoid Factor (Completed)   Endometrium, hyperplasia - Primary   Relevant Orders   Ambulatory referral to Obstetrics / Gynecology      I am having [place]. Schalk maintain her COD LIVER OIL PO, RED YEAST RICE EXTRACT PO, Vitamin B-12, ranitidine, PROBIOTIC-10, Wheat Dextrin (BENEFIBER DRINK MIX PO), Nutritional Supplements (QUINOA KALE & HEMP PO), itraconazole, Vitamin D (Ergocalciferol), nitroGLYCERIN, irbesartan, metoprolol succinate, metFORMIN, and ARMOUR THYROID.  Meds ordered this encounter  Medications  . irbesartan (AVAPRO) 150 MG tablet    Sig: Take 1 tablet (150 mg total) by mouth daily.    Dispense:  90 tablet    Refill:  1  . metoprolol succinate (TOPROL-XL) 25 MG 24 hr tablet    Sig: Take 1 tablet (25 mg total) by mouth daily.    Dispense:  90 tablet    Refill:  1  . metFORMIN (GLUCOPHAGE) 500 MG tablet    Sig: Take 1 tablet (500 mg total) by mouth daily with breakfast.    Dispense:  90 tablet    Refill:  1   . ARMOUR THYROID 90 MG tablet    Sig: Take 1 tablet daily.    Dispense:  90 tablet    Refill:  1     Penni Homans, MD

## 2018-04-14 MED ORDER — LEVOTHYROXINE SODIUM 25 MCG PO TABS: 25.0000 ug | ORAL_TABLET | Freq: Every day | ORAL | 0 refills | Status: DC

## 2018-04-14 NOTE — Addendum Note (Signed)
Addended byDamita Dunnings D on: 04/14/2018 05:12 PM   Modules accepted: Orders

## 2018-04-15 LAB — THYROID PEROXIDASE ANTIBODY: Thyroperoxidase Ab SerPl-aCnc: 4 IU/mL (ref <9)

## 2018-04-15 LAB — INSULIN-LIKE GROWTH FACTOR
IGF-I, LC/MS: 42 ng/mL (ref 41–279)
Z-SCORE (FEMALE): -2 {STDV} (ref ?–2.0)

## 2018-04-15 LAB — RHEUMATOID FACTOR: Rhuematoid fact SerPl-aCnc: <14 IU/mL (ref <14)

## 2018-04-21 ENCOUNTER — Ambulatory Visit (HOSPITAL_BASED_OUTPATIENT_CLINIC_OR_DEPARTMENT_OTHER)
Admission: RE | Admit: 2018-04-21 | Discharge: 2018-04-21 | Disposition: A | Discharge status: Home / Self Care | Payer: Medicare Other | Source: Ambulatory Visit | Attending: Cardiology | Admitting: Cardiology

## 2018-04-21 ENCOUNTER — Encounter: Payer: Self-pay | Admitting: Cardiology

## 2018-04-21 ENCOUNTER — Ambulatory Visit (INDEPENDENT_AMBULATORY_CARE_PROVIDER_SITE_OTHER): Payer: Medicare Other | Admitting: Cardiology

## 2018-04-21 VITALS — BP 124/72 | HR 64 | Ht 63.0 in | Wt 222.0 lb

## 2018-04-21 DIAGNOSIS — I209 Angina pectoris, unspecified: Secondary | ICD-10-CM | POA: Insufficient documentation

## 2018-04-21 DIAGNOSIS — Z01818 Encounter for other preprocedural examination: Secondary | ICD-10-CM | POA: Insufficient documentation

## 2018-04-21 DIAGNOSIS — E782 Mixed hyperlipidemia: Secondary | ICD-10-CM

## 2018-04-21 DIAGNOSIS — R0989 Other specified symptoms and signs involving the circulatory and respiratory systems: Secondary | ICD-10-CM | POA: Diagnosis not present

## 2018-04-21 MED ORDER — ASPIRIN EC 81 MG PO TBEC: 81.0000 mg | DELAYED_RELEASE_TABLET | Freq: Once | ORAL | 3 refills | Status: AC

## 2018-04-21 NOTE — H&P (View-Only) (Signed)
Cardiology Office Note:    Date:  04/21/2018   ID:  Destiny [name], DOB [birth year], MRN [id number]  PCP:  Mosie Lukes, MD  Cardiologist:  Jenean Lindau, MD   Referring MD: Mosie Lukes, MD    ASSESSMENT:    1. Angina pectoris ([place])   2. Mixed hyperlipidemia   3. Morbid obesity ([place])    PLAN:    In order of problems listed above:  1. Prevention stressed with the patient.  Importance of compliance with diet and medication stressed and she vocalized understanding.  Diet was discussed for dyslipidemia and obesity and risks of obesity explained and she vocalized understanding and plans to be more aggressive with dieting and weight loss. 2. Sublingual nitroglycerin prescription was sent, its protocol and 911 protocol explained and the patient vocalized understanding questions were answered to the patient's satisfaction 3. Her chest pain symptoms are very concerning.In view of the patient's symptoms, I discussed with the patient options for evaluation. Invasive and noninvasive options were given to the patient. I discussed stress testing and coronary angiography and left heart catheterization at length. Benefits, pros and cons of each approach were discussed at length. Patient had multiple questions which were answered to the patient's satisfaction. Patient opted for invasive evaluation and we will set up for coronary angiography and left heart catheterization. Further recommendations will be made based on the findings with coronary angiography. In the interim if the patient has any significant symptoms in hospital to the nearest emergency room.    Medication Adjustments/Labs and Tests Ordered: Current medicines are reviewed at length with the patient today.  Concerns regarding medicines are outlined above.  No orders of the defined types were placed in this encounter.  No orders of the defined types were placed in this encounter.    History of Present Illness:    Destiny [name] is a 67 y.o. female who is being seen today for the evaluation of chest discomfort on exertion and stress at the request of Mosie Lukes, MD.  Patient is a pleasant 67 year old female.  She has past medical history of essential hypertension and morbid obesity.  She leads a sedentary lifestyle she mentions to me that she is very lax with her diet.  She is referred here because recently she has been noticing chest tightness whenever she is stressed.  She does not exercise much because of plantar fasciitis but whenever she is stressed up she notices like a tremendous amount of pressure on her chest going to the neck.  No radiations to the arm.  This has her concerned and she is here for that evaluation.  Her husband is accompanying her and is very supportive.  At the time of my evaluation, the patient is alert awake oriented and in no distress.  Past Medical History:  Diagnosis Date  . Abnormal blood chemistry   . Arthropathy   . Back pain   . Breast cyst   . Cellulitis   . Chest discomfort   . Constipation   . Disturbance of skin sensation   . Edema   . Encounter for screening and preventative care 06/17/2016  . GERD (gastroesophageal reflux disease)   . Hiatal hernia with gastroesophageal reflux 06/17/2016  . High risk medication use   . Hoarseness 06/15/2016  . Hyperglycemia   . Hyperglycemia 12/21/2016  . Hyperlipidemia   . Hyperlipidemia, mixed 06/15/2016  . Hypertension   . Hypopotassemia   . Hypothyroidism   . Joint pain   .  Menopause   . Obesity   . Palpitations   . Positive ANA (antinuclear antibody)   . Shoulder pain, right   . Sleep apnea   . SOB (shortness of breath)   . Thyroid disease 12/11/2014  . Thyroid nodule   . Urinary incontinence 04/01/2017  . Vitamin D deficiency   . Vitamin D deficiency     Past Surgical History:  Procedure Laterality Date  . BREAST CYST ASPIRATION Left    30 years ago  1970's or 1980's  . LIPOSUCTION  1990   over gluteal area   .  WISDOM TOOTH EXTRACTION     and all upper teeth    Current Medications: Current Meds  Medication Sig  . COD LIVER OIL PO Take by mouth. Reported on 07/16/2015  . Cyanocobalamin (VITAMIN B-12) 5000 MCG TBDP Take 5,000 mcg by mouth every 30 (thirty) days.   . irbesartan (AVAPRO) 150 MG tablet Take 1 tablet (150 mg total) by mouth daily.  Marland Kitchen itraconazole (SPORANOX) 100 MG capsule Take 200 mg by mouth 2 (two) times daily.  Marland Kitchen levothyroxine (SYNTHROID, LEVOTHROID) 25 MCG tablet Take 1 tablet (25 mcg total) by mouth daily before breakfast.  . metFORMIN (GLUCOPHAGE) 500 MG tablet Take 1 tablet (500 mg total) by mouth daily with breakfast.  . metoprolol succinate (TOPROL-XL) 25 MG 24 hr tablet Take 1 tablet (25 mg total) by mouth daily.  . nitroGLYCERIN (NITROSTAT) 0.4 MG SL tablet Place 1 tablet (0.4 mg total) under the tongue every 5 (five) minutes as needed for chest pain.  . Nutritional Supplements (QUINOA KALE & HEMP PO) Take by mouth.  . Probiotic Product (PROBIOTIC-10) CAPS Take 1 capsule by mouth.   . RED YEAST RICE EXTRACT PO Take 1 capsule by mouth daily. Reported on 07/16/2015  . Vitamin D, Ergocalciferol, (DRISDOL) 50000 units CAPS capsule TAKE 1 CAPSULE BY MOUTH EVERY 7 DAYS  . Wheat Dextrin (BENEFIBER DRINK MIX PO) Take 1 capsule by mouth daily.      Allergies:   Diclofenac; Hydrochlorothiazide; and Statins   Social History   Socioeconomic History  . Marital status: Married    Spouse name: Nataliyah Packham  . Number of children: Not on file  . Years of education: Not on file  . Highest education level: Not on file  Occupational History  . Occupation: homemaker  Social Needs  . Financial resource strain: Not on file  . Food insecurity:    Worry: Not on file    Inability: Not on file  . Transportation needs:    Medical: Not on file    Non-medical: Not on file  Tobacco Use  . Smoking status: Former Smoker    Packs/day: 1.00    Years: 27.00    Pack years: 27.00    Types:  Cigarettes    Last attempt to quit: 2011    Years since quitting: 8.8  . Smokeless tobacco: Never Used  Substance and Sexual Activity  . Alcohol use: Yes    Alcohol/week: 0.0 standard drinks  . Drug use: No  . Sexual activity: Not Currently    Birth control/protection: Post-menopausal    Comment: menopause around age [age]  Lifestyle  . Physical activity:    Days per week: Not on file    Minutes per session: Not on file  . Stress: Not on file  Relationships  . Social connections:    Talks on phone: Not on file    Gets together: Not on file    Attends  religious service: Not on file    Active member of club or organization: Not on file    Attends meetings of clubs or organizations: Not on file    Relationship status: Not on file  Other Topics Concern  . Not on file  Social History Narrative   Occasional alcohol use: hard liquor, wine and beer     Family History: The patient's family history includes Diabetes in her maternal grandmother; Heart disease in her mother; Hyperlipidemia in her father; Hypertension in her father and paternal grandmother; Irregular heart beat in her mother; Rheumatic fever in her mother; Stroke in her maternal grandmother; Ulcers in her father and paternal grandfather; Vision loss in her brother.  ROS:   Please see the history of present illness.    All other systems reviewed and are negative.  EKGs/Labs/Other Studies Reviewed:    The following studies were reviewed today: I discussed my findings with the patient at extensive length.   Recent Labs: 04/12/2018: ALT 23; BUN 11; Creatinine, Ser 0.75; Hemoglobin 13.4; Platelets 293.0; Potassium 4.3; Sodium 139; TSH 3.10  Recent Lipid Panel    Component Value Date/Time   CHOL 234 (H) 04/12/2018 1429   CHOL 219 (H) 11/17/2017 0936   TRIG (H) 04/12/2018 1429    427.0 Triglyceride is over 400; calculations on Lipids are invalid.   HDL 37.00 (L) 04/12/2018 1429   HDL 42 11/17/2017 0936   CHOLHDL 6  04/12/2018 1429   VLDL 48.0 (H) 12/21/2016 1654   LDLCALC 142 (H) 11/17/2017 0936   LDLDIRECT 158.0 04/12/2018 1429    Physical Exam:    VS:  BP 124/72 (BP Location: Right Arm, Patient Position: Sitting, Cuff Size: Normal)   Pulse 64   Ht 5\' 3"  (1.6 m)   Wt 222 lb (100.7 kg)   SpO2 99%   BMI 39.33 kg/m     Wt Readings from Last 3 Encounters:  04/21/18 222 lb (100.7 kg)  04/12/18 224 lb 6.4 oz (101.8 kg)  02/10/18 217 lb 12.5 oz (98.8 kg)     GEN: Patient is in no acute distress HEENT: Normal NECK: No JVD; No carotid bruits LYMPHATICS: No lymphadenopathy CARDIAC: S1 S2 regular, 2/6 systolic murmur at the apex. RESPIRATORY:  Clear to auscultation without rales, wheezing or rhonchi  ABDOMEN: Soft, non-tender, non-distended MUSCULOSKELETAL:  No edema; No deformity  SKIN: Warm and dry NEUROLOGIC:  Alert and oriented x 3 PSYCHIATRIC:  Normal affect    Signed, Jenean Lindau, MD  04/21/2018 3:58 PM    [facility]

## 2018-04-21 NOTE — Progress Notes (Signed)
Cardiology Office Note:    Date:  04/21/2018   ID:  Destiny Destiny [name], DOB 07/16/1950, MRN [id number]  PCP:  Mosie Lukes, MD  Cardiologist:  Jenean Lindau, MD   Referring MD: Mosie Lukes, MD    ASSESSMENT:    1. Angina pectoris ([place])   2. Mixed hyperlipidemia   3. Morbid obesity ([place])    PLAN:    In order of problems listed above:  1. Prevention stressed with the patient.  Importance of compliance with diet and medication stressed and she vocalized understanding.  Diet was discussed for dyslipidemia and obesity and risks of obesity explained and she vocalized understanding and plans to be more aggressive with dieting and weight loss. 2. Sublingual nitroglycerin prescription was sent, its protocol and 911 protocol explained and the patient vocalized understanding questions were answered to the patient's satisfaction 3. Her chest pain symptoms are very concerning.In view of the patient's symptoms, I discussed with the patient options for evaluation. Invasive and noninvasive options were given to the patient. I discussed stress testing and coronary angiography and left heart catheterization at length. Benefits, pros and cons of each approach were discussed at length. Patient had multiple questions which were answered to the patient's satisfaction. Patient opted for invasive evaluation and we will set up for coronary angiography and left heart catheterization. Further recommendations will be made based on the findings with coronary angiography. In the interim if the patient has any significant symptoms in hospital to the nearest emergency room.    Medication Adjustments/Labs and Tests Ordered: Current medicines are reviewed at length with the patient today.  Concerns regarding medicines are outlined above.  No orders of the defined types were placed in this encounter.  No orders of the defined types were placed in this encounter.    History of Present Illness:    Destiny Destiny [name]  [place] is Destiny [name] 67 y.o. female who is being seen today for the evaluation of chest discomfort on exertion and stress at the request of Mosie Lukes, MD.  Patient is Destiny [name] pleasant 67 year old female.  She has past medical history of essential hypertension and morbid obesity.  She leads Destiny [name] sedentary lifestyle she mentions to me that she is very lax with her diet.  She is referred here because recently she has been noticing chest tightness whenever she is stressed.  She does not exercise much because of plantar fasciitis but whenever she is stressed up she notices like Destiny [name] tremendous amount of pressure on her chest going to the neck.  No radiations to the arm.  This has her concerned and she is here for that evaluation.  Her husband is accompanying her and is very supportive.  At the time of my evaluation, the patient is alert awake oriented and in no distress.  Past Medical History:  Diagnosis Date  . Abnormal blood chemistry   . Arthropathy   . Back pain   . Breast cyst   . Cellulitis   . Chest discomfort   . Constipation   . Disturbance of skin sensation   . Edema   . Encounter for screening and preventative care 06/17/2016  . GERD (gastroesophageal reflux disease)   . Hiatal hernia with gastroesophageal reflux 06/17/2016  . High risk medication use   . Hoarseness 06/15/2016  . Hyperglycemia   . Hyperglycemia 12/21/2016  . Hyperlipidemia   . Hyperlipidemia, mixed 06/15/2016  . Hypertension   . Hypopotassemia   . Hypothyroidism   . Joint pain   .  Menopause   . Obesity   . Palpitations   . Positive ANA (antinuclear antibody)   . Shoulder pain, right   . Sleep apnea   . SOB (shortness of breath)   . Thyroid disease 12/11/2014  . Thyroid nodule   . Urinary incontinence 04/01/2017  . Vitamin D deficiency   . Vitamin D deficiency     Past Surgical History:  Procedure Laterality Date  . BREAST CYST ASPIRATION Left    30 years ago  1970's or 1980's  . LIPOSUCTION  1990   over gluteal area   .  WISDOM TOOTH EXTRACTION     and all upper teeth    Current Medications: Current Meds  Medication Sig  . COD LIVER OIL PO Take by mouth. Reported on 07/16/2015  . Cyanocobalamin (VITAMIN B-12) 5000 MCG TBDP Take 5,000 mcg by mouth every 30 (thirty) days.   . irbesartan (AVAPRO) 150 MG tablet Take 1 tablet (150 mg total) by mouth daily.  Marland Kitchen itraconazole (SPORANOX) 100 MG capsule Take 200 mg by mouth 2 (two) times daily.  Marland Kitchen levothyroxine (SYNTHROID, LEVOTHROID) 25 MCG tablet Take 1 tablet (25 mcg total) by mouth daily before breakfast.  . metFORMIN (GLUCOPHAGE) 500 MG tablet Take 1 tablet (500 mg total) by mouth daily with breakfast.  . metoprolol succinate (TOPROL-XL) 25 MG 24 hr tablet Take 1 tablet (25 mg total) by mouth daily.  . nitroGLYCERIN (NITROSTAT) 0.4 MG SL tablet Place 1 tablet (0.4 mg total) under the tongue every 5 (five) minutes as needed for chest pain.  . Nutritional Supplements (QUINOA KALE & HEMP PO) Take by mouth.  . Probiotic Product (PROBIOTIC-10) CAPS Take 1 capsule by mouth.   . RED YEAST RICE EXTRACT PO Take 1 capsule by mouth daily. Reported on 07/16/2015  . Vitamin D, Ergocalciferol, (DRISDOL) 50000 units CAPS capsule TAKE 1 CAPSULE BY MOUTH EVERY 7 DAYS  . Wheat Dextrin (BENEFIBER DRINK MIX PO) Take 1 capsule by mouth daily.      Allergies:   Diclofenac; Hydrochlorothiazide; and Statins   Social History   Socioeconomic History  . Marital status: Married    Spouse name: Jovonne Wilton  . Number of children: Not on file  . Years of education: Not on file  . Highest education level: Not on file  Occupational History  . Occupation: homemaker  Social Needs  . Financial resource strain: Not on file  . Food insecurity:    Worry: Not on file    Inability: Not on file  . Transportation needs:    Medical: Not on file    Non-medical: Not on file  Tobacco Use  . Smoking status: Former Smoker    Packs/day: 1.00    Years: 27.00    Pack years: 27.00    Types:  Cigarettes    Last attempt to quit: 2011    Years since quitting: 8.8  . Smokeless tobacco: Never Used  Substance and Sexual Activity  . Alcohol use: Yes    Alcohol/week: 0.0 standard drinks  . Drug use: No  . Sexual activity: Not Currently    Birth control/protection: Post-menopausal    Comment: menopause around age [age]  Lifestyle  . Physical activity:    Days per week: Not on file    Minutes per session: Not on file  . Stress: Not on file  Relationships  . Social connections:    Talks on phone: Not on file    Gets together: Not on file    Attends  religious service: Not on file    Active member of club or organization: Not on file    Attends meetings of clubs or organizations: Not on file    Relationship status: Not on file  Other Topics Concern  . Not on file  Social History Narrative   Occasional alcohol use: hard liquor, wine and beer     Family History: The patient's family history includes Diabetes in her maternal grandmother; Heart disease in her mother; Hyperlipidemia in her father; Hypertension in her father and paternal grandmother; Irregular heart beat in her mother; Rheumatic fever in her mother; Stroke in her maternal grandmother; Ulcers in her father and paternal grandfather; Vision loss in her brother.  ROS:   Please see the history of present illness.    All other systems reviewed and are negative.  EKGs/Labs/Other Studies Reviewed:    The following studies were reviewed today: I discussed my findings with the patient at extensive length.   Recent Labs: 04/12/2018: ALT 23; BUN 11; Creatinine, Ser 0.75; Hemoglobin 13.4; Platelets 293.0; Potassium 4.3; Sodium 139; TSH 3.10  Recent Lipid Panel    Component Value Date/Time   CHOL 234 (H) 04/12/2018 1429   CHOL 219 (H) 11/17/2017 0936   TRIG (H) 04/12/2018 1429    427.0 Triglyceride is over 400; calculations on Lipids are invalid.   HDL 37.00 (L) 04/12/2018 1429   HDL 42 11/17/2017 0936   CHOLHDL 6  04/12/2018 1429   VLDL 48.0 (H) 12/21/2016 1654   LDLCALC 142 (H) 11/17/2017 0936   LDLDIRECT 158.0 04/12/2018 1429    Physical Exam:    VS:  BP 124/72 (BP Location: Right Arm, Patient Position: Sitting, Cuff Size: Normal)   Pulse 64   Ht 5\' 3"  (1.6 m)   Wt 222 lb (100.7 kg)   SpO2 99%   BMI 39.33 kg/m     Wt Readings from Last 3 Encounters:  04/21/18 222 lb (100.7 kg)  04/12/18 224 lb 6.4 oz (101.8 kg)  02/10/18 217 lb 12.5 oz (98.8 kg)     GEN: Patient is in no acute distress HEENT: Normal NECK: No JVD; No carotid bruits LYMPHATICS: No lymphadenopathy CARDIAC: S1 S2 regular, 2/6 systolic murmur at the apex. RESPIRATORY:  Clear to auscultation without rales, wheezing or rhonchi  ABDOMEN: Soft, non-tender, non-distended MUSCULOSKELETAL:  No edema; No deformity  SKIN: Warm and dry NEUROLOGIC:  Alert and oriented x 3 PSYCHIATRIC:  Normal affect    Signed, Jenean Lindau, MD  04/21/2018 3:58 PM     [facility]

## 2018-04-21 NOTE — Patient Instructions (Addendum)
Medication Instructions:  Your physician has recommended you make the following change in your medication:   START 81 mg enteric coated aspirin one time the morning of the heart cath.  If you need a refill on your cardiac medications before your next appointment, please call your pharmacy.   Lab work: None needed today.  If you have labs (blood work) drawn today and your tests are completely normal, you will receive your results only by: Marland Kitchen MyChart Message (if you have MyChart) OR . A paper copy in the mail If you have any lab test that is abnormal or we need to change your treatment, we will call you to review the results.  Testing/Procedures: A chest x-ray takes a picture of the organs and structures inside the chest, including the heart, lungs, and blood vessels. This test can show several things, including, whether the heart is enlarges; whether fluid is building up in the lungs; and whether pacemaker / defibrillator leads are still in place.     [place] [place] [address] [place] [place], [place] [address] Dept: [phone] Loc: [phone]  Destiny [name]  04/21/2018  You are scheduled for a Cardiac Catheterization on Monday, November 18 with Dr. Larae Grooms.  1. Please arrive at the [facility] (Main Entrance A) at [facility]: [place], [place] [address] at 5:00 AM (This time is two hours before your procedure to ensure your preparation). Free valet parking service is available.   Special note: Every effort is made to have your procedure done on time. Please understand that emergencies sometimes delay scheduled procedures.  2. Diet: Do not eat solid foods after midnight.  The patient may have clear liquids until 5am upon the day of the procedure.  3. Labs: none needed.  4. Medication instructions in preparation for your procedure:   Contrast Allergy: No  Do  not take Diabetes Med Glucophage (Metformin) on the day of the procedure and HOLD 48 HOURS AFTER THE PROCEDURE.  On the morning of your procedure, take your Aspirin and any morning medicines NOT listed above.  You may use sips of water.  5. Plan for one night stay--bring personal belongings. 6. Bring a current list of your medications and current insurance cards. 7. You MUST have a responsible person to drive you home. 8. Someone MUST be with you the first 24 hours after you arrive home or your discharge will be delayed. 9. Please wear clothes that are easy to get on and off and wear slip-on shoes.  Thank you for allowing [place] to care for you!   -- [facility]   Follow-Up: At [facility], you and your health needs are our priority.  As part of our continuing mission to provide you with exceptional heart care, we have created designated Provider Care Teams.  These Care Teams include your primary Cardiologist (physician) and Advanced Practice Providers (APPs -  Physician Assistants and Nurse Practitioners) who all work together to provide you with the care you need, when you need it.  You will need a follow up appointment in 4 weeks.  Please call our office 2 months in advance to schedule this appointment.  You may see another member of our [employer] Provider Team in [place]: Jenne Campus, MD . Shirlee More, MD  Any Other Special Instructions Will Be Listed Below (If Applicable).   Coronary Angiogram With Stent Coronary angiogram  with stent placement is a procedure to widen or open a narrow blood vessel of the heart (coronary artery). Arteries may become blocked by cholesterol buildup (plaques) in the lining of the wall. When a coronary artery becomes partially blocked, blood flow to that area decreases. This may lead to chest pain or a heart attack (myocardial infarction). A stent is a small piece of metal that looks like mesh or a spring. Stent  placement may be done as treatment for a heart attack or right after a coronary angiogram in which a blocked artery is found. Let your health care provider know about:  Any allergies you have.  All medicines you are taking, including vitamins, herbs, eye drops, creams, and over-the-counter medicines.  Any problems you or family members have had with anesthetic medicines.  Any blood disorders you have.  Any surgeries you have had.  Any medical conditions you have.  Whether you are pregnant or may be pregnant. What are the risks? Generally, this is a safe procedure. However, problems may occur, including:  Damage to the heart or its blood vessels.  A return of blockage.  Bleeding, infection, or bruising at the insertion site.  A collection of blood under the skin (hematoma) at the insertion site.  A blood clot in another part of the body.  Kidney injury.  Allergic reaction to the dye or contrast that is used.  Bleeding into the abdomen (retroperitoneal bleeding).  What happens before the procedure? Staying hydrated Follow instructions from your health care provider about hydration, which may include:  Up to 2 hours before the procedure - you may continue to drink clear liquids, such as water, clear fruit juice, black coffee, and plain tea.  Eating and drinking restrictions Follow instructions from your health care provider about eating and drinking, which may include:  8 hours before the procedure - stop eating heavy meals or foods such as meat, fried foods, or fatty foods.  6 hours before the procedure - stop eating light meals or foods, such as toast or cereal.  2 hours before the procedure - stop drinking clear liquids.  Ask your health care provider about:  Changing or stopping your regular medicines. This is especially important if you are taking diabetes medicines or blood thinners.  Taking medicines such as ibuprofen. These medicines can thin your blood. Do  not take these medicines before your procedure if your health care provider instructs you not to. Generally, aspirin is recommended before a procedure of passing a small, thin tube (catheter) through a blood vessel and into the heart (cardiac catheterization).  What happens during the procedure?  An IV tube will be inserted into one of your veins.  You will be given one or more of the following: ? A medicine to help you relax (sedative). ? A medicine to numb the area where the catheter will be inserted into an artery (local anesthetic).  To reduce your risk of infection: ? Your health care team will wash or sanitize their hands. ? Your skin will be washed with soap. ? Hair may be removed from the area where the catheter will be inserted.  Using a guide wire, the catheter will be inserted into an artery. The location may be in your groin, in your wrist, or in the fold of your arm (near your elbow).  A type of X-ray (fluoroscopy) will be used to help guide the catheter to the opening of the arteries in the heart.  A dye will be injected  into the catheter, and X-rays will be taken. The dye will help to show where any narrowing or blockages are located in the arteries.  A tiny wire will be guided to the blocked spot, and a balloon will be inflated to make the artery wider.  The stent will be expanded and will crush the plaques into the wall of the vessel. The stent will hold the area open and improve the blood flow. Most stents have a drug coating to reduce the risk of the stent narrowing over time.  The artery may be made wider using a drill, laser, or other tools to remove plaques.  When the blood flow is better, the catheter will be removed. The lining of the artery will grow over the stent, which stays where it was placed. This procedure may vary among health care providers and hospitals. What happens after the procedure?  If the procedure is done through the leg, you will be kept in  bed lying flat for about 6 hours. You will be instructed to not bend and not cross your legs.  The insertion site will be checked frequently.  The pulse in your foot or wrist will be checked frequently.  You may have additional blood tests, X-rays, and a test that records the electrical activity of your heart (electrocardiogram, or ECG). This information is not intended to replace advice given to you by your health care provider. Make sure you discuss any questions you have with your health care provider. Document Released: 11/29/2002 Document Revised: 01/23/2016 Document Reviewed: 12/29/2015 Elsevier Interactive Patient Education  [employer].

## 2018-04-25 ENCOUNTER — Encounter (HOSPITAL_COMMUNITY)
Admission: RE | Disposition: A | Discharge status: Home / Self Care | Payer: Self-pay | Source: Ambulatory Visit | Attending: Interventional Cardiology

## 2018-04-25 ENCOUNTER — Emergency Department (HOSPITAL_COMMUNITY)
Admission: EM | Admit: 2018-04-25 | Discharge: 2018-04-25 | Disposition: A | Discharge status: Home / Self Care | Payer: Medicare Other | Source: Home / Self Care | Attending: Emergency Medicine | Admitting: Emergency Medicine

## 2018-04-25 ENCOUNTER — Encounter (HOSPITAL_COMMUNITY): Payer: Self-pay

## 2018-04-25 ENCOUNTER — Ambulatory Visit (HOSPITAL_COMMUNITY)
Admission: RE | Admit: 2018-04-25 | Discharge: 2018-04-25 | Disposition: A | Discharge status: Home / Self Care | Payer: Medicare Other | Source: Ambulatory Visit | Attending: Interventional Cardiology | Admitting: Interventional Cardiology

## 2018-04-25 DIAGNOSIS — Z9889 Other specified postprocedural states: Secondary | ICD-10-CM

## 2018-04-25 DIAGNOSIS — I209 Angina pectoris, unspecified: Secondary | ICD-10-CM | POA: Diagnosis present

## 2018-04-25 DIAGNOSIS — Z8249 Family history of ischemic heart disease and other diseases of the circulatory system: Secondary | ICD-10-CM | POA: Insufficient documentation

## 2018-04-25 DIAGNOSIS — K219 Gastro-esophageal reflux disease without esophagitis: Secondary | ICD-10-CM | POA: Diagnosis not present

## 2018-04-25 DIAGNOSIS — Z87891 Personal history of nicotine dependence: Secondary | ICD-10-CM | POA: Diagnosis not present

## 2018-04-25 DIAGNOSIS — E039 Hypothyroidism, unspecified: Secondary | ICD-10-CM | POA: Insufficient documentation

## 2018-04-25 DIAGNOSIS — I10 Essential (primary) hypertension: Secondary | ICD-10-CM | POA: Diagnosis not present

## 2018-04-25 DIAGNOSIS — R0602 Shortness of breath: Secondary | ICD-10-CM | POA: Insufficient documentation

## 2018-04-25 DIAGNOSIS — Z4800 Encounter for change or removal of nonsurgical wound dressing: Secondary | ICD-10-CM | POA: Diagnosis not present

## 2018-04-25 DIAGNOSIS — Z7984 Long term (current) use of oral hypoglycemic drugs: Secondary | ICD-10-CM | POA: Insufficient documentation

## 2018-04-25 DIAGNOSIS — Z888 Allergy status to other drugs, medicaments and biological substances status: Secondary | ICD-10-CM | POA: Insufficient documentation

## 2018-04-25 DIAGNOSIS — E782 Mixed hyperlipidemia: Secondary | ICD-10-CM

## 2018-04-25 DIAGNOSIS — I2584 Coronary atherosclerosis due to calcified coronary lesion: Secondary | ICD-10-CM | POA: Insufficient documentation

## 2018-04-25 DIAGNOSIS — Z6839 Body mass index (BMI) 39.0-39.9, adult: Secondary | ICD-10-CM | POA: Insufficient documentation

## 2018-04-25 DIAGNOSIS — Z79890 Hormone replacement therapy (postmenopausal): Secondary | ICD-10-CM | POA: Diagnosis not present

## 2018-04-25 DIAGNOSIS — G4730 Sleep apnea, unspecified: Secondary | ICD-10-CM | POA: Insufficient documentation

## 2018-04-25 DIAGNOSIS — I25119 Atherosclerotic heart disease of native coronary artery with unspecified angina pectoris: Secondary | ICD-10-CM | POA: Diagnosis not present

## 2018-04-25 DIAGNOSIS — R739 Hyperglycemia, unspecified: Secondary | ICD-10-CM | POA: Insufficient documentation

## 2018-04-25 DIAGNOSIS — Z79899 Other long term (current) drug therapy: Secondary | ICD-10-CM | POA: Diagnosis not present

## 2018-04-25 DIAGNOSIS — R609 Edema, unspecified: Secondary | ICD-10-CM | POA: Diagnosis not present

## 2018-04-25 DIAGNOSIS — Z823 Family history of stroke: Secondary | ICD-10-CM | POA: Insufficient documentation

## 2018-04-25 DIAGNOSIS — Z9861 Coronary angioplasty status: Secondary | ICD-10-CM | POA: Diagnosis not present

## 2018-04-25 DIAGNOSIS — E559 Vitamin D deficiency, unspecified: Secondary | ICD-10-CM | POA: Diagnosis not present

## 2018-04-25 DIAGNOSIS — R7303 Prediabetes: Secondary | ICD-10-CM

## 2018-04-25 DIAGNOSIS — Z5189 Encounter for other specified aftercare: Secondary | ICD-10-CM

## 2018-04-25 HISTORY — PX: LEFT HEART CATH AND CORONARY ANGIOGRAPHY: CATH118249

## 2018-04-25 HISTORY — PX: INTRAVASCULAR PRESSURE WIRE/FFR STUDY: CATH118243

## 2018-04-25 LAB — GLUCOSE, CAPILLARY: GLUCOSE-CAPILLARY: 108 mg/dL — AB (ref 70–99)

## 2018-04-25 LAB — POCT ACTIVATED CLOTTING TIME: Activated Clotting Time: 263 seconds

## 2018-04-25 SURGERY — LEFT HEART CATH AND CORONARY ANGIOGRAPHY
Anesthesia: LOCAL

## 2018-04-25 MED ORDER — ACETAMINOPHEN 325 MG PO TABS: 650.0000 mg | ORAL_TABLET | ORAL | Status: DC | PRN

## 2018-04-25 MED ORDER — SODIUM CHLORIDE 0.9% FLUSH: 3.0000 mL | INTRAVENOUS | Status: DC | PRN

## 2018-04-25 MED ORDER — SODIUM CHLORIDE 0.9 % IV SOLN: 250.0000 mL | INTRAVENOUS | Status: DC | PRN

## 2018-04-25 MED ORDER — SODIUM CHLORIDE 0.9% FLUSH: 3.0000 mL | Freq: Two times a day (BID) | INTRAVENOUS | Status: DC

## 2018-04-25 MED ORDER — METFORMIN HCL 500 MG PO TABS: 500.0000 mg | ORAL_TABLET | Freq: Every day | ORAL | 1 refills | Status: DC

## 2018-04-25 MED ORDER — ASPIRIN 81 MG PO CHEW: 81.0000 mg | CHEWABLE_TABLET | ORAL | Status: DC

## 2018-04-25 MED ORDER — FENTANYL CITRATE (PF) 100 MCG/2ML IJ SOLN
INTRAMUSCULAR | Status: DC | PRN
  Administered 2018-04-25: 25 ug via INTRAVENOUS

## 2018-04-25 MED ORDER — MIDAZOLAM HCL 2 MG/2ML IJ SOLN
INTRAMUSCULAR | Status: AC
  Filled 2018-04-25: qty 2

## 2018-04-25 MED ORDER — LIDOCAINE HCL (PF) 1 % IJ SOLN
INTRAMUSCULAR | Status: AC
  Filled 2018-04-25: qty 30

## 2018-04-25 MED ORDER — IOHEXOL 350 MG/ML SOLN
INTRAVENOUS | Status: DC | PRN
  Administered 2018-04-25: 80 mL

## 2018-04-25 MED ORDER — MIDAZOLAM HCL 2 MG/2ML IJ SOLN
INTRAMUSCULAR | Status: DC | PRN
  Administered 2018-04-25: 2 mg via INTRAVENOUS

## 2018-04-25 MED ORDER — HEPARIN SODIUM (PORCINE) 1000 UNIT/ML IJ SOLN
INTRAMUSCULAR | Status: DC | PRN
  Administered 2018-04-25: 5000 [IU] via INTRAVENOUS
  Administered 2018-04-25: 6000 [IU] via INTRAVENOUS

## 2018-04-25 MED ORDER — ONDANSETRON HCL 4 MG/2ML IJ SOLN: 4.0000 mg | Freq: Four times a day (QID) | INTRAMUSCULAR | Status: DC | PRN

## 2018-04-25 MED ORDER — ADENOSINE 12 MG/4ML IV SOLN
INTRAVENOUS | Status: AC
  Filled 2018-04-25: qty 4

## 2018-04-25 MED ORDER — LIDOCAINE HCL (PF) 1 % IJ SOLN
INTRAMUSCULAR | Status: DC | PRN
  Administered 2018-04-25: 2 mL

## 2018-04-25 MED ORDER — SODIUM CHLORIDE 0.9 % IV SOLN: INTRAVENOUS | Status: AC

## 2018-04-25 MED ORDER — SODIUM CHLORIDE 0.9 % WEIGHT BASED INFUSION
3.0000 mL/kg/h | INTRAVENOUS | Status: AC
  Administered 2018-04-25: 3 mL/kg/h via INTRAVENOUS

## 2018-04-25 MED ORDER — VERAPAMIL HCL 2.5 MG/ML IV SOLN
INTRAVENOUS | Status: AC
  Filled 2018-04-25: qty 2

## 2018-04-25 MED ORDER — ADENOSINE (DIAGNOSTIC) 140MCG/KG/MIN
INTRAVENOUS | Status: DC | PRN
  Administered 2018-04-25: 140 ug/kg/min via INTRAVENOUS

## 2018-04-25 MED ORDER — SODIUM CHLORIDE 0.9 % WEIGHT BASED INFUSION: 1.0000 mL/kg/h | INTRAVENOUS | Status: DC

## 2018-04-25 MED ORDER — HEPARIN (PORCINE) IN NACL 1000-0.9 UT/500ML-% IV SOLN
INTRAVENOUS | Status: AC
  Filled 2018-04-25: qty 1000

## 2018-04-25 MED ORDER — HEPARIN (PORCINE) IN NACL 1000-0.9 UT/500ML-% IV SOLN
INTRAVENOUS | Status: DC | PRN
  Administered 2018-04-25 (×2): 500 mL

## 2018-04-25 MED ORDER — FENTANYL CITRATE (PF) 100 MCG/2ML IJ SOLN
INTRAMUSCULAR | Status: AC
  Filled 2018-04-25: qty 2

## 2018-04-25 MED ORDER — VERAPAMIL HCL 2.5 MG/ML IV SOLN
INTRAVENOUS | Status: DC | PRN
  Administered 2018-04-25: 10 mL via INTRA_ARTERIAL

## 2018-04-25 SURGICAL SUPPLY — 13 items
CATH 5FR JL3.5 JR4 ANG PIG MP (CATHETERS) ×2 IMPLANT
CATH LAUNCHER 6FR EBU 3 (CATHETERS) ×2 IMPLANT
DEVICE RAD COMP TR BAND LRG (VASCULAR PRODUCTS) ×2 IMPLANT
GLIDESHEATH SLEND SS 6F .021 (SHEATH) ×2 IMPLANT
GUIDEWIRE INQWIRE 1.5J.035X260 (WIRE) ×1 IMPLANT
GUIDEWIRE PRESSURE COMET II (WIRE) ×2 IMPLANT
INQWIRE 1.5J .035X260CM (WIRE) ×2
KIT HEART LEFT (KITS) ×2 IMPLANT
KIT HEMO VALVE WATCHDOG (MISCELLANEOUS) ×2 IMPLANT
PACK CARDIAC CATHETERIZATION (CUSTOM PROCEDURE TRAY) ×2 IMPLANT
SHEATH PROBE COVER 6X72 (BAG) ×2 IMPLANT
TRANSDUCER W/STOPCOCK (MISCELLANEOUS) ×2 IMPLANT
TUBING CIL FLEX 10 FLL-RA (TUBING) ×2 IMPLANT

## 2018-04-25 NOTE — Discharge Instructions (Signed)
You can alternate 600 mg of ibuprofen and [phone] mg of Tylenol every 3-4 hours as needed for pain. Do not exceed 4000 mg of Tylenol daily.  Take ibuprofen with food to avoid upset stomach issues.  You can apply ice and elevate to help with swelling and pain.  You can also use the shoulder sling for comfort but make sure to take your arm out of it and do some gentle stretching and range of motion exercises of the arm to avoid muscle stiffness.  Call your cardiologist tomorrow morning to obtain further instructions and possibly set up an appointment for follow-up.  Return to the emergency department immediately for any concerning signs or symptoms develop such as worsening swelling, redness of the skin, high fevers, or abnormal drainage.

## 2018-04-25 NOTE — Progress Notes (Signed)
Pt needs her med rec reconciled but they are not available until after 7 when pt will be in procedure. Note place on chart,

## 2018-04-25 NOTE — Interval H&P Note (Signed)
Cath Lab Visit (complete for each Cath Lab visit)  Clinical Evaluation Leading to the Procedure:   ACS: No  Non-ACS:    Anginal Classification: CCS III  Anti-ischemic medical therapy: Minimal Therapy (1 class of medications)  Non-Invasive Test Results: No non-invasive testing performed  Prior CABG: No previous CABG      History and Physical Interval Note:  04/25/2018 7:27 AM  Destiny [name]  has presented today for surgery, with the diagnosis of angina  The various methods of treatment have been discussed with the patient and family. After consideration of risks, benefits and other options for treatment, the patient has consented to  Procedure(s): LEFT HEART CATH AND CORONARY ANGIOGRAPHY (N/A) as a surgical intervention .  The patient's history has been reviewed, patient examined, no change in status, stable for surgery.  I have reviewed the patient's chart and labs.  Questions were answered to the patient's satisfaction.     Larae Grooms

## 2018-04-25 NOTE — ED Triage Notes (Signed)
Pt states that she had a cath today and woke up from a nap with a hematoma to the L wrist, some swelling and redness noted.

## 2018-04-25 NOTE — Progress Notes (Signed)
Pt called cath lab at approximately 6:52 describing what sounds like a hematoma at the right radial site from her cardiac cath this morning. Pt stated that she had been discharged around 1 and that she had a hematoma while in Short Stay but it was resolved and she was instructed to call the hospital if anything changed. Pt was instructed to return to [facility] ED multiple times but voiced that she would like to go to a closer facility which would be [facility]. Pt stated that she was "doped up" and she was confused on the facility closest to her. Pt was again instructed to have someone bring her to [facility] ED before she agreed to come in

## 2018-04-25 NOTE — ED Provider Notes (Signed)
[facility] EMERGENCY DEPARTMENT Provider Note   CSN: [id number] Arrival date & time: 04/25/18  1959     History   Chief Complaint Chief Complaint  Patient presents with  . Post-op Problem    HPI Destiny [name] is a 67 y.o. female with history of GERD, hyperlipidemia, thyroid disease, hypertension and presents for evaluation of acute onset, progressively improving pain and hematoma.  She underwent left heart cath today with access to the right radial artery this morning which she tolerated without difficulty.  She states that she was then discharged and went home and went to sleep.  She states when she awoke at around 6:30 PM she noticed erythema and burning sensation to the cath site extending up to the right upper extremity.  She states that this has since improved.  She did call the Cath Lab and was instructed to present to the ED for further evaluation.  She denies any significant pain at this time.  Denies fevers, chest pain, shortness of breath, nausea, or vomiting.  No medications for this prior to arrival.  The history is provided by the patient.    Past Medical History:  Diagnosis Date  . Abnormal blood chemistry   . Arthropathy   . Back pain   . Breast cyst   . Cellulitis   . Chest discomfort   . Constipation   . Disturbance of skin sensation   . Edema   . Encounter for screening and preventative care 06/17/2016  . GERD (gastroesophageal reflux disease)   . Hiatal hernia with gastroesophageal reflux 06/17/2016  . High risk medication use   . Hoarseness 06/15/2016  . Hyperglycemia   . Hyperglycemia 12/21/2016  . Hyperlipidemia   . Hyperlipidemia, mixed 06/15/2016  . Hypertension   . Hypopotassemia   . Hypothyroidism   . Joint pain   . Menopause   . Obesity   . Palpitations   . Positive ANA (antinuclear antibody)   . Shoulder pain, right   . Sleep apnea   . SOB (shortness of breath)   . Thyroid disease 12/11/2014  . Thyroid nodule   . Urinary  incontinence 04/01/2017  . Vitamin D deficiency   . Vitamin D deficiency     Patient Active Problem List   Diagnosis Date Noted  . Angina pectoris ([place]) 04/21/2018  . Endometrium, hyperplasia 04/13/2018  . Vitamin B12 deficiency 04/12/2018  . Arthritis 04/12/2018  . Constipation 02/10/2018  . Abdominal pain 02/10/2018  . Atypical chest pain 02/10/2018  . Dyspepsia 02/10/2018  . Urinary incontinence 04/01/2017  . Insulin resistance 03/10/2017  . Morbid obesity ([place]) 01/25/2017  . Mixed hyperlipidemia 01/07/2017  . Vitamin D deficiency 01/07/2017  . Prediabetes 01/07/2017  . Plantar fasciitis 09/02/2016  . Encounter for screening and preventative care 06/17/2016  . Hiatal hernia with gastroesophageal reflux 06/17/2016  . Hoarseness 06/15/2016  . DDD (degenerative disc disease), cervical 05/04/2016  . Foraminal stenosis of cervical region 05/04/2016  . Neck pain 05/04/2016  . Numbness and tingling in both hands 05/04/2016  . Essential hypertension 03/26/2016  . Hypothyroidism 12/11/2014    Past Surgical History:  Procedure Laterality Date  . BREAST CYST ASPIRATION Left    30 years ago  1970's or 1980's  . INTRAVASCULAR PRESSURE WIRE/FFR STUDY N/A 04/25/2018   Procedure: INTRAVASCULAR PRESSURE WIRE/FFR STUDY;  Surgeon: Jettie Booze, MD;  Location: [facility];  Service: Cardiovascular;  Laterality: N/A;  . LEFT HEART CATH AND CORONARY ANGIOGRAPHY N/A 04/25/2018  Procedure: LEFT HEART CATH AND CORONARY ANGIOGRAPHY;  Surgeon: Jettie Booze, MD;  Location: [facility];  Service: Cardiovascular;  Laterality: N/A;  . LIPOSUCTION  1990   over gluteal area   . WISDOM TOOTH EXTRACTION     and all upper teeth     OB History    Gravida  1   Para  0   Term      Preterm      AB  1   Living        SAB  1   TAB      Ectopic      Multiple      Live Births               Home Medications    Prior to Admission medications     Medication Sig Start Date End Date Taking? Authorizing Provider  COD LIVER OIL PO Take by mouth. Reported on 07/16/2015    [provider]  Cyanocobalamin (VITAMIN B-12) 5000 MCG TBDP Take 5,000 mcg by mouth every 30 (thirty) days.     [provider]  irbesartan (AVAPRO) 150 MG tablet Take 1 tablet (150 mg total) by mouth daily. 04/12/18   Mosie Lukes, MD  itraconazole (SPORANOX) 100 MG capsule Take 200 mg by mouth 2 (two) times daily.    [provider]  levothyroxine (SYNTHROID, LEVOTHROID) 25 MCG tablet Take 1 tablet (25 mcg total) by mouth daily before breakfast. 04/14/18   Mosie Lukes, MD  metFORMIN (GLUCOPHAGE) 500 MG tablet Take 1 tablet (500 mg total) by mouth daily with breakfast. 04/27/18   Jettie Booze, MD  metoprolol succinate (TOPROL-XL) 25 MG 24 hr tablet Take 1 tablet (25 mg total) by mouth daily. 04/12/18   Mosie Lukes, MD  nitroGLYCERIN (NITROSTAT) 0.4 MG SL tablet Place 1 tablet (0.4 mg total) under the tongue every 5 (five) minutes as needed for chest pain. 02/10/18   Mosie Lukes, MD  Nutritional Supplements (QUINOA KALE & HEMP PO) Take by mouth.    [provider]  Probiotic Product (PROBIOTIC-10) CAPS Take 1 capsule by mouth.     [provider]  RED YEAST RICE EXTRACT PO Take 1 capsule by mouth daily. Reported on 07/16/2015    [provider]  Vitamin D, Ergocalciferol, (DRISDOL) 50000 units CAPS capsule TAKE 1 CAPSULE BY MOUTH EVERY 7 DAYS 02/10/18   Mosie Lukes, MD  Wheat Dextrin (BENEFIBER DRINK MIX PO) Take 1 capsule by mouth daily.     [provider]    Family History Family History  Problem Relation Age of Onset  . Irregular heart beat Mother   . Heart disease Mother   . Rheumatic fever Mother   . Hypertension Father   . Ulcers Father   . Hyperlipidemia Father   . Vision loss Brother   . Stroke Maternal Grandmother   . Diabetes Maternal Grandmother   . Hypertension Paternal  Grandmother   . Ulcers Paternal Grandfather     Social History Social History   Tobacco Use  . Smoking status: Former Smoker    Packs/day: 1.00    Years: 27.00    Pack years: 27.00    Types: Cigarettes    Last attempt to quit: 2011    Years since quitting: 8.8  . Smokeless tobacco: Never Used  Substance Use Topics  . Alcohol use: Yes    Alcohol/week: 0.0 standard drinks  . Drug use: No  Allergies   Diclofenac; Hydrochlorothiazide; and Statins   Review of Systems Review of Systems  Constitutional: Negative for chills and fever.  Respiratory: Negative for shortness of breath.   Cardiovascular: Negative for chest pain.  Gastrointestinal: Negative for abdominal pain, nausea and vomiting.  Skin: Positive for wound.  All other systems reviewed and are negative.    Physical Exam Updated Vital Signs BP (!) 149/79   Pulse 60   Temp 98.3 F (36.8 C) (Oral)   Resp 16   SpO2 100%   Physical Exam  Constitutional: She appears well-developed and well-nourished. No distress.  HENT:  Head: Normocephalic and atraumatic.  Eyes: Conjunctivae are normal. Right eye exhibits no discharge. Left eye exhibits no discharge.  Neck: Normal range of motion. Neck supple. No JVD present. No tracheal deviation present.  Cardiovascular: Normal rate, regular rhythm, normal heart sounds and intact distal pulses.  2+ radial bilaterally  Pulmonary/Chest: Effort normal and breath sounds normal.  Abdominal: Soft. Bowel sounds are normal. She exhibits no distension. There is no tenderness.  Musculoskeletal: She exhibits no edema.  Neurological: She is alert.  5/5 strength of BUE major muscle groups.  Sensation intact to soft touch of bilateral upper extremities.  Skin: Skin is warm and dry. No erythema.  Punctate wound to the volar aspect of the right distal forearm consistent with catheterization site.  There is some surrounding ecchymosis.  No surrounding erythema or induration. Compartments  are soft.   Psychiatric: She has a normal mood and affect. Her behavior is normal.  Nursing note and vitals reviewed.    ED Treatments / Results  Labs (all labs ordered are listed, but only abnormal results are displayed) Labs Reviewed - No data to display  EKG None  Radiology No results found.  Procedures Procedures (including critical care time)  Medications Ordered in ED Medications - No data to display   Initial Impression / Assessment and Plan / ED Course  I have reviewed the triage vital signs and the nursing notes.  Pertinent labs & imaging results that were available during my care of the patient were reviewed by me and considered in my medical decision making (see chart for details).     Patient presenting for evaluation of improved burning sensation to puncture wound status post catheterization with access through the right radial artery this morning.  Patient is afebrile, intermittently hypertensive in the ED with improvement on reevaluation.  She is nontoxic in appearance.  No evidence of secondary skin infection.  There is mild ecchymosis surrounding the wound but no significant hematoma.  Doubt thrombophlebitis, pseudoaneurysm, or infection.  Encouraged the patient to elevate and apply ice.  She will contact her cardiologist tomorrow for further recommendations.  Discussed strict ED return precautions.  Patient and patient's husband verbalized understanding of and agreement with plan and patient is stable for discharge home at this time.  Patient was seen and evaluated by Dr. Wilson Singer who agrees with assessment and plan at this time.   Final Clinical Impressions(s) / ED Diagnoses   Final diagnoses:  Status post cardiac catheterization  Visit for wound check    ED Discharge Orders    None       Renita Papa, PA-C 04/26/18 1505    Virgel Manifold, MD 04/29/18 1332

## 2018-04-25 NOTE — Discharge Instructions (Signed)

## 2018-04-26 ENCOUNTER — Encounter (HOSPITAL_COMMUNITY): Payer: Self-pay | Admitting: Interventional Cardiology

## 2018-04-27 ENCOUNTER — Telehealth: Payer: Self-pay

## 2018-04-27 NOTE — Telephone Encounter (Signed)
Patient contacted regarding discharge from hospital on 04/25/2018.  Patient understands to follow up with provider provider 4 weeks post cath. Appointment has been set. Patient understands discharge instructions? Yes, educated to put ice on the site per the ED MD instructions.  Patient will call with any concerns or questions.

## 2018-05-06 ENCOUNTER — Emergency Department (HOSPITAL_COMMUNITY)
Admission: EM | Admit: 2018-05-06 | Discharge: 2018-05-06 | Disposition: A | Discharge status: Home / Self Care | Payer: Medicare Other | Attending: Emergency Medicine | Admitting: Emergency Medicine

## 2018-05-06 ENCOUNTER — Encounter (HOSPITAL_COMMUNITY): Payer: Self-pay

## 2018-05-06 DIAGNOSIS — Y999 Unspecified external cause status: Secondary | ICD-10-CM | POA: Diagnosis not present

## 2018-05-06 DIAGNOSIS — S6991XA Unspecified injury of right wrist, hand and finger(s), initial encounter: Secondary | ICD-10-CM | POA: Diagnosis present

## 2018-05-06 DIAGNOSIS — I10 Essential (primary) hypertension: Secondary | ICD-10-CM | POA: Diagnosis not present

## 2018-05-06 DIAGNOSIS — X58XXXA Exposure to other specified factors, initial encounter: Secondary | ICD-10-CM | POA: Diagnosis not present

## 2018-05-06 DIAGNOSIS — S60211A Contusion of right wrist, initial encounter: Secondary | ICD-10-CM | POA: Insufficient documentation

## 2018-05-06 DIAGNOSIS — Y929 Unspecified place or not applicable: Secondary | ICD-10-CM | POA: Insufficient documentation

## 2018-05-06 DIAGNOSIS — Z87891 Personal history of nicotine dependence: Secondary | ICD-10-CM | POA: Diagnosis not present

## 2018-05-06 DIAGNOSIS — Y939 Activity, unspecified: Secondary | ICD-10-CM | POA: Insufficient documentation

## 2018-05-06 DIAGNOSIS — S40021A Contusion of right upper arm, initial encounter: Secondary | ICD-10-CM | POA: Diagnosis not present

## 2018-05-06 DIAGNOSIS — E039 Hypothyroidism, unspecified: Secondary | ICD-10-CM | POA: Insufficient documentation

## 2018-05-06 DIAGNOSIS — T148XXA Other injury of unspecified body region, initial encounter: Secondary | ICD-10-CM

## 2018-05-06 NOTE — ED Notes (Signed)
ED Provider at bedside. 

## 2018-05-06 NOTE — ED Provider Notes (Signed)
Emergency Department Provider Note   I have reviewed the triage vital signs and the nursing notes.   HISTORY  Chief Complaint No chief complaint on file.   HPI Destiny [name] is a 67 y.o. female had a heart catheterization approximately 10 days ago the presents to the emergency department today with persistent "knot" in her right wrist near the area of the catheterization.  She has diffuse ecchymosis around her arm as well.  She states that she has not had any fever, persistent erythema, drainage or significant pain in the area.  States that she goes asleep at night it does seem to become more prominent but the day it improves.  She does not see anybody else about the symptoms.  She is been still using compressions this time.  At the time of my evaluation she states that actually does seem to be getting better compared to a week ago. No other associated or modifying symptoms.    Past Medical History:  Diagnosis Date  . Abnormal blood chemistry   . Arthropathy   . Back pain   . Breast cyst   . Cellulitis   . Chest discomfort   . Constipation   . Disturbance of skin sensation   . Edema   . Encounter for screening and preventative care 06/17/2016  . GERD (gastroesophageal reflux disease)   . Hiatal hernia with gastroesophageal reflux 06/17/2016  . High risk medication use   . Hoarseness 06/15/2016  . Hyperglycemia   . Hyperglycemia 12/21/2016  . Hyperlipidemia   . Hyperlipidemia, mixed 06/15/2016  . Hypertension   . Hypopotassemia   . Hypothyroidism   . Joint pain   . Menopause   . Obesity   . Palpitations   . Positive ANA (antinuclear antibody)   . Shoulder pain, right   . Sleep apnea   . SOB (shortness of breath)   . Thyroid disease 12/11/2014  . Thyroid nodule   . Urinary incontinence 04/01/2017  . Vitamin D deficiency   . Vitamin D deficiency     Patient Active Problem List   Diagnosis Date Noted  . Angina pectoris ([place]) 04/21/2018  . Endometrium, hyperplasia  04/13/2018  . Vitamin B12 deficiency 04/12/2018  . Arthritis 04/12/2018  . Constipation 02/10/2018  . Abdominal pain 02/10/2018  . Atypical chest pain 02/10/2018  . Dyspepsia 02/10/2018  . Urinary incontinence 04/01/2017  . Insulin resistance 03/10/2017  . Morbid obesity ([place]) 01/25/2017  . Mixed hyperlipidemia 01/07/2017  . Vitamin D deficiency 01/07/2017  . Prediabetes 01/07/2017  . Plantar fasciitis 09/02/2016  . Encounter for screening and preventative care 06/17/2016  . Hiatal hernia with gastroesophageal reflux 06/17/2016  . Hoarseness 06/15/2016  . DDD (degenerative disc disease), cervical 05/04/2016  . Foraminal stenosis of cervical region 05/04/2016  . Neck pain 05/04/2016  . Numbness and tingling in both hands 05/04/2016  . Essential hypertension 03/26/2016  . Hypothyroidism 12/11/2014    Past Surgical History:  Procedure Laterality Date  . BREAST CYST ASPIRATION Left    30 years ago  1970's or 1980's  . INTRAVASCULAR PRESSURE WIRE/FFR STUDY N/A 04/25/2018   Procedure: INTRAVASCULAR PRESSURE WIRE/FFR STUDY;  Surgeon: Jettie Booze, MD;  Location: [facility];  Service: Cardiovascular;  Laterality: N/A;  . LEFT HEART CATH AND CORONARY ANGIOGRAPHY N/A 04/25/2018   Procedure: LEFT HEART CATH AND CORONARY ANGIOGRAPHY;  Surgeon: Jettie Booze, MD;  Location: [facility];  Service: Cardiovascular;  Laterality: N/A;  . LIPOSUCTION  1990  over gluteal area   . WISDOM TOOTH EXTRACTION     and all upper teeth    Current Outpatient Rx  . Order #: [id number] Class: Historical Med  . Order #: [id number] Class: Historical Med  . Order #: [id number] Class: Normal  . Order #: [id number] Class: Historical Med  . Order #: [id number] Class: Normal  . Order #: [id number] Class: No Print  . Order #: [id number] Class: Normal  . Order #: [id number] Class: Normal  . Order #: [id number] Class: Historical Med  . Order #: [id number] Class: Historical Med  . Order #:  [id number] Class: Historical Med  . Order #: [id number] Class: Normal  . Order #: [id number] Class: Historical Med    Allergies Diclofenac; Hydrochlorothiazide; and Statins  Family History  Problem Relation Age of Onset  . Irregular heart beat Mother   . Heart disease Mother   . Rheumatic fever Mother   . Hypertension Father   . Ulcers Father   . Hyperlipidemia Father   . Vision loss Brother   . Stroke Maternal Grandmother   . Diabetes Maternal Grandmother   . Hypertension Paternal Grandmother   . Ulcers Paternal Grandfather     Social History Social History   Tobacco Use  . Smoking status: Former Smoker    Packs/day: 1.00    Years: 27.00    Pack years: 27.00    Types: Cigarettes    Last attempt to quit: 2011    Years since quitting: 8.9  . Smokeless tobacco: Never Used  Substance Use Topics  . Alcohol use: Yes    Alcohol/week: 0.0 standard drinks  . Drug use: No    Review of Systems  All other systems negative except as documented in the HPI. All pertinent positives and negatives as reviewed in the HPI. ____________________________________________   PHYSICAL EXAM:  VITAL SIGNS: ED Triage Vitals  Enc Vitals Group     BP 05/06/18 1210 (!) 146/73     Pulse Rate 05/06/18 1210 63     Resp 05/06/18 1210 16     Temp 05/06/18 1210 97.9 F (36.6 C)     Temp Source 05/06/18 1210 Oral     SpO2 05/06/18 1210 97 %     Weight 05/06/18 1212 230 lb (104.3 kg)     Height 05/06/18 1212 5\' 3"  (1.6 m)     Head Circumference --      Peak Flow --      Pain Score 05/06/18 1211 1     Pain Loc --      Pain Edu? --      Excl. in [place]? --     Constitutional: Alert and oriented. Well appearing and in no acute distress. Eyes: Conjunctivae are normal. PERRL. EOMI. Head: Atraumatic. Nose: No congestion/rhinnorhea. Mouth/Throat: Mucous membranes are moist.  Oropharynx non-erythematous. Neck: No stridor.  No meningeal signs.   Cardiovascular: Normal rate, regular rhythm. Good  peripheral circulation. Grossly normal heart sounds.   Respiratory: Normal respiratory effort.  No retractions. Lungs CTAB. Gastrointestinal: Soft and nontender. No distention.  Musculoskeletal: No lower extremity tenderness nor edema. No gross deformities of extremities. Neurologic:  Normal speech and language. No gross focal neurologic deficits are appreciated.  Skin:  Skin is warm, dry and intact. No rash noted. Diffuse ecchymosis around her right forearm in dependent areas. Has 1.5 cm palpable indurated area around right radial artery. No erythema, fluctuance or warmth  ____________________________________________   PROCEDURES  Procedure(s) performed:   Procedures  EMERGENCY DEPARTMENT US SOFT TISSUE INTERPRETATION "Study: Limited Soft Tissue Ultrasound"  INDICATIONS: swelling  and pain around catheterization site Multiple views of the body part were obtained in real-time with a multi-frequency linear probe  PERFORMED BY: Myself IMAGES ARCHIVED?: Yes SIDE:Right  BODY PART:Upper extremity INTERPRETATION:  two fluid collections around radial artery, not communicating with radial artery, no obvious pseudoaneurysm    ____________________________________________   INITIAL IMPRESSION / ASSESSMENT AND PLAN / ED COURSE  Suspect resolving hematomas from catheterization.  Continue warm compresses at home and follow-up with cardiology next week as advised.  He does have fluid collections but without warmth, fever or erythema is low suspicion is infected at this time.  She will return if she starts having of those symptoms.     Pertinent labs & imaging results that were available during my care of the patient were reviewed by me and considered in my medical decision making (see chart for details).  ____________________________________________  FINAL CLINICAL IMPRESSION(S) / ED DIAGNOSES  Final diagnoses:  Hematoma     MEDICATIONS GIVEN DURING THIS VISIT:  Medications - No  data to display   NEW OUTPATIENT MEDICATIONS STARTED DURING THIS VISIT:  New Prescriptions   No medications on file    Note:  This note was prepared with assistance of Dragon voice recognition software. Occasional wrong-word or sound-a-like substitutions may have occurred due to the inherent limitations of voice recognition software.   Merrily Pew, MD 05/06/18 1224

## 2018-05-06 NOTE — ED Triage Notes (Signed)
Pt endorses she had a cath done on 18th and has had swelling/ knot to right lower forearm at insertion site. Some bruising present.

## 2018-05-06 NOTE — ED Notes (Signed)
Patient verbalizes understanding of discharge instructions. Opportunity for questioning and answers were provided. Armband removed by staff, pt discharged from ED. Pt ambulatory to lobby.  

## 2018-05-13 ENCOUNTER — Ambulatory Visit: Payer: Medicare Other | Admitting: Cardiology

## 2018-05-13 ENCOUNTER — Encounter: Payer: Self-pay | Admitting: Cardiology

## 2018-05-13 ENCOUNTER — Ambulatory Visit (INDEPENDENT_AMBULATORY_CARE_PROVIDER_SITE_OTHER): Payer: Medicare Other | Admitting: Cardiology

## 2018-05-13 VITALS — BP 122/76 | HR 68 | Ht 63.0 in | Wt 220.0 lb

## 2018-05-13 DIAGNOSIS — Z8249 Family history of ischemic heart disease and other diseases of the circulatory system: Secondary | ICD-10-CM | POA: Insufficient documentation

## 2018-05-13 DIAGNOSIS — I2510 Atherosclerotic heart disease of native coronary artery without angina pectoris: Secondary | ICD-10-CM | POA: Diagnosis not present

## 2018-05-13 DIAGNOSIS — I10 Essential (primary) hypertension: Secondary | ICD-10-CM | POA: Diagnosis not present

## 2018-05-13 DIAGNOSIS — E782 Mixed hyperlipidemia: Secondary | ICD-10-CM

## 2018-05-13 MED ORDER — ROSUVASTATIN CALCIUM 10 MG PO TABS: 10.0000 mg | ORAL_TABLET | Freq: Every day | ORAL | 3 refills | Status: DC

## 2018-05-13 NOTE — Patient Instructions (Signed)
Medication Instructions:  Your physician has recommended you make the following change in your medication:   START: Crestor 10 mg daily.  If you need a refill on your cardiac medications before your next appointment, please call your pharmacy.   Lab work: Your physician recommends that you return for lab work in 6 weeks: Bmp, lft, and lipids.   If you have labs (blood work) drawn today and your tests are completely normal, you will receive your results only by: Marland Kitchen MyChart Message (if you have MyChart) OR . A paper copy in the mail If you have any lab test that is abnormal or we need to change your treatment, we will call you to review the results.  Testing/Procedures: None   Follow-Up: At [facility], you and your health needs are our priority.  As part of our continuing mission to provide you with exceptional heart care, we have created designated Provider Care Teams.  These Care Teams include your primary Cardiologist (physician) and Advanced Practice Providers (APPs -  Physician Assistants and Nurse Practitioners) who all work together to provide you with the care you need, when you need it. You will need a follow up appointment in 6 months.  Please call our office 2 months in advance to schedule this appointment.  You may see No primary care provider on file. or another member of our [employer] in [place]: Jenne Campus, MD . Shirlee More, MD  Any Other Special Instructions Will Be Listed Below (If Applicable).  Rosuvastatin Tablets What is this medicine? ROSUVASTATIN (roe SOO va sta tin) is known as a HMG-CoA reductase inhibitor or 'statin'. It lowers cholesterol and triglycerides in the blood. This drug may also reduce the risk of heart attack, stroke, or other health problems in patients with risk factors for heart disease. Diet and lifestyle changes are often used with this drug. This medicine may be used for other purposes; ask your health care provider  or pharmacist if you have questions. COMMON BRAND NAME(S): Crestor What should I tell my health care provider before I take this medicine? They need to know if you have any of these conditions: -frequently drink alcoholic beverages -kidney disease -liver disease -muscle aches or weakness -other medical condition -an unusual or allergic reaction to rosuvastatin, other medicines, foods, dyes, or preservatives -pregnant or trying to get pregnant -breast-feeding How should I use this medicine? Take this medicine by mouth with a glass of water. Follow the directions on the prescription label. Do not cut, crush or chew this medicine. You can take this medicine with or without food. Take your doses at regular intervals. Do not take your medicine more often than directed. Talk to your pediatrician regarding the use of this medicine in children. While this drug may be prescribed for children as young as 2 years old for selected conditions, precautions do apply. Overdosage: If you think you have taken too much of this medicine contact a poison control center or emergency room at once. NOTE: This medicine is only for you. Do not share this medicine with others. What if I miss a dose? If you miss a dose, take it as soon as you can. Do not take 2 doses within 12 hours of each other. If there are less than 12 hours until your next dose, take only that dose. Do not take double or extra doses. What may interact with this medicine? Do not take this medicine with any of the following medications: -herbal medicines like  red yeast rice This medicine may also interact with the following medications: -alcohol -antacids containing aluminum hydroxide or magnesium hydroxide -cyclosporine -other medicines for high cholesterol -some medicines for HIV infection -warfarin This list may not describe all possible interactions. Give your health care provider a list of all the medicines, herbs, non-prescription drugs,  or dietary supplements you use. Also tell them if you smoke, drink alcohol, or use illegal drugs. Some items may interact with your medicine. What should I watch for while using this medicine? Visit your doctor or health care professional for regular check-ups. You may need regular tests to make sure your liver is working properly. Tell your doctor or health care professional right away if you get any unexplained muscle pain, tenderness, or weakness, especially if you also have a fever and tiredness. Your doctor or health care professional may tell you to stop taking this medicine if you develop muscle problems. If your muscle problems do not go away after stopping this medicine, contact your health care professional. This medicine may affect blood sugar levels. If you have diabetes, check with your doctor or health care professional before you change your diet or the dose of your diabetic medicine. Avoid taking antacids containing aluminum, calcium or magnesium within 2 hours of taking this medicine. This drug is only part of a total heart-health program. Your doctor or a dietician can suggest a low-cholesterol and low-fat diet to help. Avoid alcohol and smoking, and keep a proper exercise schedule. Do not use this drug if you are pregnant or breast-feeding. Serious side effects to an unborn child or to an infant are possible. Talk to your doctor or pharmacist for more information. What side effects may I notice from receiving this medicine? Side effects that you should report to your doctor or health care professional as soon as possible: -allergic reactions like skin rash, itching or hives, swelling of the face, lips, or tongue -dark urine -fever -joint pain -muscle cramps, pain -redness, blistering, peeling or loosening of the skin, including inside the mouth -trouble passing urine or change in the amount of urine -unusually weak or tired -yellowing of the eyes or skin Side effects that  usually do not require medical attention (report to your doctor or health care professional if they continue or are bothersome): -constipation -heartburn -nausea -stomach gas, pain, upset This list may not describe all possible side effects. Call your doctor for medical advice about side effects. You may report side effects to FDA at 1-800-FDA-1088. Where should I keep my medicine? Keep out of the reach of children. Store at room temperature between 20 and 25 degrees C (68 and 77 degrees F). Keep container tightly closed (protect from moisture). Throw away any unused medicine after the expiration date. NOTE: This sheet is a summary. It may not cover all possible information. If you have questions about this medicine, talk to your doctor, pharmacist, or health care provider.  2018 Elsevier/Gold Standard (2014-11-08 13:33:08)

## 2018-05-13 NOTE — Progress Notes (Signed)
Cardiology Office Note:    Date:  05/13/2018   ID:  Destiny [name], DOB 08-11-1950, MRN [id number]  PCP:  Mosie Lukes, MD  Cardiologist:  Jenean Lindau, MD   Referring MD: Mosie Lukes, MD    ASSESSMENT:    1. Coronary artery disease involving native coronary artery of native heart without angina pectoris   2. Essential hypertension   3. Mixed hyperlipidemia   4. Morbid obesity ([place])    PLAN:    In order of problems listed above:  1. Secondary prevention stressed with the patient.  Importance of compliance with diet and medication stressed and she vocalized understanding.  Her blood pressure is stable.  Diet was discussed for dyslipidemia.  She has market elevated and LDL and triglycerides.  Statin therapy was discussed.  She has tried 1 statin and is not tolerant with it.  We will try to know which one it is.  She is trying to call her doctor's office to know which one it was and we will try to see if we can navigate her with other statins to see if we can get some success.  In view of elevated triglycerides we will encourage her to take fish oil 2 g twice daily.  Diet was discussed at extensive length and risks of dyslipidemia were discussed she vocalized understanding.  Risks of obesity were expressed and weight reduction was stressed and she plans to do this.  We will start her on statin therapy and see if she tolerates it.  She will be back in 6 weeks for liver lipid profile in 6 months or earlier if she needs to see me for follow-up appointment. 2. Patient had multiple questions which were answered to her satisfaction. 3. The patient now mentions to me that it was pravastatin.  She is agreeable to start rosuvastatin 10 mg daily and back in 6 weeks for a liver lipid check.  She will take coenzyme every 10 supplementation.  If she has any issues with this medication she will give [place] a call.  Benefits and potential risks explained to her at length and she vocalized  understanding.   Medication Adjustments/Labs and Tests Ordered: Current medicines are reviewed at length with the patient today.  Concerns regarding medicines are outlined above.  No orders of the defined types were placed in this encounter.  No orders of the defined types were placed in this encounter.    No chief complaint on file.    History of Present Illness:    Destiny [name] is a 67 y.o. female.  The patient was evaluated by me for symptoms suggesting angina.  She underwent coronary angiography with no significant obstructive disease but does have coronary artery disease which is significant.  She has history of essential hypertension and is significantly obese.  She has history of dyslipidemia.  She denies any problems at this time and takes care of activities of daily living.  Again she leads a sedentary lifestyle.  Past Medical History:  Diagnosis Date  . Abnormal blood chemistry   . Arthropathy   . Back pain   . Breast cyst   . Cellulitis   . Chest discomfort   . Constipation   . Disturbance of skin sensation   . Edema   . Encounter for screening and preventative care 06/17/2016  . GERD (gastroesophageal reflux disease)   . Hiatal hernia with gastroesophageal reflux 06/17/2016  . High risk medication use   . Hoarseness 06/15/2016  .  Hyperglycemia   . Hyperglycemia 12/21/2016  . Hyperlipidemia   . Hyperlipidemia, mixed 06/15/2016  . Hypertension   . Hypopotassemia   . Hypothyroidism   . Joint pain   . Menopause   . Obesity   . Palpitations   . Positive ANA (antinuclear antibody)   . Shoulder pain, right   . Sleep apnea   . SOB (shortness of breath)   . Thyroid disease 12/11/2014  . Thyroid nodule   . Urinary incontinence 04/01/2017  . Vitamin D deficiency   . Vitamin D deficiency     Past Surgical History:  Procedure Laterality Date  . BREAST CYST ASPIRATION Left    30 years ago  1970's or 1980's  . INTRAVASCULAR PRESSURE WIRE/FFR STUDY N/A 04/25/2018    Procedure: INTRAVASCULAR PRESSURE WIRE/FFR STUDY;  Surgeon: Jettie Booze, MD;  Location: [facility];  Service: Cardiovascular;  Laterality: N/A;  . LEFT HEART CATH AND CORONARY ANGIOGRAPHY N/A 04/25/2018   Procedure: LEFT HEART CATH AND CORONARY ANGIOGRAPHY;  Surgeon: Jettie Booze, MD;  Location: [facility];  Service: Cardiovascular;  Laterality: N/A;  . LIPOSUCTION  1990   over gluteal area   . WISDOM TOOTH EXTRACTION     and all upper teeth    Current Medications: Current Meds  Medication Sig  . COD LIVER OIL PO Take 1 capsule by mouth daily. Reported on 07/16/2015  . Cyanocobalamin (VITAMIN B-12) 500 MCG LOZG Take 500 mcg by mouth daily.   . irbesartan (AVAPRO) 150 MG tablet Take 1 tablet (150 mg total) by mouth daily.  Marland Kitchen levothyroxine (SYNTHROID, LEVOTHROID) 25 MCG tablet Take 1 tablet (25 mcg total) by mouth daily before breakfast.  . metFORMIN (GLUCOPHAGE) 500 MG tablet Take 1 tablet (500 mg total) by mouth daily with breakfast.  . metoprolol succinate (TOPROL-XL) 25 MG 24 hr tablet Take 1 tablet (25 mg total) by mouth daily.  . nitroGLYCERIN (NITROSTAT) 0.4 MG SL tablet Place 1 tablet (0.4 mg total) under the tongue every 5 (five) minutes as needed for chest pain.  . Probiotic Product (PROBIOTIC-10) CAPS Take 1 capsule by mouth daily.   . RED YEAST RICE EXTRACT PO Take 1 capsule by mouth daily. Reported on 07/16/2015  . Vitamin D, Ergocalciferol, (DRISDOL) 50000 units CAPS capsule TAKE 1 CAPSULE BY MOUTH EVERY 7 DAYS  . Wheat Dextrin (BENEFIBER DRINK MIX PO) Take 1 capsule by mouth daily.      Allergies:   Diclofenac; Hydrochlorothiazide; and Statins   Social History   Socioeconomic History  . Marital status: Married    Spouse name: Kambryn Dapolito  . Number of children: Not on file  . Years of education: Not on file  . Highest education level: Not on file  Occupational History  . Occupation: homemaker  Social Needs  . Financial resource strain:  Not on file  . Food insecurity:    Worry: Not on file    Inability: Not on file  . Transportation needs:    Medical: Not on file    Non-medical: Not on file  Tobacco Use  . Smoking status: Former Smoker    Packs/day: 1.00    Years: 27.00    Pack years: 27.00    Types: Cigarettes    Last attempt to quit: 2011    Years since quitting: 8.9  . Smokeless tobacco: Never Used  Substance and Sexual Activity  . Alcohol use: Yes    Alcohol/week: 0.0 standard drinks  . Drug use: No  .  Sexual activity: Not Currently    Birth control/protection: Post-menopausal    Comment: menopause around age [age]  Lifestyle  . Physical activity:    Days per week: Not on file    Minutes per session: Not on file  . Stress: Not on file  Relationships  . Social connections:    Talks on phone: Not on file    Gets together: Not on file    Attends religious service: Not on file    Active member of club or organization: Not on file    Attends meetings of clubs or organizations: Not on file    Relationship status: Not on file  Other Topics Concern  . Not on file  Social History Narrative   Occasional alcohol use: hard liquor, wine and beer     Family History: The patient's family history includes Diabetes in her maternal grandmother; Heart disease in her mother; Hyperlipidemia in her father; Hypertension in her father and paternal grandmother; Irregular heart beat in her mother; Rheumatic fever in her mother; Stroke in her maternal grandmother; Ulcers in her father and paternal grandfather; Vision loss in her brother.  ROS:   Please see the history of present illness.    All other systems reviewed and are negative.  EKGs/Labs/Other Studies Reviewed:    The following studies were reviewed today: Coronary angiography report was discussed with the patient at extensive length.   Recent Labs: 04/12/2018: ALT 23; BUN 11; Creatinine, Ser 0.75; Hemoglobin 13.4; Platelets 293.0; Potassium 4.3; Sodium 139;  TSH 3.10  Recent Lipid Panel    Component Value Date/Time   CHOL 234 (H) 04/12/2018 1429   CHOL 219 (H) 11/17/2017 0936   TRIG (H) 04/12/2018 1429    427.0 Triglyceride is over 400; calculations on Lipids are invalid.   HDL 37.00 (L) 04/12/2018 1429   HDL 42 11/17/2017 0936   CHOLHDL 6 04/12/2018 1429   VLDL 48.0 (H) 12/21/2016 1654   LDLCALC 142 (H) 11/17/2017 0936   LDLDIRECT 158.0 04/12/2018 1429    Physical Exam:    VS:  BP 122/76 (BP Location: Right Arm, Patient Position: Sitting, Cuff Size: Normal)   Pulse 68   Ht 5\' 3"  (1.6 m)   Wt 220 lb (99.8 kg)   SpO2 98%   BMI 38.97 kg/m     Wt Readings from Last 3 Encounters:  05/13/18 220 lb (99.8 kg)  05/06/18 230 lb (104.3 kg)  04/25/18 218 lb (98.9 kg)     GEN: Patient is in no acute distress HEENT: Normal NECK: No JVD; No carotid bruits LYMPHATICS: No lymphadenopathy CARDIAC: Hear sounds regular, 2/6 systolic murmur at the apex. RESPIRATORY:  Clear to auscultation without rales, wheezing or rhonchi  ABDOMEN: Soft, non-tender, non-distended MUSCULOSKELETAL:  No edema; No deformity  SKIN: Warm and dry NEUROLOGIC:  Alert and oriented x 3 PSYCHIATRIC:  Normal affect   Signed, Jenean Lindau, MD  05/13/2018 10:29 AM    [place]

## 2018-06-03 ENCOUNTER — Other Ambulatory Visit: Payer: Self-pay | Admitting: Family Medicine

## 2018-06-03 DIAGNOSIS — R7303 Prediabetes: Secondary | ICD-10-CM

## 2018-06-03 DIAGNOSIS — E559 Vitamin D deficiency, unspecified: Secondary | ICD-10-CM

## 2018-06-03 NOTE — Telephone Encounter (Signed)
LOV on 04/12/2018 with Dr.Blyth / Sending this medication request to provider as patient has indicated she is taking more than what is noted in the system. / Note from 04/12/18 reviewed but I do not see where patient was instructed to take 100mg  (2-500mg ) tablets of metformin /

## 2018-06-03 NOTE — Telephone Encounter (Signed)
Copied from [place] [id number]. Topic: Quick Communication - Rx Refill/Question >> Jun 03, 2018  4:18 PM Judyann Munson wrote: Medication: Vitamin D, Ergocalciferol, (DRISDOL) 50000 units CAPS capsule, metFORMIN (GLUCOPHAGE) 500 MG tablet- patient state  is now taking 2 tablets of 500  Has the patient contacted their pharmacy?  Yes   Preferred Pharmacy (with phone number or street name): [facility] - [place], [place] - [address] (Phone) [phone] (Fax)    Agent: Please be advised that RX refills may take up to 3 business days. We ask that you follow-up with your pharmacy.

## 2018-06-06 ENCOUNTER — Telehealth: Payer: Self-pay

## 2018-06-06 MED ORDER — VITAMIN D (ERGOCALCIFEROL) 1.25 MG (50000 UNIT) PO CAPS: ORAL_CAPSULE | ORAL | 4 refills | Status: DC

## 2018-06-06 MED ORDER — METFORMIN HCL 500 MG PO TABS: 500.0000 mg | ORAL_TABLET | Freq: Two times a day (BID) | ORAL | 1 refills | Status: DC

## 2018-06-06 NOTE — Telephone Encounter (Signed)
Copied from [place] [id number]. Topic: General - Other >> Jun 03, 2018  4:21 PM Judyann Munson wrote: Reason for CRM:  patient is calling to request  lab orders for a Full lipid panel and  to  check her Thyroids. Please advise >> Jun 06, 2018  9:03 AM Conception Chancy, NT wrote: Patient is wanting to get her cholesterol checked as well.

## 2018-06-09 NOTE — Telephone Encounter (Signed)
No all of her labs were drawn in November they can be drawn again in February. Insurance will not pay before then

## 2018-06-09 NOTE — Telephone Encounter (Signed)
Mychart message sent to pt per below request.

## 2018-06-09 NOTE — Telephone Encounter (Signed)
Pt called, she would like to have her labs drawn. Please send a my chart message when orders are put in, so pt can schedule appt  Thanks

## 2018-06-09 NOTE — Telephone Encounter (Signed)
Patient had all labs drawn in November. Insurance will not pay to have them drawn again for 90 days so can draw in February

## 2018-07-11 ENCOUNTER — Other Ambulatory Visit: Payer: Self-pay | Admitting: Family Medicine

## 2018-07-11 MED ORDER — LEVOTHYROXINE SODIUM 25 MCG PO TABS: 25.0000 ug | ORAL_TABLET | Freq: Every day | ORAL | 0 refills | Status: DC

## 2018-07-11 NOTE — Telephone Encounter (Signed)
Please keep appointment for further refills.

## 2018-07-11 NOTE — Telephone Encounter (Signed)
Copied from [place] [id number]. Topic: Quick Communication - Rx Refill/Question >> Jul 11, 2018  1:50 PM Oneta Rack wrote: Medication: levothyroxine (SYNTHROID, LEVOTHROID) 25 MCG tablet   Has the patient contacted their pharmacy? Yes   (Agent: If yes, when and what did the pharmacy advise?) mail order advised patient to call PCP office   Preferred Pharmacy (with phone number or street name):  [facility] - [place], [place] - [address] (Phone) [phone] (Fax)   Agent: Please be advised that RX refills may take up to 3 business days. We ask that you follow-up with your pharmacy.

## 2018-08-11 ENCOUNTER — Encounter: Payer: Self-pay | Admitting: Family Medicine

## 2018-08-11 ENCOUNTER — Ambulatory Visit (INDEPENDENT_AMBULATORY_CARE_PROVIDER_SITE_OTHER): Payer: Medicare Other | Admitting: Family Medicine

## 2018-08-11 ENCOUNTER — Other Ambulatory Visit: Payer: Self-pay | Admitting: Family Medicine

## 2018-08-11 VITALS — BP 100/68 | HR 60 | Temp 98.0°F | Resp 18 | Wt 233.2 lb

## 2018-08-11 DIAGNOSIS — E038 Other specified hypothyroidism: Secondary | ICD-10-CM

## 2018-08-11 DIAGNOSIS — K5900 Constipation, unspecified: Secondary | ICD-10-CM

## 2018-08-11 DIAGNOSIS — E782 Mixed hyperlipidemia: Secondary | ICD-10-CM | POA: Diagnosis not present

## 2018-08-11 DIAGNOSIS — E538 Deficiency of other specified B group vitamins: Secondary | ICD-10-CM

## 2018-08-11 DIAGNOSIS — I10 Essential (primary) hypertension: Secondary | ICD-10-CM

## 2018-08-11 DIAGNOSIS — E8881 Metabolic syndrome: Secondary | ICD-10-CM

## 2018-08-11 DIAGNOSIS — R490 Dysphonia: Secondary | ICD-10-CM | POA: Diagnosis not present

## 2018-08-11 DIAGNOSIS — R0982 Postnasal drip: Secondary | ICD-10-CM

## 2018-08-11 DIAGNOSIS — E559 Vitamin D deficiency, unspecified: Secondary | ICD-10-CM | POA: Diagnosis not present

## 2018-08-11 DIAGNOSIS — M4802 Spinal stenosis, cervical region: Secondary | ICD-10-CM

## 2018-08-11 DIAGNOSIS — R0981 Nasal congestion: Secondary | ICD-10-CM | POA: Diagnosis not present

## 2018-08-11 DIAGNOSIS — R739 Hyperglycemia, unspecified: Secondary | ICD-10-CM | POA: Diagnosis not present

## 2018-08-11 DIAGNOSIS — R7303 Prediabetes: Secondary | ICD-10-CM | POA: Diagnosis not present

## 2018-08-11 DIAGNOSIS — Z124 Encounter for screening for malignant neoplasm of cervix: Secondary | ICD-10-CM

## 2018-08-11 MED ORDER — ALBUTEROL SULFATE HFA 108 (90 BASE) MCG/ACT IN AERS: 2.0000 | INHALATION_SPRAY | Freq: Four times a day (QID) | RESPIRATORY_TRACT | 0 refills | Status: DC | PRN

## 2018-08-11 NOTE — Assessment & Plan Note (Signed)
hgba1c acceptable, minimize simple carbs. Increase exercise as tolerated.  

## 2018-08-11 NOTE — Assessment & Plan Note (Addendum)
Encouraged heart healthy diet, increase exercise, avoid trans fats, consider a krill oil cap daily. Stopped Crestor

## 2018-08-11 NOTE — Assessment & Plan Note (Signed)
Supplement and monitor 

## 2018-08-11 NOTE — Assessment & Plan Note (Addendum)
On Levothyroxine, continue to monitor. Notes increased fatigue and labs show elevated TSH will increase levlothyroxine and recheck.

## 2018-08-11 NOTE — Assessment & Plan Note (Signed)
Well controlled, no changes to meds. Encouraged heart healthy diet such as the DASH diet and exercise as tolerated.  °

## 2018-08-11 NOTE — Patient Instructions (Addendum)
Keto light  MIND diet   CoverMyMinds or GoodRx   Shingrix is the new shingles 2 shots over 2-6 months at the pharmacy  60 to 80 of fluids  DASH Eating Plan DASH stands for "Dietary Approaches to Stop Hypertension." The DASH eating plan is a healthy eating plan that has been shown to reduce high blood pressure (hypertension). It may also reduce your risk for type 2 diabetes, heart disease, and stroke. The DASH eating plan may also help with weight loss. What are tips for following this plan?  General guidelines  Avoid eating more than 2,300 mg (milligrams) of salt (sodium) a day. If you have hypertension, you may need to reduce your sodium intake to 1,500 mg a day.  Limit alcohol intake to no more than 1 drink a day for nonpregnant women and 2 drinks a day for men. One drink equals 12 oz of beer, 5 oz of wine, or 1 oz of hard liquor.  Work with your health care provider to maintain a healthy body weight or to lose weight. Ask what an ideal weight is for you.  Get at least 30 minutes of exercise that causes your heart to beat faster (aerobic exercise) most days of the week. Activities may include walking, swimming, or biking.  Work with your health care provider or diet and nutrition specialist (dietitian) to adjust your eating plan to your individual calorie needs. Reading food labels   Check food labels for the amount of sodium per serving. Choose foods with less than 5 percent of the Daily Value of sodium. Generally, foods with less than 300 mg of sodium per serving fit into this eating plan.  To find whole grains, look for the word "whole" as the first word in the ingredient list. Shopping  Buy products labeled as "low-sodium" or "no salt added."  Buy fresh foods. Avoid canned foods and premade or frozen meals. Cooking  Avoid adding salt when cooking. Use salt-free seasonings or herbs instead of table salt or sea salt. Check with your health care provider or pharmacist  before using salt substitutes.  Do not fry foods. Cook foods using healthy methods such as baking, boiling, grilling, and broiling instead.  Cook with heart-healthy oils, such as olive, canola, soybean, or sunflower oil. Meal planning  Eat a balanced diet that includes: ? 5 or more servings of fruits and vegetables each day. At each meal, try to fill half of your plate with fruits and vegetables. ? Up to 6-8 servings of whole grains each day. ? Less than 6 oz of lean meat, poultry, or fish each day. A 3-oz serving of meat is about the same size as a deck of cards. One egg equals 1 oz. ? 2 servings of low-fat dairy each day. ? A serving of nuts, seeds, or beans 5 times each week. ? Heart-healthy fats. Healthy fats called Omega-3 fatty acids are found in foods such as flaxseeds and coldwater fish, like sardines, salmon, and mackerel.  Limit how much you eat of the following: ? Canned or prepackaged foods. ? Food that is high in trans fat, such as fried foods. ? Food that is high in saturated fat, such as fatty meat. ? Sweets, desserts, sugary drinks, and other foods with added sugar. ? Full-fat dairy products.  Do not salt foods before eating.  Try to eat at least 2 vegetarian meals each week.  Eat more home-cooked food and less restaurant, buffet, and fast food.  When eating at a restaurant,  ask that your food be prepared with less salt or no salt, if possible. What foods are recommended? The items listed may not be a complete list. Talk with your dietitian about what dietary choices are [place] for you. Grains Whole-grain or whole-wheat bread. Whole-grain or whole-wheat pasta. Brown rice. Modena Morrow. Bulgur. Whole-grain and low-sodium cereals. Pita bread. Low-fat, low-sodium crackers. Whole-wheat flour tortillas. Vegetables Fresh or frozen vegetables (raw, steamed, roasted, or grilled). Low-sodium or reduced-sodium tomato and vegetable juice. Low-sodium or reduced-sodium tomato  sauce and tomato paste. Low-sodium or reduced-sodium canned vegetables. Fruits All fresh, dried, or frozen fruit. Canned fruit in natural juice (without added sugar). Meat and other protein foods Skinless chicken or [place]. Ground chicken or [place]. Pork with fat trimmed off. Fish and seafood. Egg whites. Dried beans, peas, or lentils. Unsalted nuts, nut butters, and seeds. Unsalted canned beans. Lean cuts of beef with fat trimmed off. Low-sodium, lean deli meat. Dairy Low-fat (1%) or fat-free (skim) milk. Fat-free, low-fat, or reduced-fat cheeses. Nonfat, low-sodium ricotta or cottage cheese. Low-fat or nonfat yogurt. Low-fat, low-sodium cheese. Fats and oils Soft margarine without trans fats. Vegetable oil. Low-fat, reduced-fat, or light mayonnaise and salad dressings (reduced-sodium). Canola, safflower, olive, soybean, and sunflower oils. Avocado. Seasoning and other foods Herbs. Spices. Seasoning mixes without salt. Unsalted popcorn and pretzels. Fat-free sweets. What foods are not recommended? The items listed may not be a complete list. Talk with your dietitian about what dietary choices are [place] for you. Grains Baked goods made with fat, such as croissants, muffins, or some breads. Dry pasta or rice meal packs. Vegetables Creamed or fried vegetables. Vegetables in a cheese sauce. Regular canned vegetables (not low-sodium or reduced-sodium). Regular canned tomato sauce and paste (not low-sodium or reduced-sodium). Regular tomato and vegetable juice (not low-sodium or reduced-sodium). Angie Fava. Olives. Fruits Canned fruit in a light or heavy syrup. Fried fruit. Fruit in cream or butter sauce. Meat and other protein foods Fatty cuts of meat. Ribs. Fried meat. Berniece Salines. Sausage. Bologna and other processed lunch meats. Salami. Fatback. Hotdogs. Bratwurst. Salted nuts and seeds. Canned beans with added salt. Canned or smoked fish. Whole eggs or egg yolks. Chicken or [place] with skin. Dairy Whole  or 2% milk, cream, and half-and-half. Whole or full-fat cream cheese. Whole-fat or sweetened yogurt. Full-fat cheese. Nondairy creamers. Whipped toppings. Processed cheese and cheese spreads. Fats and oils Butter. Stick margarine. Lard. Shortening. Ghee. Bacon fat. Tropical oils, such as coconut, palm kernel, or palm oil. Seasoning and other foods Salted popcorn and pretzels. Onion salt, garlic salt, seasoned salt, table salt, and sea salt. Worcestershire sauce. Tartar sauce. Barbecue sauce. Teriyaki sauce. Soy sauce, including reduced-sodium. Steak sauce. Canned and packaged gravies. Fish sauce. Oyster sauce. Cocktail sauce. Horseradish that you find on the shelf. Ketchup. Mustard. Meat flavorings and tenderizers. Bouillon cubes. Hot sauce and Tabasco sauce. Premade or packaged marinades. Premade or packaged taco seasonings. Relishes. Regular salad dressings. Where to find more information:  National Heart, Lung, and [place]: https://wilson-eaton.com/  American Heart Association: www.heart.org Summary  The DASH eating plan is a healthy eating plan that has been shown to reduce high blood pressure (hypertension). It may also reduce your risk for type 2 diabetes, heart disease, and stroke.  With the DASH eating plan, you should limit salt (sodium) intake to 2,300 mg a day. If you have hypertension, you may need to reduce your sodium intake to 1,500 mg a day.  When on the DASH eating plan, aim to eat more fresh  fruits and vegetables, whole grains, lean proteins, low-fat dairy, and heart-healthy fats.  Work with your health care provider or diet and nutrition specialist (dietitian) to adjust your eating plan to your individual calorie needs. This information is not intended to replace advice given to you by your health care provider. Make sure you discuss any questions you have with your health care provider. Document Released: 05/14/2011 Document Revised: 05/18/2016 Document Reviewed:  05/18/2016 Elsevier Interactive Patient Education  2019 [employer].

## 2018-08-12 ENCOUNTER — Other Ambulatory Visit: Payer: Self-pay | Admitting: Family Medicine

## 2018-08-12 DIAGNOSIS — E782 Mixed hyperlipidemia: Secondary | ICD-10-CM

## 2018-08-12 DIAGNOSIS — Z789 Other specified health status: Secondary | ICD-10-CM

## 2018-08-12 LAB — COMPREHENSIVE METABOLIC PANEL
ALT: 18 U/L (ref 0–35)
AST: 19 U/L (ref 0–37)
Albumin: 4.8 g/dL (ref 3.5–5.2)
Alkaline Phosphatase: 47 U/L (ref 39–117)
BUN: 15 mg/dL (ref 6–23)
CO2: 26 mEq/L (ref 19–32)
Calcium: 9.8 mg/dL (ref 8.4–10.5)
Chloride: 98 mEq/L (ref 96–112)
Creatinine, Ser: 1.11 mg/dL (ref 0.40–1.20)
GFR: 49.2 mL/min — ABNORMAL LOW (ref >60.00)
Glucose, Bld: 92 mg/dL (ref 70–99)
Potassium: 4.6 mEq/L (ref 3.5–5.1)
Sodium: 136 mEq/L (ref 135–145)
Total Bilirubin: 0.6 mg/dL (ref 0.2–1.2)
Total Protein: 7.3 g/dL (ref 6.0–8.3)

## 2018-08-12 LAB — CBC
HCT: 41.3 % (ref 36.0–46.0)
Hemoglobin: 14 g/dL (ref 12.0–15.0)
MCHC: 33.8 g/dL (ref 30.0–36.0)
MCV: 91.9 fl (ref 78.0–100.0)
Platelets: 282 10*3/uL (ref 150.0–400.0)
RBC: 4.5 Mil/uL (ref 3.87–5.11)
RDW: 15 % (ref 11.5–15.5)
WBC: 5.8 10*3/uL (ref 4.0–10.5)

## 2018-08-12 LAB — LIPID PANEL
Cholesterol: 283 mg/dL — ABNORMAL HIGH (ref 0–200)
HDL: 51.5 mg/dL (ref >39.00)
NonHDL: 231.71
Total CHOL/HDL Ratio: 5
Triglycerides: 208 mg/dL — ABNORMAL HIGH (ref 0.0–149.0)
VLDL: 41.6 mg/dL — AB (ref 0.0–40.0)

## 2018-08-12 LAB — LDL CHOLESTEROL, DIRECT: Direct LDL: 205 mg/dL

## 2018-08-12 LAB — VITAMIN B12: Vitamin B-12: 1097 pg/mL — ABNORMAL HIGH (ref 211–911)

## 2018-08-12 LAB — HEMOGLOBIN A1C: Hgb A1c MFr Bld: 5.9 % (ref 4.6–6.5)

## 2018-08-12 LAB — TSH: TSH: 160.18 u[IU]/mL — ABNORMAL HIGH (ref 0.35–4.50)

## 2018-08-12 LAB — VITAMIN D 25 HYDROXY (VIT D DEFICIENCY, FRACTURES): VITD: 52.52 ng/mL (ref 30.00–100.00)

## 2018-08-12 MED ORDER — LEVOTHYROXINE SODIUM 75 MCG PO TABS: 75.0000 ug | ORAL_TABLET | Freq: Every day | ORAL | 3 refills | Status: DC

## 2018-08-14 NOTE — Assessment & Plan Note (Signed)
With persistent sinus pain and congestion. She is ready for a referral to ENT for further evaluation.

## 2018-08-14 NOTE — Progress Notes (Signed)
Subjective:    Patient ID: Destiny [name], female    DOB: 01-Mar-1951, 68 y.o.   MRN: [id number]  No chief complaint on file.   HPI Patient is in today for followup and is complaining of pelvic and lower extremity pain, dry skin, constipation, fatigue, poor appetite and muscle spasms. No recent febrile illness or hospitalizations. Denies CP/palp/SOB/HA/congestion/fevers/GI or GU c/o. Taking meds as prescribed  Past Medical History:  Diagnosis Date  . Abnormal blood chemistry   . Arthropathy   . Back pain   . Breast cyst   . Cellulitis   . Chest discomfort   . Constipation   . Disturbance of skin sensation   . Edema   . Encounter for screening and preventative care 06/17/2016  . GERD (gastroesophageal reflux disease)   . Hiatal hernia with gastroesophageal reflux 06/17/2016  . High risk medication use   . Hoarseness 06/15/2016  . Hyperglycemia   . Hyperglycemia 12/21/2016  . Hyperlipidemia   . Hyperlipidemia, mixed 06/15/2016  . Hypertension   . Hypopotassemia   . Hypothyroidism   . Joint pain   . Menopause   . Obesity   . Palpitations   . Positive ANA (antinuclear antibody)   . Shoulder pain, right   . Sleep apnea   . SOB (shortness of breath)   . Thyroid disease 12/11/2014  . Thyroid nodule   . Urinary incontinence 04/01/2017  . Vitamin D deficiency   . Vitamin D deficiency     Past Surgical History:  Procedure Laterality Date  . BREAST CYST ASPIRATION Left    30 years ago  1970's or 1980's  . INTRAVASCULAR PRESSURE WIRE/FFR STUDY N/A 04/25/2018   Procedure: INTRAVASCULAR PRESSURE WIRE/FFR STUDY;  Surgeon: Jettie Booze, MD;  Location: [facility];  Service: Cardiovascular;  Laterality: N/A;  . LEFT HEART CATH AND CORONARY ANGIOGRAPHY N/A 04/25/2018   Procedure: LEFT HEART CATH AND CORONARY ANGIOGRAPHY;  Surgeon: Jettie Booze, MD;  Location: [facility];  Service: Cardiovascular;  Laterality: N/A;  . LIPOSUCTION  1990   over gluteal area    . WISDOM TOOTH EXTRACTION     and all upper teeth    Family History  Problem Relation Age of Onset  . Irregular heart beat Mother   . Heart disease Mother   . Rheumatic fever Mother   . Hypertension Father   . Ulcers Father   . Hyperlipidemia Father   . Vision loss Brother   . Stroke Maternal Grandmother   . Diabetes Maternal Grandmother   . Hypertension Paternal Grandmother   . Ulcers Paternal Grandfather     Social History   Socioeconomic History  . Marital status: Married    Spouse name: Huntleigh Doolen  . Number of children: Not on file  . Years of education: Not on file  . Highest education level: Not on file  Occupational History  . Occupation: homemaker  Social Needs  . Financial resource strain: Not on file  . Food insecurity:    Worry: Not on file    Inability: Not on file  . Transportation needs:    Medical: Not on file    Non-medical: Not on file  Tobacco Use  . Smoking status: Former Smoker    Packs/day: 1.00    Years: 27.00    Pack years: 27.00    Types: Cigarettes    Last attempt to quit: 2011    Years since quitting: 9.1  . Smokeless tobacco: Never Used  Substance and Sexual Activity  . Alcohol use: Yes    Alcohol/week: 0.0 standard drinks  . Drug use: No  . Sexual activity: Not Currently    Birth control/protection: Post-menopausal    Comment: menopause around age [age]  Lifestyle  . Physical activity:    Days per week: Not on file    Minutes per session: Not on file  . Stress: Not on file  Relationships  . Social connections:    Talks on phone: Not on file    Gets together: Not on file    Attends religious service: Not on file    Active member of club or organization: Not on file    Attends meetings of clubs or organizations: Not on file    Relationship status: Not on file  . Intimate partner violence:    Fear of current or ex partner: Not on file    Emotionally abused: Not on file    Physically abused: Not on file    Forced sexual  activity: Not on file  Other Topics Concern  . Not on file  Social History Narrative   Occasional alcohol use: hard liquor, wine and beer    Outpatient Medications Prior to Visit  Medication Sig Dispense Refill  . COD LIVER OIL PO Take 1 capsule by mouth daily. Reported on 07/16/2015    . Cyanocobalamin (VITAMIN B-12) 500 MCG LOZG Take 500 mcg by mouth daily.     . irbesartan (AVAPRO) 150 MG tablet Take 1 tablet (150 mg total) by mouth daily. 90 tablet 1  . metFORMIN (GLUCOPHAGE) 500 MG tablet Take 1 tablet (500 mg total) by mouth 2 (two) times daily. 180 tablet 1  . metoprolol succinate (TOPROL-XL) 25 MG 24 hr tablet Take 1 tablet (25 mg total) by mouth daily. 90 tablet 1  . nitroGLYCERIN (NITROSTAT) 0.4 MG SL tablet Place 1 tablet (0.4 mg total) under the tongue every 5 (five) minutes as needed for chest pain. 25 tablet 3  . Probiotic Product (PROBIOTIC-10) CAPS Take 1 capsule by mouth daily.     . RED YEAST RICE EXTRACT PO Take 1 capsule by mouth daily. Reported on 07/16/2015    . Vitamin D, Ergocalciferol, (DRISDOL) 1.25 MG (50000 UT) CAPS capsule TAKE 1 CAPSULE BY MOUTH EVERY 7 DAYS 12 capsule 4  . Wheat Dextrin (BENEFIBER DRINK MIX PO) Take 1 capsule by mouth daily.     Marland Kitchen levothyroxine (SYNTHROID, LEVOTHROID) 25 MCG tablet Take 1 tablet (25 mcg total) by mouth daily before breakfast. 90 tablet 0  . rosuvastatin (CRESTOR) 10 MG tablet Take 1 tablet (10 mg total) by mouth daily. 90 tablet 3   No facility-administered medications prior to visit.     Allergies  Allergen Reactions  . Crestor [Rosuvastatin Calcium] Other (See Comments)    myalgias  . Diclofenac Itching, Nausea Only and Other (See Comments)    Wheezing  . Hydrochlorothiazide     Wheezing. Losing teeth.  . Statins     Review of Systems  Constitutional: Positive for malaise/fatigue. Negative for fever.  HENT: Positive for congestion and sinus pain.   Eyes: Negative for blurred vision.  Respiratory: Positive for  sputum production. Negative for shortness of breath.   Cardiovascular: Negative for chest pain, palpitations and leg swelling.  Gastrointestinal: Positive for constipation. Negative for abdominal pain, blood in stool and nausea.  Genitourinary: Negative for dysuria and frequency.  Musculoskeletal: Positive for joint pain and myalgias. Negative for falls.  Skin: Negative for rash.  Neurological: Negative for dizziness, loss of consciousness and headaches.  Endo/Heme/Allergies: Negative for environmental allergies.  Psychiatric/Behavioral: Negative for depression. The patient is not nervous/anxious.        Objective:    Physical Exam Vitals signs and nursing note reviewed.  Constitutional:      General: She is not in acute distress.    Appearance: She is well-developed.  HENT:     Head: Normocephalic and atraumatic.     Nose: Nose normal.  Eyes:     General:        Right eye: No discharge.        Left eye: No discharge.  Neck:     Musculoskeletal: Normal range of motion and neck supple.  Cardiovascular:     Rate and Rhythm: Normal rate and regular rhythm.     Heart sounds: No murmur.  Pulmonary:     Effort: Pulmonary effort is normal.     Breath sounds: Normal breath sounds.  Abdominal:     General: Bowel sounds are normal.     Palpations: Abdomen is soft.     Tenderness: There is no abdominal tenderness.  Skin:    General: Skin is warm and dry.  Neurological:     Mental Status: She is alert and oriented to person, place, and time.     BP 100/68 (BP Location: Left Arm, Patient Position: Sitting, Cuff Size: Normal)   Pulse 60   Temp 98 F (36.7 C) (Oral)   Resp 18   Wt 233 lb 3.2 oz (105.8 kg)   SpO2 98%   BMI 41.31 kg/m  Wt Readings from Last 3 Encounters:  08/11/18 233 lb 3.2 oz (105.8 kg)  05/13/18 220 lb (99.8 kg)  05/06/18 230 lb (104.3 kg)     Lab Results  Component Value Date   WBC 5.8 08/11/2018   HGB 14.0 08/11/2018   HCT 41.3 08/11/2018   PLT  282.0 08/11/2018   GLUCOSE 92 08/11/2018   CHOL 283 (H) 08/11/2018   TRIG 208.0 (H) 08/11/2018   HDL 51.50 08/11/2018   LDLDIRECT 205.0 08/11/2018   LDLCALC 142 (H) 11/17/2017   ALT 18 08/11/2018   AST 19 08/11/2018   NA 136 08/11/2018   K 4.6 08/11/2018   CL 98 08/11/2018   CREATININE 1.11 08/11/2018   BUN 15 08/11/2018   CO2 26 08/11/2018   TSH 160.18 (H) 08/11/2018   HGBA1C 5.9 08/11/2018    Lab Results  Component Value Date   TSH 160.18 (H) 08/11/2018   Lab Results  Component Value Date   WBC 5.8 08/11/2018   HGB 14.0 08/11/2018   HCT 41.3 08/11/2018   MCV 91.9 08/11/2018   PLT 282.0 08/11/2018   Lab Results  Component Value Date   NA 136 08/11/2018   K 4.6 08/11/2018   CO2 26 08/11/2018   GLUCOSE 92 08/11/2018   BUN 15 08/11/2018   CREATININE 1.11 08/11/2018   BILITOT 0.6 08/11/2018   ALKPHOS 47 08/11/2018   AST 19 08/11/2018   ALT 18 08/11/2018   PROT 7.3 08/11/2018   ALBUMIN 4.8 08/11/2018   CALCIUM 9.8 08/11/2018   GFR 49.20 (L) 08/11/2018   Lab Results  Component Value Date   CHOL 283 (H) 08/11/2018   Lab Results  Component Value Date   HDL 51.50 08/11/2018   Lab Results  Component Value Date   LDLCALC 142 (H) 11/17/2017   Lab Results  Component Value Date   TRIG 208.0 (H) 08/11/2018  Lab Results  Component Value Date   CHOLHDL 5 08/11/2018   Lab Results  Component Value Date   HGBA1C 5.9 08/11/2018       Assessment & Plan:   Problem List Items Addressed This Visit    Hypothyroidism (Chronic)    On Levothyroxine, continue to monitor. Notes increased fatigue and labs show elevated TSH will increase levlothyroxine and recheck.       Relevant Medications   levothyroxine (SYNTHROID, LEVOTHROID) 75 MCG tablet   Essential hypertension    Well controlled, no changes to meds. Encouraged heart healthy diet such as the DASH diet and exercise as tolerated.       Hoarseness - Primary    With persistent sinus pain and congestion.  She is ready for a referral to ENT for further evaluation.       Relevant Orders   Ambulatory referral to ENT   Mixed hyperlipidemia    Encouraged heart healthy diet, increase exercise, avoid trans fats, consider a krill oil cap daily. Stopped Crestor      Vitamin D deficiency    Supplement and monitor      Prediabetes   Insulin resistance    hgba1c acceptable, minimize simple carbs. Increase exercise as tolerated.       Constipation    Encouraged increased hydration and fiber in diet. Daily probiotics. If bowels not moving can use MOM 2 tbls po in 4 oz of warm prune juice by mouth every 2-3 days. If no results then repeat in 4 hours with  Dulcolax suppository pr, may repeat again in 4 more hours as needed. Seek care if symptoms worsen. Consider daily Miralax and/or Dulcolax if symptoms persist.       Vitamin B12 deficiency    Supplement and monitor      Foraminal stenosis of cervical region    Other Visit Diagnoses    Nasal congestion       Relevant Orders   Ambulatory referral to ENT   Post-nasal drip       Relevant Orders   Ambulatory referral to ENT   Hyperglycemia          I have discontinued Jaleeya A. Sarchet's rosuvastatin and levothyroxine. I am also having her start on albuterol and levothyroxine. Additionally, I am having her maintain her COD LIVER OIL PO, RED YEAST RICE EXTRACT PO, Vitamin B-12, Probiotic-10, Wheat Dextrin (BENEFIBER DRINK MIX PO), nitroGLYCERIN, irbesartan, metoprolol succinate, Vitamin D (Ergocalciferol), and metFORMIN.  Meds ordered this encounter  Medications  . albuterol (PROVENTIL HFA;VENTOLIN HFA) 108 (90 Base) MCG/ACT inhaler    Sig: Inhale 2 puffs into the lungs every 6 (six) hours as needed for wheezing or shortness of breath.    Dispense:  1 Inhaler    Refill:  0  . levothyroxine (SYNTHROID, LEVOTHROID) 75 MCG tablet    Sig: Take 1 tablet (75 mcg total) by mouth daily.    Dispense:  90 tablet    Refill:  3     Penni Homans,  MD

## 2018-08-14 NOTE — Assessment & Plan Note (Signed)
Encouraged increased hydration and fiber in diet. Daily probiotics. If bowels not moving can use MOM 2 tbls po in 4 oz of warm prune juice by mouth every 2-3 days. If no results then repeat in 4 hours with  Dulcolax suppository pr, may repeat again in 4 more hours as needed. Seek care if symptoms worsen. Consider daily Miralax and/or Dulcolax if symptoms persist.  

## 2018-08-31 DIAGNOSIS — D485 Neoplasm of uncertain behavior of skin: Secondary | ICD-10-CM | POA: Diagnosis not present

## 2018-08-31 DIAGNOSIS — L570 Actinic keratosis: Secondary | ICD-10-CM | POA: Diagnosis not present

## 2018-08-31 DIAGNOSIS — L821 Other seborrheic keratosis: Secondary | ICD-10-CM | POA: Diagnosis not present

## 2018-08-31 DIAGNOSIS — L281 Prurigo nodularis: Secondary | ICD-10-CM | POA: Diagnosis not present

## 2018-09-08 ENCOUNTER — Encounter: Payer: Medicare Other | Admitting: Obstetrics & Gynecology

## 2018-10-11 ENCOUNTER — Ambulatory Visit: Payer: Medicare Other | Admitting: Internal Medicine

## 2018-10-31 ENCOUNTER — Other Ambulatory Visit: Payer: Self-pay | Admitting: Family Medicine

## 2018-10-31 ENCOUNTER — Encounter: Payer: Self-pay | Admitting: Family Medicine

## 2018-11-01 MED ORDER — ALBUTEROL SULFATE HFA 108 (90 BASE) MCG/ACT IN AERS: 2.0000 | INHALATION_SPRAY | Freq: Four times a day (QID) | RESPIRATORY_TRACT | 5 refills | Status: AC | PRN

## 2018-11-02 ENCOUNTER — Other Ambulatory Visit: Payer: Self-pay | Admitting: Family Medicine

## 2018-11-02 MED ORDER — FUROSEMIDE 20 MG PO TABS: ORAL_TABLET | ORAL | 0 refills | Status: DC

## 2018-11-04 ENCOUNTER — Other Ambulatory Visit: Payer: Self-pay | Admitting: Family Medicine

## 2018-11-04 MED ORDER — FUROSEMIDE 20 MG PO TABS: ORAL_TABLET | ORAL | 0 refills | Status: DC

## 2018-11-04 NOTE — Telephone Encounter (Signed)
Rx resent.

## 2018-11-04 NOTE — Telephone Encounter (Signed)
Copied from [place] [phone]. Topic: Quick Communication - Rx Refill/Question >> Nov 04, 2018 10:14 AM Keene Breath wrote: Medication: furosemide (LASIX) 20 MG tablet  Patient called to request a refill for the above medication.  Patient has changed pharmacy and will not pick it up from [facility].  Preferred Pharmacy (with phone number or street name): [facility] - [place], [place] - [address] (Phone) [phone] (Fax)

## 2018-11-29 IMAGING — US US TRANSVAGINAL NON-OB
1 series · 13 of 25 positions shown · non-contrast
Comparison: None

CLINICAL DATA: Bilateral lower quadrant pain. History of ovarian
cysts. Postmenopausal female.

EXAM:
TRANSABDOMINAL AND TRANSVAGINAL ULTRASOUND OF PELVIS
TECHNIQUE: Both transabdominal and transvaginal ultrasound examinations of the
pelvis were performed. Transabdominal technique was performed for
global imaging of the pelvis including uterus, ovaries, adnexal
regions, and pelvic cul-de-sac. It was necessary to proceed with
endovaginal exam following the transabdominal exam to visualize the
endometrium and ovaries.

[Series 1: us transvaginal non-ob · 0.22mm/px · 13 of 89 slices shown]
[im 1/89]
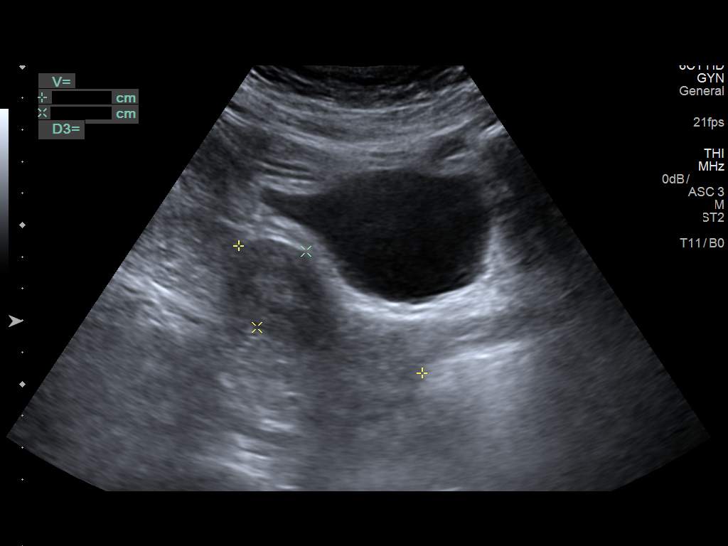
[im 8/89]
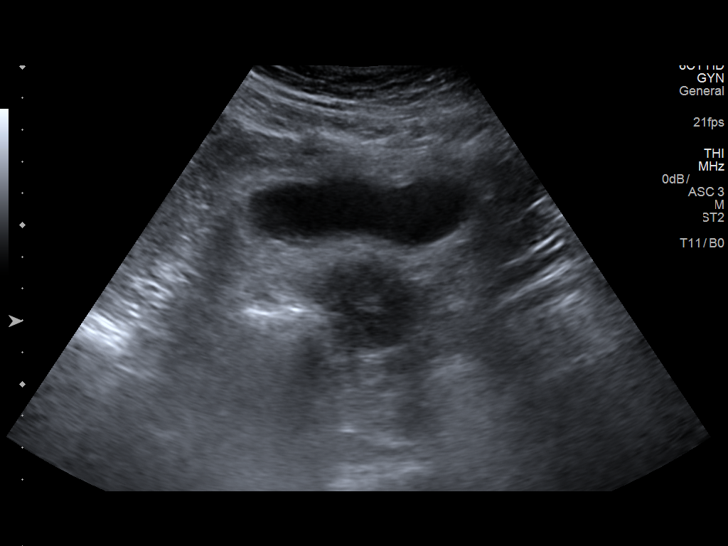
[im 15/89]
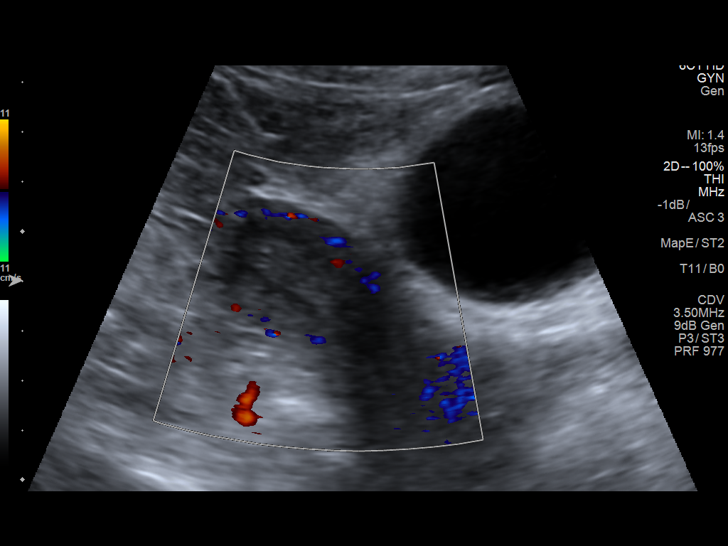
[im 23/89]
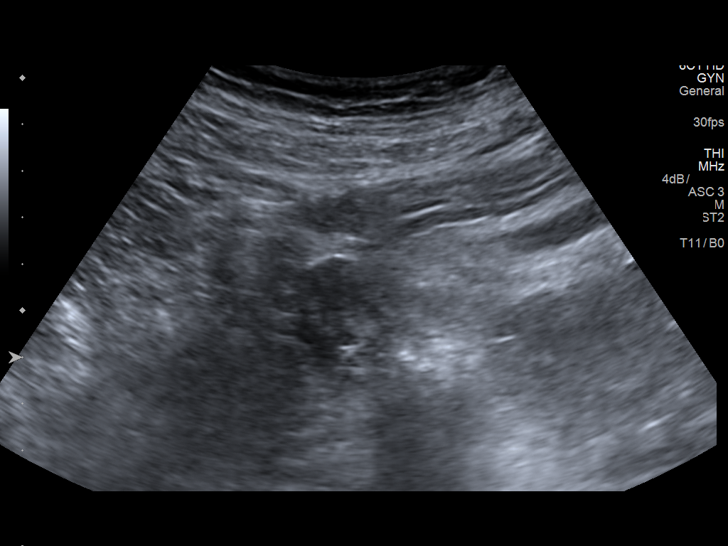
[im 30/89]
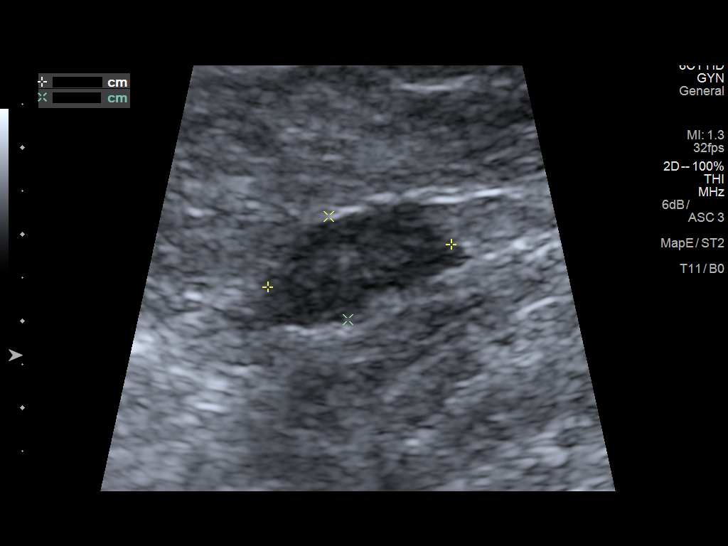
[im 37/89]
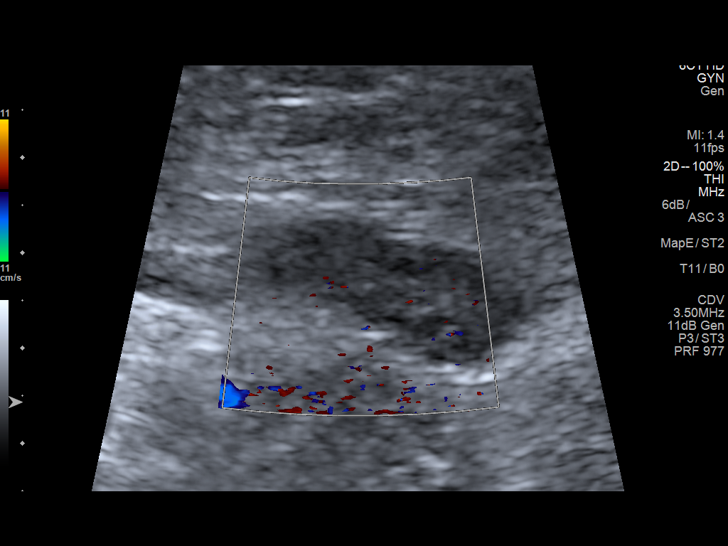
[im 45/89]
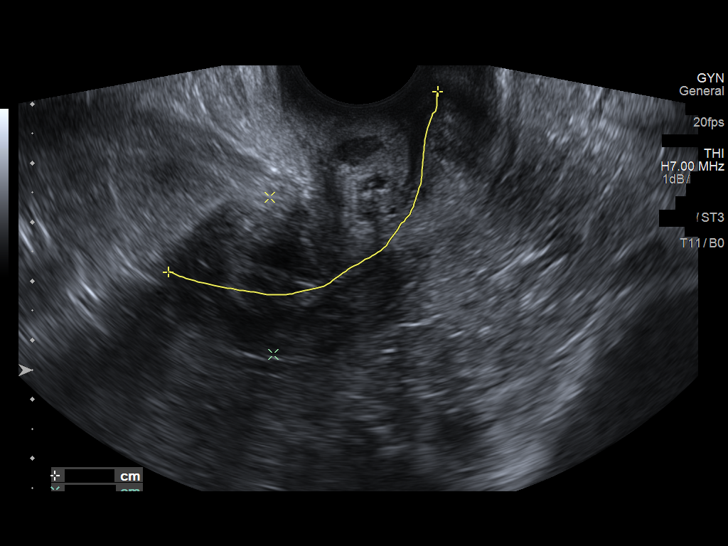
[im 52/89]
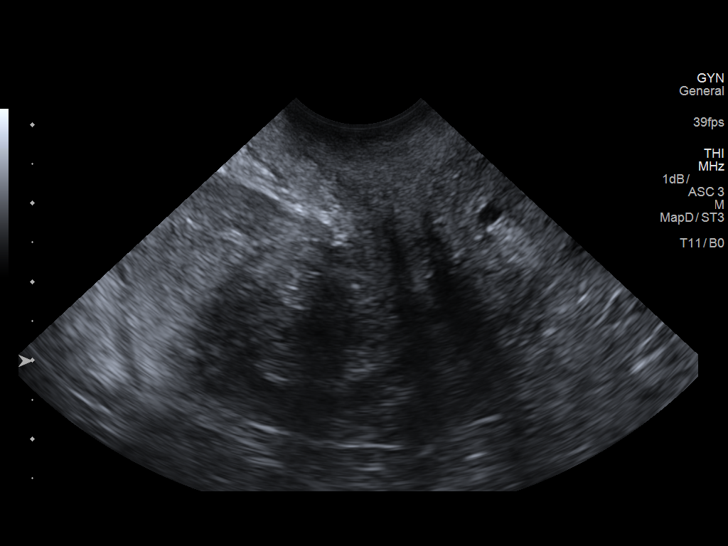
[im 59/89]
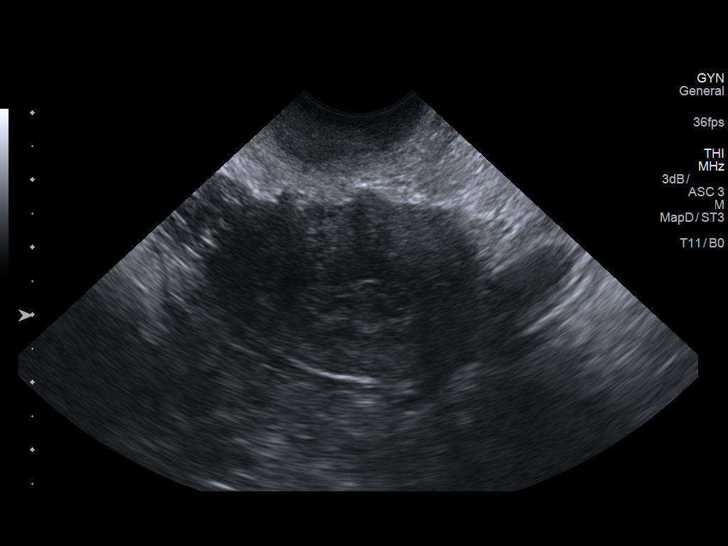
[im 67/89]
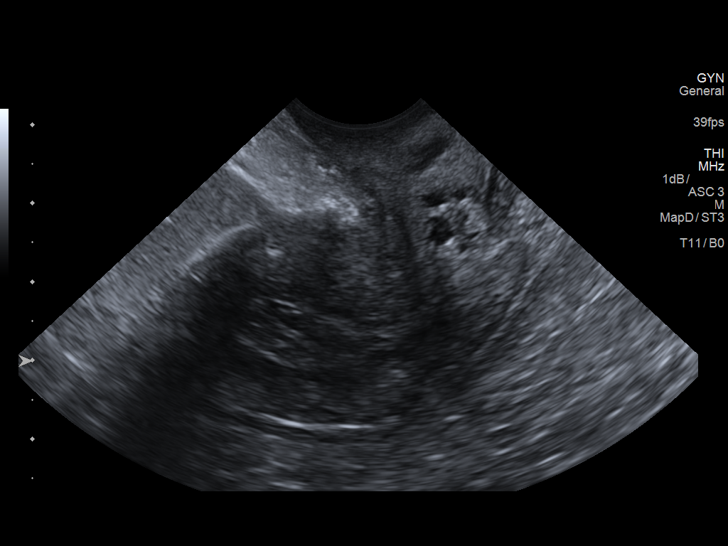
[im 74/89]
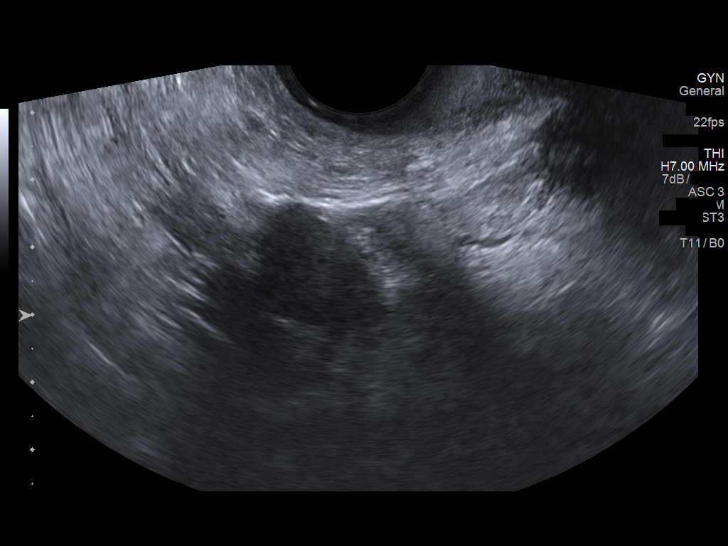
[im 81/89]
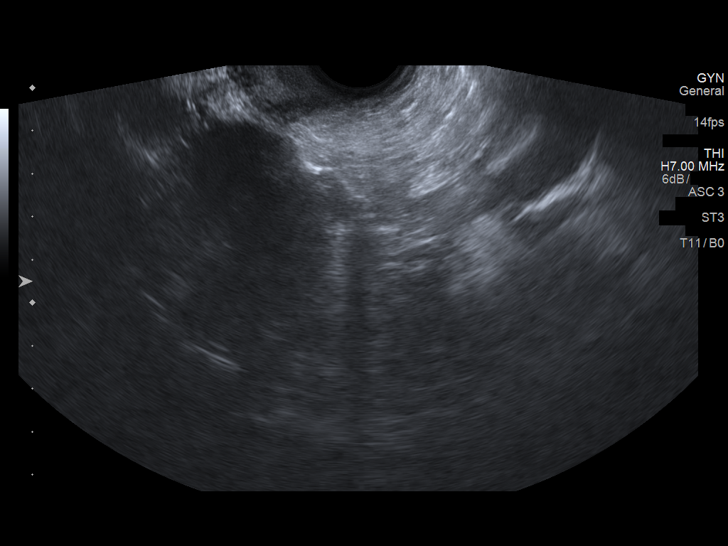
[im 89/89]
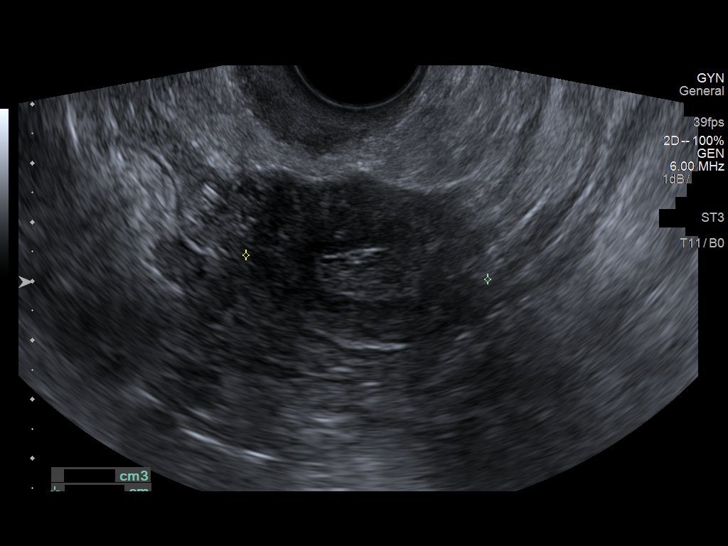

[13 of 25 positions shown; findings below may reference images not displayed]

FINDINGS: Uterus

Measurements: 6.8 x 3.1 x 4.1 cm = volume: 44.9 mL. No fibroids or
other mass visualized.

Endometrium

Thickness: 8 mm. Heterogeneous echogenicity noted, although no focal
abnormality visualized.

Right ovary

Measurements: 2.2 x 1.6 x 2.4 cm = volume: 4.5 mL. Normal
appearance/no adnexal mass.

Left ovary

Measurements: 2.3 x 1.3 x 2.5 cm = volume: 4.0 mL. Normal
appearance/no adnexal mass.

Other findings

No abnormal free fluid.
IMPRESSION: Endometrial thickness measures 8 mm. Endometrial thickness is
considered abnormal for an asymptomatic post-menopausal female.
Endometrial sampling should be considered to exclude carcinoma.

Normal appearance of both ovaries.  No adnexal mass identified.

## 2018-11-29 IMAGING — US US ABDOMEN COMPLETE
1 series · 13 of 25 positions shown · non-contrast
Comparison: None.

CLINICAL DATA: Lower abdomen pain, constipation, history of
liposuction

EXAM:
ABDOMEN ULTRASOUND COMPLETE

[Series 1: us abdomen complete · 0.18mm/px · 13 of 127 slices shown]
[im 1/127]
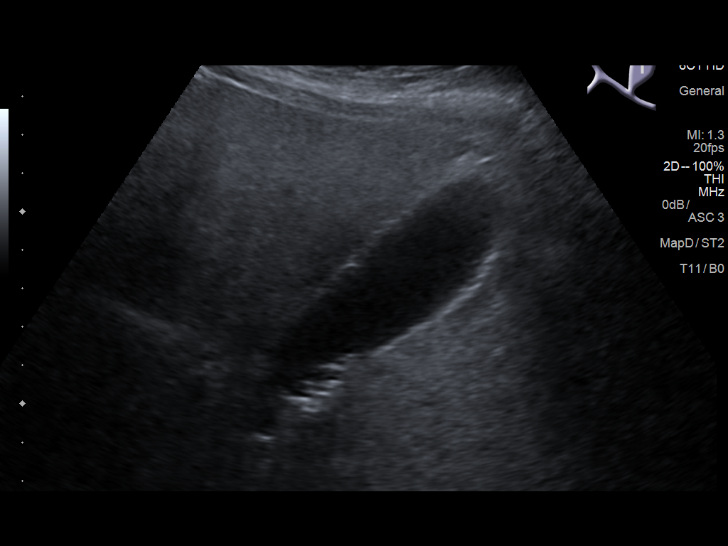
[im 11/127]
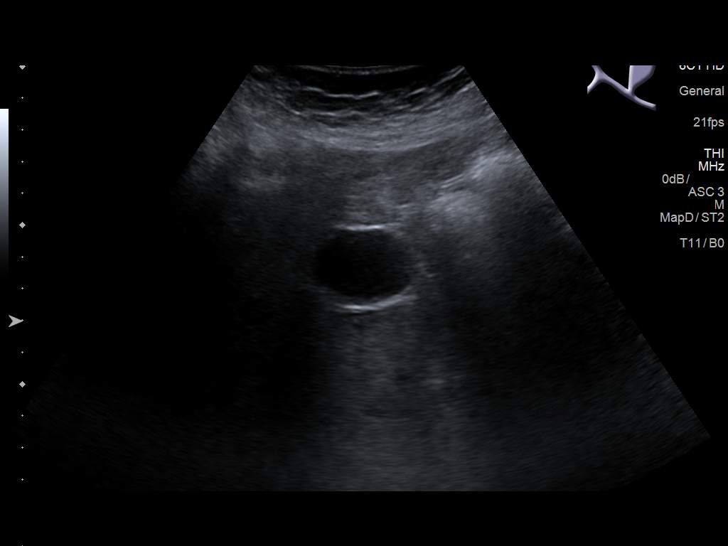
[im 22/127]
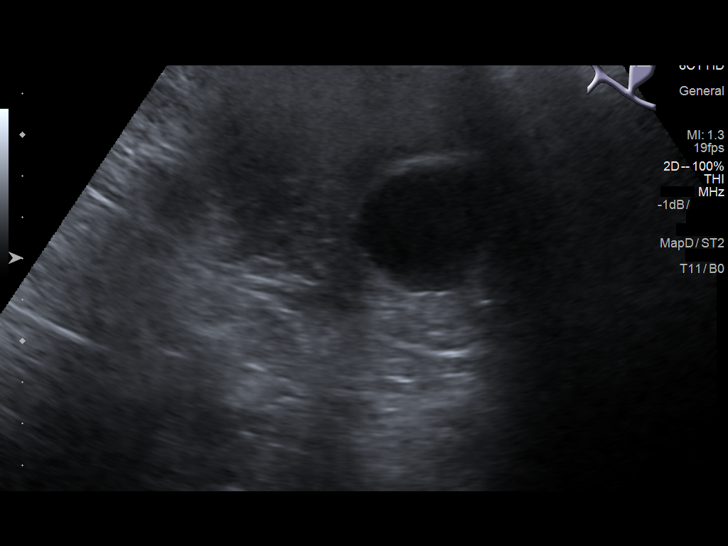
[im 32/127]
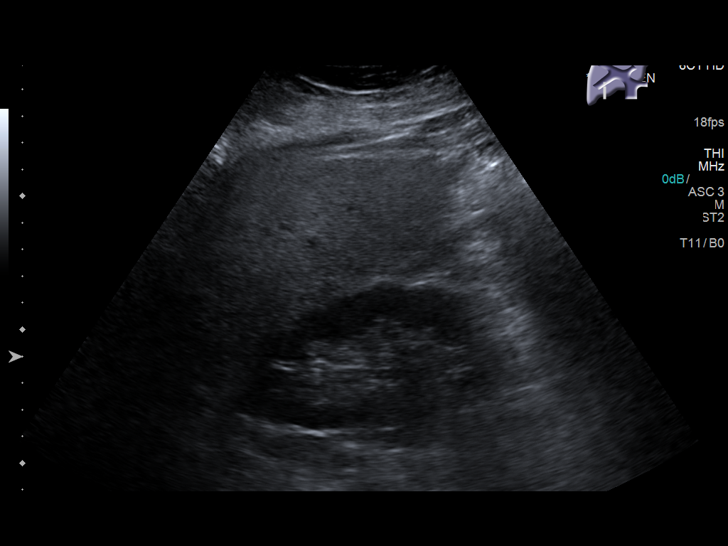
[im 43/127]
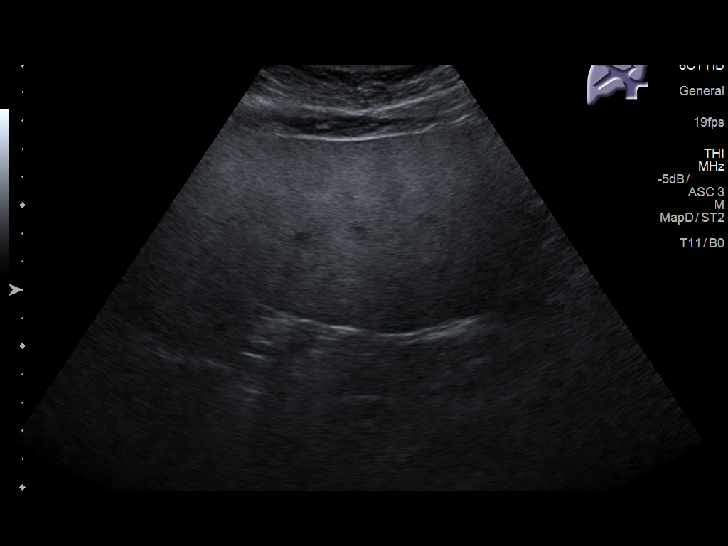
[im 53/127]
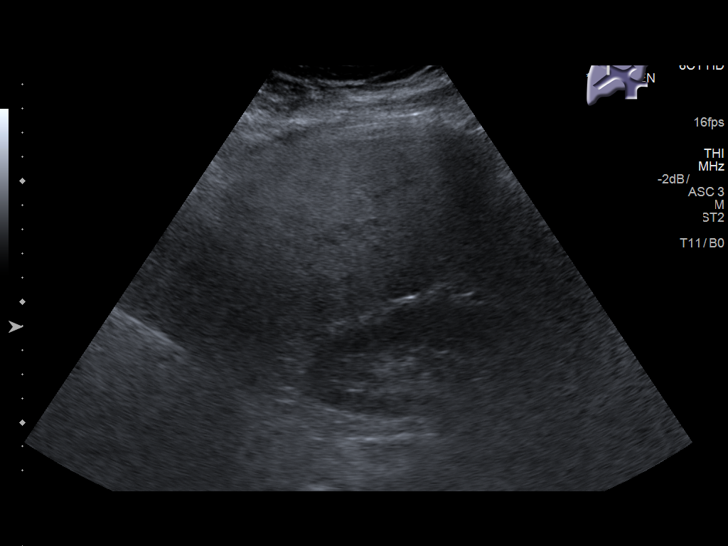
[im 64/127]
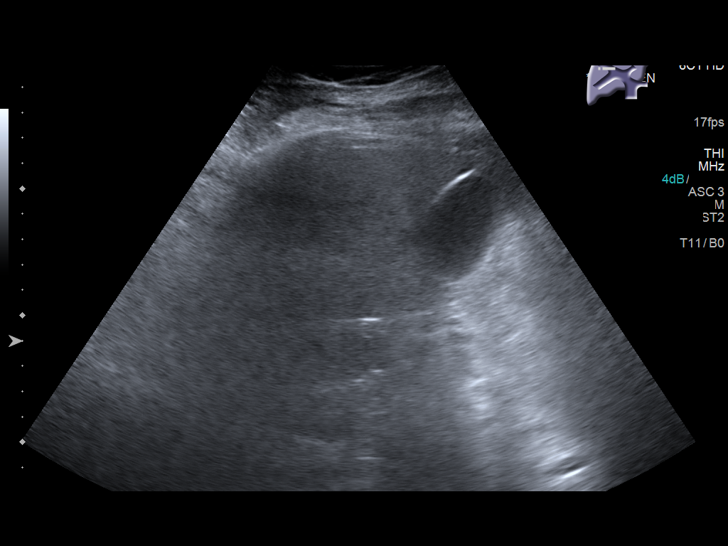
[im 74/127]
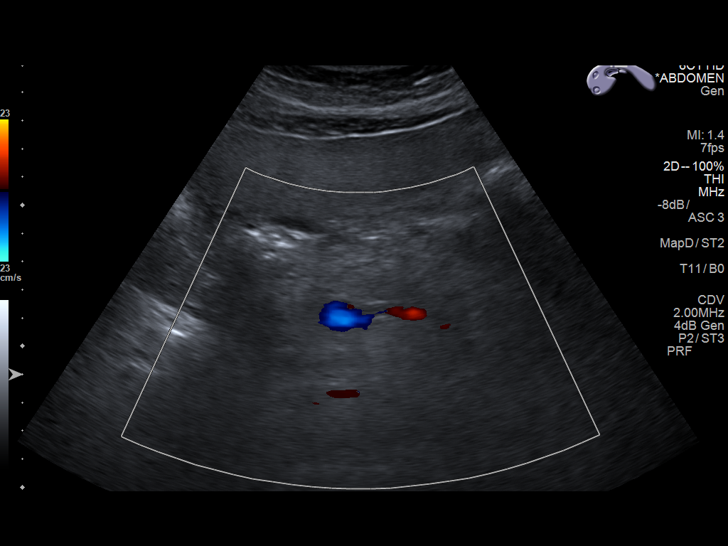
[im 85/127]
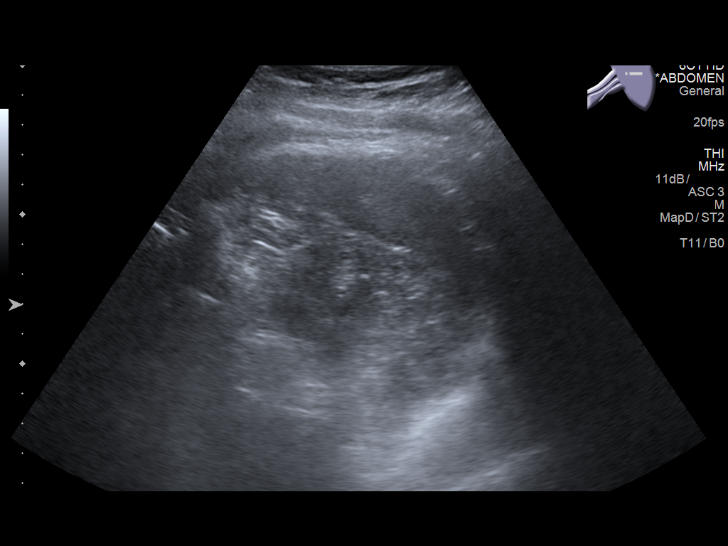
[im 95/127]
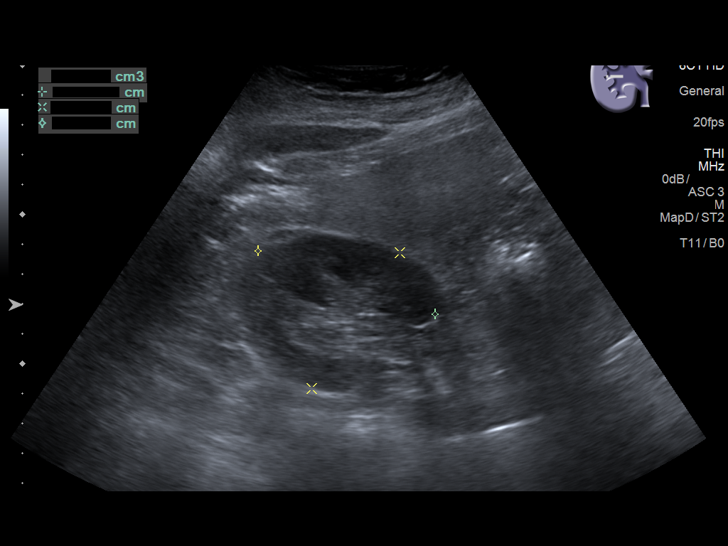
[im 106/127]
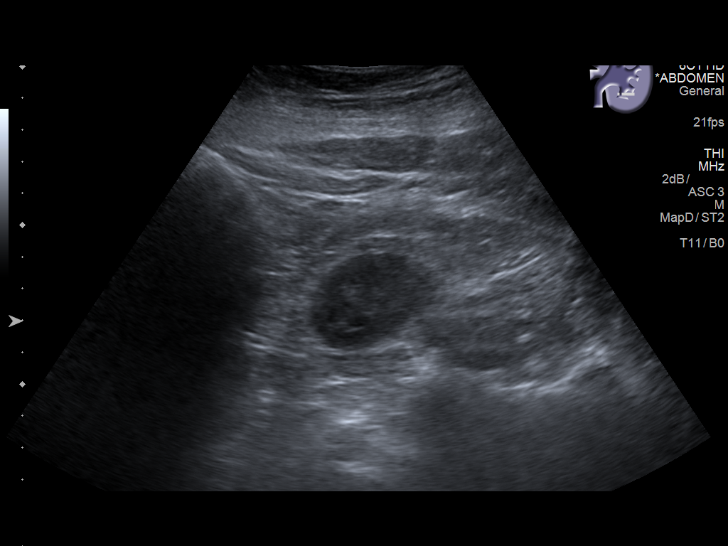
[im 116/127]
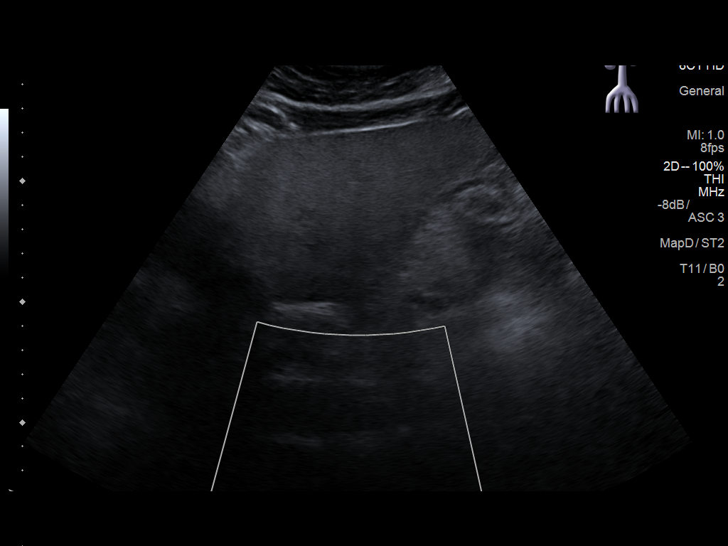
[im 127/127]
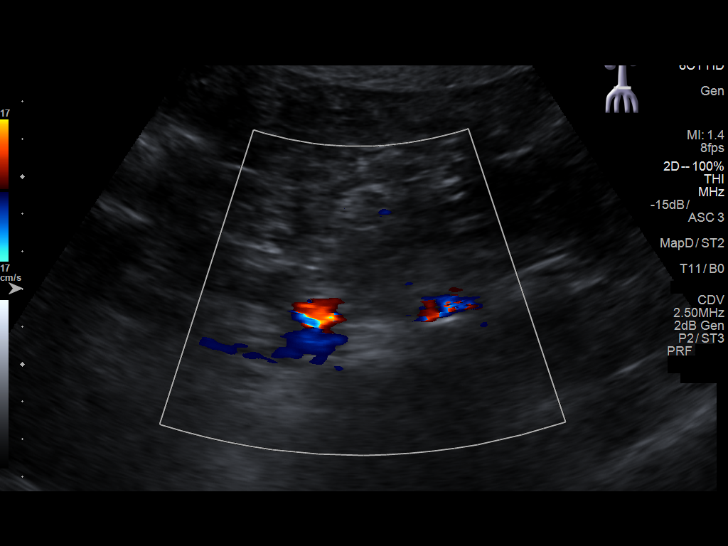

[13 of 25 positions shown; findings below may reference images not displayed]

FINDINGS: Gallbladder: The gallbladder is visualized and there are small
gallstones present, the largest of 8 mm in diameter. There is no
pain over the gallbladder with compression.

Common bile duct: Diameter: The common bile duct is normal measuring
4.1 mm in diameter.

Liver: The parenchyma of the liver is echogenic and inhomogeneous
consistent with hepatic steatosis. No focal hepatic abnormality is
seen. Portal vein is patent on color Doppler imaging with normal
direction of blood flow towards the liver.

IVC: No abnormality visualized.

Pancreas: The midportion of the pancreas appears normal. Much of the
head and tail of the pancreas is obscured by bowel gas.

Spleen: The spleen measures 7.9 cm.

Right Kidney: Length: 13.2 cm.. No hydronephrosis is seen. The
echogenicity of the renal parenchyma is normal.

Left Kidney: Length: 12.8 cm.. No hydronephrosis is seen. The renal
parenchymal echogenicity is normal.

Abdominal aorta: The abdominal aorta is normal in caliber with
abdominal aortic atherosclerosis noted.

Other findings: None.
IMPRESSION: 1. Echogenic inhomogeneous liver parenchyma consistent with hepatic
steatosis. No focal hepatic abnormality.
2. Multiple small gallstones, none larger than 8 mm in diameter. No
pain over the gallbladder currently.
3. Portions of the head and tail of the pancreas are obscured by
bowel gas.
4. Abdominal aortic atherosclerosis.

## 2018-12-08 ENCOUNTER — Encounter: Payer: Self-pay | Admitting: Family Medicine

## 2018-12-08 ENCOUNTER — Other Ambulatory Visit: Payer: Self-pay

## 2018-12-08 ENCOUNTER — Ambulatory Visit (INDEPENDENT_AMBULATORY_CARE_PROVIDER_SITE_OTHER): Payer: Medicare Other | Admitting: Family Medicine

## 2018-12-08 VITALS — BP 120/59 | HR 65 | Temp 98.5°F | Resp 16 | Ht 63.0 in | Wt 230.6 lb

## 2018-12-08 DIAGNOSIS — E8881 Metabolic syndrome: Secondary | ICD-10-CM | POA: Diagnosis not present

## 2018-12-08 DIAGNOSIS — E538 Deficiency of other specified B group vitamins: Secondary | ICD-10-CM | POA: Diagnosis not present

## 2018-12-08 DIAGNOSIS — R1030 Lower abdominal pain, unspecified: Secondary | ICD-10-CM

## 2018-12-08 DIAGNOSIS — E782 Mixed hyperlipidemia: Secondary | ICD-10-CM

## 2018-12-08 DIAGNOSIS — R739 Hyperglycemia, unspecified: Secondary | ICD-10-CM | POA: Diagnosis not present

## 2018-12-08 DIAGNOSIS — E559 Vitamin D deficiency, unspecified: Secondary | ICD-10-CM

## 2018-12-08 DIAGNOSIS — Z0000 Encounter for general adult medical examination without abnormal findings: Secondary | ICD-10-CM

## 2018-12-08 DIAGNOSIS — I10 Essential (primary) hypertension: Secondary | ICD-10-CM | POA: Diagnosis not present

## 2018-12-08 DIAGNOSIS — E038 Other specified hypothyroidism: Secondary | ICD-10-CM | POA: Diagnosis not present

## 2018-12-08 MED ORDER — FUROSEMIDE 20 MG PO TABS: ORAL_TABLET | ORAL | 1 refills | Status: DC

## 2018-12-08 NOTE — Patient Instructions (Signed)
Preventive Care 66 Years and Older, Female Preventive care refers to lifestyle choices and visits with your health care provider that can promote health and wellness. This includes:  A yearly physical exam. This is also called an annual well check.  Regular dental and eye exams.  Immunizations.  Screening for certain conditions.  Healthy lifestyle choices, such as diet and exercise. What can I expect for my preventive care visit? Physical exam Your health care provider will check:  Height and weight. These may be used to calculate body mass index (BMI), which is a measurement that tells if you are at a healthy weight.  Heart rate and blood pressure.  Your skin for abnormal spots. Counseling Your health care provider may ask you questions about:  Alcohol, tobacco, and drug use.  Emotional well-being.  Home and relationship well-being.  Sexual activity.  Eating habits.  History of falls.  Memory and ability to understand (cognition).  Work and work Statistician.  Pregnancy and menstrual history. What immunizations do I need?  Influenza (flu) vaccine  This is recommended every year. Tetanus, diphtheria, and pertussis (Tdap) vaccine  You may need a Td booster every 10 years. Varicella (chickenpox) vaccine  You may need this vaccine if you have not already been vaccinated. Zoster (shingles) vaccine  You may need this after age 66. Pneumococcal conjugate (PCV13) vaccine  One dose is recommended after age 66. Pneumococcal polysaccharide (PPSV23) vaccine  One dose is recommended after age 66. Measles, mumps, and rubella (MMR) vaccine  You may need at least one dose of MMR if you were born in 1957 or later. You may also need a second dose. Meningococcal conjugate (MenACWY) vaccine  You may need this if you have certain conditions. Hepatitis A vaccine  You may need this if you have certain conditions or if you travel or work in places where you may be exposed  to hepatitis A. Hepatitis B vaccine  You may need this if you have certain conditions or if you travel or work in places where you may be exposed to hepatitis B. Haemophilus influenzae type b (Hib) vaccine  You may need this if you have certain conditions. You may receive vaccines as individual doses or as more than one vaccine together in one shot (combination vaccines). Talk with your health care provider about the risks and benefits of combination vaccines. What tests do I need? Blood tests  Lipid and cholesterol levels. These may be checked every 5 years, or more frequently depending on your overall health.  Hepatitis C test.  Hepatitis B test. Screening  Lung cancer screening. You may have this screening every year starting at age 66 if you have a 30-pack-year history of smoking and currently smoke or have quit within the past 15 years.  Colorectal cancer screening. All adults should have this screening starting at age 66 and continuing until age 15. and continuing until age 15. Your health care provider may recommend screening at age 23 if you are at increased risk. You will have tests every 1-10 years, depending on your results and the type of screening test.  Diabetes screening. This is done by checking your blood sugar (glucose) after you have not eaten for a while (fasting). You may have this done every 1-3 years.  Mammogram. This may be done every 1-2 years. Talk with your health care provider about how often you should have regular mammograms.  BRCA-related cancer screening. This may be done if you have a family history of breast, ovarian, tubal, or peritoneal cancers.  Other tests  Sexually transmitted disease (STD) testing.  Bone density scan. This is done to screen for osteoporosis. You may have this done starting at age 55. Follow these instructions at home: Eating and drinking  Eat a diet that includes fresh fruits and vegetables, whole grains, lean protein, and low-fat dairy products. Limit  your intake of foods with high amounts of sugar, saturated fats, and salt.  Take vitamin and mineral supplements as recommended by your health care provider.  Do not drink alcohol if your health care provider tells you not to drink.  If you drink alcohol: ? Limit how much you have to 0-1 drink a day. ? Be aware of how much alcohol is in your drink. In the [place] one drink equals one 12 oz bottle of beer (355 mL), one 5 oz glass of wine (148 mL), or one 1 oz glass of hard liquor (44 mL). Lifestyle  Take daily care of your teeth and gums.  Stay active. Exercise for at least 30 minutes on 5 or more days each week.  Do not use any products that contain nicotine or tobacco, such as cigarettes, e-cigarettes, and chewing tobacco. If you need help quitting, ask your health care provider.  If you are sexually active, practice safe sex. Use a condom or other form of protection in order to prevent STIs (sexually transmitted infections).  Talk with your health care provider about taking a low-dose aspirin or statin. What's next?  Go to your health care provider once a year for a well check visit.  Ask your health care provider how often you should have your eyes and teeth checked.  Stay up to date on all vaccines. This information is not intended to replace advice given to you by your health care provider. Make sure you discuss any questions you have with your health care provider. Document Released: 06/21/2015 Document Revised: 05/19/2018 Document Reviewed: 05/19/2018 Elsevier Patient Education  2020 [employer].

## 2018-12-09 LAB — COMPREHENSIVE METABOLIC PANEL
AG Ratio: 1.6 (calc) (ref 1.0–2.5)
ALT: 21 U/L (ref 6–29)
AST: 21 U/L (ref 10–35)
Albumin: 4.4 g/dL (ref 3.6–5.1)
Alkaline phosphatase (APISO): 45 U/L (ref 37–153)
BUN/Creatinine Ratio: 13 (calc) (ref 6–22)
BUN: 14 mg/dL (ref 7–25)
CO2: 27 mmol/L (ref 20–32)
Calcium: 9.6 mg/dL (ref 8.6–10.4)
Chloride: 103 mmol/L (ref 98–110)
Creat: 1.05 mg/dL — ABNORMAL HIGH (ref 0.50–0.99)
Globulin: 2.7 g/dL (calc) (ref 1.9–3.7)
Glucose, Bld: 90 mg/dL (ref 65–99)
Potassium: 4.4 mmol/L (ref 3.5–5.3)
Sodium: 138 mmol/L (ref 135–146)
Total Bilirubin: 0.5 mg/dL (ref 0.2–1.2)
Total Protein: 7.1 g/dL (ref 6.1–8.1)

## 2018-12-09 LAB — HEMOGLOBIN A1C
Hgb A1c MFr Bld: 5.5 % of total Hgb (ref <5.7)
Mean Plasma Glucose: 111 (calc)
eAG (mmol/L): 6.2 (calc)

## 2018-12-09 LAB — LIPID PANEL
Cholesterol: 263 mg/dL — ABNORMAL HIGH (ref <200)
HDL: 39 mg/dL — ABNORMAL LOW (ref >=50)
LDL Cholesterol (Calc): 183 mg/dL (calc) — ABNORMAL HIGH
Non-HDL Cholesterol (Calc): 224 mg/dL (calc) — ABNORMAL HIGH (ref <130)
Total CHOL/HDL Ratio: 6.7 (calc) — ABNORMAL HIGH (ref <5.0)
Triglycerides: 216 mg/dL — ABNORMAL HIGH (ref <150)

## 2018-12-09 LAB — VITAMIN D 25 HYDROXY (VIT D DEFICIENCY, FRACTURES): Vit D, 25-Hydroxy: 39 ng/mL (ref 30–100)

## 2018-12-09 LAB — CBC
HCT: 40.3 % (ref 35.0–45.0)
Hemoglobin: 13.5 g/dL (ref 11.7–15.5)
MCH: 30.1 pg (ref 27.0–33.0)
MCHC: 33.5 g/dL (ref 32.0–36.0)
MCV: 90 fL (ref 80.0–100.0)
MPV: 10.5 fL (ref 7.5–12.5)
Platelets: 277 10*3/uL (ref 140–400)
RBC: 4.48 10*6/uL (ref 3.80–5.10)
RDW: 13 % (ref 11.0–15.0)
WBC: 4 10*3/uL (ref 3.8–10.8)

## 2018-12-09 LAB — TSH: TSH: 51.11 mIU/L — ABNORMAL HIGH (ref 0.40–4.50)

## 2018-12-09 LAB — VITAMIN B12: Vitamin B-12: 598 pg/mL (ref 200–1100)

## 2018-12-11 NOTE — Assessment & Plan Note (Signed)
Supplement and monitor 

## 2018-12-11 NOTE — Assessment & Plan Note (Signed)
hgba1c acceptable, minimize simple carbs. Increase exercise as tolerated.  

## 2018-12-11 NOTE — Progress Notes (Signed)
Subjective:    Patient ID: Destiny [name], female    DOB: 07-17-1950, 68 y.o.   MRN: [id number]  Chief Complaint  Patient presents with  . Annual Exam  . Foot Swelling    HPI Patient is in today for annual preventative exam and follow up on chronic medical concerns including hypothyroidism, vitamin D deficiency, hyperlipidemia, obesity and more. No recent febrile illness or hospitalizations. No polyruia or polydipsia. Is maintaining quarantine and following masking guidelines when she has to go out. Denies CP/palp/SOB/HA/congestion/fevers/GI or GU c/o. Taking meds as prescribed  Past Medical History:  Diagnosis Date  . Abnormal blood chemistry   . Arthropathy   . Back pain   . Breast cyst   . Cellulitis   . Chest discomfort   . Constipation   . Disturbance of skin sensation   . Edema   . Encounter for screening and preventative care 06/17/2016  . GERD (gastroesophageal reflux disease)   . Hiatal hernia with gastroesophageal reflux 06/17/2016  . High risk medication use   . Hoarseness 06/15/2016  . Hyperglycemia   . Hyperglycemia 12/21/2016  . Hyperlipidemia   . Hyperlipidemia, mixed 06/15/2016  . Hypertension   . Hypopotassemia   . Hypothyroidism   . Joint pain   . Menopause   . Obesity   . Palpitations   . Positive ANA (antinuclear antibody)   . Shoulder pain, right   . Sleep apnea   . SOB (shortness of breath)   . Thyroid disease 12/11/2014  . Thyroid nodule   . Urinary incontinence 04/01/2017  . Vitamin D deficiency   . Vitamin D deficiency     Past Surgical History:  Procedure Laterality Date  . BREAST CYST ASPIRATION Left    30 years ago  1970's or 1980's  . INTRAVASCULAR PRESSURE WIRE/FFR STUDY N/A 04/25/2018   Procedure: INTRAVASCULAR PRESSURE WIRE/FFR STUDY;  Surgeon: Jettie Booze, MD;  Location: [facility];  Service: Cardiovascular;  Laterality: N/A;  . LEFT HEART CATH AND CORONARY ANGIOGRAPHY N/A 04/25/2018   Procedure: LEFT HEART CATH AND  CORONARY ANGIOGRAPHY;  Surgeon: Jettie Booze, MD;  Location: [facility];  Service: Cardiovascular;  Laterality: N/A;  . LIPOSUCTION  1990   over gluteal area   . WISDOM TOOTH EXTRACTION     and all upper teeth    Family History  Problem Relation Age of Onset  . Irregular heart beat Mother   . Heart disease Mother   . Rheumatic fever Mother   . Hypertension Father   . Ulcers Father   . Hyperlipidemia Father   . Vision loss Brother   . Stroke Maternal Grandmother   . Diabetes Maternal Grandmother   . Hypertension Paternal Grandmother   . Ulcers Paternal Grandfather     Social History   Socioeconomic History  . Marital status: Married    Spouse name: Elika Godar  . Number of children: Not on file  . Years of education: Not on file  . Highest education level: Not on file  Occupational History  . Occupation: homemaker  Social Needs  . Financial resource strain: Not on file  . Food insecurity    Worry: Not on file    Inability: Not on file  . Transportation needs    Medical: Not on file    Non-medical: Not on file  Tobacco Use  . Smoking status: Former Smoker    Packs/day: 1.00    Years: 27.00    Pack years:  27.00    Types: Cigarettes    Quit date: 2011    Years since quitting: 9.5  . Smokeless tobacco: Never Used  Substance and Sexual Activity  . Alcohol use: Yes    Alcohol/week: 0.0 standard drinks  . Drug use: No  . Sexual activity: Not Currently    Birth control/protection: Post-menopausal    Comment: menopause around age [age]  Lifestyle  . Physical activity    Days per week: Not on file    Minutes per session: Not on file  . Stress: Not on file  Relationships  . Social Herbalist on phone: Not on file    Gets together: Not on file    Attends religious service: Not on file    Active member of club or organization: Not on file    Attends meetings of clubs or organizations: Not on file    Relationship status: Not on file  .  Intimate partner violence    Fear of current or ex partner: Not on file    Emotionally abused: Not on file    Physically abused: Not on file    Forced sexual activity: Not on file  Other Topics Concern  . Not on file  Social History Narrative   Occasional alcohol use: hard liquor, wine and beer    Outpatient Medications Prior to Visit  Medication Sig Dispense Refill  . albuterol (VENTOLIN HFA) 108 (90 Base) MCG/ACT inhaler Inhale 2 puffs into the lungs every 6 (six) hours as needed for wheezing or shortness of breath. 18 g 5  . COD LIVER OIL PO Take 1 capsule by mouth daily. Reported on 07/16/2015    . Cyanocobalamin (VITAMIN B-12) 500 MCG LOZG Take 500 mcg by mouth daily.     . irbesartan (AVAPRO) 150 MG tablet Take 1 tablet (150 mg total) by mouth daily. 90 tablet 1  . levothyroxine (SYNTHROID, LEVOTHROID) 75 MCG tablet Take 1 tablet (75 mcg total) by mouth daily. 90 tablet 3  . metFORMIN (GLUCOPHAGE) 500 MG tablet Take 1 tablet (500 mg total) by mouth 2 (two) times daily. 180 tablet 1  . metoprolol succinate (TOPROL-XL) 25 MG 24 hr tablet Take 1 tablet (25 mg total) by mouth daily. 90 tablet 1  . nitroGLYCERIN (NITROSTAT) 0.4 MG SL tablet Place 1 tablet (0.4 mg total) under the tongue every 5 (five) minutes as needed for chest pain. 25 tablet 3  . Probiotic Product (PROBIOTIC-10) CAPS Take 1 capsule by mouth daily.     . RED YEAST RICE EXTRACT PO Take 1 capsule by mouth daily. Reported on 07/16/2015    . Vitamin D, Ergocalciferol, (DRISDOL) 1.25 MG (50000 UT) CAPS capsule TAKE 1 CAPSULE BY MOUTH EVERY 7 DAYS 12 capsule 4  . Wheat Dextrin (BENEFIBER DRINK MIX PO) Take 1 capsule by mouth daily.     . furosemide (LASIX) 20 MG tablet 1 tab po daily x 5 days and then 1 tab po daily prn weight gain>3#/24 hours, SOB or swelling 90 tablet 0   No facility-administered medications prior to visit.     Allergies  Allergen Reactions  . Crestor [Rosuvastatin Calcium] Other (See Comments)     myalgias  . Diclofenac Itching, Nausea Only and Other (See Comments)    Wheezing  . Hydrochlorothiazide     Wheezing. Losing teeth.  . Statins     Review of Systems  Constitutional: Negative for chills, fever and malaise/fatigue.  HENT: Negative for congestion and hearing loss.  Eyes: Negative for discharge.  Respiratory: Negative for cough, sputum production and shortness of breath.   Cardiovascular: Negative for chest pain, palpitations and leg swelling.  Gastrointestinal: Negative for abdominal pain, blood in stool, constipation, diarrhea, heartburn, nausea and vomiting.  Genitourinary: Negative for dysuria, frequency, hematuria and urgency.  Musculoskeletal: Negative for back pain, falls and myalgias.  Skin: Negative for rash.  Neurological: Negative for dizziness, sensory change, loss of consciousness, weakness and headaches.  Endo/Heme/Allergies: Negative for environmental allergies. Does not bruise/bleed easily.  Psychiatric/Behavioral: Negative for depression and suicidal ideas. The patient is not nervous/anxious and does not have insomnia.        Objective:    Physical Exam Constitutional:      General: She is not in acute distress.    Appearance: She is not diaphoretic.  HENT:     Head: Normocephalic and atraumatic.     Right Ear: External ear normal.     Left Ear: External ear normal.     Nose: Nose normal.     Mouth/Throat:     Pharynx: No oropharyngeal exudate.  Eyes:     General: No scleral icterus.       Right eye: No discharge.        Left eye: No discharge.     Conjunctiva/sclera: Conjunctivae normal.     Pupils: Pupils are equal, round, and reactive to light.  Neck:     Musculoskeletal: Normal range of motion and neck supple.     Thyroid: No thyromegaly.  Cardiovascular:     Rate and Rhythm: Normal rate and regular rhythm.     Heart sounds: Normal heart sounds. No murmur.  Pulmonary:     Effort: Pulmonary effort is normal. No respiratory distress.      Breath sounds: Normal breath sounds. No wheezing or rales.  Abdominal:     General: Bowel sounds are normal. There is no distension.     Palpations: Abdomen is soft. There is no mass.     Tenderness: There is no abdominal tenderness.  Musculoskeletal: Normal range of motion.        General: No tenderness.  Lymphadenopathy:     Cervical: No cervical adenopathy.  Skin:    General: Skin is warm and dry.     Findings: No rash.  Neurological:     Mental Status: She is alert and oriented to person, place, and time.     Cranial Nerves: No cranial nerve deficit.     Coordination: Coordination normal.     Deep Tendon Reflexes: Reflexes are normal and symmetric. Reflexes normal.     BP (!) 120/59 (BP Location: Right Arm, Patient Position: Sitting, Cuff Size: Large)   Pulse 65   Temp 98.5 F (36.9 C) (Oral)   Resp 16   Ht 5\' 3"  (1.6 m)   Wt 230 lb 9.6 oz (104.6 kg)   SpO2 97%   BMI 40.85 kg/m  Wt Readings from Last 3 Encounters:  12/08/18 230 lb 9.6 oz (104.6 kg)  08/11/18 233 lb 3.2 oz (105.8 kg)  05/13/18 220 lb (99.8 kg)    Diabetic Foot Exam - Simple   No data filed     Lab Results  Component Value Date   WBC 4.0 12/08/2018   HGB 13.5 12/08/2018   HCT 40.3 12/08/2018   PLT 277 12/08/2018   GLUCOSE 90 12/08/2018   CHOL 263 (H) 12/08/2018   TRIG 216 (H) 12/08/2018   HDL 39 (L) 12/08/2018   LDLDIRECT 205.0 08/11/2018  LDLCALC 183 (H) 12/08/2018   ALT 21 12/08/2018   AST 21 12/08/2018   NA 138 12/08/2018   K 4.4 12/08/2018   CL 103 12/08/2018   CREATININE 1.05 (H) 12/08/2018   BUN 14 12/08/2018   CO2 27 12/08/2018   TSH 51.11 (H) 12/08/2018   HGBA1C 5.5 12/08/2018    Lab Results  Component Value Date   TSH 51.11 (H) 12/08/2018   Lab Results  Component Value Date   WBC 4.0 12/08/2018   HGB 13.5 12/08/2018   HCT 40.3 12/08/2018   MCV 90.0 12/08/2018   PLT 277 12/08/2018   Lab Results  Component Value Date   NA 138 12/08/2018   K 4.4 12/08/2018    CO2 27 12/08/2018   GLUCOSE 90 12/08/2018   BUN 14 12/08/2018   CREATININE 1.05 (H) 12/08/2018   BILITOT 0.5 12/08/2018   ALKPHOS 47 08/11/2018   AST 21 12/08/2018   ALT 21 12/08/2018   PROT 7.1 12/08/2018   ALBUMIN 4.8 08/11/2018   CALCIUM 9.6 12/08/2018   GFR 49.20 (L) 08/11/2018   Lab Results  Component Value Date   CHOL 263 (H) 12/08/2018   Lab Results  Component Value Date   HDL 39 (L) 12/08/2018   Lab Results  Component Value Date   LDLCALC 183 (H) 12/08/2018   Lab Results  Component Value Date   TRIG 216 (H) 12/08/2018   Lab Results  Component Value Date   CHOLHDL 6.7 (H) 12/08/2018   Lab Results  Component Value Date   HGBA1C 5.5 12/08/2018       Assessment & Plan:   Problem List Items Addressed This Visit    Hypothyroidism - Primary (Chronic)    On Levothyroxine, continue to monitor      Essential hypertension    Encouraged to check vitals weekly and notify us if any concerns developed. no changes to meds. Encouraged heart healthy diet such as the DASH diet and exercise as tolerated.       Relevant Medications   furosemide (LASIX) 20 MG tablet   Other Relevant Orders   CBC (Completed)   Comprehensive metabolic panel (Completed)   TSH (Completed)   Preventative health care    Patient encouraged to maintain heart healthy diet, regular exercise, adequate sleep. Consider daily probiotics. Take medications as prescribed      Mixed hyperlipidemia    Encouraged heart healthy diet, increase exercise, avoid trans fats, consider a krill oil cap daily      Relevant Medications   furosemide (LASIX) 20 MG tablet   Other Relevant Orders   Lipid panel (Completed)   Vitamin D deficiency    Supplement and monitor      Relevant Orders   VITAMIN D 25 Hydroxy (Vit-D Deficiency, Fractures) (Completed)   Morbid obesity ([place])    Encouraged DASH diet, decrease po intake and increase exercise as tolerated. Needs 7-8 hours of sleep nightly. Avoid trans fats,  eat small, frequent meals every 4-5 hours with lean proteins, complex carbs and healthy fats. Minimize simple carbs, is considering a new diet.       Insulin resistance    hgba1c acceptable, minimize simple carbs. Increase exercise as tolerated.       Relevant Orders   Hemoglobin A1c (Completed)   Abdominal pain   Vitamin B12 deficiency    Supplement and monitor      Relevant Orders   Vitamin B12 (Completed)    Other Visit Diagnoses    Hyperglycemia  Relevant Orders   Hemoglobin A1c (Completed)      I am having Chrishelle A. Reish maintain her COD LIVER OIL PO, RED YEAST RICE EXTRACT PO, Vitamin B-12, Probiotic-10, Wheat Dextrin (BENEFIBER DRINK MIX PO), nitroGLYCERIN, irbesartan, metoprolol succinate, Vitamin D (Ergocalciferol), metFORMIN, levothyroxine, albuterol, and furosemide.  Meds ordered this encounter  Medications  . furosemide (LASIX) 20 MG tablet    Sig: 1 tab po daily x 5 days and then 1 tab po daily prn weight gain>3#/24 hours, SOB or swelling    Dispense:  90 tablet    Refill:  1     Penni Homans, MD

## 2018-12-11 NOTE — Assessment & Plan Note (Signed)
On Levothyroxine, continue to monitor 

## 2018-12-11 NOTE — Assessment & Plan Note (Signed)
Encouraged to check vitals weekly and notify us if any concerns developed. no changes to meds. Encouraged heart healthy diet such as the DASH diet and exercise as tolerated.

## 2018-12-11 NOTE — Assessment & Plan Note (Signed)
Patient encouraged to maintain heart healthy diet, regular exercise, adequate sleep. Consider daily probiotics. Take medications as prescribed 

## 2018-12-11 NOTE — Assessment & Plan Note (Signed)
Encouraged heart healthy diet, increase exercise, avoid trans fats, consider a krill oil cap daily 

## 2018-12-11 NOTE — Assessment & Plan Note (Signed)
Encouraged DASH diet, decrease po intake and increase exercise as tolerated. Needs 7-8 hours of sleep nightly. Avoid trans fats, eat small, frequent meals every 4-5 hours with lean proteins, complex carbs and healthy fats. Minimize simple carbs, is considering a new diet.

## 2018-12-12 DIAGNOSIS — E119 Type 2 diabetes mellitus without complications: Secondary | ICD-10-CM | POA: Diagnosis not present

## 2018-12-20 MED ORDER — LEVOTHYROXINE SODIUM 100 MCG PO TABS: 100.0000 ug | ORAL_TABLET | Freq: Every day | ORAL | 3 refills | Status: DC

## 2018-12-20 NOTE — Addendum Note (Signed)
Addended by: Magdalene Molly A on: 12/20/2018 10:09 AM   Modules accepted: Orders

## 2018-12-22 ENCOUNTER — Other Ambulatory Visit: Payer: Self-pay | Admitting: Family Medicine

## 2018-12-22 DIAGNOSIS — I10 Essential (primary) hypertension: Secondary | ICD-10-CM

## 2019-02-01 ENCOUNTER — Encounter: Payer: Self-pay | Admitting: Internal Medicine

## 2019-02-01 ENCOUNTER — Ambulatory Visit (INDEPENDENT_AMBULATORY_CARE_PROVIDER_SITE_OTHER): Payer: Medicare Other | Admitting: Internal Medicine

## 2019-02-01 ENCOUNTER — Other Ambulatory Visit: Payer: Self-pay

## 2019-02-01 VITALS — BP 130/88 | HR 60 | Temp 97.4°F | Ht 63.0 in | Wt 219.4 lb

## 2019-02-01 DIAGNOSIS — I10 Essential (primary) hypertension: Secondary | ICD-10-CM

## 2019-02-01 DIAGNOSIS — E782 Mixed hyperlipidemia: Secondary | ICD-10-CM

## 2019-02-01 DIAGNOSIS — I2510 Atherosclerotic heart disease of native coronary artery without angina pectoris: Secondary | ICD-10-CM

## 2019-02-01 DIAGNOSIS — Z789 Other specified health status: Secondary | ICD-10-CM | POA: Diagnosis not present

## 2019-02-01 NOTE — Progress Notes (Signed)
LIPID CLINIC CONSULT NOTE  Chief Complaint:  Manage dyslipidemia  Primary Care Physician: Mosie Lukes, MD  Primary Cardiologist:  No primary care provider on file.  HPI:  Destiny [name] is a 68 y.o. female who is being seen today for the evaluation of dyslipidemia at the request of Mosie Lukes, MD. This is a pleasant 67 year old female with past medical history significant for moderate obesity, hypertension, dyslipidemia, hypothyroidism, palpitations and prior chest pain which led to heart catheterization in November 2019.  This demonstrated a 50% proximal circumflex lesion which was not significant by FFR, a small 75% ostial diagonal lesion as well as a mid LAD lesion of 25% stenosis.  LVEDP was elevated at 20 mmHg and LVEF was normal.  Based on these findings aggressive medical therapy was recommended.  At that time her chest pain seemed somewhat atypical and according to her still persists.  It feels like a twinge and is present at rest and with exertion.  She was however found to have a markedly abnormal lipid profile.  In addition she is struggled with longstanding hypothyroidism and recently had converted from Armour Thyroid over to Synthroid.  Her dose was increased up to 100 mcg daily by her PCP in July.  Was a month ago which showed total cholesterol 263, HDL 39, triglycerides 216, and LDL 183.  He has had triglycerides over 400 in the past.  She reports her diet has been generally unhealthy and does eat a lot of fast food, fried foods and saturated fats.  Recently she started the 56day diet which she says is good so far and she feels like she would likely see a change in her lipids.  She also has a history of statin intolerance.  She has been tried on both rosuvastatin, atorvastatin, and ezetimibe, all of which have caused significant worsening of myalgias.  PMHx:  Past Medical History:  Diagnosis Date  . Abnormal blood chemistry   . Arthropathy   . Back pain   . Breast  cyst   . Cellulitis   . Chest discomfort   . Constipation   . Disturbance of skin sensation   . Edema   . Encounter for screening and preventative care 06/17/2016  . GERD (gastroesophageal reflux disease)   . Hiatal hernia with gastroesophageal reflux 06/17/2016  . High risk medication use   . Hoarseness 06/15/2016  . Hyperglycemia   . Hyperglycemia 12/21/2016  . Hyperlipidemia   . Hyperlipidemia, mixed 06/15/2016  . Hypertension   . Hypopotassemia   . Hypothyroidism   . Joint pain   . Menopause   . Obesity   . Palpitations   . Positive ANA (antinuclear antibody)   . Shoulder pain, right   . Sleep apnea   . SOB (shortness of breath)   . Thyroid disease 12/11/2014  . Thyroid nodule   . Urinary incontinence 04/01/2017  . Vitamin D deficiency   . Vitamin D deficiency     Past Surgical History:  Procedure Laterality Date  . BREAST CYST ASPIRATION Left    30 years ago  1970's or 1980's  . INTRAVASCULAR PRESSURE WIRE/FFR STUDY N/A 04/25/2018   Procedure: INTRAVASCULAR PRESSURE WIRE/FFR STUDY;  Surgeon: Jettie Booze, MD;  Location: [facility];  Service: Cardiovascular;  Laterality: N/A;  . LEFT HEART CATH AND CORONARY ANGIOGRAPHY N/A 04/25/2018   Procedure: LEFT HEART CATH AND CORONARY ANGIOGRAPHY;  Surgeon: Jettie Booze, MD;  Location: [facility];  Service:  Cardiovascular;  Laterality: N/A;  . LIPOSUCTION  1990   over gluteal area   . WISDOM TOOTH EXTRACTION     and all upper teeth    FAMHx:  Family History  Problem Relation Age of Onset  . Irregular heart beat Mother   . Heart disease Mother   . Rheumatic fever Mother   . Hypertension Father   . Ulcers Father   . Hyperlipidemia Father   . Vision loss Brother   . Stroke Maternal Grandmother   . Diabetes Maternal Grandmother   . Hypertension Paternal Grandmother   . Ulcers Paternal Grandfather     SOCHx:   reports that she quit smoking about 9 years ago. Her smoking use included  cigarettes. She has a 27.00 pack-year smoking history. She has never used smokeless tobacco. She reports current alcohol use. She reports that she does not use drugs.  ALLERGIES:  Allergies  Allergen Reactions  . Crestor [Rosuvastatin Calcium] Other (See Comments)    myalgias  . Diclofenac Itching, Nausea Only and Other (See Comments)    Wheezing  . Hydrochlorothiazide     Wheezing. Losing teeth.  . Statins     ROS: Pertinent items noted in HPI and remainder of comprehensive ROS otherwise negative.  HOME MEDS: Current Outpatient Medications on File Prior to Visit  Medication Sig Dispense Refill  . albuterol (VENTOLIN HFA) 108 (90 Base) MCG/ACT inhaler Inhale 2 puffs into the lungs every 6 (six) hours as needed for wheezing or shortness of breath. 18 g 5  . COD LIVER OIL PO Take 1 capsule by mouth daily. Reported on 07/16/2015    . Cyanocobalamin (VITAMIN B-12) 500 MCG LOZG Take 500 mcg by mouth daily.     . irbesartan (AVAPRO) 150 MG tablet TAKE ONE TABLET BY MOUTH EVERY DAY 90 tablet 1  . levothyroxine (SYNTHROID) 100 MCG tablet Take 1 tablet (100 mcg total) by mouth daily. 90 tablet 3  . metoprolol succinate (TOPROL-XL) 25 MG 24 hr tablet TAKE ONE TABLET BY MOUTH EVERY DAY 90 tablet 1  . nitroGLYCERIN (NITROSTAT) 0.4 MG SL tablet Place 1 tablet (0.4 mg total) under the tongue every 5 (five) minutes as needed for chest pain. 25 tablet 3  . Probiotic Product (PROBIOTIC-10) CAPS Take 1 capsule by mouth daily.     . RED YEAST RICE EXTRACT PO Take 1 capsule by mouth daily. Reported on 07/16/2015    . Vitamin D, Ergocalciferol, (DRISDOL) 1.25 MG (50000 UT) CAPS capsule TAKE 1 CAPSULE BY MOUTH EVERY 7 DAYS 12 capsule 4  . Wheat Dextrin (BENEFIBER DRINK MIX PO) Take 1 capsule by mouth daily.      No current facility-administered medications on file prior to visit.     LABS/IMAGING: No results found for this or any previous visit (from the past 48 hour(s)). No results found.  LIPID PANEL:     Component Value Date/Time   CHOL 263 (H) 12/08/2018 1531   CHOL 219 (H) 11/17/2017 0936   TRIG 216 (H) 12/08/2018 1531   HDL 39 (L) 12/08/2018 1531   HDL 42 11/17/2017 0936   CHOLHDL 6.7 (H) 12/08/2018 1531   VLDL 41.6 (H) 08/11/2018 1501   LDLCALC 183 (H) 12/08/2018 1531   LDLDIRECT 205.0 08/11/2018 1501    WEIGHTS: Wt Readings from Last 3 Encounters:  02/01/19 219 lb 6.4 oz (99.5 kg)  12/08/18 230 lb 9.6 oz (104.6 kg)  08/11/18 233 lb 3.2 oz (105.8 kg)    VITALS: BP 130/88  Pulse 60   Temp (!) 97.4 F (36.3 C)   Ht 5\' 3"  (1.6 m)   Wt 219 lb 6.4 oz (99.5 kg)   SpO2 99%   BMI 38.86 kg/m   EXAM: General appearance: alert, no distress and morbidly obese Neck: no carotid bruit, no JVD and thyroid not enlarged, symmetric, no tenderness/mass/nodules Lungs: clear to auscultation bilaterally Heart: regular rate and rhythm Abdomen: soft, non-tender; bowel sounds normal; no masses,  no organomegaly and Obese Extremities: extremities normal, atraumatic, no cyanosis or edema Pulses: 2+ and symmetric Skin: Skin color, texture, turgor normal. No rashes or lesions Neurologic: Grossly normal Psych: Pleasant  EKG: Deferred  ASSESSMENT: 1. Mixed dyslipidemia with elevated LDL and triglycerides 2. Coronary artery disease with moderate to severe disease by cath (04/2018)-managed medically 3. Morbid obesity 4. Hypertension 5. Palpitations 6. Hypothyroidism  PLAN: 1.   Ms. Sangiovanni has significant coronary disease which may have a dietary component that significant.  Although her LDL is quite high, there is not a family history of significant early onset heart disease.  Hypothyroidism may have also played a role in her cholesterol abnormality and hopefully that is improved with some increase in her medications.  She is also made some dietary changes however it seems like it is early to see an impact of that.  Nonetheless she wants to retest her lipids today and she was nonfasting.   We will go ahead and repeat a lipid profile.  I feel like she is a good candidate for PCSK9 inhibitor.  She is failed to high potency statins and has a target LDL of less than 70 given her advanced coronary disease.  We will likely pursue Repatha 140 mg every 2 weeks.  Plan follow-up with me in 3 to 4 months with a lipid profile.  Thanks again for the kind referral.  Pixie Casino, MD, FACC, [place] Director of the [facility] Diplomate of the American Board of Clinical Lipidology Attending Cardiologist  Direct Dial: [phone]  Fax: [phone]  Website:  [email or web address] 02/01/2019, 12:17 PM

## 2019-02-01 NOTE — Patient Instructions (Addendum)
Medication Instructions:  NO CHANGES If you need a refill on your cardiac medications before your next appointment, please call your pharmacy.   Lab work: FASTING lab work today  If you have labs (blood work) drawn today and your tests are completely normal, you will receive your results only by: . MyChart Message (if you have MyChart) OR . A paper copy in the mail If you have any lab test that is abnormal or we need to change your treatment, we will call you to review the results.  Follow-Up: Dr. Hilty recommends that you schedule a follow up visit with him the in the LIPID CLINIC in 3-4 months.   

## 2019-02-02 LAB — LIPID PANEL
Chol/HDL Ratio: 5.3 ratio — ABNORMAL HIGH (ref 0.0–4.4)
Cholesterol, Total: 218 mg/dL — ABNORMAL HIGH (ref 100–199)
HDL: 41 mg/dL (ref >39)
LDL Calculated: 145 mg/dL — ABNORMAL HIGH (ref 0–99)
Triglycerides: 158 mg/dL — ABNORMAL HIGH (ref 0–149)
VLDL Cholesterol Cal: 32 mg/dL (ref 5–40)

## 2019-02-15 ENCOUNTER — Telehealth: Payer: Self-pay | Admitting: Internal Medicine

## 2019-02-15 NOTE — Telephone Encounter (Signed)
PA for repatha submitted via [email or web address] (Key: [id number])

## 2019-02-15 NOTE — Telephone Encounter (Signed)
Patient asked why her PA is just now being submitted. Explained that I had requested her drug coverage info via [employer] on 9/3 (which she reports that she uses) and had not received a response. I was also out of the office several days after her lipid clinic visit with MD. She is aware she will be notified via [place] once her insurance company has responded.

## 2019-02-15 NOTE — Telephone Encounter (Signed)
Patient has [employer] prescription coverage ID [id number] BIN [id number] PCN [place] [phone]

## 2019-02-20 MED ORDER — REPATHA SURECLICK 140 MG/ML ~~LOC~~ SOAJ: 1.0000 | SUBCUTANEOUS | 3 refills | Status: DC

## 2019-02-20 NOTE — Telephone Encounter (Signed)
[employer] has approved coverage of Repatha. Patient notified via MyChart message. Rx sent.

## 2019-02-20 NOTE — Addendum Note (Signed)
Addended by: Fidel Levy on: 02/20/2019 08:08 AM   Modules accepted: Orders

## 2019-02-21 ENCOUNTER — Telehealth: Payer: Self-pay | Admitting: Internal Medicine

## 2019-02-21 NOTE — Telephone Encounter (Signed)
Called patient she was not at home at the time of call, advised husband to have her call back to discuss issues.

## 2019-02-21 NOTE — Telephone Encounter (Signed)
New Message      Pt is calling to speak with the nurse, she says my chart is not working    Please call back

## 2019-02-24 NOTE — Telephone Encounter (Signed)
Patient has responded to/communicated via MyChart since this call came in. Encounter closed

## 2019-03-08 DIAGNOSIS — L821 Other seborrheic keratosis: Secondary | ICD-10-CM | POA: Diagnosis not present

## 2019-03-08 DIAGNOSIS — L570 Actinic keratosis: Secondary | ICD-10-CM | POA: Diagnosis not present

## 2019-03-08 DIAGNOSIS — B078 Other viral warts: Secondary | ICD-10-CM | POA: Diagnosis not present

## 2019-03-13 ENCOUNTER — Ambulatory Visit (INDEPENDENT_AMBULATORY_CARE_PROVIDER_SITE_OTHER): Payer: Medicare Other | Admitting: Family Medicine

## 2019-03-13 ENCOUNTER — Encounter: Payer: Self-pay | Admitting: Family Medicine

## 2019-03-13 ENCOUNTER — Other Ambulatory Visit: Payer: Self-pay

## 2019-03-13 VITALS — BP 142/72 | HR 60 | Temp 97.2°F | Resp 18 | Wt 217.8 lb

## 2019-03-13 DIAGNOSIS — R7303 Prediabetes: Secondary | ICD-10-CM | POA: Diagnosis not present

## 2019-03-13 DIAGNOSIS — I10 Essential (primary) hypertension: Secondary | ICD-10-CM | POA: Diagnosis not present

## 2019-03-13 DIAGNOSIS — E559 Vitamin D deficiency, unspecified: Secondary | ICD-10-CM

## 2019-03-13 DIAGNOSIS — E538 Deficiency of other specified B group vitamins: Secondary | ICD-10-CM | POA: Diagnosis not present

## 2019-03-13 DIAGNOSIS — E039 Hypothyroidism, unspecified: Secondary | ICD-10-CM | POA: Diagnosis not present

## 2019-03-13 DIAGNOSIS — Z23 Encounter for immunization: Secondary | ICD-10-CM | POA: Diagnosis not present

## 2019-03-13 DIAGNOSIS — I2510 Atherosclerotic heart disease of native coronary artery without angina pectoris: Secondary | ICD-10-CM | POA: Diagnosis not present

## 2019-03-13 DIAGNOSIS — E782 Mixed hyperlipidemia: Secondary | ICD-10-CM | POA: Diagnosis not present

## 2019-03-13 DIAGNOSIS — E8881 Metabolic syndrome: Secondary | ICD-10-CM | POA: Diagnosis not present

## 2019-03-13 MED ORDER — ASPIRIN EC 81 MG PO TBEC: 81.0000 mg | DELAYED_RELEASE_TABLET | Freq: Every day | ORAL | Status: DC

## 2019-03-13 NOTE — Assessment & Plan Note (Signed)
Encouraged heart healthy diet, increase exercise, avoid trans fats, consider a krill oil cap daily. Tolerating Repatha, red yeast rice. She will circle back with cardiology and make sure they are OK with her taking both

## 2019-03-13 NOTE — Assessment & Plan Note (Signed)
hgba1c acceptable, minimize simple carbs. Increase exercise as tolerated.  

## 2019-03-13 NOTE — Assessment & Plan Note (Signed)
Well controlled, no changes to meds. Encouraged heart healthy diet such as the DASH diet and exercise as tolerated.  °

## 2019-03-13 NOTE — Patient Instructions (Signed)
Omron blood pressure cuff, upper arm  Pulse oximeter wants oxygen in 90s  Check vitals weekly  Multivitamin with minerals, selenium, zinc, vitamin c and vitamin d   Hypertension, Adult High blood pressure (hypertension) is when the force of blood pumping through the arteries is too strong. The arteries are the blood vessels that carry blood from the heart throughout the body. Hypertension forces the heart to work harder to pump blood and may cause arteries to become narrow or stiff. Untreated or uncontrolled hypertension can cause a heart attack, heart failure, a stroke, kidney disease, and other problems. A blood pressure reading consists of a higher number over a lower number. Ideally, your blood pressure should be below 120/80. The first ("top") number is called the systolic pressure. It is a measure of the pressure in your arteries as your heart beats. The second ("bottom") number is called the diastolic pressure. It is a measure of the pressure in your arteries as the heart relaxes. What are the causes? The exact cause of this condition is not known. There are some conditions that result in or are related to high blood pressure. What increases the risk? Some risk factors for high blood pressure are under your control. The following factors may make you more likely to develop this condition:  Smoking.  Having type 2 diabetes mellitus, high cholesterol, or both.  Not getting enough exercise or physical activity.  Being overweight.  Having too much fat, sugar, calories, or salt (sodium) in your diet.  Drinking too much alcohol. Some risk factors for high blood pressure may be difficult or impossible to change. Some of these factors include:  Having chronic kidney disease.  Having a family history of high blood pressure.  Age. Risk increases with age.  Race. You may be at higher risk if you are African American.  Gender. Men are at higher risk than women before age 31. After  age 76, women are at higher risk than men.  Having obstructive sleep apnea.  Stress. What are the signs or symptoms? High blood pressure may not cause symptoms. Very high blood pressure (hypertensive crisis) may cause:  Headache.  Anxiety.  Shortness of breath.  Nosebleed.  Nausea and vomiting.  Vision changes.  Severe chest pain.  Seizures. How is this diagnosed? This condition is diagnosed by measuring your blood pressure while you are seated, with your arm resting on a flat surface, your legs uncrossed, and your feet flat on the floor. The cuff of the blood pressure monitor will be placed directly against the skin of your upper arm at the level of your heart. It should be measured at least twice using the same arm. Certain conditions can cause a difference in blood pressure between your right and left arms. Certain factors can cause blood pressure readings to be lower or higher than normal for a short period of time:  When your blood pressure is higher when you are in a health care provider's office than when you are at home, this is called white coat hypertension. Most people with this condition do not need medicines.  When your blood pressure is higher at home than when you are in a health care provider's office, this is called masked hypertension. Most people with this condition may need medicines to control blood pressure. If you have a high blood pressure reading during one visit or you have normal blood pressure with other risk factors, you may be asked to:  Return on a different day to  have your blood pressure checked again.  Monitor your blood pressure at home for 1 week or longer. If you are diagnosed with hypertension, you may have other blood or imaging tests to help your health care provider understand your overall risk for other conditions. How is this treated? This condition is treated by making healthy lifestyle changes, such as eating healthy foods, exercising  more, and reducing your alcohol intake. Your health care provider may prescribe medicine if lifestyle changes are not enough to get your blood pressure under control, and if:  Your systolic blood pressure is above 130.  Your diastolic blood pressure is above 80. Your personal target blood pressure may vary depending on your medical conditions, your age, and other factors. Follow these instructions at home: Eating and drinking   Eat a diet that is high in fiber and potassium, and low in sodium, added sugar, and fat. An example eating plan is called the DASH (Dietary Approaches to Stop Hypertension) diet. To eat this way: ? Eat plenty of fresh fruits and vegetables. Try to fill one half of your plate at each meal with fruits and vegetables. ? Eat whole grains, such as whole-wheat pasta, brown rice, or whole-grain bread. Fill about one fourth of your plate with whole grains. ? Eat or drink low-fat dairy products, such as skim milk or low-fat yogurt. ? Avoid fatty cuts of meat, processed or cured meats, and poultry with skin. Fill about one fourth of your plate with lean proteins, such as fish, chicken without skin, beans, eggs, or tofu. ? Avoid pre-made and processed foods. These tend to be higher in sodium, added sugar, and fat.  Reduce your daily sodium intake. Most people with hypertension should eat less than 1,500 mg of sodium a day.  Do not drink alcohol if: ? Your health care provider tells you not to drink. ? You are pregnant, may be pregnant, or are planning to become pregnant.  If you drink alcohol: ? Limit how much you use to:  0-1 drink a day for women.  0-2 drinks a day for men. ? Be aware of how much alcohol is in your drink. In the [place] one drink equals one 12 oz bottle of beer (355 mL), one 5 oz glass of wine (148 mL), or one 1 oz glass of hard liquor (44 mL). Lifestyle   Work with your health care provider to maintain a healthy body weight or to lose weight. Ask what  an ideal weight is for you.  Get at least 30 minutes of exercise most days of the week. Activities may include walking, swimming, or biking.  Include exercise to strengthen your muscles (resistance exercise), such as Pilates or lifting weights, as part of your weekly exercise routine. Try to do these types of exercises for 30 minutes at least 3 days a week.  Do not use any products that contain nicotine or tobacco, such as cigarettes, e-cigarettes, and chewing tobacco. If you need help quitting, ask your health care provider.  Monitor your blood pressure at home as told by your health care provider.  Keep all follow-up visits as told by your health care provider. This is important. Medicines  Take over-the-counter and prescription medicines only as told by your health care provider. Follow directions carefully. Blood pressure medicines must be taken as prescribed.  Do not skip doses of blood pressure medicine. Doing this puts you at risk for problems and can make the medicine less effective.  Ask your health care provider  about side effects or reactions to medicines that you should watch for. Contact a health care provider if you:  Think you are having a reaction to a medicine you are taking.  Have headaches that keep coming back (recurring).  Feel dizzy.  Have swelling in your ankles.  Have trouble with your vision. Get help right away if you:  Develop a severe headache or confusion.  Have unusual weakness or numbness.  Feel faint.  Have severe pain in your chest or abdomen.  Vomit repeatedly.  Have trouble breathing. Summary  Hypertension is when the force of blood pumping through your arteries is too strong. If this condition is not controlled, it may put you at risk for serious complications.  Your personal target blood pressure may vary depending on your medical conditions, your age, and other factors. For most people, a normal blood pressure is less than 120/80.   Hypertension is treated with lifestyle changes, medicines, or a combination of both. Lifestyle changes include losing weight, eating a healthy, low-sodium diet, exercising more, and limiting alcohol. This information is not intended to replace advice given to you by your health care provider. Make sure you discuss any questions you have with your health care provider. Document Released: 05/25/2005 Document Revised: 02/02/2018 Document Reviewed: 02/02/2018 Elsevier Patient Education  2020 [employer].

## 2019-03-14 LAB — CBC
HCT: 41.6 % (ref 36.0–46.0)
Hemoglobin: 14 g/dL (ref 12.0–15.0)
MCHC: 33.7 g/dL (ref 30.0–36.0)
MCV: 90.9 fl (ref 78.0–100.0)
Platelets: 286 10*3/uL (ref 150.0–400.0)
RBC: 4.57 Mil/uL (ref 3.87–5.11)
RDW: 13.3 % (ref 11.5–15.5)
WBC: 4.1 10*3/uL (ref 4.0–10.5)

## 2019-03-14 LAB — VITAMIN B12: Vitamin B-12: 777 pg/mL (ref 211–911)

## 2019-03-14 LAB — COMPREHENSIVE METABOLIC PANEL
ALT: 23 U/L (ref 0–35)
AST: 22 U/L (ref 0–37)
Albumin: 4.6 g/dL (ref 3.5–5.2)
Alkaline Phosphatase: 50 U/L (ref 39–117)
BUN: 13 mg/dL (ref 6–23)
CO2: 28 mEq/L (ref 19–32)
Calcium: 10 mg/dL (ref 8.4–10.5)
Chloride: 101 mEq/L (ref 96–112)
Creatinine, Ser: 0.83 mg/dL (ref 0.40–1.20)
GFR: 68.68 mL/min (ref >60.00)
Glucose, Bld: 99 mg/dL (ref 70–99)
Potassium: 4.2 mEq/L (ref 3.5–5.1)
Sodium: 138 mEq/L (ref 135–145)
Total Bilirubin: 0.5 mg/dL (ref 0.2–1.2)
Total Protein: 7.2 g/dL (ref 6.0–8.3)

## 2019-03-14 LAB — T4, FREE: Free T4: 0.9 ng/dL (ref 0.60–1.60)

## 2019-03-14 LAB — HEMOGLOBIN A1C: Hgb A1c MFr Bld: 5.6 % (ref 4.6–6.5)

## 2019-03-14 LAB — T3, FREE: T3, Free: 2.5 pg/mL (ref 2.3–4.2)

## 2019-03-14 LAB — VITAMIN D 25 HYDROXY (VIT D DEFICIENCY, FRACTURES): VITD: 61.13 ng/mL (ref 30.00–100.00)

## 2019-03-14 LAB — TSH: TSH: 22.98 u[IU]/mL — ABNORMAL HIGH (ref 0.35–4.50)

## 2019-03-15 MED ORDER — LEVOTHYROXINE SODIUM 112 MCG PO TABS: 112.0000 ug | ORAL_TABLET | Freq: Every day | ORAL | 3 refills | Status: DC

## 2019-03-15 NOTE — Assessment & Plan Note (Signed)
On Levothyroxine, continue to monitor 

## 2019-03-15 NOTE — Progress Notes (Signed)
Subjective:    Patient ID: Destiny [name], female    DOB: 1951-03-20, 68 y.o.   MRN: [id number]  No chief complaint on file.   HPI Patient is in today for follow up on chronic medical concerns including hypothyroidism, obesity, hyperlipidemia and more. No recent febrile illness or hospitalizations. No polyuria or polydipsia. Is struggling with her weight during the pandemic. Has not been as active. Denies CP/palp/SOB/HA/congestion/fevers/GI or GU c/o. Taking meds as prescribed  Past Medical History:  Diagnosis Date  . Abnormal blood chemistry   . Arthropathy   . Back pain   . Breast cyst   . Cellulitis   . Chest discomfort   . Constipation   . Disturbance of skin sensation   . Edema   . Encounter for screening and preventative care 06/17/2016  . GERD (gastroesophageal reflux disease)   . Hiatal hernia with gastroesophageal reflux 06/17/2016  . High risk medication use   . Hoarseness 06/15/2016  . Hyperglycemia   . Hyperglycemia 12/21/2016  . Hyperlipidemia   . Hyperlipidemia, mixed 06/15/2016  . Hypertension   . Hypopotassemia   . Hypothyroidism   . Joint pain   . Menopause   . Obesity   . Palpitations   . Positive ANA (antinuclear antibody)   . Shoulder pain, right   . Sleep apnea   . SOB (shortness of breath)   . Thyroid disease 12/11/2014  . Thyroid nodule   . Urinary incontinence 04/01/2017  . Vitamin D deficiency   . Vitamin D deficiency     Past Surgical History:  Procedure Laterality Date  . BREAST CYST ASPIRATION Left    30 years ago  1970's or 1980's  . INTRAVASCULAR PRESSURE WIRE/FFR STUDY N/A 04/25/2018   Procedure: INTRAVASCULAR PRESSURE WIRE/FFR STUDY;  Surgeon: Jettie Booze, MD;  Location: [facility];  Service: Cardiovascular;  Laterality: N/A;  . LEFT HEART CATH AND CORONARY ANGIOGRAPHY N/A 04/25/2018   Procedure: LEFT HEART CATH AND CORONARY ANGIOGRAPHY;  Surgeon: Jettie Booze, MD;  Location: [facility];  Service:  Cardiovascular;  Laterality: N/A;  . LIPOSUCTION  1990   over gluteal area   . WISDOM TOOTH EXTRACTION     and all upper teeth    Family History  Problem Relation Age of Onset  . Irregular heart beat Mother   . Heart disease Mother   . Rheumatic fever Mother   . Hypertension Father   . Ulcers Father   . Hyperlipidemia Father   . Vision loss Brother   . Stroke Maternal Grandmother   . Diabetes Maternal Grandmother   . Hypertension Paternal Grandmother   . Ulcers Paternal Grandfather     Social History   Socioeconomic History  . Marital status: Married    Spouse name: Tanga Kading  . Number of children: Not on file  . Years of education: Not on file  . Highest education level: Not on file  Occupational History  . Occupation: homemaker  Social Needs  . Financial resource strain: Not on file  . Food insecurity    Worry: Not on file    Inability: Not on file  . Transportation needs    Medical: Not on file    Non-medical: Not on file  Tobacco Use  . Smoking status: Former Smoker    Packs/day: 1.00    Years: 27.00    Pack years: 27.00    Types: Cigarettes    Quit date: 2011    Years since  quitting: 9.7  . Smokeless tobacco: Never Used  Substance and Sexual Activity  . Alcohol use: Yes    Alcohol/week: 0.0 standard drinks  . Drug use: No  . Sexual activity: Not Currently    Birth control/protection: Post-menopausal    Comment: menopause around age [age]  Lifestyle  . Physical activity    Days per week: Not on file    Minutes per session: Not on file  . Stress: Not on file  Relationships  . Social Herbalist on phone: Not on file    Gets together: Not on file    Attends religious service: Not on file    Active member of club or organization: Not on file    Attends meetings of clubs or organizations: Not on file    Relationship status: Not on file  . Intimate partner violence    Fear of current or ex partner: Not on file    Emotionally abused: Not  on file    Physically abused: Not on file    Forced sexual activity: Not on file  Other Topics Concern  . Not on file  Social History Narrative   Occasional alcohol use: hard liquor, wine and beer    Outpatient Medications Prior to Visit  Medication Sig Dispense Refill  . albuterol (VENTOLIN HFA) 108 (90 Base) MCG/ACT inhaler Inhale 2 puffs into the lungs every 6 (six) hours as needed for wheezing or shortness of breath. 18 g 5  . COD LIVER OIL PO Take 1 capsule by mouth daily. Reported on 07/16/2015    . Cyanocobalamin (VITAMIN B-12) 500 MCG LOZG Take 500 mcg by mouth daily.     . Evolocumab (REPATHA SURECLICK) XX123456 MG/ML SOAJ Inject 1 Dose into the skin every 14 (fourteen) days. 6 pen 3  . irbesartan (AVAPRO) 150 MG tablet TAKE ONE TABLET BY MOUTH EVERY DAY 90 tablet 1  . metoprolol succinate (TOPROL-XL) 25 MG 24 hr tablet TAKE ONE TABLET BY MOUTH EVERY DAY 90 tablet 1  . nitroGLYCERIN (NITROSTAT) 0.4 MG SL tablet Place 1 tablet (0.4 mg total) under the tongue every 5 (five) minutes as needed for chest pain. 25 tablet 3  . Probiotic Product (PROBIOTIC-10) CAPS Take 1 capsule by mouth daily.     . RED YEAST RICE EXTRACT PO Take 1 capsule by mouth daily. Reported on 07/16/2015    . Vitamin D, Ergocalciferol, (DRISDOL) 1.25 MG (50000 UT) CAPS capsule TAKE 1 CAPSULE BY MOUTH EVERY 7 DAYS 12 capsule 4  . Wheat Dextrin (BENEFIBER DRINK MIX PO) Take 1 capsule by mouth daily.     Marland Kitchen levothyroxine (SYNTHROID) 100 MCG tablet Take 1 tablet (100 mcg total) by mouth daily. 90 tablet 3   No facility-administered medications prior to visit.     Allergies  Allergen Reactions  . Crestor [Rosuvastatin Calcium] Other (See Comments)    myalgias  . Diclofenac Itching, Nausea Only and Other (See Comments)    Wheezing  . Hydrochlorothiazide     Wheezing. Losing teeth.  . Statins     Review of Systems  Constitutional: Positive for malaise/fatigue. Negative for fever.  HENT: Negative for congestion.    Eyes: Negative for blurred vision.  Respiratory: Negative for shortness of breath.   Cardiovascular: Negative for chest pain, palpitations and leg swelling.  Gastrointestinal: Negative for abdominal pain, blood in stool and nausea.  Genitourinary: Negative for dysuria and frequency.  Musculoskeletal: Negative for falls.  Skin: Negative for rash.  Neurological: Negative for  dizziness, loss of consciousness and headaches.  Endo/Heme/Allergies: Negative for environmental allergies.  Psychiatric/Behavioral: Negative for depression. The patient is nervous/anxious.        Objective:    Physical Exam Vitals signs and nursing note reviewed.  Constitutional:      General: She is not in acute distress.    Appearance: She is well-developed.  HENT:     Head: Normocephalic and atraumatic.     Nose: Nose normal.  Eyes:     General:        Right eye: No discharge.        Left eye: No discharge.  Neck:     Musculoskeletal: Normal range of motion and neck supple.  Cardiovascular:     Rate and Rhythm: Normal rate and regular rhythm.     Heart sounds: No murmur.  Pulmonary:     Effort: Pulmonary effort is normal.     Breath sounds: Normal breath sounds.  Abdominal:     General: Bowel sounds are normal.     Palpations: Abdomen is soft.     Tenderness: There is no abdominal tenderness.  Skin:    General: Skin is warm and dry.  Neurological:     Mental Status: She is alert and oriented to person, place, and time.     BP (!) 142/72 (BP Location: Left Arm, Patient Position: Sitting, Cuff Size: Normal)   Pulse 60   Temp (!) 97.2 F (36.2 C) (Temporal)   Resp 18   Wt 217 lb 12.8 oz (98.8 kg)   SpO2 98%   BMI 38.58 kg/m  Wt Readings from Last 3 Encounters:  03/13/19 217 lb 12.8 oz (98.8 kg)  02/01/19 219 lb 6.4 oz (99.5 kg)  12/08/18 230 lb 9.6 oz (104.6 kg)    Diabetic Foot Exam - Simple   No data filed     Lab Results  Component Value Date   WBC 4.1 03/13/2019   HGB 14.0  03/13/2019   HCT 41.6 03/13/2019   PLT 286.0 03/13/2019   GLUCOSE 99 03/13/2019   CHOL 218 (H) 02/01/2019   TRIG 158 (H) 02/01/2019   HDL 41 02/01/2019   LDLDIRECT 205.0 08/11/2018   LDLCALC 145 (H) 02/01/2019   ALT 23 03/13/2019   AST 22 03/13/2019   NA 138 03/13/2019   K 4.2 03/13/2019   CL 101 03/13/2019   CREATININE 0.83 03/13/2019   BUN 13 03/13/2019   CO2 28 03/13/2019   TSH 22.98 (H) 03/13/2019   HGBA1C 5.6 03/13/2019    Lab Results  Component Value Date   TSH 22.98 (H) 03/13/2019   Lab Results  Component Value Date   WBC 4.1 03/13/2019   HGB 14.0 03/13/2019   HCT 41.6 03/13/2019   MCV 90.9 03/13/2019   PLT 286.0 03/13/2019   Lab Results  Component Value Date   NA 138 03/13/2019   K 4.2 03/13/2019   CO2 28 03/13/2019   GLUCOSE 99 03/13/2019   BUN 13 03/13/2019   CREATININE 0.83 03/13/2019   BILITOT 0.5 03/13/2019   ALKPHOS 50 03/13/2019   AST 22 03/13/2019   ALT 23 03/13/2019   PROT 7.2 03/13/2019   ALBUMIN 4.6 03/13/2019   CALCIUM 10.0 03/13/2019   GFR 68.68 03/13/2019   Lab Results  Component Value Date   CHOL 218 (H) 02/01/2019   Lab Results  Component Value Date   HDL 41 02/01/2019   Lab Results  Component Value Date   LDLCALC 145 (H) 02/01/2019  Lab Results  Component Value Date   TRIG 158 (H) 02/01/2019   Lab Results  Component Value Date   CHOLHDL 5.3 (H) 02/01/2019   Lab Results  Component Value Date   HGBA1C 5.6 03/13/2019       Assessment & Plan:   Problem List Items Addressed This Visit    Hypothyroidism (Chronic)    On Levothyroxine, continue to monitor      Relevant Medications   levothyroxine (SYNTHROID) 112 MCG tablet   Other Relevant Orders   T4, free (Completed)   T3, free (Completed)   Essential hypertension    Well controlled, no changes to meds. Encouraged heart healthy diet such as the DASH diet and exercise as tolerated.       Relevant Medications   aspirin EC 81 MG tablet   Other Relevant  Orders   CBC (Completed)   Comprehensive metabolic panel (Completed)   TSH (Completed)   Mixed hyperlipidemia    Encouraged heart healthy diet, increase exercise, avoid trans fats, consider a krill oil cap daily. Tolerating Repatha, red yeast rice. She will circle back with cardiology and make sure they are OK with her taking both      Relevant Medications   aspirin EC 81 MG tablet   Vitamin D deficiency    Supplement and monitor      Relevant Orders   VITAMIN D 25 Hydroxy (Vit-D Deficiency, Fractures) (Completed)   Prediabetes    Minimize simple carbs and stay active      Relevant Orders   Hemoglobin A1c (Completed)   Morbid obesity ([place])    Encouraged DASH diet, decrease po intake and increase exercise as tolerated. Needs 7-8 hours of sleep nightly. Avoid trans fats, eat small, frequent meals every 4-5 hours with lean proteins, complex carbs and healthy fats. Minimize simple carbs, consider [employer] app      Insulin resistance    hgba1c acceptable, minimize simple carbs. Increase exercise as tolerated.       Vitamin B12 deficiency    Supplement and monitor      Relevant Orders   Vitamin B12 (Completed)    Other Visit Diagnoses    Needs flu shot    -  Primary   Relevant Orders   Flu Vaccine QUAD High Dose(Fluad) (Completed)      I have discontinued Ermelinda A. Flammia's levothyroxine. I am also having her start on aspirin EC and levothyroxine. Additionally, I am having her maintain her COD LIVER OIL PO, RED YEAST RICE EXTRACT PO, Vitamin B-12, Probiotic-10, Wheat Dextrin (BENEFIBER DRINK MIX PO), nitroGLYCERIN, Vitamin D (Ergocalciferol), albuterol, irbesartan, metoprolol succinate, and Repatha SureClick.  Meds ordered this encounter  Medications  . aspirin EC 81 MG tablet    Sig: Take 1 tablet (81 mg total) by mouth daily.    Dispense:     . levothyroxine (SYNTHROID) 112 MCG tablet    Sig: Take 1 tablet (112 mcg total) by mouth daily.    Dispense:  90 tablet    Refill:   3    D/c previous script     Penni Homans, MD

## 2019-03-15 NOTE — Assessment & Plan Note (Signed)
Encouraged DASH diet, decrease po intake and increase exercise as tolerated. Needs 7-8 hours of sleep nightly. Avoid trans fats, eat small, frequent meals every 4-5 hours with lean proteins, complex carbs and healthy fats. Minimize simple carbs, consider [employer] app

## 2019-03-15 NOTE — Assessment & Plan Note (Signed)
Supplement and monitor 

## 2019-03-15 NOTE — Assessment & Plan Note (Signed)
Minimize simple carbs and stay active

## 2019-04-03 ENCOUNTER — Other Ambulatory Visit: Payer: Self-pay | Admitting: Family Medicine

## 2019-04-03 ENCOUNTER — Encounter: Payer: Self-pay | Admitting: Family Medicine

## 2019-04-04 ENCOUNTER — Other Ambulatory Visit: Payer: Self-pay

## 2019-04-04 DIAGNOSIS — E559 Vitamin D deficiency, unspecified: Secondary | ICD-10-CM

## 2019-04-04 MED ORDER — NITROGLYCERIN 0.4 MG SL SUBL: 0.4000 mg | SUBLINGUAL_TABLET | SUBLINGUAL | 3 refills | Status: DC | PRN

## 2019-04-04 MED ORDER — VITAMIN D (ERGOCALCIFEROL) 1.25 MG (50000 UNIT) PO CAPS: ORAL_CAPSULE | ORAL | 4 refills | Status: DC

## 2019-04-04 MED ORDER — ASPIRIN EC 81 MG PO TBEC: 81.0000 mg | DELAYED_RELEASE_TABLET | Freq: Every day | ORAL | Status: DC

## 2019-04-24 ENCOUNTER — Encounter: Payer: Self-pay | Admitting: Internal Medicine

## 2019-04-24 ENCOUNTER — Other Ambulatory Visit: Payer: Self-pay

## 2019-04-24 ENCOUNTER — Ambulatory Visit (INDEPENDENT_AMBULATORY_CARE_PROVIDER_SITE_OTHER): Payer: Medicare Other | Admitting: Internal Medicine

## 2019-04-24 VITALS — BP 140/64 | HR 62 | Temp 96.8°F | Ht 63.0 in | Wt 212.4 lb

## 2019-04-24 DIAGNOSIS — Z789 Other specified health status: Secondary | ICD-10-CM | POA: Diagnosis not present

## 2019-04-24 DIAGNOSIS — I10 Essential (primary) hypertension: Secondary | ICD-10-CM | POA: Diagnosis not present

## 2019-04-24 DIAGNOSIS — E782 Mixed hyperlipidemia: Secondary | ICD-10-CM

## 2019-04-24 DIAGNOSIS — I2510 Atherosclerotic heart disease of native coronary artery without angina pectoris: Secondary | ICD-10-CM | POA: Diagnosis not present

## 2019-04-24 DIAGNOSIS — T466X5A Adverse effect of antihyperlipidemic and antiarteriosclerotic drugs, initial encounter: Secondary | ICD-10-CM | POA: Diagnosis not present

## 2019-04-24 DIAGNOSIS — M791 Myalgia, unspecified site: Secondary | ICD-10-CM | POA: Diagnosis not present

## 2019-04-24 LAB — LIPID PANEL
Chol/HDL Ratio: 2.9 ratio (ref 0.0–4.4)
Cholesterol, Total: 137 mg/dL (ref 100–199)
HDL: 48 mg/dL (ref >39)
LDL Chol Calc (NIH): 67 mg/dL (ref 0–99)
Triglycerides: 124 mg/dL (ref 0–149)
VLDL Cholesterol Cal: 22 mg/dL (ref 5–40)

## 2019-04-24 NOTE — Progress Notes (Signed)
LIPID CLINIC CONSULT NOTE  Chief Complaint:  Follow-up dyslipidemia  Primary Care Physician: Mosie Lukes, MD  Primary Cardiologist:  No primary care provider on file.  HPI:  Destiny [name] is a 68 y.o. female who is being seen today for the evaluation of dyslipidemia at the request of Mosie Lukes, MD. This is a pleasant 68 year old female with past medical history significant for moderate obesity, hypertension, dyslipidemia, hypothyroidism, palpitations and prior chest pain which led to heart catheterization in November 2019.  This demonstrated a 50% proximal circumflex lesion which was not significant by FFR, a small 75% ostial diagonal lesion as well as a mid LAD lesion of 25% stenosis.  LVEDP was elevated at 20 mmHg and LVEF was normal.  Based on these findings aggressive medical therapy was recommended.  At that time her chest pain seemed somewhat atypical and according to her still persists.  It feels like a twinge and is present at rest and with exertion.  She was however found to have a markedly abnormal lipid profile.  In addition she is struggled with longstanding hypothyroidism and recently had converted from [place] Thyroid over to Synthroid.  Her dose was increased up to 100 mcg daily by her PCP in July.  Was a month ago which showed total cholesterol 263, HDL 39, triglycerides 216, and LDL 183.  He has had triglycerides over 400 in the past.  She reports her diet has been generally unhealthy and does eat a lot of fast food, fried foods and saturated fats.  Recently she started the 56day diet which she says is good so far and she feels like she would likely see a change in her lipids.  She also has a history of statin intolerance.  She has been tried on both rosuvastatin, atorvastatin, and ezetimibe, all of which have caused significant worsening of myalgias.  04/24/2019  Ms. Ureste returns today for follow-up of dyslipidemia.  She successfully been using Repatha which is both  been affordable and easy to use.  She has no significant side effects.  She has discontinued her red yeast rice.  Unfortunately she has not had repeat lipid testing.  We will plan to do that today since she is fasting.  She denies any side effects such as myalgia.  PMHx:  Past Medical History:  Diagnosis Date  . Abnormal blood chemistry   . Arthropathy   . Back pain   . Breast cyst   . Cellulitis   . Chest discomfort   . Constipation   . Disturbance of skin sensation   . Edema   . Encounter for screening and preventative care 06/17/2016  . GERD (gastroesophageal reflux disease)   . Hiatal hernia with gastroesophageal reflux 06/17/2016  . High risk medication use   . Hoarseness 06/15/2016  . Hyperglycemia   . Hyperglycemia 12/21/2016  . Hyperlipidemia   . Hyperlipidemia, mixed 06/15/2016  . Hypertension   . Hypopotassemia   . Hypothyroidism   . Joint pain   . Menopause   . Obesity   . Palpitations   . Positive ANA (antinuclear antibody)   . Shoulder pain, right   . Sleep apnea   . SOB (shortness of breath)   . Thyroid disease 12/11/2014  . Thyroid nodule   . Urinary incontinence 04/01/2017  . Vitamin D deficiency   . Vitamin D deficiency     Past Surgical History:  Procedure Laterality Date  . BREAST CYST ASPIRATION Left    30 years  ago  1970's or 1980's  . INTRAVASCULAR PRESSURE WIRE/FFR STUDY N/A 04/25/2018   Procedure: INTRAVASCULAR PRESSURE WIRE/FFR STUDY;  Surgeon: Jettie Booze, MD;  Location: [facility];  Service: Cardiovascular;  Laterality: N/A;  . LEFT HEART CATH AND CORONARY ANGIOGRAPHY N/A 04/25/2018   Procedure: LEFT HEART CATH AND CORONARY ANGIOGRAPHY;  Surgeon: Jettie Booze, MD;  Location: [facility];  Service: Cardiovascular;  Laterality: N/A;  . LIPOSUCTION  1990   over gluteal area   . WISDOM TOOTH EXTRACTION     and all upper teeth    FAMHx:  Family History  Problem Relation Age of Onset  . Irregular heart beat Mother    . Heart disease Mother   . Rheumatic fever Mother   . Hypertension Father   . Ulcers Father   . Hyperlipidemia Father   . Vision loss Brother   . Stroke Maternal Grandmother   . Diabetes Maternal Grandmother   . Hypertension Paternal Grandmother   . Ulcers Paternal Grandfather     SOCHx:   reports that she quit smoking about 9 years ago. Her smoking use included cigarettes. She has a 27.00 pack-year smoking history. She has never used smokeless tobacco. She reports current alcohol use. She reports that she does not use drugs.  ALLERGIES:  Allergies  Allergen Reactions  . Crestor [Rosuvastatin Calcium] Other (See Comments)    myalgias  . Diclofenac Itching, Nausea Only and Other (See Comments)    Wheezing  . Hydrochlorothiazide     Wheezing. Losing teeth.  . Statins     ROS: Pertinent items noted in HPI and remainder of comprehensive ROS otherwise negative.  HOME MEDS: Current Outpatient Medications on File Prior to Visit  Medication Sig Dispense Refill  . albuterol (VENTOLIN HFA) 108 (90 Base) MCG/ACT inhaler Inhale 2 puffs into the lungs every 6 (six) hours as needed for wheezing or shortness of breath. 18 g 5  . aspirin EC 81 MG tablet Take 1 tablet (81 mg total) by mouth daily.    . COD LIVER OIL PO Take 1 capsule by mouth daily. Reported on 07/16/2015    . Cyanocobalamin (VITAMIN B-12) 500 MCG LOZG Take 500 mcg by mouth daily.     . Evolocumab (REPATHA SURECLICK) XX123456 MG/ML SOAJ Inject 1 Dose into the skin every 14 (fourteen) days. 6 pen 3  . irbesartan (AVAPRO) 150 MG tablet TAKE ONE TABLET BY MOUTH EVERY DAY 90 tablet 1  . levothyroxine (SYNTHROID) 112 MCG tablet Take 1 tablet (112 mcg total) by mouth daily. 90 tablet 3  . metoprolol succinate (TOPROL-XL) 25 MG 24 hr tablet TAKE ONE TABLET BY MOUTH EVERY DAY 90 tablet 1  . nitroGLYCERIN (NITROSTAT) 0.4 MG SL tablet Place 1 tablet (0.4 mg total) under the tongue every 5 (five) minutes as needed for chest pain. 25 tablet 3   . Probiotic Product (PROBIOTIC-10) CAPS Take 1 capsule by mouth daily.     . RED YEAST RICE EXTRACT PO Take 1 capsule by mouth daily. Reported on 07/16/2015    . Vitamin D, Ergocalciferol, (DRISDOL) 1.25 MG (50000 UT) CAPS capsule TAKE 1 CAPSULE BY MOUTH EVERY 7 DAYS 12 capsule 4  . Wheat Dextrin (BENEFIBER DRINK MIX PO) Take 1 capsule by mouth daily.      No current facility-administered medications on file prior to visit.     LABS/IMAGING: No results found for this or any previous visit (from the past 48 hour(s)). No results found.  LIPID  PANEL:    Component Value Date/Time   CHOL 218 (H) 02/01/2019 1204   TRIG 158 (H) 02/01/2019 1204   HDL 41 02/01/2019 1204   CHOLHDL 5.3 (H) 02/01/2019 1204   CHOLHDL 6.7 (H) 12/08/2018 1531   VLDL 41.6 (H) 08/11/2018 1501   LDLCALC 145 (H) 02/01/2019 1204   LDLCALC 183 (H) 12/08/2018 1531   LDLDIRECT 205.0 08/11/2018 1501    WEIGHTS: Wt Readings from Last 3 Encounters:  04/24/19 212 lb 6.4 oz (96.3 kg)  03/13/19 217 lb 12.8 oz (98.8 kg)  02/01/19 219 lb 6.4 oz (99.5 kg)    VITALS: BP 140/64   Pulse 62   Temp (!) 96.8 F (36 C)   Ht 5\' 3"  (1.6 m)   Wt 212 lb 6.4 oz (96.3 kg)   SpO2 100%   BMI 37.62 kg/m   EXAM: Deferred  EKG: Deferred  ASSESSMENT: 1. Mixed dyslipidemia with elevated LDL and triglycerides 2. Coronary artery disease with moderate to severe disease by cath (04/2018)-managed medically 3. Morbid obesity 4. Hypertension 5. Palpitations 6. Hypothyroidism  PLAN: 1.   Ms. Zillmer has been tolerating Repatha well without any significant side effects.  She discontinued her red yeast rice.  We will plan to check a lipid profile today as she has been consistently on therapy now for over 2 months.  Follow-up in 6 months or sooner as necessary.  Pixie Casino, MD, [facility], [place] Director of the Advanced Lipid Disorders &  Cardiovascular Risk Reduction Clinic Diplomate of the  American Board of Clinical Lipidology Attending Cardiologist  Direct Dial: [phone]  Fax: [phone]  Website:  [email or web address]  04/24/2019, 11:21 AM

## 2019-04-24 NOTE — Patient Instructions (Signed)
Medication Instructions:  Your physician recommends that you continue on your current medications as directed. Please refer to the Current Medication list given to you today.  *If you need a refill on your cardiac medications before your next appointment, please call your pharmacy*  Lab Work: FASTING lab work today If you have labs (blood work) drawn today and your tests are completely normal, you will receive your results only by: Marland Kitchen MyChart Message (if you have MyChart) OR . A paper copy in the mail If you have any lab test that is abnormal or we need to change your treatment, we will call you to review the results.  Testing/Procedures: NONE  Follow-Up: At [facility], you and your health needs are our priority.  As part of our continuing mission to provide you with exceptional heart care, we have created designated Provider Care Teams.  These Care Teams include your primary Cardiologist (physician) and Advanced Practice Providers (APPs -  Physician Assistants and Nurse Practitioners) who all work together to provide you with the care you need, when you need it.  Your next appointment:   6 months - LIPID CLINIC  The format for your next appointment:   In Person  Provider:   K. Mali Hilty, MD  Other Instructions

## 2019-05-22 ENCOUNTER — Ambulatory Visit: Payer: Medicare Other | Admitting: Internal Medicine

## 2019-06-15 ENCOUNTER — Encounter: Payer: Self-pay | Admitting: Family Medicine

## 2019-06-15 ENCOUNTER — Other Ambulatory Visit: Payer: Self-pay

## 2019-06-15 ENCOUNTER — Ambulatory Visit (INDEPENDENT_AMBULATORY_CARE_PROVIDER_SITE_OTHER): Payer: Medicare Other | Admitting: Family Medicine

## 2019-06-15 VITALS — BP 120/62 | HR 68 | Temp 97.8°F | Resp 18 | Wt 215.0 lb

## 2019-06-15 DIAGNOSIS — I10 Essential (primary) hypertension: Secondary | ICD-10-CM | POA: Diagnosis not present

## 2019-06-15 DIAGNOSIS — R0902 Hypoxemia: Secondary | ICD-10-CM

## 2019-06-15 DIAGNOSIS — E039 Hypothyroidism, unspecified: Secondary | ICD-10-CM

## 2019-06-15 DIAGNOSIS — E559 Vitamin D deficiency, unspecified: Secondary | ICD-10-CM

## 2019-06-15 DIAGNOSIS — E782 Mixed hyperlipidemia: Secondary | ICD-10-CM

## 2019-06-15 DIAGNOSIS — E538 Deficiency of other specified B group vitamins: Secondary | ICD-10-CM

## 2019-06-15 DIAGNOSIS — E8881 Metabolic syndrome: Secondary | ICD-10-CM

## 2019-06-15 LAB — T4, FREE: Free T4: 0.9 ng/dL (ref 0.60–1.60)

## 2019-06-15 LAB — TSH: TSH: 19.9 u[IU]/mL — ABNORMAL HIGH (ref 0.35–4.50)

## 2019-06-15 MED ORDER — METOPROLOL SUCCINATE ER 25 MG PO TB24: 25.0000 mg | ORAL_TABLET | Freq: Every day | ORAL | 1 refills | Status: DC

## 2019-06-15 MED ORDER — PULSE OXIMETER FOR FINGER MISC: 1.0000 | Freq: Three times a day (TID) | 0 refills | Status: DC | PRN

## 2019-06-15 MED ORDER — ASPIRIN EC 81 MG PO TBEC: 81.0000 mg | DELAYED_RELEASE_TABLET | Freq: Every day | ORAL | 3 refills | Status: DC

## 2019-06-15 MED ORDER — IRBESARTAN 150 MG PO TABS: 150.0000 mg | ORAL_TABLET | Freq: Every day | ORAL | 1 refills | Status: DC

## 2019-06-15 NOTE — Assessment & Plan Note (Signed)
Supplement and monitor 
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
## 2019-06-15 NOTE — Assessment & Plan Note (Signed)
Encouraged DASH diet, decrease po intake and increase exercise as tolerated. Needs 7-8 hours of sleep nightly. Avoid trans fats, eat small, frequent meals every 4-5 hours with lean proteins, complex carbs and healthy fats. Minimize simple carbs, consider [employer] APP 

## 2019-06-15 NOTE — Patient Instructions (Addendum)
Pulse oximeter want numbers in the 90s Hypothyroidism  Hypothyroidism is when the thyroid gland does not make enough of certain hormones (it is underactive). The thyroid gland is a small gland located in the lower front part of the neck, just in front of the windpipe (trachea). This gland makes hormones that help control how the body uses food for energy (metabolism) as well as how the heart and brain function. These hormones also play a role in keeping your bones strong. When the thyroid is underactive, it produces too little of the hormones thyroxine (T4) and triiodothyronine (T3). What are the causes? This condition may be caused by:  Hashimoto's disease. This is a disease in which the body's disease-fighting system (immune system) attacks the thyroid gland. This is the most common cause.  Viral infections.  Pregnancy.  Certain medicines.  Birth defects.  Past radiation treatments to the head or neck for cancer.  Past treatment with radioactive iodine.  Past exposure to radiation in the environment.  Past surgical removal of part or all of the thyroid.  Problems with a gland in the center of the brain (pituitary gland).  Lack of enough iodine in the diet. What increases the risk? You are more likely to develop this condition if:  You are female.  You have a family history of thyroid conditions.  You use a medicine called lithium.  You take medicines that affect the immune system (immunosuppressants). What are the signs or symptoms? Symptoms of this condition include:  Feeling as though you have no energy (lethargy).  Not being able to tolerate cold.  Weight gain that is not explained by a change in diet or exercise habits.  Lack of appetite.  Dry skin.  Coarse hair.  Menstrual irregularity.  Slowing of thought processes.  Constipation.  Sadness or depression. How is this diagnosed? This condition may be diagnosed based on:  Your symptoms, your medical  history, and a physical exam.  Blood tests. You may also have imaging tests, such as an ultrasound or MRI. How is this treated? This condition is treated with medicine that replaces the thyroid hormones that your body does not make. After you begin treatment, it may take several weeks for symptoms to go away. Follow these instructions at home:  Take over-the-counter and prescription medicines only as told by your health care provider.  If you start taking any new medicines, tell your health care provider.  Keep all follow-up visits as told by your health care provider. This is important. ? As your condition improves, your dosage of thyroid hormone medicine may change. ? You will need to have blood tests regularly so that your health care provider can monitor your condition. Contact a health care provider if:  Your symptoms do not get better with treatment.  You are taking thyroid replacement medicine and you: ? Sweat a lot. ? Have tremors. ? Feel anxious. ? Lose weight rapidly. ? Cannot tolerate heat. ? Have emotional swings. ? Have diarrhea. ? Feel weak. Get help right away if you have:  Chest pain.  An irregular heartbeat.  A rapid heartbeat.  Difficulty breathing. Summary  Hypothyroidism is when the thyroid gland does not make enough of certain hormones (it is underactive).  When the thyroid is underactive, it produces too little of the hormones thyroxine (T4) and triiodothyronine (T3).  The most common cause is Hashimoto's disease, a disease in which the body's disease-fighting system (immune system) attacks the thyroid gland. The condition can also be caused  by viral infections, medicine, pregnancy, or past radiation treatment to the head or neck.  Symptoms may include weight gain, dry skin, constipation, feeling as though you do not have energy, and not being able to tolerate cold.  This condition is treated with medicine to replace the thyroid hormones that your  body does not make. This information is not intended to replace advice given to you by your health care provider. Make sure you discuss any questions you have with your health care provider. Document Revised: 05/07/2017 Document Reviewed: 05/05/2017 Elsevier Patient Education  2020 [employer].

## 2019-06-15 NOTE — Assessment & Plan Note (Addendum)
Well controlled, no changes to meds. Encouraged heart healthy diet such as the DASH diet and exercise as tolerated.  °

## 2019-06-15 NOTE — Assessment & Plan Note (Signed)
Encouraged heart healthy diet, increase exercise, avoid trans fats, consider a krill oil cap daily 

## 2019-06-15 NOTE — Assessment & Plan Note (Signed)
hgba1c acceptable, minimize simple carbs. Increase exercise as tolerated.  

## 2019-06-15 NOTE — Assessment & Plan Note (Signed)
On Levothyroxine, continue to monitor 

## 2019-06-16 MED ORDER — LEVOTHYROXINE SODIUM 125 MCG PO TABS: 125.0000 ug | ORAL_TABLET | Freq: Every day | ORAL | 3 refills | Status: DC

## 2019-06-16 NOTE — Addendum Note (Signed)
Addended by: Magdalene Molly A on: 06/16/2019 04:45 PM   Modules accepted: Orders

## 2019-07-17 DIAGNOSIS — L281 Prurigo nodularis: Secondary | ICD-10-CM | POA: Diagnosis not present

## 2019-08-03 DIAGNOSIS — H18822 Corneal disorder due to contact lens, left eye: Secondary | ICD-10-CM | POA: Diagnosis not present

## 2019-08-24 ENCOUNTER — Other Ambulatory Visit: Payer: Self-pay | Admitting: Internal Medicine

## 2019-08-24 DIAGNOSIS — T466X5A Adverse effect of antihyperlipidemic and antiarteriosclerotic drugs, initial encounter: Secondary | ICD-10-CM

## 2019-08-24 DIAGNOSIS — E782 Mixed hyperlipidemia: Secondary | ICD-10-CM

## 2019-08-24 DIAGNOSIS — I2510 Atherosclerotic heart disease of native coronary artery without angina pectoris: Secondary | ICD-10-CM

## 2019-08-24 DIAGNOSIS — M791 Myalgia, unspecified site: Secondary | ICD-10-CM

## 2019-10-05 ENCOUNTER — Ambulatory Visit (INDEPENDENT_AMBULATORY_CARE_PROVIDER_SITE_OTHER): Payer: Medicare Other | Admitting: Family Medicine

## 2019-10-05 ENCOUNTER — Encounter: Payer: Self-pay | Admitting: Family Medicine

## 2019-10-05 ENCOUNTER — Other Ambulatory Visit: Payer: Self-pay

## 2019-10-05 DIAGNOSIS — R739 Hyperglycemia, unspecified: Secondary | ICD-10-CM

## 2019-10-05 DIAGNOSIS — E039 Hypothyroidism, unspecified: Secondary | ICD-10-CM | POA: Diagnosis not present

## 2019-10-05 DIAGNOSIS — E538 Deficiency of other specified B group vitamins: Secondary | ICD-10-CM | POA: Diagnosis not present

## 2019-10-05 DIAGNOSIS — E559 Vitamin D deficiency, unspecified: Secondary | ICD-10-CM

## 2019-10-05 DIAGNOSIS — E782 Mixed hyperlipidemia: Secondary | ICD-10-CM | POA: Diagnosis not present

## 2019-10-05 DIAGNOSIS — E8881 Metabolic syndrome: Secondary | ICD-10-CM

## 2019-10-05 DIAGNOSIS — I2510 Atherosclerotic heart disease of native coronary artery without angina pectoris: Secondary | ICD-10-CM | POA: Diagnosis not present

## 2019-10-05 DIAGNOSIS — I10 Essential (primary) hypertension: Secondary | ICD-10-CM

## 2019-10-05 NOTE — Assessment & Plan Note (Signed)
hgba1c acceptable, minimize simple carbs. Increase exercise as tolerated.  

## 2019-10-05 NOTE — Patient Instructions (Signed)

## 2019-10-05 NOTE — Assessment & Plan Note (Signed)
Well controlled, no changes to meds. Encouraged heart healthy diet such as the DASH diet and exercise as tolerated.  °

## 2019-10-05 NOTE — Assessment & Plan Note (Signed)
hgba1c acceptable, minimize simple carbs. Increase exercise as tolerated. Continue current meds 

## 2019-10-05 NOTE — Assessment & Plan Note (Signed)
Encouraged heart healthy diet, increase exercise, avoid trans fats, consider a krill oil cap daily 

## 2019-10-05 NOTE — Assessment & Plan Note (Signed)
Supplement and monitor 

## 2019-10-05 NOTE — Assessment & Plan Note (Signed)
On Levothyroxine, continue to monitor 

## 2019-10-06 ENCOUNTER — Other Ambulatory Visit (INDEPENDENT_AMBULATORY_CARE_PROVIDER_SITE_OTHER): Payer: Medicare Other

## 2019-10-06 DIAGNOSIS — E039 Hypothyroidism, unspecified: Secondary | ICD-10-CM | POA: Diagnosis not present

## 2019-10-06 DIAGNOSIS — E538 Deficiency of other specified B group vitamins: Secondary | ICD-10-CM

## 2019-10-06 DIAGNOSIS — E559 Vitamin D deficiency, unspecified: Secondary | ICD-10-CM

## 2019-10-06 DIAGNOSIS — E782 Mixed hyperlipidemia: Secondary | ICD-10-CM | POA: Diagnosis not present

## 2019-10-06 DIAGNOSIS — I10 Essential (primary) hypertension: Secondary | ICD-10-CM | POA: Diagnosis not present

## 2019-10-06 DIAGNOSIS — E88819 Insulin resistance, unspecified: Secondary | ICD-10-CM

## 2019-10-06 DIAGNOSIS — R739 Hyperglycemia, unspecified: Secondary | ICD-10-CM

## 2019-10-06 DIAGNOSIS — E8881 Metabolic syndrome: Secondary | ICD-10-CM | POA: Diagnosis not present

## 2019-10-06 LAB — COMPREHENSIVE METABOLIC PANEL
ALT: 21 U/L (ref 0–35)
AST: 16 U/L (ref 0–37)
Albumin: 4.6 g/dL (ref 3.5–5.2)
Alkaline Phosphatase: 53 U/L (ref 39–117)
BUN: 17 mg/dL (ref 6–23)
CO2: 31 mEq/L (ref 19–32)
Calcium: 9.6 mg/dL (ref 8.4–10.5)
Chloride: 100 mEq/L (ref 96–112)
Creatinine, Ser: 0.81 mg/dL (ref 0.40–1.20)
GFR: 70.52 mL/min (ref >60.00)
Glucose, Bld: 106 mg/dL — ABNORMAL HIGH (ref 70–99)
Potassium: 4.4 mEq/L (ref 3.5–5.1)
Sodium: 137 mEq/L (ref 135–145)
Total Bilirubin: 0.7 mg/dL (ref 0.2–1.2)
Total Protein: 6.9 g/dL (ref 6.0–8.3)

## 2019-10-06 LAB — TSH: TSH: 6.18 u[IU]/mL — ABNORMAL HIGH (ref 0.35–4.50)

## 2019-10-06 LAB — T4, FREE: Free T4: 1.11 ng/dL (ref 0.60–1.60)

## 2019-10-06 LAB — HEMOGLOBIN A1C: Hgb A1c MFr Bld: 5.5 % (ref 4.6–6.5)

## 2019-10-06 LAB — LIPID PANEL
Cholesterol: 128 mg/dL (ref 0–200)
HDL: 44.7 mg/dL (ref >39.00)
LDL Cholesterol: 51 mg/dL (ref 0–99)
NonHDL: 82.93
Total CHOL/HDL Ratio: 3
Triglycerides: 158 mg/dL — ABNORMAL HIGH (ref 0.0–149.0)
VLDL: 31.6 mg/dL (ref 0.0–40.0)

## 2019-10-06 LAB — CBC
HCT: 41.8 % (ref 36.0–46.0)
Hemoglobin: 13.9 g/dL (ref 12.0–15.0)
MCHC: 33.2 g/dL (ref 30.0–36.0)
MCV: 91.3 fl (ref 78.0–100.0)
Platelets: 261 10*3/uL (ref 150.0–400.0)
RBC: 4.57 Mil/uL (ref 3.87–5.11)
RDW: 13.3 % (ref 11.5–15.5)
WBC: 4.3 10*3/uL (ref 4.0–10.5)

## 2019-10-06 LAB — VITAMIN B12: Vitamin B-12: 840 pg/mL (ref 211–911)

## 2019-10-06 LAB — VITAMIN D 25 HYDROXY (VIT D DEFICIENCY, FRACTURES): VITD: 39.79 ng/mL (ref 30.00–100.00)

## 2019-10-08 NOTE — Progress Notes (Signed)
Patient ID: Destiny [name], female   DOB: 27-Apr-1951, 69 y.o.   MRN: [id number]   Subjective:    Patient ID: Destiny [name], female    DOB: 02/05/1951, 69 y.o.   MRN: [id number]  Chief Complaint  Patient presents with  . 4 month follow up    HPI Patient is in today for follow up on chronic medical concerns. She feels well today. No recent febrile illness or hospitalizations. No polyuria or polydipsia she is trying to maintain a heart healthy diet. Denies CP/palp/SOB/HA/congestion/fevers/GI or GU c/o. Taking meds as prescribed  Past Medical History:  Diagnosis Date  . Abnormal blood chemistry   . Arthropathy   . Back pain   . Breast cyst   . Cellulitis   . Chest discomfort   . Constipation   . Disturbance of skin sensation   . Edema   . Encounter for screening and preventative care 06/17/2016  . GERD (gastroesophageal reflux disease)   . Hiatal hernia with gastroesophageal reflux 06/17/2016  . High risk medication use   . Hoarseness 06/15/2016  . Hyperglycemia   . Hyperglycemia 12/21/2016  . Hyperlipidemia   . Hyperlipidemia, mixed 06/15/2016  . Hypertension   . Hypopotassemia   . Hypothyroidism   . Joint pain   . Menopause   . Obesity   . Palpitations   . Positive ANA (antinuclear antibody)   . Shoulder pain, right   . Sleep apnea   . SOB (shortness of breath)   . Thyroid disease 12/11/2014  . Thyroid nodule   . Urinary incontinence 04/01/2017  . Vitamin D deficiency   . Vitamin D deficiency     Past Surgical History:  Procedure Laterality Date  . BREAST CYST ASPIRATION Left    30 years ago  1970's or 1980's  . INTRAVASCULAR PRESSURE WIRE/FFR STUDY N/A 04/25/2018   Procedure: INTRAVASCULAR PRESSURE WIRE/FFR STUDY;  Surgeon: Jettie Booze, MD;  Location: [facility];  Service: Cardiovascular;  Laterality: N/A;  . LEFT HEART CATH AND CORONARY ANGIOGRAPHY N/A 04/25/2018   Procedure: LEFT HEART CATH AND CORONARY ANGIOGRAPHY;  Surgeon: Jettie Booze, MD;   Location: [facility];  Service: Cardiovascular;  Laterality: N/A;  . LIPOSUCTION  1990   over gluteal area   . WISDOM TOOTH EXTRACTION     and all upper teeth    Family History  Problem Relation Age of Onset  . Irregular heart beat Mother   . Heart disease Mother   . Rheumatic fever Mother   . Hypertension Father   . Ulcers Father   . Hyperlipidemia Father   . Vision loss Brother   . Stroke Maternal Grandmother   . Diabetes Maternal Grandmother   . Hypertension Paternal Grandmother   . Ulcers Paternal Grandfather     Social History   Socioeconomic History  . Marital status: Married    Spouse name: Yesli Salvato  . Number of children: Not on file  . Years of education: Not on file  . Highest education level: Not on file  Occupational History  . Occupation: homemaker  Tobacco Use  . Smoking status: Former Smoker    Packs/day: 1.00    Years: 27.00    Pack years: 27.00    Types: Cigarettes    Quit date: 2011    Years since quitting: 10.3  . Smokeless tobacco: Never Used  Substance and Sexual Activity  . Alcohol use: Yes    Alcohol/week: 0.0 standard drinks  . Drug  use: No  . Sexual activity: Not Currently    Birth control/protection: Post-menopausal    Comment: menopause around age [age]  Other Topics Concern  . Not on file  Social History Narrative   Occasional alcohol use: hard liquor, wine and beer   Social Determinants of Health   Financial Resource Strain:   . Difficulty of Paying Living Expenses:   Food Insecurity:   . Worried About Charity fundraiser in the Last Year:   . Arboriculturist in the Last Year:   Transportation Needs:   . Film/video editor (Medical):   Marland Kitchen Lack of Transportation (Non-Medical):   Physical Activity:   . Days of Exercise per Week:   . Minutes of Exercise per Session:   Stress:   . Feeling of Stress :   Social Connections:   . Frequency of Communication with Friends and Family:   . Frequency of Social  Gatherings with Friends and Family:   . Attends Religious Services:   . Active Member of Clubs or Organizations:   . Attends Archivist Meetings:   Marland Kitchen Marital Status:   Intimate Partner Violence:   . Fear of Current or Ex-Partner:   . Emotionally Abused:   Marland Kitchen Physically Abused:   . Sexually Abused:     Outpatient Medications Prior to Visit  Medication Sig Dispense Refill  . albuterol (VENTOLIN HFA) 108 (90 Base) MCG/ACT inhaler Inhale 2 puffs into the lungs every 6 (six) hours as needed for wheezing or shortness of breath. 18 g 5  . aspirin EC 81 MG tablet Take 1 tablet (81 mg total) by mouth daily. 90 tablet 3  . COD LIVER OIL PO Take 1 capsule by mouth daily. Reported on 07/16/2015    . Cyanocobalamin (VITAMIN B-12) 500 MCG LOZG Take 500 mcg by mouth daily.     . Evolocumab (REPATHA SURECLICK) XX123456 MG/ML SOAJ Inject 1 Dose into the skin every 14 (fourteen) days. 6 pen 3  . irbesartan (AVAPRO) 150 MG tablet Take 1 tablet (150 mg total) by mouth daily. 90 tablet 1  . levothyroxine (SYNTHROID) 125 MCG tablet Take 1 tablet (125 mcg total) by mouth daily. 90 tablet 3  . metoprolol succinate (TOPROL-XL) 25 MG 24 hr tablet Take 1 tablet (25 mg total) by mouth daily. 90 tablet 1  . Misc. Devices (PULSE OXIMETER FOR FINGER) MISC 1 Dose by Does not apply route 3 (three) times daily as needed. 1 each 0  . nitroGLYCERIN (NITROSTAT) 0.4 MG SL tablet Place 1 tablet (0.4 mg total) under the tongue every 5 (five) minutes as needed for chest pain. 25 tablet 3  . Probiotic Product (PROBIOTIC-10) CAPS Take 1 capsule by mouth daily.     . RED YEAST RICE EXTRACT PO Take 1 capsule by mouth daily. Reported on 07/16/2015    . Vitamin D, Ergocalciferol, (DRISDOL) 1.25 MG (50000 UT) CAPS capsule TAKE 1 CAPSULE BY MOUTH EVERY 7 DAYS 12 capsule 4  . Wheat Dextrin (BENEFIBER DRINK MIX PO) Take 1 capsule by mouth daily.      No facility-administered medications prior to visit.    Allergies  Allergen  Reactions  . Crestor [Rosuvastatin Calcium] Other (See Comments)    myalgias  . Diclofenac Itching, Nausea Only and Other (See Comments)    Wheezing  . Hydrochlorothiazide     Wheezing. Losing teeth.  . Statins     Review of Systems  Constitutional: Negative for fever and malaise/fatigue.  HENT: Negative  for congestion.   Eyes: Negative for blurred vision.  Respiratory: Negative for shortness of breath.   Cardiovascular: Negative for chest pain, palpitations and leg swelling.  Gastrointestinal: Negative for abdominal pain, blood in stool and nausea.  Genitourinary: Negative for dysuria and frequency.  Musculoskeletal: Negative for falls.  Skin: Negative for rash.  Neurological: Negative for dizziness, loss of consciousness and headaches.  Endo/Heme/Allergies: Negative for environmental allergies.  Psychiatric/Behavioral: Negative for depression. The patient is not nervous/anxious.        Objective:    Physical Exam Vitals and nursing note reviewed.  Constitutional:      General: She is not in acute distress.    Appearance: She is well-developed.  HENT:     Head: Normocephalic and atraumatic.     Nose: Nose normal.  Eyes:     General:        Right eye: No discharge.        Left eye: No discharge.  Cardiovascular:     Rate and Rhythm: Normal rate and regular rhythm.     Heart sounds: No murmur.  Pulmonary:     Effort: Pulmonary effort is normal.     Breath sounds: Normal breath sounds.  Abdominal:     General: Bowel sounds are normal.     Palpations: Abdomen is soft.     Tenderness: There is no abdominal tenderness.  Musculoskeletal:     Cervical back: Normal range of motion and neck supple.  Skin:    General: Skin is warm and dry.  Neurological:     Mental Status: She is alert and oriented to person, place, and time.     BP 128/70 (BP Location: Left Arm, Cuff Size: Large)   Pulse 65   Temp 98.2 F (36.8 C) (Temporal)   Resp 12   Ht 5\' 3"  (1.6 m)   Wt  214 lb 12.8 oz (97.4 kg)   SpO2 96%   BMI 38.05 kg/m  Wt Readings from Last 3 Encounters:  10/05/19 214 lb 12.8 oz (97.4 kg)  06/15/19 215 lb (97.5 kg)  04/24/19 212 lb 6.4 oz (96.3 kg)    Diabetic Foot Exam - Simple   No data filed     Lab Results  Component Value Date   WBC 4.3 10/06/2019   HGB 13.9 10/06/2019   HCT 41.8 10/06/2019   PLT 261.0 10/06/2019   GLUCOSE 106 (H) 10/06/2019   CHOL 128 10/06/2019   TRIG 158.0 (H) 10/06/2019   HDL 44.70 10/06/2019   LDLDIRECT 205.0 08/11/2018   LDLCALC 51 10/06/2019   ALT 21 10/06/2019   AST 16 10/06/2019   NA 137 10/06/2019   K 4.4 10/06/2019   CL 100 10/06/2019   CREATININE 0.81 10/06/2019   BUN 17 10/06/2019   CO2 31 10/06/2019   TSH 6.18 (H) 10/06/2019   HGBA1C 5.5 10/06/2019    Lab Results  Component Value Date   TSH 6.18 (H) 10/06/2019   Lab Results  Component Value Date   WBC 4.3 10/06/2019   HGB 13.9 10/06/2019   HCT 41.8 10/06/2019   MCV 91.3 10/06/2019   PLT 261.0 10/06/2019   Lab Results  Component Value Date   NA 137 10/06/2019   K 4.4 10/06/2019   CO2 31 10/06/2019   GLUCOSE 106 (H) 10/06/2019   BUN 17 10/06/2019   CREATININE 0.81 10/06/2019   BILITOT 0.7 10/06/2019   ALKPHOS 53 10/06/2019   AST 16 10/06/2019   ALT 21 10/06/2019  PROT 6.9 10/06/2019   ALBUMIN 4.6 10/06/2019   CALCIUM 9.6 10/06/2019   GFR 70.52 10/06/2019   Lab Results  Component Value Date   CHOL 128 10/06/2019   Lab Results  Component Value Date   HDL 44.70 10/06/2019   Lab Results  Component Value Date   LDLCALC 51 10/06/2019   Lab Results  Component Value Date   TRIG 158.0 (H) 10/06/2019   Lab Results  Component Value Date   CHOLHDL 3 10/06/2019   Lab Results  Component Value Date   HGBA1C 5.5 10/06/2019       Assessment & Plan:   Problem List Items Addressed This Visit    Hypothyroidism (Chronic)    On Levothyroxine, continue to monitor      Relevant Orders   TSH (Completed)   T4, free  (Completed)   Essential hypertension    Well controlled, no changes to meds. Encouraged heart healthy diet such as the DASH diet and exercise as tolerated.       Relevant Orders   CBC (Completed)   Comprehensive metabolic panel (Completed)   TSH (Completed)   Hyperglycemia    hgba1c acceptable, minimize simple carbs. Increase exercise as tolerated. Continue current meds      Relevant Orders   Hemoglobin A1c (Completed)   Mixed hyperlipidemia    Encouraged heart healthy diet, increase exercise, avoid trans fats, consider a krill oil cap daily      Relevant Orders   Lipid panel (Completed)   Vitamin D deficiency    Supplement and monitor      Relevant Orders   VITAMIN D 25 Hydroxy (Vit-D Deficiency, Fractures) (Completed)   RESOLVED: Insulin resistance    hgba1c acceptable, minimize simple carbs. Increase exercise as tolerated.       Relevant Orders   Hemoglobin A1c (Completed)   Vitamin B12 deficiency    Supplement and monitor      Relevant Orders   Vitamin B12 (Completed)      I am having Valia A. Quesada maintain her COD LIVER OIL PO, RED YEAST RICE EXTRACT PO, Vitamin B-12, Probiotic-10, Wheat Dextrin (BENEFIBER DRINK MIX PO), albuterol, Repatha SureClick, Vitamin D (Ergocalciferol), nitroGLYCERIN, aspirin EC, Pulse Oximeter For Finger, irbesartan, metoprolol succinate, and levothyroxine.  No orders of the defined types were placed in this encounter.    Penni Homans, MD

## 2019-10-10 ENCOUNTER — Other Ambulatory Visit: Payer: Self-pay | Admitting: *Deleted

## 2019-10-10 DIAGNOSIS — E039 Hypothyroidism, unspecified: Secondary | ICD-10-CM

## 2019-10-10 MED ORDER — LEVOTHYROXINE SODIUM 137 MCG PO TABS: 137.0000 ug | ORAL_TABLET | Freq: Every day | ORAL | 0 refills | Status: DC

## 2019-10-11 ENCOUNTER — Encounter: Payer: Self-pay | Admitting: Family Medicine

## 2019-10-16 DIAGNOSIS — L03213 Periorbital cellulitis: Secondary | ICD-10-CM | POA: Diagnosis not present

## 2019-10-23 ENCOUNTER — Ambulatory Visit (INDEPENDENT_AMBULATORY_CARE_PROVIDER_SITE_OTHER): Payer: Medicare Other | Admitting: Internal Medicine

## 2019-10-23 ENCOUNTER — Encounter: Payer: Self-pay | Admitting: Internal Medicine

## 2019-10-23 ENCOUNTER — Other Ambulatory Visit: Payer: Self-pay

## 2019-10-23 VITALS — BP 132/76 | HR 62 | Ht 63.0 in | Wt 214.8 lb

## 2019-10-23 DIAGNOSIS — I209 Angina pectoris, unspecified: Secondary | ICD-10-CM

## 2019-10-23 DIAGNOSIS — E782 Mixed hyperlipidemia: Secondary | ICD-10-CM | POA: Diagnosis not present

## 2019-10-23 DIAGNOSIS — I2510 Atherosclerotic heart disease of native coronary artery without angina pectoris: Secondary | ICD-10-CM

## 2019-10-23 DIAGNOSIS — M791 Myalgia, unspecified site: Secondary | ICD-10-CM

## 2019-10-23 DIAGNOSIS — T466X5A Adverse effect of antihyperlipidemic and antiarteriosclerotic drugs, initial encounter: Secondary | ICD-10-CM | POA: Diagnosis not present

## 2019-10-23 MED ORDER — NITROGLYCERIN 0.4 MG SL SUBL: 0.4000 mg | SUBLINGUAL_TABLET | SUBLINGUAL | 3 refills | Status: DC | PRN

## 2019-10-23 NOTE — Patient Instructions (Addendum)
Medication Instructions:  Your physician recommends that you continue on your current medications as directed. Please refer to the Current Medication list given to you today.  *If you need a refill on your cardiac medications before your next appointment, please call your pharmacy*   Lab Work: FASTING lab work in 12 months to check cholesterol   If you have labs (blood work) drawn today and your tests are completely normal, you will receive your results only by: Marland Kitchen MyChart Message (if you have MyChart) OR . A paper copy in the mail If you have any lab test that is abnormal or we need to change your treatment, we will call you to review the results.   Testing/Procedures: NONE   Follow-Up: At [facility], you and your health needs are our priority.  As part of our continuing mission to provide you with exceptional heart care, we have created designated Provider Care Teams.  These Care Teams include your primary Cardiologist (physician) and Advanced Practice Providers (APPs -  Physician Assistants and Nurse Practitioners) who all work together to provide you with the care you need, when you need it.  We recommend signing up for the patient portal called "MyChart".  Sign up information is provided on this After Visit Summary.  MyChart is used to connect with patients for Virtual Visits (Telemedicine).  Patients are able to view lab/test results, encounter notes, upcoming appointments, etc.  Non-urgent messages can be sent to your provider as well.   To learn more about what you can do with MyChart, go to [email or web address].    Your next appointment:   12 month(s) - lipid clinic  The format for your next appointment:   In Person  Provider:   K. Mali Hilty, MD   Other Instructions  Your last appointment with Dr. Geraldo Pitter was 05/2018

## 2019-10-23 NOTE — Progress Notes (Signed)
LIPID CLINIC CONSULT NOTE  Chief Complaint:  Follow-up dyslipidemia  Primary Care Physician: Mosie Lukes, MD  Primary Cardiologist:  Jenean Lindau, MD  HPI:  Destiny [name] is a 69 y.o. female who is being seen today for the evaluation of dyslipidemia at the request of Mosie Lukes, MD. This is a pleasant 69 year old female with past medical history significant for moderate obesity, hypertension, dyslipidemia, hypothyroidism, palpitations and prior chest pain which led to heart catheterization in November 2019.  This demonstrated a 50% proximal circumflex lesion which was not significant by FFR, a small 75% ostial diagonal lesion as well as a mid LAD lesion of 25% stenosis.  LVEDP was elevated at 20 mmHg and LVEF was normal.  Based on these findings aggressive medical therapy was recommended.  At that time her chest pain seemed somewhat atypical and according to her still persists.  It feels like a twinge and is present at rest and with exertion.  She was however found to have a markedly abnormal lipid profile.  In addition she is struggled with longstanding hypothyroidism and recently had converted from Armour Thyroid over to Synthroid.  Her dose was increased up to 100 mcg daily by her PCP in July.  Was a month ago which showed total cholesterol 263, HDL 39, triglycerides 216, and LDL 183.  He has had triglycerides over 400 in the past.  She reports her diet has been generally unhealthy and does eat a lot of fast food, fried foods and saturated fats.  Recently she started the 56day diet which she says is good so far and she feels like she would likely see a change in her lipids.  She also has a history of statin intolerance.  She has been tried on both rosuvastatin, atorvastatin, and ezetimibe, all of which have caused significant worsening of myalgias.  04/24/2019  Destiny [name] returns today for follow-up of dyslipidemia.  She successfully been using Repatha which is both been  affordable and easy to use.  She has no significant side effects.  She has discontinued her red yeast rice.  Unfortunately she has not had repeat lipid testing.  We will plan to do that today since she is fasting.  She denies any side effects such as myalgia.  10/23/2019  Destiny [name] is seen today for follow-up.  She is done well on Repatha.  Total cholesterol now 128, triglycerides 158, HDL 44 and LDL 51.  She reports some occasional chest discomfort, not associated with exertion always or relieved by rest.  It is not limited her activities.  She has not required any nitro.  She has not had a new prescription in a while.  Her cardiologist Dr. Geraldo Pitter saw her last in 2019.  I would advise follow-up there.  PMHx:  Past Medical History:  Diagnosis Date  . Abnormal blood chemistry   . Arthropathy   . Back pain   . Breast cyst   . Cellulitis   . Chest discomfort   . Constipation   . Disturbance of skin sensation   . Edema   . Encounter for screening and preventative care 06/17/2016  . GERD (gastroesophageal reflux disease)   . Hiatal hernia with gastroesophageal reflux 06/17/2016  . High risk medication use   . Hoarseness 06/15/2016  . Hyperglycemia   . Hyperglycemia 12/21/2016  . Hyperlipidemia   . Hyperlipidemia, mixed 06/15/2016  . Hypertension   . Hypopotassemia   . Hypothyroidism   . Joint pain   .  Menopause   . Obesity   . Palpitations   . Positive ANA (antinuclear antibody)   . Shoulder pain, right   . Sleep apnea   . SOB (shortness of breath)   . Thyroid disease 12/11/2014  . Thyroid nodule   . Urinary incontinence 04/01/2017  . Vitamin D deficiency   . Vitamin D deficiency     Past Surgical History:  Procedure Laterality Date  . BREAST CYST ASPIRATION Left    30 years ago  1970's or 1980's  . INTRAVASCULAR PRESSURE WIRE/FFR STUDY N/A 04/25/2018   Procedure: INTRAVASCULAR PRESSURE WIRE/FFR STUDY;  Surgeon: Jettie Booze, MD;  Location: [facility];  Service:  Cardiovascular;  Laterality: N/A;  . LEFT HEART CATH AND CORONARY ANGIOGRAPHY N/A 04/25/2018   Procedure: LEFT HEART CATH AND CORONARY ANGIOGRAPHY;  Surgeon: Jettie Booze, MD;  Location: [facility];  Service: Cardiovascular;  Laterality: N/A;  . LIPOSUCTION  1990   over gluteal area   . WISDOM TOOTH EXTRACTION     and all upper teeth    FAMHx:  Family History  Problem Relation Age of Onset  . Irregular heart beat Mother   . Heart disease Mother   . Rheumatic fever Mother   . Hypertension Father   . Ulcers Father   . Hyperlipidemia Father   . Vision loss Brother   . Stroke Maternal Grandmother   . Diabetes Maternal Grandmother   . Hypertension Paternal Grandmother   . Ulcers Paternal Grandfather     SOCHx:   reports that she quit smoking about 10 years ago. Her smoking use included cigarettes. She has a 27.00 pack-year smoking history. She has never used smokeless tobacco. She reports current alcohol use. She reports that she does not use drugs.  ALLERGIES:  Allergies  Allergen Reactions  . Crestor [Rosuvastatin Calcium] Other (See Comments)    myalgias  . Diclofenac Itching, Nausea Only and Other (See Comments)    Wheezing  . Hydrochlorothiazide     Wheezing. Losing teeth.  . Statins     ROS: Pertinent items noted in HPI and remainder of comprehensive ROS otherwise negative.  HOME MEDS: Current Outpatient Medications on File Prior to Visit  Medication Sig Dispense Refill  . albuterol (VENTOLIN HFA) 108 (90 Base) MCG/ACT inhaler Inhale 2 puffs into the lungs every 6 (six) hours as needed for wheezing or shortness of breath. 18 g 5  . aspirin EC 81 MG tablet Take 1 tablet (81 mg total) by mouth daily. 90 tablet 3  . COD LIVER OIL PO Take 1 capsule by mouth daily. Reported on 07/16/2015    . Cyanocobalamin (VITAMIN B-12) 500 MCG LOZG Take 500 mcg by mouth daily.     . Evolocumab (REPATHA SURECLICK) [id number] MG/ML SOAJ Inject 1 Dose into the skin every 14  (fourteen) days. 6 pen 3  . irbesartan (AVAPRO) 150 MG tablet Take 1 tablet (150 mg total) by mouth daily. 90 tablet 1  . levothyroxine (SYNTHROID) 137 MCG tablet Take 1 tablet (137 mcg total) by mouth daily before breakfast. 90 tablet 0  . metoprolol succinate (TOPROL-XL) 25 MG 24 hr tablet Take 1 tablet (25 mg total) by mouth daily. 90 tablet 1  . Misc. Devices (PULSE OXIMETER FOR FINGER) MISC 1 Dose by Does not apply route 3 (three) times daily as needed. 1 each 0  . Probiotic Product (PROBIOTIC-10) CAPS Take 1 capsule by mouth daily.     . RED YEAST RICE EXTRACT PO  Take 1 capsule by mouth daily. Reported on 07/16/2015    . Vitamin D, Ergocalciferol, (DRISDOL) 1.25 MG (50000 UT) CAPS capsule TAKE 1 CAPSULE BY MOUTH EVERY 7 DAYS 12 capsule 4  . Wheat Dextrin (BENEFIBER DRINK MIX PO) Take 1 capsule by mouth daily.      No current facility-administered medications on file prior to visit.    LABS/IMAGING: No results found for this or any previous visit (from the past 48 hour(s)). No results found.  LIPID PANEL:    Component Value Date/Time   CHOL 128 10/06/2019 0912   CHOL 137 04/24/2019 1143   TRIG 158.0 (H) 10/06/2019 0912   HDL 44.70 10/06/2019 0912   HDL 48 04/24/2019 1143   CHOLHDL 3 10/06/2019 0912   VLDL 31.6 10/06/2019 0912   LDLCALC 51 10/06/2019 0912   LDLCALC 67 04/24/2019 1143   LDLCALC 183 (H) 12/08/2018 1531   LDLDIRECT 205.0 08/11/2018 1501    WEIGHTS: Wt Readings from Last 3 Encounters:  10/23/19 214 lb 12.8 oz (97.4 kg)  10/05/19 214 lb 12.8 oz (97.4 kg)  06/15/19 215 lb (97.5 kg)    VITALS: BP 132/76   Pulse 62   Ht 5\' 3"  (1.6 m)   Wt 214 lb 12.8 oz (97.4 kg)   SpO2 97%   BMI 38.05 kg/m   EXAM: General appearance: alert and no distress Lungs: clear to auscultation bilaterally Heart: regular rate and rhythm Extremities: extremities normal, atraumatic, no cyanosis or edema Neurologic: Grossly normal  EKG: Deferred  ASSESSMENT: 1. Mixed  dyslipidemia with elevated LDL and triglycerides 2. Coronary artery disease with moderate to severe disease by cath (04/2018)-managed medically 3. Morbid obesity 4. Hypertension 5. Palpitations 6. Hypothyroidism  PLAN: 1.   Destiny [name] seems to be doing well with Repatha.  In general she has had very well-controlled lipids.  I would recommend continuing with it.  Continue physical activity and weight loss.  She has some intermittent chest discomfort however is not clear if this is angina.  Will prescribe some new nitroglycerin.  Have encouraged her to follow-up with Dr. Geraldo Pitter.  Blood pressures well controlled.  She will follow up with her PCP regarding her thyroid.  Follow-up annually or sooner as necessary.  Pixie Casino, MD, [facility], [place] Director of the Advanced Lipid Disorders &  Cardiovascular Risk Reduction Clinic Diplomate of the American Board of Clinical Lipidology Attending Cardiologist  Direct Dial: [phone]  Fax: [phone]  Website:  [email or web address] 10/23/2019, 9:16 AM

## 2019-10-29 ENCOUNTER — Encounter: Payer: Self-pay | Admitting: Family Medicine

## 2019-11-26 ENCOUNTER — Encounter: Payer: Self-pay | Admitting: Family Medicine

## 2019-11-26 DIAGNOSIS — E039 Hypothyroidism, unspecified: Secondary | ICD-10-CM

## 2019-12-05 NOTE — Telephone Encounter (Signed)
Referral sent 

## 2019-12-05 NOTE — Addendum Note (Signed)
Addended by: Kem Boroughs D on: 12/05/2019 10:07 AM   Modules accepted: Orders

## 2020-01-02 ENCOUNTER — Other Ambulatory Visit: Payer: Self-pay | Admitting: Family Medicine

## 2020-01-02 DIAGNOSIS — I10 Essential (primary) hypertension: Secondary | ICD-10-CM

## 2020-01-02 MED ORDER — LEVOTHYROXINE SODIUM 137 MCG PO TABS: 137.0000 ug | ORAL_TABLET | Freq: Every day | ORAL | 1 refills | Status: DC

## 2020-01-02 MED ORDER — IRBESARTAN 150 MG PO TABS: 150.0000 mg | ORAL_TABLET | Freq: Every day | ORAL | 1 refills | Status: DC

## 2020-01-02 MED ORDER — METOPROLOL SUCCINATE ER 25 MG PO TB24: 25.0000 mg | ORAL_TABLET | Freq: Every day | ORAL | 1 refills | Status: DC

## 2020-01-02 NOTE — Telephone Encounter (Signed)
Medication: levothyroxine (SYNTHROID) 137 MCG tablet [[id number]  metoprolol succinate (TOPROL-XL) 25 MG 24 hr tablet [[id number]]   irbesartan (AVAPRO) 150 MG tablet [[id number]]    Has the patient contacted their pharmacy? No. (If no, request that the patient contact the pharmacy for the refill.) (If yes, when and what did the pharmacy advise?)  Preferred Pharmacy (with phone number or street name): [facility] Roselind [facility], [place] - [address]  [address], [place] [address]  Phone:  [phone] Fax:  [phone]  DEA #:  --  Agent: Please be advised that RX refills may take up to 3 business days. We ask that you follow-up with your pharmacy.

## 2020-01-02 NOTE — Telephone Encounter (Signed)
Rxs sent in.  No answer, no vm

## 2020-01-05 NOTE — Telephone Encounter (Signed)
Patient notified that rxs was sent in.

## 2020-01-08 ENCOUNTER — Telehealth: Payer: Self-pay | Admitting: Internal Medicine

## 2020-01-08 NOTE — Telephone Encounter (Signed)
*  STAT* If patient is at the pharmacy, call can be transferred to refill team.   1. Which medications need to be refilled? (please list name of each medication and dose if known)? Evolocumab (REPATHA SURECLICK) 223 MG/ML SOAJ  2. Which pharmacy/location (including street and city if local pharmacy) is medication to be sent to? [facility] - [place], [place] - [address]  3. Do they need a 30 day or 90 day supply? 30 day    Patient took her last injection today.

## 2020-01-10 MED ORDER — REPATHA SURECLICK 140 MG/ML ~~LOC~~ SOAJ: 1.0000 | SUBCUTANEOUS | 3 refills | Status: DC

## 2020-01-10 NOTE — Telephone Encounter (Signed)
Rx(s) sent to pharmacy electronically.  

## 2020-01-15 ENCOUNTER — Telehealth: Payer: Self-pay | Admitting: Family Medicine

## 2020-01-15 NOTE — Telephone Encounter (Signed)
Patient is completely out of medication. Patient states has not received medication. Patient is wondering if you could call in few Medication:  levothyroxine (SYNTHROID) 137 MCG tablet [[id number]]      Has the patient contacted their pharmacy?  (If no, request that the patient contact the pharmacy for the refill.) (If yes, when and what did the pharmacy advise?)     Preferred Pharmacy (with phone number or street name):  [facility], [place] - [address] Phone:  [phone]  Fax:  [phone]          Agent: Please be advised that RX refills may take up to 3 business days. We ask that you follow-up with your pharmacy.

## 2020-01-17 MED ORDER — LEVOTHYROXINE SODIUM 137 MCG PO TABS: 137.0000 ug | ORAL_TABLET | Freq: Every day | ORAL | 1 refills | Status: DC

## 2020-01-17 NOTE — Telephone Encounter (Signed)
Rx sent 

## 2020-02-05 DIAGNOSIS — E119 Type 2 diabetes mellitus without complications: Secondary | ICD-10-CM | POA: Diagnosis not present

## 2020-03-05 DIAGNOSIS — E039 Hypothyroidism, unspecified: Secondary | ICD-10-CM | POA: Diagnosis not present

## 2020-03-08 ENCOUNTER — Encounter: Payer: Self-pay | Admitting: Family Medicine

## 2020-03-11 DIAGNOSIS — L578 Other skin changes due to chronic exposure to nonionizing radiation: Secondary | ICD-10-CM | POA: Diagnosis not present

## 2020-03-11 DIAGNOSIS — L281 Prurigo nodularis: Secondary | ICD-10-CM | POA: Diagnosis not present

## 2020-03-11 DIAGNOSIS — D225 Melanocytic nevi of trunk: Secondary | ICD-10-CM | POA: Diagnosis not present

## 2020-03-11 DIAGNOSIS — L570 Actinic keratosis: Secondary | ICD-10-CM | POA: Diagnosis not present

## 2020-04-18 ENCOUNTER — Ambulatory Visit (INDEPENDENT_AMBULATORY_CARE_PROVIDER_SITE_OTHER): Payer: Medicare Other | Admitting: Family Medicine

## 2020-04-18 ENCOUNTER — Other Ambulatory Visit: Payer: Self-pay

## 2020-04-18 ENCOUNTER — Encounter: Payer: Self-pay | Admitting: Family Medicine

## 2020-04-18 DIAGNOSIS — E538 Deficiency of other specified B group vitamins: Secondary | ICD-10-CM

## 2020-04-18 DIAGNOSIS — Z23 Encounter for immunization: Secondary | ICD-10-CM | POA: Diagnosis not present

## 2020-04-18 DIAGNOSIS — I10 Essential (primary) hypertension: Secondary | ICD-10-CM | POA: Diagnosis not present

## 2020-04-18 DIAGNOSIS — E559 Vitamin D deficiency, unspecified: Secondary | ICD-10-CM | POA: Diagnosis not present

## 2020-04-18 DIAGNOSIS — Z0000 Encounter for general adult medical examination without abnormal findings: Secondary | ICD-10-CM

## 2020-04-18 DIAGNOSIS — N959 Unspecified menopausal and perimenopausal disorder: Secondary | ICD-10-CM | POA: Diagnosis not present

## 2020-04-18 DIAGNOSIS — N8500 Endometrial hyperplasia, unspecified: Secondary | ICD-10-CM | POA: Diagnosis not present

## 2020-04-18 DIAGNOSIS — Z1239 Encounter for other screening for malignant neoplasm of breast: Secondary | ICD-10-CM

## 2020-04-18 DIAGNOSIS — R739 Hyperglycemia, unspecified: Secondary | ICD-10-CM

## 2020-04-18 DIAGNOSIS — R32 Unspecified urinary incontinence: Secondary | ICD-10-CM

## 2020-04-18 DIAGNOSIS — Z9189 Other specified personal risk factors, not elsewhere classified: Secondary | ICD-10-CM | POA: Diagnosis not present

## 2020-04-18 DIAGNOSIS — E039 Hypothyroidism, unspecified: Secondary | ICD-10-CM | POA: Diagnosis not present

## 2020-04-18 DIAGNOSIS — I209 Angina pectoris, unspecified: Secondary | ICD-10-CM | POA: Diagnosis not present

## 2020-04-18 DIAGNOSIS — N6019 Diffuse cystic mastopathy of unspecified breast: Secondary | ICD-10-CM

## 2020-04-18 DIAGNOSIS — R102 Pelvic and perineal pain: Secondary | ICD-10-CM

## 2020-04-18 DIAGNOSIS — E782 Mixed hyperlipidemia: Secondary | ICD-10-CM | POA: Diagnosis not present

## 2020-04-18 DIAGNOSIS — E2839 Other primary ovarian failure: Secondary | ICD-10-CM

## 2020-04-18 NOTE — Assessment & Plan Note (Signed)
Well controlled, no changes to meds. Encouraged heart healthy diet such as the DASH diet and exercise as tolerated.  °

## 2020-04-18 NOTE — Assessment & Plan Note (Addendum)
On Levothyroxine, continue to monitor, following with Dr Tamala Julian who recently added 5 mcg of Cytomel

## 2020-04-18 NOTE — Assessment & Plan Note (Deleted)
Patient encouraged to maintain heart healthy diet, regular exercise, adequate sleep. Consider daily probiotics. Take medications as prescribed. Labs ordered and reviewed. Referred to GYN and MGM and Dexa scan ordered. Colonoscopy due in 2026, would like to switch to LB. Pneumovax and high dose flu today

## 2020-04-18 NOTE — Assessment & Plan Note (Addendum)
Encouraged heart healthy diet, increase exercise, avoid trans fats, consider a krill oil cap daily. Following with lipid clinic and tolerating treatment

## 2020-04-18 NOTE — Assessment & Plan Note (Signed)
Worsening, referred to Dr Felipa Eth at her request

## 2020-04-18 NOTE — Assessment & Plan Note (Signed)
Encouraged DASH diet, decrease po intake and increase exercise as tolerated. Needs 7-8 hours of sleep nightly. Avoid trans fats, eat small, frequent meals every 4-5 hours with lean proteins, complex carbs and healthy fats. Minimize simple carbs, has used the 56 day program in the past with good results. She is going to restart. Increase exercise

## 2020-04-18 NOTE — Progress Notes (Addendum)
Patient ID: Destiny [name], female   DOB: 1950-09-12, 69 y.o.   MRN: [id number]   Subjective:    Patient ID: Destiny [name], female    DOB: [birth year], 69 y.o.   MRN: [id number]  Chief Complaint  Patient presents with  . Follow-up    HPI Patient is in today for annual preventative exam and follow up on chronic medical concerns. No recent febrile illness or hospitalizations. She continues to struggle with urinary frequency and incontinence. No dysuria or hematuria. She struggles with chronic back and joint pains but manages to stay active. Denies CP/palp/SOB/HA/congestion/fevers/GI or GU c/o. Taking meds as prescribed  Past Medical History:  Diagnosis Date  . Abnormal blood chemistry   . Arthropathy   . Back pain   . Breast cyst   . Cellulitis   . Chest discomfort   . Constipation   . Disturbance of skin sensation   . Edema   . Encounter for screening and preventative care 06/17/2016  . GERD (gastroesophageal reflux disease)   . Hiatal hernia with gastroesophageal reflux 06/17/2016  . High risk medication use   . Hoarseness 06/15/2016  . Hyperglycemia   . Hyperglycemia 12/21/2016  . Hyperlipidemia   . Hyperlipidemia, mixed 06/15/2016  . Hypertension   . Hypopotassemia   . Hypothyroidism   . Joint pain   . Menopause   . Obesity   . Palpitations   . Positive ANA (antinuclear antibody)   . Shoulder pain, right   . Sleep apnea   . SOB (shortness of breath)   . Thyroid disease 12/11/2014  . Thyroid nodule   . Urinary incontinence 04/01/2017  . Vitamin D deficiency   . Vitamin D deficiency     Past Surgical History:  Procedure Laterality Date  . BREAST CYST ASPIRATION Left    30 years ago  1970's or 1980's  . INTRAVASCULAR PRESSURE WIRE/FFR STUDY N/A 04/25/2018   Procedure: INTRAVASCULAR PRESSURE WIRE/FFR STUDY;  Surgeon: Jettie Booze, MD;  Location: [facility];  Service: Cardiovascular;  Laterality: N/A;  . LEFT HEART CATH AND CORONARY ANGIOGRAPHY N/A  04/25/2018   Procedure: LEFT HEART CATH AND CORONARY ANGIOGRAPHY;  Surgeon: Jettie Booze, MD;  Location: [facility];  Service: Cardiovascular;  Laterality: N/A;  . LIPOSUCTION  1990   over gluteal area   . WISDOM TOOTH EXTRACTION     and all upper teeth    Family History  Problem Relation Age of Onset  . Irregular heart beat Mother   . Heart disease Mother   . Rheumatic fever Mother   . Hypertension Father   . Ulcers Father   . Hyperlipidemia Father   . Vision loss Brother   . Stroke Maternal Grandmother   . Diabetes Maternal Grandmother   . Hypertension Paternal Grandmother   . Ulcers Paternal Grandfather     Social History   Socioeconomic History  . Marital status: Married    Spouse name: Amillya Chavira  . Number of children: Not on file  . Years of education: Not on file  . Highest education level: Not on file  Occupational History  . Occupation: homemaker  Tobacco Use  . Smoking status: Former Smoker    Packs/day: 1.00    Years: 27.00    Pack years: 27.00    Types: Cigarettes    Quit date: 2011    Years since quitting: 10.8  . Smokeless tobacco: Never Used  Vaping Use  . Vaping Use: Never used  Substance and Sexual Activity  . Alcohol use: Yes    Alcohol/week: 0.0 standard drinks  . Drug use: No  . Sexual activity: Not Currently    Birth control/protection: Post-menopausal    Comment: menopause around age [age]  Other Topics Concern  . Not on file  Social History Narrative   Occasional alcohol use: hard liquor, wine and beer   Social Determinants of Health   Financial Resource Strain:   . Difficulty of Paying Living Expenses: Not on file  Food Insecurity:   . Worried About Charity fundraiser in the Last Year: Not on file  . Ran Out of Food in the Last Year: Not on file  Transportation Needs:   . Lack of Transportation (Medical): Not on file  . Lack of Transportation (Non-Medical): Not on file  Physical Activity:   . Days of Exercise  per Week: Not on file  . Minutes of Exercise per Session: Not on file  Stress:   . Feeling of Stress : Not on file  Social Connections:   . Frequency of Communication with Friends and Family: Not on file  . Frequency of Social Gatherings with Friends and Family: Not on file  . Attends Religious Services: Not on file  . Active Member of Clubs or Organizations: Not on file  . Attends Archivist Meetings: Not on file  . Marital Status: Not on file  Intimate Partner Violence:   . Fear of Current or Ex-Partner: Not on file  . Emotionally Abused: Not on file  . Physically Abused: Not on file  . Sexually Abused: Not on file    Outpatient Medications Prior to Visit  Medication Sig Dispense Refill  . albuterol (VENTOLIN HFA) 108 (90 Base) MCG/ACT inhaler Inhale 2 puffs into the lungs every 6 (six) hours as needed for wheezing or shortness of breath. 18 g 5  . Cyanocobalamin (VITAMIN B-12) 500 MCG LOZG Take 500 mcg by mouth daily.     . Evolocumab (REPATHA SURECLICK) 254 MG/ML SOAJ Inject 1 Dose into the skin every 14 (fourteen) days. 6 pen 3  . irbesartan (AVAPRO) 150 MG tablet Take 1 tablet (150 mg total) by mouth daily. 90 tablet 1  . levothyroxine (SYNTHROID) 137 MCG tablet Take 1 tablet (137 mcg total) by mouth daily before breakfast. 90 tablet 1  . metoprolol succinate (TOPROL-XL) 25 MG 24 hr tablet Take 1 tablet (25 mg total) by mouth daily. 90 tablet 1  . nitroGLYCERIN (NITROSTAT) 0.4 MG SL tablet Place 1 tablet (0.4 mg total) under the tongue every 5 (five) minutes as needed for chest pain. 25 tablet 3  . Probiotic Product (PROBIOTIC-10) CAPS Take 1 capsule by mouth daily.     . Vitamin D, Ergocalciferol, (DRISDOL) 1.25 MG (50000 UT) CAPS capsule TAKE 1 CAPSULE BY MOUTH EVERY 7 DAYS 12 capsule 4  . Wheat Dextrin (BENEFIBER DRINK MIX PO) Take 1 capsule by mouth daily.     . Zinc 100 MG TABS Take by mouth.    Marland Kitchen aspirin EC 81 MG tablet Take 1 tablet (81 mg total) by mouth daily.  90 tablet 3  . liothyronine (CYTOMEL) 5 MCG tablet Take 1 tablet (5 mcg total) by mouth daily.    . Misc. Devices (PULSE OXIMETER FOR FINGER) MISC 1 Dose by Does not apply route 3 (three) times daily as needed. 1 each 0  . RED YEAST RICE EXTRACT PO Take 1 capsule by mouth daily. Reported on 07/16/2015    .  COD LIVER OIL PO Take 1 capsule by mouth daily. Reported on 07/16/2015     No facility-administered medications prior to visit.    Allergies  Allergen Reactions  . Crestor [Rosuvastatin Calcium] Other (See Comments)    myalgias  . Diclofenac Itching, Nausea Only and Other (See Comments)    Wheezing  . Hydrochlorothiazide     Wheezing. Losing teeth.  . Statins     Review of Systems  Constitutional: Negative for fever.  HENT: Negative for congestion.   Eyes: Negative for blurred vision.  Respiratory: Negative for cough.   Cardiovascular: Negative for chest pain and palpitations.  Gastrointestinal: Negative for vomiting.  Genitourinary: Positive for frequency and urgency. Negative for dysuria and hematuria.  Musculoskeletal: Positive for myalgias and neck pain. Negative for back pain.  Skin: Negative for rash.  Neurological: Negative for loss of consciousness, weakness and headaches.       Objective:    Physical Exam Constitutional:      General: She is not in acute distress.    Appearance: She is well-developed.  HENT:     Head: Normocephalic and atraumatic.  Eyes:     Conjunctiva/sclera: Conjunctivae normal.  Neck:     Thyroid: No thyromegaly.  Cardiovascular:     Rate and Rhythm: Normal rate and regular rhythm.     Heart sounds: Normal heart sounds. No murmur heard.   Pulmonary:     Effort: Pulmonary effort is normal. No respiratory distress.     Breath sounds: Normal breath sounds.  Abdominal:     General: Bowel sounds are normal. There is no distension.     Palpations: Abdomen is soft. There is no mass.     Tenderness: There is no abdominal tenderness.    Musculoskeletal:     Cervical back: Neck supple.  Lymphadenopathy:     Cervical: No cervical adenopathy.  Skin:    General: Skin is warm and dry.  Neurological:     Mental Status: She is alert and oriented to person, place, and time.  Psychiatric:        Behavior: Behavior normal.     There were no vitals taken for this visit. Wt Readings from Last 3 Encounters:  10/23/19 214 lb 12.8 oz (97.4 kg)  10/05/19 214 lb 12.8 oz (97.4 kg)  06/15/19 215 lb (97.5 kg)    Diabetic Foot Exam - Simple   No data filed     Lab Results  Component Value Date   WBC 4.3 10/06/2019   HGB 13.9 10/06/2019   HCT 41.8 10/06/2019   PLT 261.0 10/06/2019   GLUCOSE 106 (H) 10/06/2019   CHOL 128 10/06/2019   TRIG 158.0 (H) 10/06/2019   HDL 44.70 10/06/2019   LDLDIRECT 205.0 08/11/2018   LDLCALC 51 10/06/2019   ALT 21 10/06/2019   AST 16 10/06/2019   NA 137 10/06/2019   K 4.4 10/06/2019   CL 100 10/06/2019   CREATININE 0.81 10/06/2019   BUN 17 10/06/2019   CO2 31 10/06/2019   TSH 6.18 (H) 10/06/2019   HGBA1C 5.5 10/06/2019    Lab Results  Component Value Date   TSH 6.18 (H) 10/06/2019   Lab Results  Component Value Date   WBC 4.3 10/06/2019   HGB 13.9 10/06/2019   HCT 41.8 10/06/2019   MCV 91.3 10/06/2019   PLT 261.0 10/06/2019   Lab Results  Component Value Date   NA 137 10/06/2019   K 4.4 10/06/2019   CO2 31 10/06/2019  GLUCOSE 106 (H) 10/06/2019   BUN 17 10/06/2019   CREATININE 0.81 10/06/2019   BILITOT 0.7 10/06/2019   ALKPHOS 53 10/06/2019   AST 16 10/06/2019   ALT 21 10/06/2019   PROT 6.9 10/06/2019   ALBUMIN 4.6 10/06/2019   CALCIUM 9.6 10/06/2019   GFR 70.52 10/06/2019   Lab Results  Component Value Date   CHOL 128 10/06/2019   Lab Results  Component Value Date   HDL 44.70 10/06/2019   Lab Results  Component Value Date   LDLCALC 51 10/06/2019   Lab Results  Component Value Date   TRIG 158.0 (H) 10/06/2019   Lab Results  Component Value Date    CHOLHDL 3 10/06/2019   Lab Results  Component Value Date   HGBA1C 5.5 10/06/2019       Assessment & Plan:   Problem List Items Addressed This Visit    Hypothyroidism (Chronic)    On Levothyroxine, continue to monitor, following with Dr Tamala Julian who recently added 5 mcg of Cytomel      Relevant Medications   liothyronine (CYTOMEL) 5 MCG tablet   Essential hypertension    Well controlled, no changes to meds. Encouraged heart healthy diet such as the DASH diet and exercise as tolerated.       Relevant Orders   CBC   Comprehensive metabolic panel   Preventative health care   Hyperglycemia    hgba1c acceptable, minimize simple carbs. Increase exercise as tolerated.       Relevant Orders   Hemoglobin A1c   Mixed hyperlipidemia    Encouraged heart healthy diet, increase exercise, avoid trans fats, consider a krill oil cap daily. Following with lipid clinic and tolerating treatment      Relevant Orders   Lipid panel   Vitamin D deficiency    Supplement and monitor      Relevant Orders   VITAMIN D 25 Hydroxy (Vit-D Deficiency, Fractures)   Morbid obesity ([place])    Encouraged DASH diet, decrease po intake and increase exercise as tolerated. Needs 7-8 hours of sleep nightly. Avoid trans fats, eat small, frequent meals every 4-5 hours with lean proteins, complex carbs and healthy fats. Minimize simple carbs, has used the 56 day program in the past with good results. She is going to restart. Increase exercise      Relevant Orders   MM DIGITAL SCREENING BILATERAL   Urinary incontinence    Worsening, referred to Dr Felipa Eth at her request      Relevant Orders   Ambulatory referral to Urology   Vitamin B12 deficiency    Supplement and monitor      Relevant Orders   Vitamin B12   Endometrium, hyperplasia    Patient never proceeded with biopsy is referred back to GYN Dr Hulan Fray for ongoing care      Relevant Orders   Ambulatory referral to Obstetrics / Gynecology   MM  DIGITAL SCREENING BILATERAL    Other Visit Diagnoses    Need for prophylactic vaccination against Streptococcus pneumoniae (pneumococcus)    -  Primary   Relevant Orders   Pneumococcal polysaccharide vaccine 23-valent greater than or equal to 2yo subcutaneous/IM (Completed)   Pelvic pain       Relevant Orders   Ambulatory referral to Obstetrics / Gynecology   Encounter for screening for malignant neoplasm of breast, unspecified screening modality       Relevant Orders   MM DIGITAL SCREENING BILATERAL   At high risk for breast cancer  Relevant Orders   MM DIGITAL SCREENING BILATERAL   Post menopausal problems       Relevant Orders   MM DIGITAL SCREENING BILATERAL   DG Bone Density   Fibrocystic breast changes, unspecified laterality       Relevant Orders   MM DIGITAL SCREENING BILATERAL   Estrogen deficiency       Relevant Orders   DG Bone Density   Needs flu shot       Relevant Orders   Flu Vaccine QUAD High Dose(Fluad) (Completed)      I have discontinued Anusha A. Escorcia's COD LIVER OIL PO. I am also having her maintain her RED YEAST RICE EXTRACT PO, Vitamin B-12, Probiotic-10, Wheat Dextrin (BENEFIBER DRINK MIX PO), albuterol, Vitamin D (Ergocalciferol), aspirin EC, Pulse Oximeter For Finger, nitroGLYCERIN, irbesartan, metoprolol succinate, Repatha SureClick, levothyroxine, Zinc, and liothyronine.  No orders of the defined types were placed in this encounter.    Penni Homans, MD

## 2020-04-18 NOTE — Assessment & Plan Note (Signed)
Supplement and monitor 

## 2020-04-18 NOTE — Assessment & Plan Note (Signed)
Patient never proceeded with biopsy is referred back to GYN Dr Hulan Fray for ongoing care

## 2020-04-18 NOTE — Assessment & Plan Note (Addendum)
hgba1c acceptable, minimize simple carbs. Increase exercise as tolerated.  

## 2020-04-18 NOTE — Patient Instructions (Signed)
Preventive Care 38 Years and Older, Female Preventive care refers to lifestyle choices and visits with your health care provider that can promote health and wellness. This includes:  A yearly physical exam. This is also called an annual well check.  Regular dental and eye exams.  Immunizations.  Screening for certain conditions.  Healthy lifestyle choices, such as diet and exercise. What can I expect for my preventive care visit? Physical exam Your health care provider will check:  Height and weight. These may be used to calculate body mass index (BMI), which is a measurement that tells if you are at a healthy weight.  Heart rate and blood pressure.  Your skin for abnormal spots. Counseling Your health care provider may ask you questions about:  Alcohol, tobacco, and drug use.  Emotional well-being.  Home and relationship well-being.  Sexual activity.  Eating habits.  History of falls.  Memory and ability to understand (cognition).  Work and work Statistician.  Pregnancy and menstrual history. What immunizations do I need?  Influenza (flu) vaccine  This is recommended every year. Tetanus, diphtheria, and pertussis (Tdap) vaccine  You may need a Td booster every 10 years. Varicella (chickenpox) vaccine  You may need this vaccine if you have not already been vaccinated. Zoster (shingles) vaccine  You may need this after age 67. Pneumococcal conjugate (PCV13) vaccine  One dose is recommended after age 67. Pneumococcal polysaccharide (PPSV23) vaccine  One dose is recommended after age 67. Measles, mumps, and rubella (MMR) vaccine  You may need at least one dose of MMR if you were born in 1957 or later. You may also need a second dose. Meningococcal conjugate (MenACWY) vaccine  You may need this if you have certain conditions. Hepatitis A vaccine  You may need this if you have certain conditions or if you travel or work in places where you may be exposed  to hepatitis A. Hepatitis B vaccine  You may need this if you have certain conditions or if you travel or work in places where you may be exposed to hepatitis B. Haemophilus influenzae type b (Hib) vaccine  You may need this if you have certain conditions. You may receive vaccines as individual doses or as more than one vaccine together in one shot (combination vaccines). Talk with your health care provider about the risks and benefits of combination vaccines. What tests do I need? Blood tests  Lipid and cholesterol levels. These may be checked every 5 years, or more frequently depending on your overall health.  Hepatitis C test.  Hepatitis B test. Screening  Lung cancer screening. You may have this screening every year starting at age 67 if you have a 30-pack-year history of smoking and currently smoke or have quit within the past 15 years.  Colorectal cancer screening. All adults should have this screening starting at age 67 and continuing until age 15. Your health care provider may recommend screening at age 67 if you are at increased risk. You will have tests every 1-10 years, depending on your results and the type of screening test.  Diabetes screening. This is done by checking your blood sugar (glucose) after you have not eaten for a while (fasting). You may have this done every 1-3 years.  Mammogram. This may be done every 1-2 years. Talk with your health care provider about how often you should have regular mammograms.  BRCA-related cancer screening. This may be done if you have a family history of breast, ovarian, tubal, or peritoneal cancers.  Other tests  Sexually transmitted disease (STD) testing.  Bone density scan. This is done to screen for osteoporosis. You may have this done starting at age 67. Follow these instructions at home: Eating and drinking  Eat a diet that includes fresh fruits and vegetables, whole grains, lean protein, and low-fat dairy products. Limit  your intake of foods with high amounts of sugar, saturated fats, and salt.  Take vitamin and mineral supplements as recommended by your health care provider.  Do not drink alcohol if your health care provider tells you not to drink.  If you drink alcohol: ? Limit how much you have to 0-1 drink a day. ? Be aware of how much alcohol is in your drink. In the [place] one drink equals one 12 oz bottle of beer (355 mL), one 5 oz glass of wine (148 mL), or one 1 oz glass of hard liquor (44 mL). Lifestyle  Take daily care of your teeth and gums.  Stay active. Exercise for at least 30 minutes on 5 or more days each week.  Do not use any products that contain nicotine or tobacco, such as cigarettes, e-cigarettes, and chewing tobacco. If you need help quitting, ask your health care provider.  If you are sexually active, practice safe sex. Use a condom or other form of protection in order to prevent STIs (sexually transmitted infections).  Talk with your health care provider about taking a low-dose aspirin or statin. What's next?  Go to your health care provider once a year for a well check visit.  Ask your health care provider how often you should have your eyes and teeth checked.  Stay up to date on all vaccines. This information is not intended to replace advice given to you by your health care provider. Make sure you discuss any questions you have with your health care provider. Document Revised: 05/19/2018 Document Reviewed: 05/19/2018 Elsevier Patient Education  2020 [employer].

## 2020-04-19 ENCOUNTER — Encounter: Payer: Self-pay | Admitting: Family Medicine

## 2020-04-19 LAB — HEMOGLOBIN A1C: Hgb A1c MFr Bld: 5.9 % (ref 4.6–6.5)

## 2020-04-19 LAB — COMPREHENSIVE METABOLIC PANEL
ALT: 24 U/L (ref 0–35)
AST: 17 U/L (ref 0–37)
Albumin: 4.6 g/dL (ref 3.5–5.2)
Alkaline Phosphatase: 64 U/L (ref 39–117)
BUN: 17 mg/dL (ref 6–23)
CO2: 30 mEq/L (ref 19–32)
Calcium: 9.9 mg/dL (ref 8.4–10.5)
Chloride: 100 mEq/L (ref 96–112)
Creatinine, Ser: 0.84 mg/dL (ref 0.40–1.20)
GFR: 71.83 mL/min (ref >60.00)
Glucose, Bld: 95 mg/dL (ref 70–99)
Potassium: 4.8 mEq/L (ref 3.5–5.1)
Sodium: 137 mEq/L (ref 135–145)
Total Bilirubin: 0.4 mg/dL (ref 0.2–1.2)
Total Protein: 7.4 g/dL (ref 6.0–8.3)

## 2020-04-19 LAB — VITAMIN D 25 HYDROXY (VIT D DEFICIENCY, FRACTURES): VITD: 38.39 ng/mL (ref 30.00–100.00)

## 2020-04-19 LAB — LIPID PANEL
Cholesterol: 137 mg/dL (ref 0–200)
HDL: 46.5 mg/dL (ref >39.00)
NonHDL: 90.6
Total CHOL/HDL Ratio: 3
Triglycerides: 237 mg/dL — ABNORMAL HIGH (ref 0.0–149.0)
VLDL: 47.4 mg/dL — ABNORMAL HIGH (ref 0.0–40.0)

## 2020-04-19 LAB — CBC
HCT: 43.1 % (ref 36.0–46.0)
Hemoglobin: 14.5 g/dL (ref 12.0–15.0)
MCHC: 33.6 g/dL (ref 30.0–36.0)
MCV: 90.3 fl (ref 78.0–100.0)
Platelets: 272 10*3/uL (ref 150.0–400.0)
RBC: 4.77 Mil/uL (ref 3.87–5.11)
RDW: 13.2 % (ref 11.5–15.5)
WBC: 4.6 10*3/uL (ref 4.0–10.5)

## 2020-04-19 LAB — LDL CHOLESTEROL, DIRECT: Direct LDL: 71 mg/dL

## 2020-04-19 LAB — VITAMIN B12: Vitamin B-12: 755 pg/mL (ref 211–911)

## 2020-05-01 ENCOUNTER — Other Ambulatory Visit: Payer: Self-pay

## 2020-05-01 ENCOUNTER — Other Ambulatory Visit: Payer: Self-pay | Admitting: Family Medicine

## 2020-05-01 DIAGNOSIS — I10 Essential (primary) hypertension: Secondary | ICD-10-CM

## 2020-05-01 MED ORDER — METOPROLOL SUCCINATE ER 25 MG PO TB24: 25.0000 mg | ORAL_TABLET | Freq: Every day | ORAL | 1 refills | Status: DC

## 2020-05-01 MED ORDER — IRBESARTAN 150 MG PO TABS
150.0000 mg | ORAL_TABLET | Freq: Every day | ORAL | 1 refills | Status: DC
Start: 2020-05-01 — End: 2020-12-23

## 2020-05-01 NOTE — Telephone Encounter (Signed)
Refill sent to pharmacy for patient.

## 2020-05-01 NOTE — Telephone Encounter (Signed)
Would you like Pt to continue Ergocalciferol?  

## 2020-05-01 NOTE — Telephone Encounter (Signed)
Patient called stating she needs refills on metoprolol succinate 25MG  24 HR Tab & irbesartan 150 MG TAB.  Pharmacy is [facility].

## 2020-05-20 ENCOUNTER — Encounter: Payer: Self-pay | Admitting: Family Medicine

## 2020-05-29 DIAGNOSIS — I8312 Varicose veins of left lower extremity with inflammation: Secondary | ICD-10-CM | POA: Diagnosis not present

## 2020-06-08 HISTORY — PX: VEIN SURGERY: SHX48

## 2020-07-03 ENCOUNTER — Other Ambulatory Visit: Payer: Self-pay | Admitting: Family Medicine

## 2020-07-03 ENCOUNTER — Ambulatory Visit (INDEPENDENT_AMBULATORY_CARE_PROVIDER_SITE_OTHER): Payer: Medicare Other

## 2020-07-03 ENCOUNTER — Other Ambulatory Visit: Payer: Self-pay

## 2020-07-03 ENCOUNTER — Ambulatory Visit: Payer: Medicare Other

## 2020-07-03 DIAGNOSIS — N8500 Endometrial hyperplasia, unspecified: Secondary | ICD-10-CM

## 2020-07-03 DIAGNOSIS — E039 Hypothyroidism, unspecified: Secondary | ICD-10-CM | POA: Diagnosis not present

## 2020-07-03 DIAGNOSIS — M8589 Other specified disorders of bone density and structure, multiple sites: Secondary | ICD-10-CM | POA: Diagnosis not present

## 2020-07-03 DIAGNOSIS — Z9189 Other specified personal risk factors, not elsewhere classified: Secondary | ICD-10-CM

## 2020-07-03 DIAGNOSIS — N959 Unspecified menopausal and perimenopausal disorder: Secondary | ICD-10-CM

## 2020-07-03 DIAGNOSIS — Z1239 Encounter for other screening for malignant neoplasm of breast: Secondary | ICD-10-CM

## 2020-07-03 DIAGNOSIS — E2839 Other primary ovarian failure: Secondary | ICD-10-CM | POA: Diagnosis not present

## 2020-07-03 DIAGNOSIS — N6019 Diffuse cystic mastopathy of unspecified breast: Secondary | ICD-10-CM

## 2020-07-03 DIAGNOSIS — Z1231 Encounter for screening mammogram for malignant neoplasm of breast: Secondary | ICD-10-CM

## 2020-07-03 DIAGNOSIS — Z780 Asymptomatic menopausal state: Secondary | ICD-10-CM | POA: Diagnosis not present

## 2020-07-04 DIAGNOSIS — E6609 Other obesity due to excess calories: Secondary | ICD-10-CM | POA: Diagnosis not present

## 2020-07-04 DIAGNOSIS — E039 Hypothyroidism, unspecified: Secondary | ICD-10-CM | POA: Diagnosis not present

## 2020-07-04 DIAGNOSIS — I8312 Varicose veins of left lower extremity with inflammation: Secondary | ICD-10-CM | POA: Diagnosis not present

## 2020-07-04 DIAGNOSIS — Z6839 Body mass index (BMI) 39.0-39.9, adult: Secondary | ICD-10-CM | POA: Diagnosis not present

## 2020-07-05 DIAGNOSIS — I8312 Varicose veins of left lower extremity with inflammation: Secondary | ICD-10-CM | POA: Diagnosis not present

## 2020-07-12 DIAGNOSIS — I8312 Varicose veins of left lower extremity with inflammation: Secondary | ICD-10-CM | POA: Diagnosis not present

## 2020-07-26 DIAGNOSIS — I8312 Varicose veins of left lower extremity with inflammation: Secondary | ICD-10-CM | POA: Diagnosis not present

## 2020-08-14 DIAGNOSIS — I8312 Varicose veins of left lower extremity with inflammation: Secondary | ICD-10-CM | POA: Diagnosis not present

## 2020-08-21 ENCOUNTER — Other Ambulatory Visit: Payer: Self-pay

## 2020-08-21 ENCOUNTER — Ambulatory Visit (INDEPENDENT_AMBULATORY_CARE_PROVIDER_SITE_OTHER): Payer: Medicare Other

## 2020-08-21 DIAGNOSIS — Z1231 Encounter for screening mammogram for malignant neoplasm of breast: Secondary | ICD-10-CM

## 2020-08-22 ENCOUNTER — Other Ambulatory Visit: Payer: Self-pay | Admitting: Internal Medicine

## 2020-08-22 ENCOUNTER — Emergency Department (HOSPITAL_BASED_OUTPATIENT_CLINIC_OR_DEPARTMENT_OTHER)
Admission: EM | Admit: 2020-08-22 | Discharge: 2020-08-22 | Disposition: A | Discharge status: Home / Self Care | Payer: Medicare Other | Attending: Emergency Medicine | Admitting: Emergency Medicine

## 2020-08-22 ENCOUNTER — Emergency Department (HOSPITAL_BASED_OUTPATIENT_CLINIC_OR_DEPARTMENT_OTHER): Payer: Medicare Other

## 2020-08-22 ENCOUNTER — Other Ambulatory Visit: Payer: Self-pay

## 2020-08-22 ENCOUNTER — Other Ambulatory Visit: Payer: Self-pay | Admitting: *Deleted

## 2020-08-22 DIAGNOSIS — I2510 Atherosclerotic heart disease of native coronary artery without angina pectoris: Secondary | ICD-10-CM | POA: Diagnosis not present

## 2020-08-22 DIAGNOSIS — Z79899 Other long term (current) drug therapy: Secondary | ICD-10-CM | POA: Insufficient documentation

## 2020-08-22 DIAGNOSIS — I10 Essential (primary) hypertension: Secondary | ICD-10-CM | POA: Insufficient documentation

## 2020-08-22 DIAGNOSIS — E039 Hypothyroidism, unspecified: Secondary | ICD-10-CM | POA: Insufficient documentation

## 2020-08-22 DIAGNOSIS — R0981 Nasal congestion: Secondary | ICD-10-CM | POA: Insufficient documentation

## 2020-08-22 DIAGNOSIS — M791 Myalgia, unspecified site: Secondary | ICD-10-CM

## 2020-08-22 DIAGNOSIS — K760 Fatty (change of) liver, not elsewhere classified: Secondary | ICD-10-CM | POA: Diagnosis not present

## 2020-08-22 DIAGNOSIS — K8020 Calculus of gallbladder without cholecystitis without obstruction: Secondary | ICD-10-CM

## 2020-08-22 DIAGNOSIS — R1110 Vomiting, unspecified: Secondary | ICD-10-CM | POA: Insufficient documentation

## 2020-08-22 DIAGNOSIS — T466X5A Adverse effect of antihyperlipidemic and antiarteriosclerotic drugs, initial encounter: Secondary | ICD-10-CM

## 2020-08-22 DIAGNOSIS — J069 Acute upper respiratory infection, unspecified: Secondary | ICD-10-CM

## 2020-08-22 DIAGNOSIS — R109 Unspecified abdominal pain: Secondary | ICD-10-CM | POA: Insufficient documentation

## 2020-08-22 DIAGNOSIS — E782 Mixed hyperlipidemia: Secondary | ICD-10-CM

## 2020-08-22 DIAGNOSIS — R059 Cough, unspecified: Secondary | ICD-10-CM | POA: Diagnosis not present

## 2020-08-22 DIAGNOSIS — N390 Urinary tract infection, site not specified: Secondary | ICD-10-CM | POA: Diagnosis not present

## 2020-08-22 DIAGNOSIS — B9789 Other viral agents as the cause of diseases classified elsewhere: Secondary | ICD-10-CM | POA: Diagnosis not present

## 2020-08-22 DIAGNOSIS — Z87891 Personal history of nicotine dependence: Secondary | ICD-10-CM | POA: Diagnosis not present

## 2020-08-22 LAB — URINALYSIS, ROUTINE W REFLEX MICROSCOPIC
Bilirubin Urine: NEGATIVE
Glucose, UA: NEGATIVE mg/dL
Ketones, ur: NEGATIVE mg/dL
Leukocytes,Ua: NEGATIVE
Nitrite: NEGATIVE
Protein, ur: NEGATIVE mg/dL
Specific Gravity, Urine: 1.015 (ref 1.005–1.030)
pH: 6.5 (ref 5.0–8.0)

## 2020-08-22 LAB — URINALYSIS, MICROSCOPIC (REFLEX): WBC, UA: NONE SEEN WBC/hpf (ref 0–5)

## 2020-08-22 LAB — COMPREHENSIVE METABOLIC PANEL
ALT: 34 U/L (ref 0–44)
AST: 28 U/L (ref 15–41)
Albumin: 4.7 g/dL (ref 3.5–5.0)
Alkaline Phosphatase: 66 U/L (ref 38–126)
Anion gap: 13 (ref 5–15)
BUN: 11 mg/dL (ref 8–23)
CO2: 21 mmol/L — ABNORMAL LOW (ref 22–32)
Calcium: 9.7 mg/dL (ref 8.9–10.3)
Chloride: 100 mmol/L (ref 98–111)
Creatinine, Ser: 0.65 mg/dL (ref 0.44–1.00)
GFR, Estimated: >60 mL/min (ref >60)
Glucose, Bld: 146 mg/dL — ABNORMAL HIGH (ref 70–99)
Potassium: 3.9 mmol/L (ref 3.5–5.1)
Sodium: 134 mmol/L — ABNORMAL LOW (ref 135–145)
Total Bilirubin: 0.7 mg/dL (ref 0.3–1.2)
Total Protein: 8.1 g/dL (ref 6.5–8.1)

## 2020-08-22 LAB — CBC
HCT: 43.7 % (ref 36.0–46.0)
Hemoglobin: 14.8 g/dL (ref 12.0–15.0)
MCH: 29.8 pg (ref 26.0–34.0)
MCHC: 33.9 g/dL (ref 30.0–36.0)
MCV: 88.1 fL (ref 80.0–100.0)
Platelets: 235 10*3/uL (ref 150–400)
RBC: 4.96 MIL/uL (ref 3.87–5.11)
RDW: 12.7 % (ref 11.5–15.5)
WBC: 9 10*3/uL (ref 4.0–10.5)
nRBC: 0 % (ref 0.0–0.2)

## 2020-08-22 LAB — LIPASE, BLOOD: Lipase: 27 U/L (ref 11–51)

## 2020-08-22 MED ORDER — SODIUM CHLORIDE 0.9 % IV SOLN: INTRAVENOUS | Status: DC

## 2020-08-22 MED ORDER — SODIUM CHLORIDE 0.9 % IV BOLUS
1000.0000 mL | Freq: Once | INTRAVENOUS | Status: AC
  Administered 2020-08-22: 1000 mL via INTRAVENOUS

## 2020-08-22 MED ORDER — IOHEXOL 300 MG/ML  SOLN
100.0000 mL | Freq: Once | INTRAMUSCULAR | Status: AC | PRN
  Administered 2020-08-22: 100 mL via INTRAVENOUS

## 2020-08-22 MED ORDER — ONDANSETRON HCL 4 MG/2ML IJ SOLN
4.0000 mg | Freq: Once | INTRAMUSCULAR | Status: AC
  Administered 2020-08-22: 4 mg via INTRAVENOUS
  Filled 2020-08-22: qty 2

## 2020-08-22 MED ORDER — ONDANSETRON 4 MG PO TBDP
4.0000 mg | ORAL_TABLET | Freq: Three times a day (TID) | ORAL | 1 refills | Status: DC | PRN
Start: 2020-08-22 — End: 2020-09-04

## 2020-08-22 NOTE — ED Triage Notes (Signed)
Pt. Reports she started with allergy symptoms yesterday and then today she started with the vomiting each time she eats.  Pt. Has loose stools not diarrhea.  Pt. Is urinating well with no difference from her norm.

## 2020-08-22 NOTE — Discharge Instructions (Addendum)
Take the Zofran as needed for the nausea and vomiting.  Return for any abdominal pain lasting 3 hours or longer.  Or for development of fevers.  Chest x-ray negative for pneumonia.  CT scan did show evidence of gallstones.  Which may be part of your symptoms.   Call general surgery for follow-up.  Recommend Zyrtec clear liquid diet for the next 24 hours and then a bland diet and try to avoid fatty foods.

## 2020-08-22 NOTE — ED Provider Notes (Addendum)
[facility] Provider Note   CSN: [id number] Arrival date & time: 08/22/20  2013     History Chief Complaint  Patient presents with  . Emesis    Destiny [name] is a 70 y.o. female.  Patient with cough and congestion that started yesterday.  And started with vomiting each time she eats.  And has had loose stools but patient does not consider it diarrhea.  Patient gets some abdominal discomfort when she eats.  Patient still has her gallbladder.  Patient's had all of the 3 Covid vaccines.  Had the booster about 6 weeks ago.        Past Medical History:  Diagnosis Date  . Abnormal blood chemistry   . Arthropathy   . Back pain   . Breast cyst   . Cellulitis   . Chest discomfort   . Constipation   . Disturbance of skin sensation   . Edema   . Encounter for screening and preventative care 06/17/2016  . GERD (gastroesophageal reflux disease)   . Hiatal hernia with gastroesophageal reflux 06/17/2016  . High risk medication use   . Hoarseness 06/15/2016  . Hyperglycemia   . Hyperglycemia 12/21/2016  . Hyperlipidemia   . Hyperlipidemia, mixed 06/15/2016  . Hypertension   . Hypopotassemia   . Hypothyroidism   . Joint pain   . Menopause   . Obesity   . Palpitations   . Positive ANA (antinuclear antibody)   . Shoulder pain, right   . Sleep apnea   . SOB (shortness of breath)   . Thyroid disease 12/11/2014  . Thyroid nodule   . Urinary incontinence 04/01/2017  . Vitamin D deficiency   . Vitamin D deficiency     Patient Active Problem List   Diagnosis Date Noted  . Family history of premature CAD 05/13/2018  . CAD (coronary artery disease) 05/13/2018  . Endometrium, hyperplasia 04/13/2018  . Vitamin B12 deficiency 04/12/2018  . Arthritis 04/12/2018  . Constipation 02/10/2018  . Abdominal pain 02/10/2018  . Atypical chest pain 02/10/2018  . Dyspepsia 02/10/2018  . Urinary incontinence 04/01/2017  . Morbid obesity ([place]) 01/25/2017  . Mixed  hyperlipidemia 01/07/2017  . Vitamin D deficiency 01/07/2017  . Hyperglycemia 12/21/2016  . Plantar fasciitis 09/02/2016  . Preventative health care 06/17/2016  . Hiatal hernia with gastroesophageal reflux 06/17/2016  . Hoarseness 06/15/2016  . DDD (degenerative disc disease), cervical 05/04/2016  . Foraminal stenosis of cervical region 05/04/2016  . Neck pain 05/04/2016  . Numbness and tingling in both hands 05/04/2016  . Essential hypertension 03/26/2016  . Hypothyroidism 12/11/2014    Past Surgical History:  Procedure Laterality Date  . BREAST CYST ASPIRATION Left    30 years ago  1970's or 1980's  . INTRAVASCULAR PRESSURE WIRE/FFR STUDY N/A 04/25/2018   Procedure: INTRAVASCULAR PRESSURE WIRE/FFR STUDY;  Surgeon: Jettie Booze, MD;  Location: [facility];  Service: Cardiovascular;  Laterality: N/A;  . LEFT HEART CATH AND CORONARY ANGIOGRAPHY N/A 04/25/2018   Procedure: LEFT HEART CATH AND CORONARY ANGIOGRAPHY;  Surgeon: Jettie Booze, MD;  Location: [facility];  Service: Cardiovascular;  Laterality: N/A;  . LIPOSUCTION  1990   over gluteal area   . WISDOM TOOTH EXTRACTION     and all upper teeth     OB History    Gravida  1   Para  0   Term      Preterm      AB  1  Living        SAB  1   IAB      Ectopic      Multiple      Live Births              Family History  Problem Relation Age of Onset  . Irregular heart beat Mother   . Heart disease Mother   . Rheumatic fever Mother   . Hypertension Father   . Ulcers Father   . Hyperlipidemia Father   . Stroke Maternal Grandmother   . Diabetes Maternal Grandmother   . Hypertension Paternal Grandmother   . Ulcers Paternal Grandfather     Social History   Tobacco Use  . Smoking status: Former Smoker    Packs/day: 1.00    Years: 27.00    Pack years: 27.00    Types: Cigarettes    Quit date: 2011    Years since quitting: 11.2  . Smokeless tobacco: Never Used  Vaping  Use  . Vaping Use: Never used  Substance Use Topics  . Alcohol use: Yes    Alcohol/week: 0.0 standard drinks  . Drug use: No    Home Medications Prior to Admission medications   Medication Sig Start Date End Date Taking? Authorizing Provider  thyroid (ARMOUR) 90 MG tablet Take 90 mg by mouth daily.   Yes [provider]  albuterol (VENTOLIN HFA) 108 (90 Base) MCG/ACT inhaler Inhale 2 puffs into the lungs every 6 (six) hours as needed for wheezing or shortness of breath. 11/01/18   Mosie Lukes, MD  aspirin EC 81 MG tablet Take 1 tablet (81 mg total) by mouth daily. 06/15/19   Mosie Lukes, MD  Cyanocobalamin (VITAMIN B-12) 500 MCG LOZG Take 500 mcg by mouth daily.     [provider]  Evolocumab (REPATHA SURECLICK) 093 MG/ML SOAJ Inject 1 Dose into the skin every 14 (fourteen) days. 01/10/20   Hilty, Nadean Corwin, MD  irbesartan (AVAPRO) 150 MG tablet Take 1 tablet (150 mg total) by mouth daily. 05/01/20   Mosie Lukes, MD  levothyroxine (SYNTHROID) 137 MCG tablet Take 1 tablet (137 mcg total) by mouth daily before breakfast. 01/17/20   Mosie Lukes, MD  liothyronine (CYTOMEL) 5 MCG tablet Take 1 tablet (5 mcg total) by mouth daily. 04/18/20   Mosie Lukes, MD  metoprolol succinate (TOPROL-XL) 25 MG 24 hr tablet Take 1 tablet (25 mg total) by mouth daily. 05/01/20   Mosie Lukes, MD  Misc. Devices (PULSE OXIMETER FOR FINGER) MISC 1 Dose by Does not apply route 3 (three) times daily as needed. 06/15/19   Mosie Lukes, MD  nitroGLYCERIN (NITROSTAT) 0.4 MG SL tablet Place 1 tablet (0.4 mg total) under the tongue every 5 (five) minutes as needed for chest pain. 10/23/19   Hilty, Nadean Corwin, MD  Probiotic Product (PROBIOTIC-10) CAPS Take 1 capsule by mouth daily.     [provider]  RED YEAST RICE EXTRACT PO Take 1 capsule by mouth daily. Reported on 07/16/2015    [provider]  Vitamin D, Ergocalciferol, (DRISDOL) 1.25 MG (50000 UNIT) CAPS capsule TAKE  ONE CAPSULE BY MOUTH EVERY SEVEN DAYS (MAY CONTAIN SOY OR PEANUT--TALK TO YOUR DOCTOR BEFORE TAKING IF YOU ARE ALLERGIC) 05/01/20   Mosie Lukes, MD  Wheat Dextrin (BENEFIBER DRINK MIX PO) Take 1 capsule by mouth daily.     [provider]  Zinc 100 MG TABS Take by mouth.  [provider]    Allergies    Crestor [rosuvastatin calcium], Diclofenac, Hydrochlorothiazide, and Statins  Review of Systems   Review of Systems  Constitutional: Negative for chills and fever.  HENT: Positive for congestion. Negative for rhinorrhea and sore throat.   Eyes: Negative for visual disturbance.  Respiratory: Positive for cough. Negative for shortness of breath.   Cardiovascular: Negative for chest pain and leg swelling.  Gastrointestinal: Positive for abdominal pain, diarrhea, nausea and vomiting.  Genitourinary: Negative for dysuria.  Musculoskeletal: Negative for back pain and neck pain.  Skin: Negative for rash.  Neurological: Negative for dizziness, light-headedness and headaches.  Hematological: Does not bruise/bleed easily.  Psychiatric/Behavioral: Negative for confusion.    Physical Exam Updated Vital Signs BP (!) 160/80   Pulse 87   Temp 98.1 F (36.7 C) (Oral)   Resp 18   Ht 1.6 m (5\' 3" )   Wt 99.8 kg   SpO2 97%   BMI 38.97 kg/m   Physical Exam Vitals and nursing note reviewed.  Constitutional:      General: She is not in acute distress.    Appearance: Normal appearance. She is well-developed.  HENT:     Head: Normocephalic and atraumatic.  Eyes:     Conjunctiva/sclera: Conjunctivae normal.     Pupils: Pupils are equal, round, and reactive to light.  Cardiovascular:     Rate and Rhythm: Normal rate and regular rhythm.     Heart sounds: No murmur heard.   Pulmonary:     Effort: Pulmonary effort is normal. No respiratory distress.     Breath sounds: Normal breath sounds.  Abdominal:     General: There is no distension.     Palpations: Abdomen is  soft.     Tenderness: There is no abdominal tenderness. There is no guarding.     Comments: Abdomen soft nontender.  No tenderness in the right upper quadrant at all.  Musculoskeletal:        General: Normal range of motion.     Cervical back: Neck supple.  Skin:    General: Skin is warm and dry.     Capillary Refill: Capillary refill takes less than 2 seconds.  Neurological:     General: No focal deficit present.     Mental Status: She is alert and oriented to person, place, and time.     Cranial Nerves: No cranial nerve deficit.     Sensory: No sensory deficit.     Motor: No weakness.     ED Results / Procedures / Treatments   Labs (all labs ordered are listed, but only abnormal results are displayed) Labs Reviewed  COMPREHENSIVE METABOLIC PANEL - Abnormal; Notable for the following components:      Result Value   Sodium 134 (*)    CO2 21 (*)    Glucose, Bld 146 (*)    All other components within normal limits  URINALYSIS, ROUTINE W REFLEX MICROSCOPIC - Abnormal; Notable for the following components:   Hgb urine dipstick TRACE (*)    All other components within normal limits  URINALYSIS, MICROSCOPIC (REFLEX) - Abnormal; Notable for the following components:   Bacteria, UA RARE (*)    All other components within normal limits  LIPASE, BLOOD  CBC    EKG None  Radiology CT Abdomen Pelvis W Contrast  Result Date: 08/22/2020 CLINICAL DATA:  Vomiting, abdominal pain, loose stools EXAM: CT ABDOMEN AND PELVIS WITH CONTRAST TECHNIQUE: Multidetector CT imaging of the abdomen and pelvis was  performed using the standard protocol following bolus administration of intravenous contrast. CONTRAST:  162mL OMNIPAQUE IOHEXOL 300 MG/ML  SOLN COMPARISON:  None. FINDINGS: Lower chest: No acute pleural or parenchymal lung disease. Hepatobiliary: Diffuse hepatic steatosis without focal liver abnormality. No biliary dilation. Calcified gallstones without cholecystitis. Pancreas: Unremarkable. No  pancreatic ductal dilatation or surrounding inflammatory changes. Spleen: Normal in size without focal abnormality. Adrenals/Urinary Tract: Adrenal glands are unremarkable. Kidneys are normal, without renal calculi, focal lesion, or hydronephrosis. Bladder is unremarkable. Stomach/Bowel: No bowel obstruction or ileus. Normal appendix right lower quadrant. No bowel wall thickening or inflammatory change. Vascular/Lymphatic: Aortic atherosclerosis. No enlarged abdominal or pelvic lymph nodes. Reproductive: Uterus and bilateral adnexa are unremarkable. Other: No free fluid or free gas.  No abdominal wall hernia. Musculoskeletal: No acute or destructive bony lesions. Reconstructed images demonstrate no additional findings. IMPRESSION: 1. Cholelithiasis without cholecystitis. 2. Hepatic steatosis. 3.  Aortic Atherosclerosis (ICD10-I70.0). Electronically Signed   By: Randa Ngo M.D.   On: 08/22/2020 23:11   DG Chest Port 1 View  Result Date: 08/22/2020 CLINICAL DATA:  Cough, congestion, allergy symptoms EXAM: PORTABLE CHEST 1 VIEW COMPARISON:  04/21/2018 FINDINGS: The heart size and mediastinal contours are within normal limits. Both lungs are clear. The visualized skeletal structures are unremarkable. IMPRESSION: No active disease. Electronically Signed   By: Randa Ngo M.D.   On: 08/22/2020 22:32    Procedures Procedures   Medications Ordered in ED Medications  0.9 %  sodium chloride infusion (0 mLs Intravenous Hold 08/22/20 2214)  sodium chloride 0.9 % bolus 1,000 mL (1,000 mLs Intravenous New Bag/Given 08/22/20 2214)  ondansetron (ZOFRAN) injection 4 mg (4 mg Intravenous Given 08/22/20 2214)  iohexol (OMNIPAQUE) 300 MG/ML solution 100 mL (100 mLs Intravenous Contrast Given 08/22/20 2304)    ED Course  I have reviewed the triage vital signs and the nursing notes.  Pertinent labs & imaging results that were available during my care of the patient were reviewed by me and considered in my medical  decision making (see chart for details).    MDM Rules/Calculators/A&P                         Work-up for the abdominal pain urinalysis negative for urinary tract infection.  Liver function test without any significant abnormalities.  Electrolytes sodium 134 CO2 21 glucose 146.  Renal functions normal. Normal anion gap no leukocytosis.  Hemoglobin is 14.8.  Lipase is normal at 27.  Chest x-ray without any acute findings.  CT scan of abdomen pending.  CT of the abdomen shows evidence of cholelithiasis without any evidence of acute cholecystitis or any evidence of biliary obstruction.  Patient's symptoms some of them could be related to this particularly when she eats and gets the some discomfort in the upper part of the abdomen.  But certainly the cough and congestion is not related to that. X-ray is negative for pneumonia.  And patient without a leukocytosis and liver function test are fine.  While patient follow-up with general surgery have her do clear liquid diet and nonfatty food and if she develops pain that last for 3 hours to get reseen. Final Clinical Impression(s) / ED Diagnoses Final diagnoses:  Calculus of gallbladder without cholecystitis without obstruction    Rx / DC Orders ED Discharge Orders    None       Fredia Sorrow, MD 08/22/20 2316    Fredia Sorrow, MD 08/23/20 0009

## 2020-08-22 NOTE — ED Notes (Signed)
Patient transported to CT 

## 2020-08-22 NOTE — ED Provider Notes (Incomplete)
[facility] Provider Note   CSN: [id number] Arrival date & time: 08/22/20  2013     History Chief Complaint  Patient presents with  . Emesis    Destiny [name] is a 70 y.o. female.  Patient with cough and congestion that started yesterday.  And started with vomiting each time she eats.  And has had loose stools but patient does not consider it diarrhea.  Patient gets some abdominal discomfort when she eats.  Patient still has her gallbladder.  Patient's had all of the 3 Covid vaccines.  Had the booster about 6 weeks ago.        Past Medical History:  Diagnosis Date  . Abnormal blood chemistry   . Arthropathy   . Back pain   . Breast cyst   . Cellulitis   . Chest discomfort   . Constipation   . Disturbance of skin sensation   . Edema   . Encounter for screening and preventative care 06/17/2016  . GERD (gastroesophageal reflux disease)   . Hiatal hernia with gastroesophageal reflux 06/17/2016  . High risk medication use   . Hoarseness 06/15/2016  . Hyperglycemia   . Hyperglycemia 12/21/2016  . Hyperlipidemia   . Hyperlipidemia, mixed 06/15/2016  . Hypertension   . Hypopotassemia   . Hypothyroidism   . Joint pain   . Menopause   . Obesity   . Palpitations   . Positive ANA (antinuclear antibody)   . Shoulder pain, right   . Sleep apnea   . SOB (shortness of breath)   . Thyroid disease 12/11/2014  . Thyroid nodule   . Urinary incontinence 04/01/2017  . Vitamin D deficiency   . Vitamin D deficiency     Patient Active Problem List   Diagnosis Date Noted  . Family history of premature CAD 05/13/2018  . CAD (coronary artery disease) 05/13/2018  . Endometrium, hyperplasia 04/13/2018  . Vitamin B12 deficiency 04/12/2018  . Arthritis 04/12/2018  . Constipation 02/10/2018  . Abdominal pain 02/10/2018  . Atypical chest pain 02/10/2018  . Dyspepsia 02/10/2018  . Urinary incontinence 04/01/2017  . Morbid obesity ([place]) 01/25/2017  . Mixed  hyperlipidemia 01/07/2017  . Vitamin D deficiency 01/07/2017  . Hyperglycemia 12/21/2016  . Plantar fasciitis 09/02/2016  . Preventative health care 06/17/2016  . Hiatal hernia with gastroesophageal reflux 06/17/2016  . Hoarseness 06/15/2016  . DDD (degenerative disc disease), cervical 05/04/2016  . Foraminal stenosis of cervical region 05/04/2016  . Neck pain 05/04/2016  . Numbness and tingling in both hands 05/04/2016  . Essential hypertension 03/26/2016  . Hypothyroidism 12/11/2014    Past Surgical History:  Procedure Laterality Date  . BREAST CYST ASPIRATION Left    30 years ago  1970's or 1980's  . INTRAVASCULAR PRESSURE WIRE/FFR STUDY N/A 04/25/2018   Procedure: INTRAVASCULAR PRESSURE WIRE/FFR STUDY;  Surgeon: Jettie Booze, MD;  Location: [facility];  Service: Cardiovascular;  Laterality: N/A;  . LEFT HEART CATH AND CORONARY ANGIOGRAPHY N/A 04/25/2018   Procedure: LEFT HEART CATH AND CORONARY ANGIOGRAPHY;  Surgeon: Jettie Booze, MD;  Location: [facility];  Service: Cardiovascular;  Laterality: N/A;  . LIPOSUCTION  1990   over gluteal area   . WISDOM TOOTH EXTRACTION     and all upper teeth     OB History    Gravida  1   Para  0   Term      Preterm      AB  1  Living        SAB  1   IAB      Ectopic      Multiple      Live Births              Family History  Problem Relation Age of Onset  . Irregular heart beat Mother   . Heart disease Mother   . Rheumatic fever Mother   . Hypertension Father   . Ulcers Father   . Hyperlipidemia Father   . Stroke Maternal Grandmother   . Diabetes Maternal Grandmother   . Hypertension Paternal Grandmother   . Ulcers Paternal Grandfather     Social History   Tobacco Use  . Smoking status: Former Smoker    Packs/day: 1.00    Years: 27.00    Pack years: 27.00    Types: Cigarettes    Quit date: 2011    Years since quitting: 11.2  . Smokeless tobacco: Never Used  Vaping  Use  . Vaping Use: Never used  Substance Use Topics  . Alcohol use: Yes    Alcohol/week: 0.0 standard drinks  . Drug use: No    Home Medications Prior to Admission medications   Medication Sig Start Date End Date Taking? Authorizing Provider  thyroid (ARMOUR) 90 MG tablet Take 90 mg by mouth daily.   Yes [provider]  albuterol (VENTOLIN HFA) 108 (90 Base) MCG/ACT inhaler Inhale 2 puffs into the lungs every 6 (six) hours as needed for wheezing or shortness of breath. 11/01/18   Mosie Lukes, MD  aspirin EC 81 MG tablet Take 1 tablet (81 mg total) by mouth daily. 06/15/19   Mosie Lukes, MD  Cyanocobalamin (VITAMIN B-12) 500 MCG LOZG Take 500 mcg by mouth daily.     [provider]  Evolocumab (REPATHA SURECLICK) 119 MG/ML SOAJ Inject 1 Dose into the skin every 14 (fourteen) days. 01/10/20   Hilty, Nadean Corwin, MD  irbesartan (AVAPRO) 150 MG tablet Take 1 tablet (150 mg total) by mouth daily. 05/01/20   Mosie Lukes, MD  levothyroxine (SYNTHROID) 137 MCG tablet Take 1 tablet (137 mcg total) by mouth daily before breakfast. 01/17/20   Mosie Lukes, MD  liothyronine (CYTOMEL) 5 MCG tablet Take 1 tablet (5 mcg total) by mouth daily. 04/18/20   Mosie Lukes, MD  metoprolol succinate (TOPROL-XL) 25 MG 24 hr tablet Take 1 tablet (25 mg total) by mouth daily. 05/01/20   Mosie Lukes, MD  Misc. Devices (PULSE OXIMETER FOR FINGER) MISC 1 Dose by Does not apply route 3 (three) times daily as needed. 06/15/19   Mosie Lukes, MD  nitroGLYCERIN (NITROSTAT) 0.4 MG SL tablet Place 1 tablet (0.4 mg total) under the tongue every 5 (five) minutes as needed for chest pain. 10/23/19   Hilty, Nadean Corwin, MD  Probiotic Product (PROBIOTIC-10) CAPS Take 1 capsule by mouth daily.     [provider]  RED YEAST RICE EXTRACT PO Take 1 capsule by mouth daily. Reported on 07/16/2015    [provider]  Vitamin D, Ergocalciferol, (DRISDOL) 1.25 MG (50000 UNIT) CAPS capsule TAKE  ONE CAPSULE BY MOUTH EVERY SEVEN DAYS (MAY CONTAIN SOY OR PEANUT--TALK TO YOUR DOCTOR BEFORE TAKING IF YOU ARE ALLERGIC) 05/01/20   Mosie Lukes, MD  Wheat Dextrin (BENEFIBER DRINK MIX PO) Take 1 capsule by mouth daily.     [provider]  Zinc 100 MG TABS Take by mouth.  [provider]    Allergies    Crestor [rosuvastatin calcium], Diclofenac, Hydrochlorothiazide, and Statins  Review of Systems   Review of Systems  Constitutional: Negative for chills and fever.  HENT: Positive for congestion. Negative for rhinorrhea and sore throat.   Eyes: Negative for visual disturbance.  Respiratory: Positive for cough. Negative for shortness of breath.   Cardiovascular: Negative for chest pain and leg swelling.  Gastrointestinal: Positive for abdominal pain, diarrhea, nausea and vomiting.  Genitourinary: Negative for dysuria.  Musculoskeletal: Negative for back pain and neck pain.  Skin: Negative for rash.  Neurological: Negative for dizziness, light-headedness and headaches.  Hematological: Does not bruise/bleed easily.  Psychiatric/Behavioral: Negative for confusion.    Physical Exam Updated Vital Signs BP (!) 160/80   Pulse 87   Temp 98.1 F (36.7 C) (Oral)   Resp 18   Ht 1.6 m (5\' 3" )   Wt 99.8 kg   SpO2 97%   BMI 38.97 kg/m   Physical Exam Vitals and nursing note reviewed.  Constitutional:      General: She is not in acute distress.    Appearance: Normal appearance. She is well-developed.  HENT:     Head: Normocephalic and atraumatic.  Eyes:     Conjunctiva/sclera: Conjunctivae normal.     Pupils: Pupils are equal, round, and reactive to light.  Cardiovascular:     Rate and Rhythm: Normal rate and regular rhythm.     Heart sounds: No murmur heard.   Pulmonary:     Effort: Pulmonary effort is normal. No respiratory distress.     Breath sounds: Normal breath sounds.  Abdominal:     General: There is no distension.     Palpations: Abdomen is  soft.     Tenderness: There is no abdominal tenderness. There is no guarding.     Comments: Abdomen soft nontender.  No tenderness in the right upper quadrant at all.  Musculoskeletal:        General: Normal range of motion.     Cervical back: Neck supple.  Skin:    General: Skin is warm and dry.     Capillary Refill: Capillary refill takes less than 2 seconds.  Neurological:     General: No focal deficit present.     Mental Status: She is alert and oriented to person, place, and time.     Cranial Nerves: No cranial nerve deficit.     Sensory: No sensory deficit.     Motor: No weakness.     ED Results / Procedures / Treatments   Labs (all labs ordered are listed, but only abnormal results are displayed) Labs Reviewed  COMPREHENSIVE METABOLIC PANEL - Abnormal; Notable for the following components:      Result Value   Sodium 134 (*)    CO2 21 (*)    Glucose, Bld 146 (*)    All other components within normal limits  URINALYSIS, ROUTINE W REFLEX MICROSCOPIC - Abnormal; Notable for the following components:   Hgb urine dipstick TRACE (*)    All other components within normal limits  URINALYSIS, MICROSCOPIC (REFLEX) - Abnormal; Notable for the following components:   Bacteria, UA RARE (*)    All other components within normal limits  LIPASE, BLOOD  CBC    EKG None  Radiology CT Abdomen Pelvis W Contrast  Result Date: 08/22/2020 CLINICAL DATA:  Vomiting, abdominal pain, loose stools EXAM: CT ABDOMEN AND PELVIS WITH CONTRAST TECHNIQUE: Multidetector CT imaging of the abdomen and pelvis was  performed using the standard protocol following bolus administration of intravenous contrast. CONTRAST:  161mL OMNIPAQUE IOHEXOL 300 MG/ML  SOLN COMPARISON:  None. FINDINGS: Lower chest: No acute pleural or parenchymal lung disease. Hepatobiliary: Diffuse hepatic steatosis without focal liver abnormality. No biliary dilation. Calcified gallstones without cholecystitis. Pancreas: Unremarkable. No  pancreatic ductal dilatation or surrounding inflammatory changes. Spleen: Normal in size without focal abnormality. Adrenals/Urinary Tract: Adrenal glands are unremarkable. Kidneys are normal, without renal calculi, focal lesion, or hydronephrosis. Bladder is unremarkable. Stomach/Bowel: No bowel obstruction or ileus. Normal appendix right lower quadrant. No bowel wall thickening or inflammatory change. Vascular/Lymphatic: Aortic atherosclerosis. No enlarged abdominal or pelvic lymph nodes. Reproductive: Uterus and bilateral adnexa are unremarkable. Other: No free fluid or free gas.  No abdominal wall hernia. Musculoskeletal: No acute or destructive bony lesions. Reconstructed images demonstrate no additional findings. IMPRESSION: 1. Cholelithiasis without cholecystitis. 2. Hepatic steatosis. 3.  Aortic Atherosclerosis (ICD10-I70.0). Electronically Signed   By: Randa Ngo M.D.   On: 08/22/2020 23:11   DG Chest Port 1 View  Result Date: 08/22/2020 CLINICAL DATA:  Cough, congestion, allergy symptoms EXAM: PORTABLE CHEST 1 VIEW COMPARISON:  04/21/2018 FINDINGS: The heart size and mediastinal contours are within normal limits. Both lungs are clear. The visualized skeletal structures are unremarkable. IMPRESSION: No active disease. Electronically Signed   By: Randa Ngo M.D.   On: 08/22/2020 22:32    Procedures Procedures   Medications Ordered in ED Medications  0.9 %  sodium chloride infusion (0 mLs Intravenous Hold 08/22/20 2214)  sodium chloride 0.9 % bolus 1,000 mL (1,000 mLs Intravenous New Bag/Given 08/22/20 2214)  ondansetron (ZOFRAN) injection 4 mg (4 mg Intravenous Given 08/22/20 2214)  iohexol (OMNIPAQUE) 300 MG/ML solution 100 mL (100 mLs Intravenous Contrast Given 08/22/20 2304)    ED Course  I have reviewed the triage vital signs and the nursing notes.  Pertinent labs & imaging results that were available during my care of the patient were reviewed by me and considered in my medical  decision making (see chart for details).    MDM Rules/Calculators/A&P                         Work-up for the abdominal pain urinalysis negative for urinary tract infection.  Liver function test without any significant abnormalities.  Electrolytes sodium 134 CO2 21 glucose 146.  Renal functions normal. Normal anion gap no leukocytosis.  Hemoglobin is 14.8.  Lipase is normal at 27.  Chest x-ray without any acute findings.  CT scan of abdomen pending.  CT of the abdomen shows evidence of cholelithiasis without any evidence of acute cholecystitis or any evidence of biliary obstruction.  Patient's symptoms some of them could be related to this particularly when she eats and gets the some discomfort in the upper part of the abdomen.  But certainly the cough and congestion is not related to that. X-ray is negative for pneumonia.  And patient without a leukocytosis and liver function test are fine.  While patient follow-up with general surgery have her do clear liquid diet and nonfatty food and if she develops pain that last for 3 hours to get reseen. Final Clinical Impression(s) / ED Diagnoses Final diagnoses:  Calculus of gallbladder without cholecystitis without obstruction    Rx / DC Orders ED Discharge Orders    None       Fredia Sorrow, MD 08/22/20 2316

## 2020-08-23 ENCOUNTER — Telehealth: Payer: Self-pay

## 2020-08-23 NOTE — Telephone Encounter (Signed)
Nurse Assessment Nurse: Marina Gravel, RN, [facility] Date/Time Eilene Ghazi Time): 08/22/2020 7:19:21 PM Confirm and document reason for call. If symptomatic, describe symptoms. ---Caller says she starting out like she had allergies, she was sneezing a lot with no fever, still runny nose and congested, nauseous, loose stools, and vomiting and headache. pt says she vomits every time she eats. She cannot keep anything down. States this started yesterday. Does the patient have any new or worsening symptoms? ---Yes Will a triage be completed? ---Yes Related visit to physician within the last 2 weeks? ---No Does the PT have any chronic conditions? (i.e. diabetes, asthma, this includes High risk factors for pregnancy, etc.) ---Yes List chronic conditions. ---HTN hypothyroidism hypercholesterolemia Gallstone Is this a behavioral health or substance abuse call? ---No Guidelines Guideline Title Affirmed Question Affirmed Notes Nurse Date/Time ([place] Time) COVID-19 - Diagnosed or Suspected Patient sounds very sick or weak to the triager Marina Gravel, RN, [facility] 08/22/2020 7:22:53 PM Disp. Time Eilene Ghazi Time) Disposition Final User 08/22/2020 7:30:30 PM Go to ED Now (or PCP triage) Yes Marina Gravel, RN, [facility] Bake PLEASE NOTE: All timestamps contained within this report are represented as [place] Standard Time. CONFIDENTIALTY NOTICE: This fax transmission is intended only for the addressee. It contains information that is legally privileged, confidential or otherwise protected from use or disclosure. If you are not the intended recipient, you are strictly prohibited from reviewing, disclosing, copying using or disseminating any of this information or taking any action in reliance on or regarding this information. If you have received this fax in error, please notify us immediately by telephone so that we can arrange for its return to [place]. Phone: [phone], Toll-Free: [phone], Fax: [phone] Page: 2 of 2 Call Id:  [id number] [place] Disagree/Comply Comply Caller Understands Yes PreDisposition Go to ED Care Advice Given Per Guideline GO TO ED NOW (OR PCP TRIAGE): GENERAL CARE ADVICE FOR COVID-19 SYMPTOMS: CARE ADVICE given per COVID-19 - DIAGNOSED OR SUSPECTED (Adult) guideline. Referrals GO TO FACILITY UNDECIDED   Pt seen at ED.

## 2020-08-27 ENCOUNTER — Telehealth: Payer: Self-pay | Admitting: Family Medicine

## 2020-08-27 NOTE — Telephone Encounter (Signed)
Spoke with she states that she does not want to do an ER follow-up visit because she will be scheduling an appointment with General Surgery due to her gall stones. See er report in chart.

## 2020-08-27 NOTE — Telephone Encounter (Signed)
Patient would like to speak to Dr. [name] cma in regards to hospital visit. Patient refuse appt

## 2020-08-29 ENCOUNTER — Ambulatory Visit: Payer: Self-pay | Admitting: Surgery

## 2020-08-29 DIAGNOSIS — K8050 Calculus of bile duct without cholangitis or cholecystitis without obstruction: Secondary | ICD-10-CM | POA: Diagnosis not present

## 2020-08-29 NOTE — H&P (Signed)
Surgical Evaluation  Chief Complaint: abdominal pain  HPI: this is a 70 year old woman who presented to the emergency room with abdominal pain, nausea, vomiting, loose stools. And had a CT scan showing cholelithiasis without cholecystitis, and hepatic steatosis this was last week. CMP and CBC were normal. She states that for quite some time she has been doing healthy diet plan by someone called the field dry, but when she went off this diet and resumed eating some greasy, fatty foods which was right before her symptoms began, she developed this pain and nausea and vomiting. Since she has gone back on the diet, as well as has been resting recovering from a viral upper respiratory infection, her symptoms have abated. She is currently pain-free.    Allergies  Allergen Reactions  . Crestor [Rosuvastatin Calcium] Other (See Comments)    myalgias  . Diclofenac Itching, Nausea Only and Other (See Comments)    Wheezing  . Hydrochlorothiazide     Wheezing. Losing teeth.  . Statins     Past Medical History:  Diagnosis Date  . Abnormal blood chemistry   . Arthropathy   . Back pain   . Breast cyst   . Cellulitis   . Chest discomfort   . Constipation   . Disturbance of skin sensation   . Edema   . Encounter for screening and preventative care 06/17/2016  . GERD (gastroesophageal reflux disease)   . Hiatal hernia with gastroesophageal reflux 06/17/2016  . High risk medication use   . Hoarseness 06/15/2016  . Hyperglycemia   . Hyperglycemia 12/21/2016  . Hyperlipidemia   . Hyperlipidemia, mixed 06/15/2016  . Hypertension   . Hypopotassemia   . Hypothyroidism   . Joint pain   . Menopause   . Obesity   . Palpitations   . Positive ANA (antinuclear antibody)   . Shoulder pain, right   . Sleep apnea   . SOB (shortness of breath)   . Thyroid disease 12/11/2014  . Thyroid nodule   . Urinary incontinence 04/01/2017  . Vitamin D deficiency   . Vitamin D deficiency     Past Surgical  History:  Procedure Laterality Date  . BREAST CYST ASPIRATION Left    30 years ago  1970's or 1980's  . INTRAVASCULAR PRESSURE WIRE/FFR STUDY N/A 04/25/2018   Procedure: INTRAVASCULAR PRESSURE WIRE/FFR STUDY;  Surgeon: Jettie Booze, MD;  Location: [facility];  Service: Cardiovascular;  Laterality: N/A;  . LEFT HEART CATH AND CORONARY ANGIOGRAPHY N/A 04/25/2018   Procedure: LEFT HEART CATH AND CORONARY ANGIOGRAPHY;  Surgeon: Jettie Booze, MD;  Location: [facility];  Service: Cardiovascular;  Laterality: N/A;  . LIPOSUCTION  1990   over gluteal area   . WISDOM TOOTH EXTRACTION     and all upper teeth    Family History  Problem Relation Age of Onset  . Irregular heart beat Mother   . Heart disease Mother   . Rheumatic fever Mother   . Hypertension Father   . Ulcers Father   . Hyperlipidemia Father   . Stroke Maternal Grandmother   . Diabetes Maternal Grandmother   . Hypertension Paternal Grandmother   . Ulcers Paternal Grandfather     Social History   Socioeconomic History  . Marital status: Married    Spouse name: Quanna Wittke  . Number of children: Not on file  . Years of education: Not on file  . Highest education level: Not on file  Occupational History  . Occupation:  homemaker  Tobacco Use  . Smoking status: Former Smoker    Packs/day: 1.00    Years: 27.00    Pack years: 27.00    Types: Cigarettes    Quit date: 2011    Years since quitting: 11.2  . Smokeless tobacco: Never Used  Vaping Use  . Vaping Use: Never used  Substance and Sexual Activity  . Alcohol use: Yes    Alcohol/week: 0.0 standard drinks  . Drug use: No  . Sexual activity: Not Currently    Birth control/protection: Post-menopausal    Comment: menopause around age [age]  Other Topics Concern  . Not on file  Social History Narrative   Occasional alcohol use: hard liquor, wine and beer   Social Determinants of Health   Financial Resource Strain: Not on file   Food Insecurity: Not on file  Transportation Needs: Not on file  Physical Activity: Not on file  Stress: Not on file  Social Connections: Not on file    Current Outpatient Medications on File Prior to Visit  Medication Sig Dispense Refill  . albuterol (VENTOLIN HFA) 108 (90 Base) MCG/ACT inhaler Inhale 2 puffs into the lungs every 6 (six) hours as needed for wheezing or shortness of breath. 18 g 5  . aspirin EC 81 MG tablet Take 1 tablet (81 mg total) by mouth daily. 90 tablet 3  . Cyanocobalamin (VITAMIN B-12) 500 MCG LOZG Take 500 mcg by mouth daily.     . Evolocumab (REPATHA SURECLICK) 161 MG/ML SOAJ Inject 1 Dose into the skin every 14 (fourteen) days. 6 pen 3  . irbesartan (AVAPRO) 150 MG tablet Take 1 tablet (150 mg total) by mouth daily. 90 tablet 1  . levothyroxine (SYNTHROID) 137 MCG tablet Take 1 tablet (137 mcg total) by mouth daily before breakfast. 90 tablet 1  . liothyronine (CYTOMEL) 5 MCG tablet Take 1 tablet (5 mcg total) by mouth daily.    . metoprolol succinate (TOPROL-XL) 25 MG 24 hr tablet Take 1 tablet (25 mg total) by mouth daily. 90 tablet 1  . Misc. Devices (PULSE OXIMETER FOR FINGER) MISC 1 Dose by Does not apply route 3 (three) times daily as needed. 1 each 0  . nitroGLYCERIN (NITROSTAT) 0.4 MG SL tablet Place 1 tablet (0.4 mg total) under the tongue every 5 (five) minutes as needed for chest pain. 25 tablet 3  . ondansetron (ZOFRAN ODT) 4 MG disintegrating tablet Take 1 tablet (4 mg total) by mouth every 8 (eight) hours as needed. 10 tablet 1  . Probiotic Product (PROBIOTIC-10) CAPS Take 1 capsule by mouth daily.     . RED YEAST RICE EXTRACT PO Take 1 capsule by mouth daily. Reported on 07/16/2015    . thyroid (ARMOUR) 90 MG tablet Take 90 mg by mouth daily.    . Vitamin D, Ergocalciferol, (DRISDOL) 1.25 MG (50000 UNIT) CAPS capsule TAKE ONE CAPSULE BY MOUTH EVERY SEVEN DAYS (MAY CONTAIN SOY OR PEANUT--TALK TO YOUR DOCTOR BEFORE TAKING IF YOU ARE ALLERGIC) 12  capsule 3  . Wheat Dextrin (BENEFIBER DRINK MIX PO) Take 1 capsule by mouth daily.     . Zinc 100 MG TABS Take by mouth.     No current facility-administered medications on file prior to visit.    Review of Systems General Not Present- Appetite Loss, Chills, Fatigue, Fever, Night Sweats, Weight Gain and Weight Loss. Skin Present- Non-Healing Wounds. Not Present- Change in Wart/Mole, Dryness, Hives, Jaundice, New Lesions, Rash and Ulcer. HEENT Present- Seasonal Allergies, Sinus  Pain and Wears glasses/contact lenses. Not Present- Earache, Hearing Loss, Hoarseness, Nose Bleed, Oral Ulcers, Ringing in the Ears, Sore Throat, Visual Disturbances and Yellow Eyes. Respiratory Present- Snoring and Wheezing. Not Present- Bloody sputum, Chronic Cough and Difficulty Breathing. Breast Not Present- Breast Mass, Breast Pain, Nipple Discharge and Skin Changes. Cardiovascular Not Present- Chest Pain, Difficulty Breathing Lying Down, Leg Cramps, Palpitations, Rapid Heart Rate, Shortness of Breath and Swelling of Extremities. Gastrointestinal Present- Hemorrhoids and Indigestion. Not Present- Abdominal Pain, Bloating, Bloody Stool, Change in Bowel Habits, Chronic diarrhea, Constipation, Difficulty Swallowing, Excessive gas, Gets full quickly at meals, Nausea, Rectal Pain and Vomiting. Female Genitourinary Present- Frequency and Urgency. Not Present- Nocturia, Painful Urination and Pelvic Pain. Musculoskeletal Present- Back Pain and Joint Pain. Not Present- Joint Stiffness, Muscle Pain, Muscle Weakness and Swelling of Extremities. Psychiatric Not Present- Anxiety, Bipolar, Change in Sleep Pattern, Depression, Fearful and Frequent crying. Endocrine Not Present- Cold Intolerance, Excessive Hunger, Hair Changes, Heat Intolerance, Hot flashes and New Diabetes. Hematology Not Present- Blood Thinners, Easy Bruising, Excessive bleeding, Gland problems, HIV and Persistent Infections.  Vitals  Weight: 223.25 lb Height:  63in Body Surface Area: 2.03 m Body Mass Index: 39.55 kg/m  Temp.: 97.22F  Pulse: 78 (Regular)  P.OX: 96% (Room air) BP: 140/80(Sitting, Left Arm, Standard)  Gen: alert and well appearing Eye: extraocular motion intact, no scleral icterus ENT: moist mucus membranes, dentition intact Neck: no mass or thyromegaly Chest: unlabored respirations, symmetrical air entry, clear bilaterally CV: regular rate and rhythm, no pedal edema Abdomen: soft, nontender, nondistended. No mass or organomegaly MSK: strength symmetrical throughout, no deformity Neuro: grossly intact, normal gait Psych: normal mood and affect, appropriate insight Skin: warm and dry, no rash or lesion on limited exam   CBC Latest Ref Rng & Units 08/22/2020 04/18/2020 10/06/2019  WBC 4.0 - 10.5 K/uL 9.0 4.6 4.3  Hemoglobin 12.0 - 15.0 g/dL 14.8 14.5 13.9  Hematocrit 36.0 - 46.0 % 43.7 43.1 41.8  Platelets 150 - 400 K/uL 235 272.0 261.0    CMP Latest Ref Rng & Units 08/22/2020 04/18/2020 10/06/2019  Glucose 70 - 99 mg/dL 146(H) 95 106(H)  BUN 8 - 23 mg/dL 11 17 17   Creatinine 0.44 - 1.00 mg/dL 0.65 0.84 0.81  Sodium 135 - 145 mmol/L 134(L) 137 137  Potassium 3.5 - 5.1 mmol/L 3.9 4.8 4.4  Chloride 98 - 111 mmol/L 100 100 100  CO2 22 - 32 mmol/L 21(L) 30 31  Calcium 8.9 - 10.3 mg/dL 9.7 9.9 9.6  Total Protein 6.5 - 8.1 g/dL 8.1 7.4 6.9  Total Bilirubin 0.3 - 1.2 mg/dL 0.7 0.4 0.7  Alkaline Phos 38 - 126 U/L 66 64 53  AST 15 - 41 U/L 28 17 16   ALT 0 - 44 U/L 34 24 21    No results found for: INR, PROTIME  Imaging: No results found.   A/P: BILIARY COLIC (T51.76) Story: vs chronic cholecystitis. I recommend proceeding with laparoscopic cholecystectomy vs robotic with possible cholangiogram. Discussed relevant anatomy, technique andrisks of surgery including bleeding, pain, scarring, intraabdominal injury specifically to the common bile duct and sequelae, bile leak, conversion to open surgery, blood clot,  pneumonia, heart attack, stroke, failure to resolve symptoms, etc. Questions welcomed and answered. She wishes to proceed with surgery.    Patient Active Problem List   Diagnosis Date Noted  . Family history of premature CAD 05/13/2018  . CAD (coronary artery disease) 05/13/2018  . Endometrium, hyperplasia 04/13/2018  . Vitamin B12 deficiency 04/12/2018  .  Arthritis 04/12/2018  . Constipation 02/10/2018  . Abdominal pain 02/10/2018  . Atypical chest pain 02/10/2018  . Dyspepsia 02/10/2018  . Urinary incontinence 04/01/2017  . Morbid obesity ([place]) 01/25/2017  . Mixed hyperlipidemia 01/07/2017  . Vitamin D deficiency 01/07/2017  . Hyperglycemia 12/21/2016  . Plantar fasciitis 09/02/2016  . Preventative health care 06/17/2016  . Hiatal hernia with gastroesophageal reflux 06/17/2016  . Hoarseness 06/15/2016  . DDD (degenerative disc disease), cervical 05/04/2016  . Foraminal stenosis of cervical region 05/04/2016  . Neck pain 05/04/2016  . Numbness and tingling in both hands 05/04/2016  . Essential hypertension 03/26/2016  . Hypothyroidism 12/11/2014       Romana Juniper, MD [facility]  See AMION to contact appropriate on-call provider

## 2020-08-29 NOTE — H&P (View-Only) (Signed)
Surgical Evaluation  Chief Complaint: abdominal pain  HPI: this is a 70 year old woman who presented to the emergency room with abdominal pain, nausea, vomiting, loose stools. And had a CT scan showing cholelithiasis without cholecystitis, and hepatic steatosis this was last week. CMP and CBC were normal. She states that for quite some time she has been doing healthy diet plan by someone called the field dry, but when she went off this diet and resumed eating some greasy, fatty foods which was right before her symptoms began, she developed this pain and nausea and vomiting. Since she has gone back on the diet, as well as has been resting recovering from a viral upper respiratory infection, her symptoms have abated. She is currently pain-free.    Allergies  Allergen Reactions  . Crestor [Rosuvastatin Calcium] Other (See Comments)    myalgias  . Diclofenac Itching, Nausea Only and Other (See Comments)    Wheezing  . Hydrochlorothiazide     Wheezing. Losing teeth.  . Statins     Past Medical History:  Diagnosis Date  . Abnormal blood chemistry   . Arthropathy   . Back pain   . Breast cyst   . Cellulitis   . Chest discomfort   . Constipation   . Disturbance of skin sensation   . Edema   . Encounter for screening and preventative care 06/17/2016  . GERD (gastroesophageal reflux disease)   . Hiatal hernia with gastroesophageal reflux 06/17/2016  . High risk medication use   . Hoarseness 06/15/2016  . Hyperglycemia   . Hyperglycemia 12/21/2016  . Hyperlipidemia   . Hyperlipidemia, mixed 06/15/2016  . Hypertension   . Hypopotassemia   . Hypothyroidism   . Joint pain   . Menopause   . Obesity   . Palpitations   . Positive ANA (antinuclear antibody)   . Shoulder pain, right   . Sleep apnea   . SOB (shortness of breath)   . Thyroid disease 12/11/2014  . Thyroid nodule   . Urinary incontinence 04/01/2017  . Vitamin D deficiency   . Vitamin D deficiency     Past Surgical  History:  Procedure Laterality Date  . BREAST CYST ASPIRATION Left    30 years ago  1970's or 1980's  . INTRAVASCULAR PRESSURE WIRE/FFR STUDY N/A 04/25/2018   Procedure: INTRAVASCULAR PRESSURE WIRE/FFR STUDY;  Surgeon: Jettie Booze, MD;  Location: [facility];  Service: Cardiovascular;  Laterality: N/A;  . LEFT HEART CATH AND CORONARY ANGIOGRAPHY N/A 04/25/2018   Procedure: LEFT HEART CATH AND CORONARY ANGIOGRAPHY;  Surgeon: Jettie Booze, MD;  Location: [facility];  Service: Cardiovascular;  Laterality: N/A;  . LIPOSUCTION  1990   over gluteal area   . WISDOM TOOTH EXTRACTION     and all upper teeth    Family History  Problem Relation Age of Onset  . Irregular heart beat Mother   . Heart disease Mother   . Rheumatic fever Mother   . Hypertension Father   . Ulcers Father   . Hyperlipidemia Father   . Stroke Maternal Grandmother   . Diabetes Maternal Grandmother   . Hypertension Paternal Grandmother   . Ulcers Paternal Grandfather     Social History   Socioeconomic History  . Marital status: Married    Spouse name: Josepha Barbier  . Number of children: Not on file  . Years of education: Not on file  . Highest education level: Not on file  Occupational History  . Occupation:  homemaker  Tobacco Use  . Smoking status: Former Smoker    Packs/day: 1.00    Years: 27.00    Pack years: 27.00    Types: Cigarettes    Quit date: 2011    Years since quitting: 11.2  . Smokeless tobacco: Never Used  Vaping Use  . Vaping Use: Never used  Substance and Sexual Activity  . Alcohol use: Yes    Alcohol/week: 0.0 standard drinks  . Drug use: No  . Sexual activity: Not Currently    Birth control/protection: Post-menopausal    Comment: menopause around age [age]  Other Topics Concern  . Not on file  Social History Narrative   Occasional alcohol use: hard liquor, wine and beer   Social Determinants of Health   Financial Resource Strain: Not on file   Food Insecurity: Not on file  Transportation Needs: Not on file  Physical Activity: Not on file  Stress: Not on file  Social Connections: Not on file    Current Outpatient Medications on File Prior to Visit  Medication Sig Dispense Refill  . albuterol (VENTOLIN HFA) 108 (90 Base) MCG/ACT inhaler Inhale 2 puffs into the lungs every 6 (six) hours as needed for wheezing or shortness of breath. 18 g 5  . aspirin EC 81 MG tablet Take 1 tablet (81 mg total) by mouth daily. 90 tablet 3  . Cyanocobalamin (VITAMIN B-12) 500 MCG LOZG Take 500 mcg by mouth daily.     . Evolocumab (REPATHA SURECLICK) 491 MG/ML SOAJ Inject 1 Dose into the skin every 14 (fourteen) days. 6 pen 3  . irbesartan (AVAPRO) 150 MG tablet Take 1 tablet (150 mg total) by mouth daily. 90 tablet 1  . levothyroxine (SYNTHROID) 137 MCG tablet Take 1 tablet (137 mcg total) by mouth daily before breakfast. 90 tablet 1  . liothyronine (CYTOMEL) 5 MCG tablet Take 1 tablet (5 mcg total) by mouth daily.    . metoprolol succinate (TOPROL-XL) 25 MG 24 hr tablet Take 1 tablet (25 mg total) by mouth daily. 90 tablet 1  . Misc. Devices (PULSE OXIMETER FOR FINGER) MISC 1 Dose by Does not apply route 3 (three) times daily as needed. 1 each 0  . nitroGLYCERIN (NITROSTAT) 0.4 MG SL tablet Place 1 tablet (0.4 mg total) under the tongue every 5 (five) minutes as needed for chest pain. 25 tablet 3  . ondansetron (ZOFRAN ODT) 4 MG disintegrating tablet Take 1 tablet (4 mg total) by mouth every 8 (eight) hours as needed. 10 tablet 1  . Probiotic Product (PROBIOTIC-10) CAPS Take 1 capsule by mouth daily.     . RED YEAST RICE EXTRACT PO Take 1 capsule by mouth daily. Reported on 07/16/2015    . thyroid (ARMOUR) 90 MG tablet Take 90 mg by mouth daily.    . Vitamin D, Ergocalciferol, (DRISDOL) 1.25 MG (50000 UNIT) CAPS capsule TAKE ONE CAPSULE BY MOUTH EVERY SEVEN DAYS (MAY CONTAIN SOY OR PEANUT--TALK TO YOUR DOCTOR BEFORE TAKING IF YOU ARE ALLERGIC) 12  capsule 3  . Wheat Dextrin (BENEFIBER DRINK MIX PO) Take 1 capsule by mouth daily.     . Zinc 100 MG TABS Take by mouth.     No current facility-administered medications on file prior to visit.    Review of Systems General Not Present- Appetite Loss, Chills, Fatigue, Fever, Night Sweats, Weight Gain and Weight Loss. Skin Present- Non-Healing Wounds. Not Present- Change in Wart/Mole, Dryness, Hives, Jaundice, New Lesions, Rash and Ulcer. HEENT Present- Seasonal Allergies, Sinus  Pain and Wears glasses/contact lenses. Not Present- Earache, Hearing Loss, Hoarseness, Nose Bleed, Oral Ulcers, Ringing in the Ears, Sore Throat, Visual Disturbances and Yellow Eyes. Respiratory Present- Snoring and Wheezing. Not Present- Bloody sputum, Chronic Cough and Difficulty Breathing. Breast Not Present- Breast Mass, Breast Pain, Nipple Discharge and Skin Changes. Cardiovascular Not Present- Chest Pain, Difficulty Breathing Lying Down, Leg Cramps, Palpitations, Rapid Heart Rate, Shortness of Breath and Swelling of Extremities. Gastrointestinal Present- Hemorrhoids and Indigestion. Not Present- Abdominal Pain, Bloating, Bloody Stool, Change in Bowel Habits, Chronic diarrhea, Constipation, Difficulty Swallowing, Excessive gas, Gets full quickly at meals, Nausea, Rectal Pain and Vomiting. Female Genitourinary Present- Frequency and Urgency. Not Present- Nocturia, Painful Urination and Pelvic Pain. Musculoskeletal Present- Back Pain and Joint Pain. Not Present- Joint Stiffness, Muscle Pain, Muscle Weakness and Swelling of Extremities. Psychiatric Not Present- Anxiety, Bipolar, Change in Sleep Pattern, Depression, Fearful and Frequent crying. Endocrine Not Present- Cold Intolerance, Excessive Hunger, Hair Changes, Heat Intolerance, Hot flashes and New Diabetes. Hematology Not Present- Blood Thinners, Easy Bruising, Excessive bleeding, Gland problems, HIV and Persistent Infections.  Vitals  Weight: 223.25 lb Height:  63in Body Surface Area: 2.03 m Body Mass Index: 39.55 kg/m  Temp.: 97.63F  Pulse: 78 (Regular)  P.OX: 96% (Room air) BP: 140/80(Sitting, Left Arm, Standard)  Gen: alert and well appearing Eye: extraocular motion intact, no scleral icterus ENT: moist mucus membranes, dentition intact Neck: no mass or thyromegaly Chest: unlabored respirations, symmetrical air entry, clear bilaterally CV: regular rate and rhythm, no pedal edema Abdomen: soft, nontender, nondistended. No mass or organomegaly MSK: strength symmetrical throughout, no deformity Neuro: grossly intact, normal gait Psych: normal mood and affect, appropriate insight Skin: warm and dry, no rash or lesion on limited exam   CBC Latest Ref Rng & Units 08/22/2020 04/18/2020 10/06/2019  WBC 4.0 - 10.5 K/uL 9.0 4.6 4.3  Hemoglobin 12.0 - 15.0 g/dL 14.8 14.5 13.9  Hematocrit 36.0 - 46.0 % 43.7 43.1 41.8  Platelets 150 - 400 K/uL 235 272.0 261.0    CMP Latest Ref Rng & Units 08/22/2020 04/18/2020 10/06/2019  Glucose 70 - 99 mg/dL 146(H) 95 106(H)  BUN 8 - 23 mg/dL 11 17 17   Creatinine 0.44 - 1.00 mg/dL 0.65 0.84 0.81  Sodium 135 - 145 mmol/L 134(L) 137 137  Potassium 3.5 - 5.1 mmol/L 3.9 4.8 4.4  Chloride 98 - 111 mmol/L 100 100 100  CO2 22 - 32 mmol/L 21(L) 30 31  Calcium 8.9 - 10.3 mg/dL 9.7 9.9 9.6  Total Protein 6.5 - 8.1 g/dL 8.1 7.4 6.9  Total Bilirubin 0.3 - 1.2 mg/dL 0.7 0.4 0.7  Alkaline Phos 38 - 126 U/L 66 64 53  AST 15 - 41 U/L 28 17 16   ALT 0 - 44 U/L 34 24 21    No results found for: INR, PROTIME  Imaging: No results found.   A/P: BILIARY COLIC (O32.91) Story: vs chronic cholecystitis. I recommend proceeding with laparoscopic cholecystectomy vs robotic with possible cholangiogram. Discussed relevant anatomy, technique andrisks of surgery including bleeding, pain, scarring, intraabdominal injury specifically to the common bile duct and sequelae, bile leak, conversion to open surgery, blood clot,  pneumonia, heart attack, stroke, failure to resolve symptoms, etc. Questions welcomed and answered. She wishes to proceed with surgery.    Patient Active Problem List   Diagnosis Date Noted  . Family history of premature CAD 05/13/2018  . CAD (coronary artery disease) 05/13/2018  . Endometrium, hyperplasia 04/13/2018  . Vitamin B12 deficiency 04/12/2018  .  Arthritis 04/12/2018  . Constipation 02/10/2018  . Abdominal pain 02/10/2018  . Atypical chest pain 02/10/2018  . Dyspepsia 02/10/2018  . Urinary incontinence 04/01/2017  . Morbid obesity ([place]) 01/25/2017  . Mixed hyperlipidemia 01/07/2017  . Vitamin D deficiency 01/07/2017  . Hyperglycemia 12/21/2016  . Plantar fasciitis 09/02/2016  . Preventative health care 06/17/2016  . Hiatal hernia with gastroesophageal reflux 06/17/2016  . Hoarseness 06/15/2016  . DDD (degenerative disc disease), cervical 05/04/2016  . Foraminal stenosis of cervical region 05/04/2016  . Neck pain 05/04/2016  . Numbness and tingling in both hands 05/04/2016  . Essential hypertension 03/26/2016  . Hypothyroidism 12/11/2014       Romana Juniper, MD [facility]  See AMION to contact appropriate on-call provider

## 2020-08-30 ENCOUNTER — Encounter (HOSPITAL_COMMUNITY): Payer: Self-pay | Admitting: Surgery

## 2020-08-30 ENCOUNTER — Other Ambulatory Visit: Payer: Self-pay

## 2020-08-30 NOTE — Progress Notes (Signed)
COVID Vaccine Completed: Yes Date COVID Vaccine completed: 11/01/19 Has received booster: Yes COVID vaccine manufacturer: Pfizer     Date of COVID positive in last 90 days: No  PCP - Mosie Lukes, MD Cardiologist - Dr. Alma Friendly last office visit note 10/23/19  Chest x-ray - 08/22/20 in epic EKG - greater than 1 year Stress Test - greater than 2 year ECHO - N/A Cardiac Cath - greater than 2 years ago Pacemaker/ICD device last checked: N/A  Sleep Study - N/A CPAP - N/A  Fasting Blood Sugar - N/A Checks Blood Sugar __N/A___ times a day  Blood Thinner Instructions: N/A Aspirin Instructions: N/A Last Dose: N/A  Activity level:  Can go up a flight of stairs and activities of daily living without stopping and without symptoms       Anesthesia review: N/A  Patient denies shortness of breath, fever, cough and chest pain at PAT appointment   Patient verbalized understanding of instructions that were given to them at the PAT appointment. Patient was also instructed that they will need to review over the PAT instructions again at home before surgery.

## 2020-08-31 ENCOUNTER — Other Ambulatory Visit (HOSPITAL_COMMUNITY)
Admission: RE | Admit: 2020-08-31 | Discharge: 2020-08-31 | Disposition: A | Discharge status: Home / Self Care | Payer: Medicare Other | Source: Ambulatory Visit | Attending: Surgery | Admitting: Surgery

## 2020-08-31 DIAGNOSIS — Z01812 Encounter for preprocedural laboratory examination: Secondary | ICD-10-CM | POA: Insufficient documentation

## 2020-08-31 DIAGNOSIS — Z20822 Contact with and (suspected) exposure to covid-19: Secondary | ICD-10-CM | POA: Insufficient documentation

## 2020-08-31 LAB — SARS CORONAVIRUS 2 (TAT 6-24 HRS): SARS Coronavirus 2: NEGATIVE

## 2020-09-03 ENCOUNTER — Telehealth: Payer: Self-pay | Admitting: Internal Medicine

## 2020-09-03 MED ORDER — BUPIVACAINE LIPOSOME 1.3 % IJ SUSP
20.0000 mL | Freq: Once | INTRAMUSCULAR | Status: DC
  Filled 2020-09-03: qty 20

## 2020-09-03 NOTE — Telephone Encounter (Signed)
   Primary Cardiologist: Jenean Lindau, MD  Chart reviewed as part of pre-operative protocol coverage. Patient was contacted 09/03/2020 in reference to pre-operative risk assessment for pending surgery as outlined below.  Destiny [name] was last seen on 04/21/2018  by Dr. Lennox Pippins.    Due to the duration since last seen by her cardiologist in 2019, Destiny [name] will require a follow-up visit for further pre-operative risk assessment. I cannot conduct a full cardiac assessment with one day timeframe prior to procedure.   Pre-op covering staff: - Please schedule appointment and call patient to inform them. If patient already had an upcoming appointment within acceptable timeframe, please add "pre-op clearance" to the appointment notes so provider is aware. - Please contact requesting surgeon's office via preferred method (i.e, phone, fax) to inform them of need for appointment prior to surgery.  Kathyrn Drown, NP 09/03/2020, 4:56 PM

## 2020-09-03 NOTE — Telephone Encounter (Signed)
Spoke with patient and she says that she does not see Dr Geraldo Pitter anymore and she does not wish to see him. She assumed that Dr Debara Pickett was her cardiologist, if this is not the case, she would like to establish with another provider. I informed her that per her chart, it appears that she only sees Dr Debara Pickett for lipid clinic visits. She says that she needs to know something today as she is supposed to go in for the procedure at 4 AM tomorrow morning.

## 2020-09-03 NOTE — Progress Notes (Signed)
On-call physician paged by patient around 7:45PM, stating she had been contacted by someone closer to 5 who had informed her that her surgery tomorrow morning may be canceled because she has not seen her cardiologist. They were to call her back but no one did.    She saw Dr. Geraldo Pitter last and had a cath in November 2019 confirming moderate CAD (25% LAD stenosis, Proximal circumflex 50% stenosis, ost 1st diag lesion with 75% stenosis) with normal cardiac function. She sees Dr. Debara Pickett (last year most recently) for lipid clinic and her cholesterol has been well controlled. I did refer her back to Dr. 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   I have discussed with her that based on her history her risk of peri-operative cardiac event is higher than average (6% based on RCRI) and she understands this well. I let her know I am willing to proceed with her lap chole tomorrow. I did inform her that if anyone else on the team has any reservations that we may still need to postpone until she can resume follow up with a cardiologist, and she understands.

## 2020-09-03 NOTE — Telephone Encounter (Signed)
   [facility] Pre-operative Risk Assessment    HEARTCARE STAFF: - Please ensure there is not already an duplicate clearance open for this procedure. - Under Visit Info/Reason for Call, type in Other and utilize the format Clearance MM/DD/YY or Clearance TBD. Do not use dashes or single digits. - If request is for dental extraction, please clarify the # of teeth to be extracted.  Request for surgical clearance:  1. What type of surgery is being performed? Laprascopic Cholecystectomy   2. When is this surgery scheduled? Clearance form says TBD but per Epic it's scheduled for tomorrow   3. What type of clearance is required (medical clearance vs. Pharmacy clearance to hold med vs. Both)?  Medical  4. Are there any medications that need to be held prior to surgery and how long? None mentioned   5. Practice name and name of physician performing surgery? [facility]   6. What is the office phone number? [phone]   7.   What is the office fax number? [place], CMA  8.   Anesthesia type (None, local, MAC, general) ? general _________________________________________________________________   (provider comments below)

## 2020-09-04 ENCOUNTER — Ambulatory Visit (HOSPITAL_COMMUNITY)
Admission: RE | Admit: 2020-09-04 | Discharge: 2020-09-04 | Disposition: A | Discharge status: Home / Self Care | Payer: Medicare Other | Attending: Surgery | Admitting: Surgery

## 2020-09-04 ENCOUNTER — Ambulatory Visit (HOSPITAL_COMMUNITY): Payer: Medicare Other | Admitting: Certified Registered Nurse Anesthetist

## 2020-09-04 ENCOUNTER — Encounter (HOSPITAL_COMMUNITY)
Admission: RE | Disposition: A | Discharge status: Home / Self Care | Payer: Self-pay | Source: Home / Self Care | Attending: Surgery

## 2020-09-04 ENCOUNTER — Encounter (HOSPITAL_COMMUNITY): Payer: Self-pay | Admitting: Surgery

## 2020-09-04 DIAGNOSIS — Z87891 Personal history of nicotine dependence: Secondary | ICD-10-CM | POA: Diagnosis not present

## 2020-09-04 DIAGNOSIS — Z79890 Hormone replacement therapy (postmenopausal): Secondary | ICD-10-CM | POA: Diagnosis not present

## 2020-09-04 DIAGNOSIS — I2510 Atherosclerotic heart disease of native coronary artery without angina pectoris: Secondary | ICD-10-CM | POA: Insufficient documentation

## 2020-09-04 DIAGNOSIS — K819 Cholecystitis, unspecified: Secondary | ICD-10-CM | POA: Diagnosis not present

## 2020-09-04 DIAGNOSIS — Z888 Allergy status to other drugs, medicaments and biological substances status: Secondary | ICD-10-CM | POA: Diagnosis not present

## 2020-09-04 DIAGNOSIS — I10 Essential (primary) hypertension: Secondary | ICD-10-CM | POA: Diagnosis not present

## 2020-09-04 DIAGNOSIS — K8010 Calculus of gallbladder with chronic cholecystitis without obstruction: Secondary | ICD-10-CM | POA: Diagnosis not present

## 2020-09-04 DIAGNOSIS — Z79899 Other long term (current) drug therapy: Secondary | ICD-10-CM | POA: Insufficient documentation

## 2020-09-04 DIAGNOSIS — Z7982 Long term (current) use of aspirin: Secondary | ICD-10-CM | POA: Insufficient documentation

## 2020-09-04 DIAGNOSIS — E559 Vitamin D deficiency, unspecified: Secondary | ICD-10-CM | POA: Diagnosis not present

## 2020-09-04 DIAGNOSIS — E782 Mixed hyperlipidemia: Secondary | ICD-10-CM | POA: Diagnosis not present

## 2020-09-04 HISTORY — DX: Fatty (change of) liver, not elsewhere classified: K76.0

## 2020-09-04 HISTORY — DX: Prediabetes: R73.03

## 2020-09-04 HISTORY — DX: Unspecified malignant neoplasm of skin, unspecified: C44.90

## 2020-09-04 HISTORY — DX: Unspecified chronic bronchitis: J42

## 2020-09-04 LAB — HEMOGLOBIN A1C
Hgb A1c MFr Bld: 5.8 % — ABNORMAL HIGH (ref 4.8–5.6)
Mean Plasma Glucose: 119.76 mg/dL

## 2020-09-04 SURGERY — CHOLECYSTECTOMY, ROBOT-ASSISTED, LAPAROSCOPIC
Anesthesia: General

## 2020-09-04 MED ORDER — ONDANSETRON HCL 4 MG/2ML IJ SOLN: 4.0000 mg | Freq: Once | INTRAMUSCULAR | Status: DC | PRN

## 2020-09-04 MED ORDER — PHENYLEPHRINE 40 MCG/ML (10ML) SYRINGE FOR IV PUSH (FOR BLOOD PRESSURE SUPPORT)
PREFILLED_SYRINGE | INTRAVENOUS | Status: AC
  Filled 2020-09-04: qty 10

## 2020-09-04 MED ORDER — PROPOFOL 10 MG/ML IV BOLUS
INTRAVENOUS | Status: AC
  Filled 2020-09-04: qty 20

## 2020-09-04 MED ORDER — BUPIVACAINE-EPINEPHRINE 0.25% -1:200000 IJ SOLN
INTRAMUSCULAR | Status: DC | PRN
  Administered 2020-09-04: 30 mL

## 2020-09-04 MED ORDER — SODIUM CHLORIDE 0.9% FLUSH: 3.0000 mL | INTRAVENOUS | Status: DC | PRN

## 2020-09-04 MED ORDER — INDOCYANINE GREEN 25 MG IV SOLR
2.5000 mg | Freq: Once | INTRAVENOUS | Status: AC
  Administered 2020-09-04: 2.5 mg via INTRAVENOUS
  Filled 2020-09-04: qty 1

## 2020-09-04 MED ORDER — ORAL CARE MOUTH RINSE: 15.0000 mL | Freq: Once | OROMUCOSAL | Status: AC

## 2020-09-04 MED ORDER — ROCURONIUM BROMIDE 10 MG/ML (PF) SYRINGE
PREFILLED_SYRINGE | INTRAVENOUS | Status: DC | PRN
  Administered 2020-09-04: 20 mg via INTRAVENOUS
  Administered 2020-09-04: 60 mg via INTRAVENOUS

## 2020-09-04 MED ORDER — ROCURONIUM BROMIDE 10 MG/ML (PF) SYRINGE
PREFILLED_SYRINGE | INTRAVENOUS | Status: AC
  Filled 2020-09-04: qty 10

## 2020-09-04 MED ORDER — FENTANYL CITRATE (PF) 100 MCG/2ML IJ SOLN
INTRAMUSCULAR | Status: AC
  Filled 2020-09-04: qty 2

## 2020-09-04 MED ORDER — DEXAMETHASONE SODIUM PHOSPHATE 10 MG/ML IJ SOLN
INTRAMUSCULAR | Status: DC | PRN
  Administered 2020-09-04: 8 mg via INTRAVENOUS

## 2020-09-04 MED ORDER — ONDANSETRON HCL 4 MG/2ML IJ SOLN
INTRAMUSCULAR | Status: AC
  Filled 2020-09-04: qty 2

## 2020-09-04 MED ORDER — FENTANYL CITRATE (PF) 100 MCG/2ML IJ SOLN: 25.0000 ug | INTRAMUSCULAR | Status: DC | PRN

## 2020-09-04 MED ORDER — LACTATED RINGERS IV SOLN: INTRAVENOUS | Status: DC

## 2020-09-04 MED ORDER — OXYCODONE HCL 5 MG PO TABS
5.0000 mg | ORAL_TABLET | Freq: Once | ORAL | Status: DC | PRN
Start: 2020-09-04 — End: 2020-09-04

## 2020-09-04 MED ORDER — OXYCODONE HCL 5 MG/5ML PO SOLN
5.0000 mg | Freq: Once | ORAL | Status: DC | PRN
Start: 2020-09-04 — End: 2020-09-04

## 2020-09-04 MED ORDER — ACETAMINOPHEN 500 MG PO TABS
1000.0000 mg | ORAL_TABLET | ORAL | Status: AC
  Administered 2020-09-04: 1000 mg via ORAL
  Filled 2020-09-04: qty 2

## 2020-09-04 MED ORDER — CHLORHEXIDINE GLUCONATE 4 % EX LIQD: 60.0000 mL | Freq: Once | CUTANEOUS | Status: DC

## 2020-09-04 MED ORDER — PHENYLEPHRINE 40 MCG/ML (10ML) SYRINGE FOR IV PUSH (FOR BLOOD PRESSURE SUPPORT)
PREFILLED_SYRINGE | INTRAVENOUS | Status: DC | PRN
  Administered 2020-09-04: 200 ug via INTRAVENOUS
  Administered 2020-09-04: 120 ug via INTRAVENOUS

## 2020-09-04 MED ORDER — BUPIVACAINE-EPINEPHRINE (PF) 0.25% -1:200000 IJ SOLN
INTRAMUSCULAR | Status: AC
  Filled 2020-09-04: qty 30

## 2020-09-04 MED ORDER — GABAPENTIN 300 MG PO CAPS
300.0000 mg | ORAL_CAPSULE | ORAL | Status: AC
  Administered 2020-09-04: 300 mg via ORAL
  Filled 2020-09-04: qty 1

## 2020-09-04 MED ORDER — APREPITANT 40 MG PO CAPS
40.0000 mg | ORAL_CAPSULE | Freq: Once | ORAL | Status: AC
  Administered 2020-09-04: 40 mg via ORAL
  Filled 2020-09-04: qty 1

## 2020-09-04 MED ORDER — DEXAMETHASONE SODIUM PHOSPHATE 10 MG/ML IJ SOLN
INTRAMUSCULAR | Status: AC
  Filled 2020-09-04: qty 1

## 2020-09-04 MED ORDER — CEFAZOLIN SODIUM-DEXTROSE 2-4 GM/100ML-% IV SOLN
2.0000 g | INTRAVENOUS | Status: AC
  Administered 2020-09-04: 2 g via INTRAVENOUS
  Filled 2020-09-04: qty 100

## 2020-09-04 MED ORDER — LIDOCAINE 2% (20 MG/ML) 5 ML SYRINGE
INTRAMUSCULAR | Status: AC
  Filled 2020-09-04: qty 5

## 2020-09-04 MED ORDER — TRAMADOL HCL 50 MG PO TABS: 50.0000 mg | ORAL_TABLET | Freq: Four times a day (QID) | ORAL | 0 refills | Status: AC | PRN

## 2020-09-04 MED ORDER — DOCUSATE SODIUM 100 MG PO CAPS: 100.0000 mg | ORAL_CAPSULE | Freq: Two times a day (BID) | ORAL | 0 refills | Status: AC

## 2020-09-04 MED ORDER — LIDOCAINE 2% (20 MG/ML) 5 ML SYRINGE
INTRAMUSCULAR | Status: DC | PRN
  Administered 2020-09-04: 80 mg via INTRAVENOUS

## 2020-09-04 MED ORDER — FENTANYL CITRATE (PF) 250 MCG/5ML IJ SOLN
INTRAMUSCULAR | Status: DC | PRN
  Administered 2020-09-04 (×4): 50 ug via INTRAVENOUS

## 2020-09-04 MED ORDER — CHLORHEXIDINE GLUCONATE 0.12 % MT SOLN
15.0000 mL | Freq: Once | OROMUCOSAL | Status: AC
  Administered 2020-09-04: 15 mL via OROMUCOSAL

## 2020-09-04 MED ORDER — SODIUM CHLORIDE 0.9% FLUSH: 3.0000 mL | Freq: Two times a day (BID) | INTRAVENOUS | Status: DC

## 2020-09-04 MED ORDER — SODIUM CHLORIDE 0.9 % IV SOLN: 250.0000 mL | INTRAVENOUS | Status: DC | PRN

## 2020-09-04 MED ORDER — 0.9 % SODIUM CHLORIDE (POUR BTL) OPTIME
TOPICAL | Status: DC | PRN
  Administered 2020-09-04: 1000 mL

## 2020-09-04 MED ORDER — PROPOFOL 10 MG/ML IV BOLUS
INTRAVENOUS | Status: DC | PRN
  Administered 2020-09-04: 150 mg via INTRAVENOUS
  Administered 2020-09-04: 50 mg via INTRAVENOUS

## 2020-09-04 MED ORDER — SUGAMMADEX SODIUM 200 MG/2ML IV SOLN
INTRAVENOUS | Status: DC | PRN
  Administered 2020-09-04: 400 mg via INTRAVENOUS

## 2020-09-04 MED ORDER — MIDAZOLAM HCL 2 MG/2ML IJ SOLN
INTRAMUSCULAR | Status: AC
  Filled 2020-09-04: qty 2

## 2020-09-04 MED ORDER — ONDANSETRON HCL 4 MG/2ML IJ SOLN
INTRAMUSCULAR | Status: DC | PRN
  Administered 2020-09-04: 4 mg via INTRAVENOUS

## 2020-09-04 MED ORDER — MIDAZOLAM HCL 5 MG/5ML IJ SOLN
INTRAMUSCULAR | Status: DC | PRN
  Administered 2020-09-04: 2 mg via INTRAVENOUS

## 2020-09-04 SURGICAL SUPPLY — 47 items
APPLIER CLIP 5 13 M/L LIGAMAX5 (MISCELLANEOUS)
BLADE SURG SZ11 CARB STEEL (BLADE) ×2 IMPLANT
CHLORAPREP W/TINT 26 (MISCELLANEOUS) ×2 IMPLANT
CLIP APPLIE 5 13 M/L LIGAMAX5 (MISCELLANEOUS) IMPLANT
CLIP VESOLOCK LG 6/CT PURPLE (CLIP) ×2 IMPLANT
COVER TIP SHEARS 8 DVNC (MISCELLANEOUS) ×1 IMPLANT
COVER TIP SHEARS 8MM DA VINCI (MISCELLANEOUS) ×2
COVER WAND RF STERILE (DRAPES) IMPLANT
DECANTER SPIKE VIAL GLASS SM (MISCELLANEOUS) ×2 IMPLANT
DERMABOND ADVANCED (GAUZE/BANDAGES/DRESSINGS) ×1
DERMABOND ADVANCED .7 DNX12 (GAUZE/BANDAGES/DRESSINGS) ×1 IMPLANT
DRAPE ARM DVNC X/XI (DISPOSABLE) ×4 IMPLANT
DRAPE COLUMN DVNC XI (DISPOSABLE) ×1 IMPLANT
DRAPE DA VINCI XI ARM (DISPOSABLE) ×8
DRAPE DA VINCI XI COLUMN (DISPOSABLE) ×2
ELECT REM PT RETURN 15FT ADLT (MISCELLANEOUS) ×2 IMPLANT
GLOVE SURG ENC MOIS LTX SZ6 (GLOVE) ×4 IMPLANT
GLOVE SURG UNDER LTX SZ6.5 (GLOVE) ×4 IMPLANT
GOWN STRL REUS W/TWL LRG LVL3 (GOWN DISPOSABLE) ×4 IMPLANT
GOWN STRL REUS W/TWL XL LVL3 (GOWN DISPOSABLE) IMPLANT
GRASPER SUT TROCAR 14GX15 (MISCELLANEOUS) ×2 IMPLANT
KIT BASIN OR (CUSTOM PROCEDURE TRAY) ×2 IMPLANT
KIT PROCEDURE DA VINCI SI (MISCELLANEOUS)
KIT PROCEDURE DVNC SI (MISCELLANEOUS) IMPLANT
KIT TURNOVER KIT A (KITS) ×2 IMPLANT
MANIFOLD NEPTUNE II (INSTRUMENTS) IMPLANT
NEEDLE HYPO 22GX1.5 SAFETY (NEEDLE) ×2 IMPLANT
NEEDLE INSUFFLATION 14GA 120MM (NEEDLE) ×2 IMPLANT
PACK CARDIOVASCULAR III (CUSTOM PROCEDURE TRAY) ×2 IMPLANT
SEAL CANN UNIV 5-8 DVNC XI (MISCELLANEOUS) ×4 IMPLANT
SEAL XI 5MM-8MM UNIVERSAL (MISCELLANEOUS) ×8
SEALER VESSEL DA VINCI XI (MISCELLANEOUS)
SEALER VESSEL EXT DVNC XI (MISCELLANEOUS) IMPLANT
SET IRRIG TUBING LAPAROSCOPIC (IRRIGATION / IRRIGATOR) IMPLANT
SOL ANTI FOG 6CC (MISCELLANEOUS) ×1 IMPLANT
SOLUTION ANTI FOG 6CC (MISCELLANEOUS) ×1
SOLUTION ELECTROLUBE (MISCELLANEOUS) ×2 IMPLANT
SPONGE LAP 18X18 RF (DISPOSABLE) ×2 IMPLANT
SUT MNCRL AB 4-0 PS2 18 (SUTURE) ×2 IMPLANT
SUT VICRYL 0 UR6 27IN ABS (SUTURE) ×2 IMPLANT
SYR CONTROL 10ML LL (SYRINGE) ×2 IMPLANT
SYS RETRIEVAL 5MM INZII UNIV (BASKET) ×2
SYSTEM RETRIEVL 5MM INZII UNIV (BASKET) ×1 IMPLANT
TOWEL OR 17X26 10 PK STRL BLUE (TOWEL DISPOSABLE) ×2 IMPLANT
TOWEL OR NON WOVEN STRL DISP B (DISPOSABLE) ×2 IMPLANT
TRAY FOLEY MTR SLVR 16FR STAT (SET/KITS/TRAYS/PACK) IMPLANT
TUBING INSUFFLATION 10FT LAP (TUBING) ×2 IMPLANT

## 2020-09-04 NOTE — Anesthesia Procedure Notes (Signed)
Procedure Name: Intubation Date/Time: 09/04/2020 7:26 AM Performed by: Audry Pili, MD Pre-anesthesia Checklist: Patient identified, Emergency Drugs available, Suction available and Patient being monitored Patient Re-evaluated:Patient Re-evaluated prior to induction Oxygen Delivery Method: Circle system utilized Preoxygenation: Pre-oxygenation with 100% oxygen Induction Type: IV induction Ventilation: Mask ventilation without difficulty Laryngoscope Size: Glidescope and 3 Grade View: Grade I Tube type: Oral Number of attempts: 1 Airway Equipment and Method: Oral airway and Rigid stylet Placement Confirmation: ETT inserted through vocal cords under direct vision,  positive ETCO2 and breath sounds checked- equal and bilateral Secured at: 23 cm Tube secured with: Tape Dental Injury: Teeth and Oropharynx as per pre-operative assessment  Difficulty Due To: Difficulty was unanticipated and Difficult Airway- due to anterior larynx Comments: Initial looks by CRNA and MD with Sabra Heck 2 revealed grade 3 views. 3rd look with glidescope, grade 1 view. Anterior larynx with redundant tissues made DL difficult. Would recommend glidescope in the future. Relatively easy bag mask ventilation with oral airway.

## 2020-09-04 NOTE — Transfer of Care (Signed)
Immediate Anesthesia Transfer of Care Note  Patient: Destiny [name]  Procedure(s) Performed: XI ROBOTIC ASSISTED LAPAROSCOPIC CHOLECYSTECTOMY (N/A )  Patient Location: PACU  Anesthesia Type:General  Level of Consciousness: awake, alert  and oriented  Airway & Oxygen Therapy: Patient Spontanous Breathing and Patient connected to face mask  Post-op Assessment: Report given to RN and Post -op Vital signs reviewed and stable  Post vital signs: Reviewed and stable  Last Vitals:  Vitals Value Taken Time  BP 173/99 09/04/20 0846  Temp    Pulse 79 09/04/20 0850  Resp 11 09/04/20 0850  SpO2 97 % 09/04/20 0850  Vitals shown include unvalidated device data.  Last Pain:  Vitals:   09/04/20 0553  TempSrc:   PainSc: 0-No pain         Complications: No complications documented.

## 2020-09-04 NOTE — Op Note (Signed)
Operative Note  LOVEAH LIKE  [id number]  [id number]  09/04/2020   Surgeon: Vikki Ports A ConnorMD  Procedure performed: Robotic cholecystectomy  Preop diagnosis: Biliary colic Post-op diagnosis/intraop findings: Same  Specimens: Gallbladder Retained items: no EBL: minimal cc Complications: none  Description of procedure: After obtaining informed consent the patient was taken to the operating room and placed supine on operating room table wheregeneral endotracheal anesthesia was initiated, preoperative antibiotics were administered, SCDs applied, and a formal timeout was performed. The abdomen was prepped and draped in the usual sterile fashion and the abdomen was entered using a left subcostal veress needle after instilling the site with local. Insufflation to 89mmHg was obtained, supraumbilical 90mm trocar and camera inserted, and gross inspection revealed no evidence of injury from our entry or other intraabdominal abnormalities.  Bilateral laparoscopic assisted taps blocks were performed with Exparel mixed with quarter percent Marcaine.  Three additional 8 mm robotic trocars were introduced under direct visualization and following infiltration with local.  The robot was then docked and instruments inserted under direct visualization.    The gallbladder was retracted cephalad and the infundibulum was retracted laterally. A combination of hook electrocautery and blunt dissection was utilized to clear the peritoneum from the neck and cystic duct, circumferentially isolating the cystic artery and cystic duct and lifting the gallbladder from the cystic plate. The critical view of safety was achieved with the cystic artery, cystic duct, and liver bed visualized between them with no other structures.  This was concordant with the view on firefly.  The artery was clipped with a single clip proximally and divided as was the cystic duct with three clips on the proximal end. The gallbladder was  dissected from the liver plate using electrocautery.  Hemostasis along the liver bed was ensured with cautery as the dissection progressed. Once freed the gallbladder was placed in an endocatch bag and removed intact through the left upper quadrant trocar site.    Hemostasis was once again confirmed, and reinspection of the abdomen revealed no injuries. The clips were well opposed without any bile leak from the duct or the liver bed on direct visualization nor on firefly. The left upper quadrant extraction port site in the was closed with a 0 vicryl in the fascia under direct visualization using a PMI device. The abdomen was desufflated and all trocars removed. The skin incisions were closed with subcuticular monocryl and Dermabond. The patient was awakened, extubated and transported to the recovery room in stable condition.   All counts were correct at the completion of the case.

## 2020-09-04 NOTE — Discharge Instructions (Signed)
LAPAROSCOPIC SURGERY: POST OP INSTRUCTIONS   EAT Gradually transition to a high fiber diet with a fiber supplement over the next few weeks after discharge.  Start with a pureed / full liquid diet (see below)  WALK Walk an hour a day.  Control your pain to do that.    CONTROL PAIN Control pain so that you can walk, sleep, tolerate sneezing/coughing, go up/down stairs.  HAVE A BOWEL MOVEMENT DAILY Keep your bowels regular to avoid problems.  OK to try a laxative to override constipation.  OK to use an antidairrheal to slow down diarrhea.  Call if not better after 2 tries  CALL IF YOU HAVE PROBLEMS/CONCERNS Call if you are still struggling despite following these instructions. Call if you have concerns not answered by these instructions    1. DIET: Follow a light bland diet & liquids the first 24 hours after arrival home, such as soup, liquids, starches, etc.  Be sure to drink plenty of fluids.  Quickly advance to a usual solid diet within a few days.  Avoid fast food or heavy meals as your are more likely to get nauseated or have irregular bowels.  A low-sugar, high-fiber diet for the rest of your life is ideal.  2. Take your usually prescribed home medications unless otherwise directed.  3. PAIN CONTROL: a. Pain is [place] controlled by a usual combination of three different methods TOGETHER: i. Ice/Heat ii. Over the counter pain medication iii. Prescription pain medication b. Most patients will experience some swelling and bruising around the incisions.  Ice packs or heating pads (30-60 minutes up to 6 times a day) will help. Use ice for the first few days to help decrease swelling and bruising, then switch to heat to help relax tight/sore spots and speed recovery.  Some people prefer to use ice alone, heat alone, alternating between ice & heat.  Experiment to what works for you.  Swelling and bruising can take several weeks to resolve.   c. It is helpful to take an over-the-counter pain  medication regularly for the first few days: i. Naproxen (Aleve, etc)  Two 220mg  tabs twice a day OR Ibuprofen (Advil, etc) Three 200mg  tabs four times a day (every meal & bedtime) AND ii. Acetaminophen (Tylenol, etc) 500-650mg  four times a day (every meal & bedtime) d. A  prescription for pain medication (such as oxycodone, hydrocodone, tramadol, gabapentin, methocarbamol, etc) should be given to you upon discharge.  Take your pain medication as prescribed, IF NEEDED.  i. If you are having problems/concerns with the prescription medicine (does not control pain, nausea, vomiting, rash, itching, etc), please call us [phone] to see if we need to switch you to a different pain medicine that will work better for you and/or control your side effect better. ii. If you need a refill on your pain medication, please give [place] 48 hour notice.  contact your pharmacy.  They will contact our office to request authorization. Prescriptions will not be filled after 5 pm or on week-ends  4. Avoid getting constipated.   a. Between the surgery and the pain medications, it is common to experience some constipation.   b. Increasing fluid intake and taking a fiber supplement (such as Metamucil, Citrucel, FiberCon, MiraLax, etc) 1-2 times a day regularly will usually help prevent this problem from occurring.   c. A mild laxative (prune juice, Milk of Magnesia, MiraLax, etc) should be taken according to package directions if there are no bowel movements after 48 hours.  5. Watch out for diarrhea.   a. If you have many loose bowel movements, simplify your diet to bland foods & liquids for a few days.   b. Stop any stool softeners and decrease your fiber supplement.   c. Switching to mild anti-diarrheal medications (Kayopectate, Pepto Bismol) can help.   d. If this worsens or does not improve, please call us.  6. Wash / shower every day.  You may shower over the skin glue which is waterproof  7. Glue will flake off  after about 2 weeks.  You may leave the incision open to air.  You may replace a dressing/Band-Aid to cover the incision for comfort if you wish.   8. ACTIVITIES as tolerated:   a. You may resume regular (light) daily activities beginning the next day--such as daily self-care, walking, climbing stairs--gradually increasing activities as tolerated.  If you can walk 30 minutes without difficulty, it is safe to try more intense activity such as jogging, treadmill, bicycling, low-impact aerobics, swimming, etc. b. Save the most intensive and strenuous activity for last such as sit-ups, heavy lifting, contact sports, etc  Refrain from any heavy lifting or straining until you are off narcotics for pain control.   c. DO NOT PUSH THROUGH PAIN.  Let pain be your guide: If it hurts to do something, don't do it.  Pain is your body warning you to avoid that activity for another week until the pain goes down. d. You may drive when you are no longer taking prescription pain medication, you can comfortably wear a seatbelt, and you can safely maneuver your car and apply brakes. e. Dennis Bast may have sexual intercourse when it is comfortable.  9. FOLLOW UP in our office a. Please call [facility] at [phone] to set up an appointment to see your surgeon in the office for a follow-up appointment approximately 2-3 weeks after your surgery. b. Make sure that you call for this appointment the day you arrive home to insure a convenient appointment time.  10. IF YOU HAVE DISABILITY OR FAMILY LEAVE FORMS, BRING THEM TO THE OFFICE FOR PROCESSING.  DO NOT GIVE THEM TO YOUR DOCTOR.   WHEN TO CALL us [phone]: 1. Poor pain control 2. Reactions / problems with new medications (rash/itching, nausea, etc)  3. Fever over 101.5 F (38.5 C) 4. Inability to urinate 5. Nausea and/or vomiting 6. Worsening swelling or bruising 7. Continued bleeding from incision. 8. Increased pain, redness, or drainage from the incision   The  clinic staff is available to answer your questions during regular business hours (8:30am-5pm).  Please don't hesitate to call and ask to speak to one of our nurses for clinical concerns.   If you have a medical emergency, go to the nearest emergency room or call 911.  A surgeon from [facility] is always on call at the [facility], [place], [place], [place], [place]  [address] ? MAIN: [phone] ? TOLL FREE: [phone] ?  FAX [phone] [email or web address]

## 2020-09-04 NOTE — Telephone Encounter (Signed)
After reviewing chart. Looks like pt is currently admitted and is having her surgery. I will update pre op team as it seems surgery is being done.

## 2020-09-04 NOTE — Anesthesia Preprocedure Evaluation (Addendum)
Anesthesia Evaluation  Patient identified by MRN, date of birth, ID band Patient awake    Reviewed: Allergy & Precautions, NPO status , Patient's Chart, lab work & pertinent test results, reviewed documented beta blocker date and time   History of Anesthesia Complications Negative for: history of anesthetic complications  Airway Mallampati: II  TM Distance: >3 FB Neck ROM: Full    Dental  (+) Implants, Partial Lower, Dental Advisory Given   Pulmonary sleep apnea (suspected) , COPD,  COPD inhaler, former smoker,    Pulmonary exam normal        Cardiovascular hypertension, Pt. on home beta blockers and Pt. on medications + CAD  Normal cardiovascular exam   '19 Cath - Prox Cx lesion is 50% stenosed. FFR was done with lowest reading of 0.91. Ost 1st Diag lesion is 75% stenosed. Mid LAD lesion is 25% stenosed. The left ventricular systolic function is normal. LV end diastolic pressure is mildly elevated. LVEDP 20 mm Hg. The left ventricular ejection fraction is 55-65% by visual estimate. There is no aortic valve stenosis.     Neuro/Psych negative neurological ROS  negative psych ROS   GI/Hepatic Neg liver ROS, hiatal hernia, GERD  Controlled,  Endo/Other  Hypothyroidism Morbid obesity Pre-DM   Renal/GU negative Renal ROS     Musculoskeletal  (+) Arthritis ,   Abdominal   Peds  Hematology negative hematology ROS (+)   Anesthesia Other Findings Covid test negative   Reproductive/Obstetrics                            Anesthesia Physical Anesthesia Plan  ASA: III  Anesthesia Plan: General   Post-op Pain Management:    Induction: Intravenous  PONV Risk Score and Plan: 4 or greater and Treatment may vary due to age or medical condition, Ondansetron and Dexamethasone  Airway Management Planned: Oral ETT  Additional Equipment: None  Intra-op Plan:   Post-operative Plan:  Extubation in OR  Informed Consent: I have reviewed the patients History and Physical, chart, labs and discussed the procedure including the risks, benefits and alternatives for the proposed anesthesia with the patient or authorized representative who has indicated his/her understanding and acceptance.     Dental advisory given  Plan Discussed with: CRNA and Anesthesiologist  Anesthesia Plan Comments:        Anesthesia Quick Evaluation

## 2020-09-04 NOTE — Interval H&P Note (Signed)
History and Physical Interval Note:  09/04/2020 6:56 AM  Destiny [name]  has presented today for surgery, with the diagnosis of CHOLECYSTITIS.  The various methods of treatment have been discussed with the patient and family. After consideration of risks, benefits and other options for treatment, the patient has consented to  Procedure(s): XI ROBOTIC [place] (N/A) as a surgical intervention.  The patient's history has been reviewed, patient examined, no change in status, stable for surgery.  I have reviewed the patient's chart and labs.  Questions were answered to the patient's satisfaction.     Saadia Dewitt Rich Brave

## 2020-09-05 LAB — SURGICAL PATHOLOGY

## 2020-09-05 NOTE — Anesthesia Postprocedure Evaluation (Signed)
Anesthesia Post Note  Patient: Destiny [name]  Procedure(s) Performed: XI ROBOTIC ASSISTED LAPAROSCOPIC CHOLECYSTECTOMY (N/A )     Patient location during evaluation: PACU Anesthesia Type: General Level of consciousness: awake and alert Pain management: pain level controlled Vital Signs Assessment: post-procedure vital signs reviewed and stable Respiratory status: spontaneous breathing, nonlabored ventilation and respiratory function stable Cardiovascular status: blood pressure returned to baseline and stable Postop Assessment: no apparent nausea or vomiting Anesthetic complications: no   No complications documented.  Last Vitals:  Vitals:   09/04/20 1000 09/04/20 1015  BP: (!) 152/81 (!) 146/72  Pulse: 73 69  Resp: 15 15  Temp: 36.6 C   SpO2: 93% 93%    Last Pain:  Vitals:   09/04/20 1015  TempSrc:   PainSc: 0-No pain                 Audry Pili

## 2020-09-06 ENCOUNTER — Encounter: Payer: Self-pay | Admitting: Family Medicine

## 2020-09-09 ENCOUNTER — Other Ambulatory Visit: Payer: Self-pay | Admitting: Family Medicine

## 2020-09-09 MED ORDER — BENZONATATE 100 MG PO CAPS: 100.0000 mg | ORAL_CAPSULE | Freq: Two times a day (BID) | ORAL | 1 refills | Status: DC | PRN

## 2020-09-23 DIAGNOSIS — L578 Other skin changes due to chronic exposure to nonionizing radiation: Secondary | ICD-10-CM | POA: Diagnosis not present

## 2020-09-23 DIAGNOSIS — L281 Prurigo nodularis: Secondary | ICD-10-CM | POA: Diagnosis not present

## 2020-09-23 DIAGNOSIS — L821 Other seborrheic keratosis: Secondary | ICD-10-CM | POA: Diagnosis not present

## 2020-09-23 DIAGNOSIS — D225 Melanocytic nevi of trunk: Secondary | ICD-10-CM | POA: Diagnosis not present

## 2020-10-10 DIAGNOSIS — I8312 Varicose veins of left lower extremity with inflammation: Secondary | ICD-10-CM | POA: Diagnosis not present

## 2020-10-22 ENCOUNTER — Ambulatory Visit: Payer: Medicare Other | Admitting: Family Medicine

## 2020-10-31 DIAGNOSIS — E039 Hypothyroidism, unspecified: Secondary | ICD-10-CM | POA: Diagnosis not present

## 2020-11-15 DIAGNOSIS — E782 Mixed hyperlipidemia: Secondary | ICD-10-CM | POA: Diagnosis not present

## 2020-11-15 DIAGNOSIS — T466X5A Adverse effect of antihyperlipidemic and antiarteriosclerotic drugs, initial encounter: Secondary | ICD-10-CM | POA: Diagnosis not present

## 2020-11-15 DIAGNOSIS — M791 Myalgia, unspecified site: Secondary | ICD-10-CM | POA: Diagnosis not present

## 2020-11-15 LAB — LIPID PANEL
Chol/HDL Ratio: 3.4 ratio (ref 0.0–4.4)
Cholesterol, Total: 128 mg/dL (ref 100–199)
HDL: 38 mg/dL — ABNORMAL LOW (ref >39)
LDL Chol Calc (NIH): 56 mg/dL (ref 0–99)
Triglycerides: 211 mg/dL — ABNORMAL HIGH (ref 0–149)
VLDL Cholesterol Cal: 34 mg/dL (ref 5–40)

## 2020-12-06 ENCOUNTER — Encounter: Payer: Self-pay | Admitting: Internal Medicine

## 2020-12-06 ENCOUNTER — Ambulatory Visit (INDEPENDENT_AMBULATORY_CARE_PROVIDER_SITE_OTHER): Payer: Medicare Other | Admitting: Internal Medicine

## 2020-12-06 ENCOUNTER — Other Ambulatory Visit: Payer: Self-pay

## 2020-12-06 VITALS — BP 116/66 | HR 66 | Ht 63.0 in | Wt 226.2 lb

## 2020-12-06 DIAGNOSIS — I2510 Atherosclerotic heart disease of native coronary artery without angina pectoris: Secondary | ICD-10-CM

## 2020-12-06 DIAGNOSIS — E782 Mixed hyperlipidemia: Secondary | ICD-10-CM | POA: Diagnosis not present

## 2020-12-06 DIAGNOSIS — T466X5D Adverse effect of antihyperlipidemic and antiarteriosclerotic drugs, subsequent encounter: Secondary | ICD-10-CM

## 2020-12-06 DIAGNOSIS — M791 Myalgia, unspecified site: Secondary | ICD-10-CM | POA: Diagnosis not present

## 2020-12-06 DIAGNOSIS — I10 Essential (primary) hypertension: Secondary | ICD-10-CM | POA: Diagnosis not present

## 2020-12-06 NOTE — Progress Notes (Signed)
LIPID CLINIC CONSULT NOTE  Chief Complaint:  Follow-up dyslipidemia  Primary Care Physician: Mosie Lukes, MD  Primary Cardiologist:  Jenean Lindau, MD  HPI:  Destiny [name] is a 70 y.o. female who is being seen today for the evaluation of dyslipidemia at the request of Mosie Lukes, MD. This is a pleasant 70 year old female with past medical history significant for moderate obesity, hypertension, dyslipidemia, hypothyroidism, palpitations and prior chest pain which led to heart catheterization in November 2019.  This demonstrated a 50% proximal circumflex lesion which was not significant by FFR, a small 75% ostial diagonal lesion as well as a mid LAD lesion of 25% stenosis.  LVEDP was elevated at 20 mmHg and LVEF was normal.  Based on these findings aggressive medical therapy was recommended.  At that time her chest pain seemed somewhat atypical and according to her still persists.  It feels like a twinge and is present at rest and with exertion.  She was however found to have a markedly abnormal lipid profile.  In addition she is struggled with longstanding hypothyroidism and recently had converted from Armour Thyroid over to Synthroid.  Her dose was increased up to 100 mcg daily by her PCP in July.  Was a month ago which showed total cholesterol 263, HDL 39, triglycerides 216, and LDL 183.  He has had triglycerides over 400 in the past.  She reports her diet has been generally unhealthy and does eat a lot of fast food, fried foods and saturated fats.  Recently she started the 56day diet which she says is good so far and she feels like she would likely see a change in her lipids.  She also has a history of statin intolerance.  She has been tried on both rosuvastatin, atorvastatin, and ezetimibe, all of which have caused significant worsening of myalgias.  04/24/2019  Destiny [name] returns today for follow-up of dyslipidemia.  She successfully been using Repatha which is both been  affordable and easy to use.  She has no significant side effects.  She has discontinued her red yeast rice.  Unfortunately she has not had repeat lipid testing.  We will plan to do that today since she is fasting.  She denies any side effects such as myalgia.  10/23/2019  Destiny [name] is seen today for follow-up.  She is done well on Repatha.  Total cholesterol now 128, triglycerides 158, HDL 44 and LDL 51.  She reports some occasional chest discomfort, not associated with exertion always or relieved by rest.  It is not limited her activities.  She has not required any nitro.  She has not had a new prescription in a while.  Her cardiologist Dr. Geraldo Pitter saw her last in 2019.  I would advise follow-up there.  12/06/2020  Destiny [name] is seen today in follow-up.  Overall she continues to do well on Repatha.  Recent lipids showed a slight increase in triglycerides however she had some difficulty with her thyroid and was somewhat hypothyroid, now it has been repleted.  Total cholesterol 128, triglycerides 211, HDL 38 and LDL 56.  Overall she says she feels well denies chest pain or worsening shortness of breath.  PMHx:  Past Medical History:  Diagnosis Date   Abnormal blood chemistry    Arthropathy    Back pain    Breast cyst    Cellulitis    Chest discomfort    Chronic bronchitis (HCC)    Constipation    Disturbance of  skin sensation    Edema    Encounter for screening and preventative care 06/17/2016   Fatty liver    GERD (gastroesophageal reflux disease)    Hiatal hernia with gastroesophageal reflux 06/17/2016   High risk medication use    Hoarseness 06/15/2016   Hyperglycemia    Hyperglycemia 12/21/2016   Hyperlipidemia    Hyperlipidemia, mixed 06/15/2016   Hypertension    Hypopotassemia    Hypothyroidism    Joint pain    Menopause    Obesity    Palpitations    Positive ANA (antinuclear antibody)    Pre-diabetes    Shoulder pain, right    Skin cancer    Leg   Sleep apnea    denies   SOB  (shortness of breath)    Thyroid disease 12/11/2014   Thyroid nodule    Urinary incontinence 04/01/2017   Vitamin D deficiency    Vitamin D deficiency     Past Surgical History:  Procedure Laterality Date   BREAST CYST ASPIRATION Left    30 years ago  1970's or 1980's   INTRAVASCULAR PRESSURE WIRE/FFR STUDY N/A 04/25/2018   Procedure: INTRAVASCULAR PRESSURE WIRE/FFR STUDY;  Surgeon: Jettie Booze, MD;  Location: [facility];  Service: Cardiovascular;  Laterality: N/A;   LEFT HEART CATH AND CORONARY ANGIOGRAPHY N/A 04/25/2018   Procedure: LEFT HEART CATH AND CORONARY ANGIOGRAPHY;  Surgeon: Jettie Booze, MD;  Location: [facility];  Service: Cardiovascular;  Laterality: N/A;   LIPOSUCTION  1990   over gluteal area    VEIN SURGERY Left 2022   WISDOM TOOTH EXTRACTION     and all upper teeth    FAMHx:  Family History  Problem Relation Age of Onset   Irregular heart beat Mother    Heart disease Mother    Rheumatic fever Mother    Hypertension Father    Ulcers Father    Hyperlipidemia Father    Stroke Maternal Grandmother    Diabetes Maternal Grandmother    Hypertension Paternal Grandmother    Ulcers Paternal Grandfather     SOCHx:   reports that she quit smoking about 11 years ago. Her smoking use included cigarettes. She has a 27.00 pack-year smoking history. She has never used smokeless tobacco. She reports current alcohol use. She reports that she does not use drugs.  ALLERGIES:  Allergies  Allergen Reactions   Crestor [Rosuvastatin Calcium] Other (See Comments)    myalgias   Diclofenac Itching, Nausea Only and Other (See Comments)    Wheezing   Hydrochlorothiazide     Wheezing. Losing teeth.   Statins     ROS: Pertinent items noted in HPI and remainder of comprehensive ROS otherwise negative.  HOME MEDS: Current Outpatient Medications on File Prior to Visit  Medication Sig Dispense Refill   albuterol (VENTOLIN HFA) 108 (90 Base)  MCG/ACT inhaler Inhale 2 puffs into the lungs every 6 (six) hours as needed for wheezing or shortness of breath. 18 g 5   aspirin EC 81 MG tablet Take 1 tablet (81 mg total) by mouth daily. 90 tablet 3   betamethasone dipropionate 0.05 % cream Apply 1 application topically 2 (two) times daily as needed (rash).     co-enzyme Q-10 50 MG capsule Take 50 mg by mouth daily.     Evolocumab (REPATHA SURECLICK) 102 MG/ML SOAJ Inject 1 Dose into the skin every 14 (fourteen) days. (Patient taking differently: Inject 140 mg into the skin every 14 (fourteen) days.)  6 pen 3   irbesartan (AVAPRO) 150 MG tablet Take 1 tablet (150 mg total) by mouth daily. 90 tablet 1   metoprolol succinate (TOPROL-XL) 25 MG 24 hr tablet Take 1 tablet (25 mg total) by mouth daily. 90 tablet 1   nitroGLYCERIN (NITROSTAT) 0.4 MG SL tablet Place 1 tablet (0.4 mg total) under the tongue every 5 (five) minutes as needed for chest pain. 25 tablet 3   thyroid (ARMOUR) 30 MG tablet Take 30 mg by mouth.     thyroid (ARMOUR) 90 MG tablet Take 90 mg by mouth daily.     Vitamin D, Ergocalciferol, (DRISDOL) 1.25 MG (50000 UNIT) CAPS capsule TAKE ONE CAPSULE BY MOUTH EVERY SEVEN DAYS (MAY CONTAIN SOY OR PEANUT--TALK TO YOUR DOCTOR BEFORE TAKING IF YOU ARE ALLERGIC) (Patient taking differently: Take 50,000 Units by mouth every 7 (seven) days.) 12 capsule 3   Zinc 100 MG TABS Take 100 mg by mouth daily.     benzonatate (TESSALON) 100 MG capsule Take 1 capsule (100 mg total) by mouth 2 (two) times daily as needed for cough. (Patient not taking: Reported on 12/06/2020) 20 capsule 1   No current facility-administered medications on file prior to visit.    LABS/IMAGING: No results found for this or any previous visit (from the past 48 hour(s)). No results found.  LIPID PANEL:    Component Value Date/Time   CHOL 128 11/15/2020 1026   TRIG 211 (H) 11/15/2020 1026   HDL 38 (L) 11/15/2020 1026   CHOLHDL 3.4 11/15/2020 1026   CHOLHDL 3 04/18/2020  1418   VLDL 47.4 (H) 04/18/2020 1418   LDLCALC 56 11/15/2020 1026   LDLCALC 183 (H) 12/08/2018 1531   LDLDIRECT 71.0 04/18/2020 1418    WEIGHTS: Wt Readings from Last 3 Encounters:  12/06/20 226 lb 3.2 oz (102.6 kg)  09/04/20 221 lb (100.2 kg)  08/22/20 220 lb (99.8 kg)    VITALS: BP 116/66 (BP Location: Left Arm, Patient Position: Sitting, Cuff Size: Large)   Pulse 66   Ht 5\' 3"  (1.6 m)   Wt 226 lb 3.2 oz (102.6 kg)   SpO2 96%   BMI 40.07 kg/m   EXAM: General appearance: alert and no distress Lungs: clear to auscultation bilaterally Heart: regular rate and rhythm Extremities: extremities normal, atraumatic, no cyanosis or edema Neurologic: Grossly normal  EKG: Deferred  ASSESSMENT: Mixed dyslipidemia with elevated LDL and triglycerides Coronary artery disease with moderate to severe disease by cath (04/2018)-managed medically Morbid obesity Hypertension Palpitations Hypothyroidism  PLAN: 1.   Destiny [name] continues to have good control over her cholesterol although triglycerides were slightly elevated.  Recently she was hypothyroid and needed a medication adjustment.  This probably explains it.  She did have some underlying coronary artery disease at cath which is being medically managed.  Rather than seeing to cardiologist, she wants to follow with me for both her lipids and general cardiology needs.  I am happy to accommodate this request if it is okay with Dr. Geraldo Pitter.  Follow-up annually or sooner as necessary.  Pixie Casino, MD, [facility], [place] Director of the Advanced Lipid Disorders &  Cardiovascular Risk Reduction Clinic Diplomate of the American Board of Clinical Lipidology Attending Cardiologist  Direct Dial: [phone]  Fax: [phone]  Website:  [email or web address] [email or web address] 12/06/2020, 4:20 PM

## 2020-12-06 NOTE — Patient Instructions (Signed)
Medication Instructions:  Your physician recommends that you continue on your current medications as directed. Please refer to the Current Medication list given to you today.  *If you need a refill on your cardiac medications before your next appointment, please call your pharmacy*   Lab Work: FASTING lab work to check cholesterol in 1 year  If you have labs (blood work) drawn today and your tests are completely normal, you will receive your results only by: [place] (if you have MyChart) OR A paper copy in the mail If you have any lab test that is abnormal or we need to change your treatment, we will call you to review the results.   Testing/Procedures: NONE   Follow-Up: At [facility], you and your health needs are our priority.  As part of our continuing mission to provide you with exceptional heart care, we have created designated Provider Care Teams.  These Care Teams include your primary Cardiologist (physician) and Advanced Practice Providers (APPs -  Physician Assistants and Nurse Practitioners) who all work together to provide you with the care you need, when you need it.  We recommend signing up for the patient portal called "MyChart".  Sign up information is provided on this After Visit Summary.  MyChart is used to connect with patients for Virtual Visits (Telemedicine).  Patients are able to view lab/test results, encounter notes, upcoming appointments, etc.  Non-urgent messages can be sent to your provider as well.   To learn more about what you can do with MyChart, go to [email or web address].    Your next appointment:   12 month(s)  The format for your next appointment:   In Person  Provider:   K. Mali Hilty, MD   Other Instructions

## 2020-12-23 ENCOUNTER — Encounter: Payer: Self-pay | Admitting: Family Medicine

## 2020-12-23 ENCOUNTER — Other Ambulatory Visit: Payer: Self-pay

## 2020-12-23 DIAGNOSIS — I10 Essential (primary) hypertension: Secondary | ICD-10-CM

## 2020-12-23 MED ORDER — IRBESARTAN 150 MG PO TABS: 150.0000 mg | ORAL_TABLET | Freq: Every day | ORAL | 1 refills | Status: DC

## 2020-12-23 MED ORDER — METOPROLOL SUCCINATE ER 25 MG PO TB24: 25.0000 mg | ORAL_TABLET | Freq: Every day | ORAL | 1 refills | Status: DC

## 2021-01-03 DIAGNOSIS — E039 Hypothyroidism, unspecified: Secondary | ICD-10-CM | POA: Diagnosis not present

## 2021-01-08 DIAGNOSIS — L820 Inflamed seborrheic keratosis: Secondary | ICD-10-CM | POA: Diagnosis not present

## 2021-01-13 ENCOUNTER — Other Ambulatory Visit: Payer: Self-pay

## 2021-01-13 ENCOUNTER — Telehealth (INDEPENDENT_AMBULATORY_CARE_PROVIDER_SITE_OTHER): Payer: Medicare Other | Admitting: Family Medicine

## 2021-01-13 ENCOUNTER — Telehealth: Payer: Self-pay

## 2021-01-13 ENCOUNTER — Encounter: Payer: Self-pay | Admitting: Family Medicine

## 2021-01-13 ENCOUNTER — Telehealth: Payer: Self-pay | Admitting: *Deleted

## 2021-01-13 VITALS — Ht 63.0 in | Wt 218.0 lb

## 2021-01-13 DIAGNOSIS — U071 COVID-19: Secondary | ICD-10-CM | POA: Diagnosis not present

## 2021-01-13 MED ORDER — NIRMATRELVIR/RITONAVIR (PAXLOVID)TABLET: 3.0000 | ORAL_TABLET | Freq: Two times a day (BID) | ORAL | 0 refills | Status: AC

## 2021-01-13 MED ORDER — MOLNUPIRAVIR EUA 200MG CAPSULE: 4.0000 | ORAL_CAPSULE | Freq: Two times a day (BID) | ORAL | 0 refills | Status: DC

## 2021-01-13 MED ORDER — ONDANSETRON HCL 8 MG PO TABS: 8.0000 mg | ORAL_TABLET | Freq: Three times a day (TID) | ORAL | 0 refills | Status: DC | PRN

## 2021-01-13 NOTE — Telephone Encounter (Signed)
[facility] does not have molnupiravir on hand They asked to substitute Paxlovid which is ok- rx sent

## 2021-01-13 NOTE — Telephone Encounter (Signed)
Spoke with patient and she stated she was around her friend who is positive for covid on Wednesday.  Her symptoms started on Thursday.  Patient set up for an appt today at 1140am with [place].  She wanted to a telephone call as she stated "that she feels sick and does not want to do any computer."

## 2021-01-13 NOTE — Telephone Encounter (Signed)
Call Type Triage / Clinical Relationship To Patient Self Return Phone Number [phone] (Primary) Chief Complaint Headache Reason for Call Symptomatic / Request for Health Information Initial Comment Caller states she has covid experiencing chest congestion with sneezing headache and a cough. Translation No Nurse Assessment Nurse: Raphael Gibney, RN, Vanita Ingles Date/Time (Eastern Time): 01/13/2021 7:36:46 AM Confirm and document reason for call. If symptomatic, describe symptoms. ---Caller states she has not tested herself for COVID. spouse is positive. Cough started yesterday. body aches. no fever. sneezing, headache and nasal congestion started on Thursday night. has had 2 immunizations and 1 booster.

## 2021-01-13 NOTE — Progress Notes (Signed)
[place] [facility] [address], [place], [place] [address] [phone]  Date:  01/13/2021   Name:  Destiny [name]   DOB:  1951/04/22   MRN:  [id number]  PCP:  Mosie Lukes, MD    Chief Complaint: No chief complaint on file.   History of Present Illness:  Destiny [name] is a 70 y.o. very pleasant female patient who presents with the following:  Primary patient of my partner Dr. [name], virtual visit today for concern of possible COVID-19  Patient location is home, my location is office.  Patient identity is confirmed with 2 factors, she gives consent for virtual visit today The patient myself are present for call today Pt feels miserable and is not up to doing a video visit today- she requests phone only   She is vaccinated against COVID-19 History of of CAD, obesity, hyperlipidemia and hypertension  Her sx started on Thursday- today is Monday Sx are nausea, cough, headache, runny nose She tested positive today No fever noted- she has checked several times She did vomit and has had diarrhea  She notes body aches and chills She has used tylenol for her sx  Her husband is ill as well- his doctor is at the [place] She is not SOB She does not have a pulse ox at home   Patient Active Problem List   Diagnosis Date Noted   Family history of premature CAD 05/13/2018   CAD (coronary artery disease) 05/13/2018   Endometrium, hyperplasia 04/13/2018   Vitamin B12 deficiency 04/12/2018   Arthritis 04/12/2018   Constipation 02/10/2018   Abdominal pain 02/10/2018   Atypical chest pain 02/10/2018   Dyspepsia 02/10/2018   Urinary incontinence 04/01/2017   Morbid obesity ([place]) 01/25/2017   Mixed hyperlipidemia 01/07/2017   Vitamin D deficiency 01/07/2017   Hyperglycemia 12/21/2016   Plantar fasciitis 09/02/2016   Preventative health care 06/17/2016   Hiatal hernia with gastroesophageal reflux 06/17/2016   Hoarseness 06/15/2016    DDD (degenerative disc disease), cervical 05/04/2016   Foraminal stenosis of cervical region 05/04/2016   Neck pain 05/04/2016   Numbness and tingling in both hands 05/04/2016   Essential hypertension 03/26/2016   Hypothyroidism 12/11/2014    Past Medical History:  Diagnosis Date   Abnormal blood chemistry    Arthropathy    Back pain    Breast cyst    Cellulitis    Chest discomfort    Chronic bronchitis (HCC)    Constipation    Disturbance of skin sensation    Edema    Encounter for screening and preventative care 06/17/2016   Fatty liver    GERD (gastroesophageal reflux disease)    Hiatal hernia with gastroesophageal reflux 06/17/2016   High risk medication use    Hoarseness 06/15/2016   Hyperglycemia    Hyperglycemia 12/21/2016   Hyperlipidemia    Hyperlipidemia, mixed 06/15/2016   Hypertension    Hypopotassemia    Hypothyroidism    Joint pain    Menopause    Obesity    Palpitations    Positive ANA (antinuclear antibody)    Pre-diabetes    Shoulder pain, right    Skin cancer    Leg   Sleep apnea    denies   SOB (shortness of breath)    Thyroid disease 12/11/2014   Thyroid nodule    Urinary incontinence 04/01/2017   Vitamin D deficiency    Vitamin D deficiency  Past Surgical History:  Procedure Laterality Date   BREAST CYST ASPIRATION Left    30 years ago  1970's or 1980's   INTRAVASCULAR PRESSURE WIRE/FFR STUDY N/A 04/25/2018   Procedure: INTRAVASCULAR PRESSURE WIRE/FFR STUDY;  Surgeon: Jettie Booze, MD;  Location: [facility];  Service: Cardiovascular;  Laterality: N/A;   LEFT HEART CATH AND CORONARY ANGIOGRAPHY N/A 04/25/2018   Procedure: LEFT HEART CATH AND CORONARY ANGIOGRAPHY;  Surgeon: Jettie Booze, MD;  Location: [facility];  Service: Cardiovascular;  Laterality: N/A;   LIPOSUCTION  1990   over gluteal area    VEIN SURGERY Left 2022   WISDOM TOOTH EXTRACTION     and all upper teeth    Social History   Tobacco  Use   Smoking status: Former    Packs/day: 1.00    Years: 27.00    Pack years: 27.00    Types: Cigarettes    Quit date: 2011    Years since quitting: 11.6   Smokeless tobacco: Never  Vaping Use   Vaping Use: Never used  Substance Use Topics   Alcohol use: Yes    Alcohol/week: 0.0 standard drinks   Drug use: No    Family History  Problem Relation Age of Onset   Irregular heart beat Mother    Heart disease Mother    Rheumatic fever Mother    Hypertension Father    Ulcers Father    Hyperlipidemia Father    Stroke Maternal Grandmother    Diabetes Maternal Grandmother    Hypertension Paternal Grandmother    Ulcers Paternal Grandfather     Allergies  Allergen Reactions   Crestor [Rosuvastatin Calcium] Other (See Comments)    myalgias   Diclofenac Itching, Nausea Only and Other (See Comments)    Wheezing   Hydrochlorothiazide     Wheezing. Losing teeth.   Statins     Medication list has been reviewed and updated.  Current Outpatient Medications on File Prior to Visit  Medication Sig Dispense Refill   albuterol (VENTOLIN HFA) 108 (90 Base) MCG/ACT inhaler Inhale 2 puffs into the lungs every 6 (six) hours as needed for wheezing or shortness of breath. 18 g 5   aspirin EC 81 MG tablet Take 1 tablet (81 mg total) by mouth daily. 90 tablet 3   benzonatate (TESSALON) 100 MG capsule Take 1 capsule (100 mg total) by mouth 2 (two) times daily as needed for cough. (Patient not taking: Reported on 12/06/2020) 20 capsule 1   betamethasone dipropionate 0.05 % cream Apply 1 application topically 2 (two) times daily as needed (rash).     co-enzyme Q-10 50 MG capsule Take 50 mg by mouth daily.     Evolocumab (REPATHA SURECLICK) [id number] MG/ML SOAJ Inject 1 Dose into the skin every 14 (fourteen) days. (Patient taking differently: Inject 140 mg into the skin every 14 (fourteen) days.) 6 pen 3   irbesartan (AVAPRO) 150 MG tablet Take 1 tablet (150 mg total) by mouth daily. 90 tablet 1    metoprolol succinate (TOPROL-XL) 25 MG 24 hr tablet Take 1 tablet (25 mg total) by mouth daily. 90 tablet 1   nitroGLYCERIN (NITROSTAT) 0.4 MG SL tablet Place 1 tablet (0.4 mg total) under the tongue every 5 (five) minutes as needed for chest pain. 25 tablet 3   thyroid (ARMOUR) 30 MG tablet Take 30 mg by mouth.     thyroid (ARMOUR) 90 MG tablet Take 90 mg by mouth daily.  Vitamin D, Ergocalciferol, (DRISDOL) 1.25 MG (50000 UNIT) CAPS capsule TAKE ONE CAPSULE BY MOUTH EVERY SEVEN DAYS (MAY CONTAIN SOY OR PEANUT--TALK TO YOUR DOCTOR BEFORE TAKING IF YOU ARE ALLERGIC) (Patient taking differently: Take 50,000 Units by mouth every 7 (seven) days.) 12 capsule 3   Zinc 100 MG TABS Take 100 mg by mouth daily.     No current facility-administered medications on file prior to visit.    Review of Systems:  As per HPI- otherwise negative.   Physical Examination: There were no vitals filed for this visit. There were no vitals filed for this visit. There is no height or weight on file to calculate BMI. Ideal Body Weight:    Pt sounds like she does not feel well but no distress or SOB noted   Assessment and Plan: COVID-19 - Plan: molnupiravir EUA 200 mg CAPS, ondansetron (ZOFRAN) 8 MG tablet Virtual visit for covid 19- phone only Called in medications as above, explained use of these medications  Advised her to seek care at the ER if any distress, contact us if any questions or concerns.   Spoke with pt for 8 minutes   Signed Lamar Blinks, MD

## 2021-01-13 NOTE — Telephone Encounter (Signed)
[facility] called in wanted to know if it's ok to paxlovid, it was ok with Dr.Copland

## 2021-01-15 ENCOUNTER — Encounter: Payer: Medicare Other | Admitting: Obstetrics & Gynecology

## 2021-01-23 ENCOUNTER — Other Ambulatory Visit: Payer: Self-pay | Admitting: *Deleted

## 2021-01-23 MED ORDER — NITROGLYCERIN 0.4 MG SL SUBL: 0.4000 mg | SUBLINGUAL_TABLET | SUBLINGUAL | 3 refills | Status: DC | PRN

## 2021-01-24 MED ORDER — NITROGLYCERIN 0.4 MG SL SUBL: 0.4000 mg | SUBLINGUAL_TABLET | SUBLINGUAL | 3 refills | Status: DC | PRN

## 2021-01-24 MED ORDER — REPATHA SURECLICK 140 MG/ML ~~LOC~~ SOAJ: 1.0000 | SUBCUTANEOUS | 3 refills | Status: DC

## 2021-03-04 DIAGNOSIS — E039 Hypothyroidism, unspecified: Secondary | ICD-10-CM | POA: Diagnosis not present

## 2021-03-10 ENCOUNTER — Other Ambulatory Visit: Payer: Self-pay | Admitting: Family Medicine

## 2021-03-10 ENCOUNTER — Telehealth: Payer: Self-pay | Admitting: Family Medicine

## 2021-03-10 DIAGNOSIS — R32 Unspecified urinary incontinence: Secondary | ICD-10-CM

## 2021-03-10 NOTE — Telephone Encounter (Signed)
Patient stated referral for Urology timed out as well as PCP moved locations. Patient would like for the referral to be resubmitted. Patient advised Michaelle Birks is currently at [facility] in [place].  Address & telephone number for [place]: [address] [place] [address] [phone]

## 2021-03-13 ENCOUNTER — Other Ambulatory Visit: Payer: Self-pay

## 2021-03-13 ENCOUNTER — Ambulatory Visit (INDEPENDENT_AMBULATORY_CARE_PROVIDER_SITE_OTHER): Payer: Medicare Other | Admitting: Urology

## 2021-03-13 ENCOUNTER — Encounter: Payer: Self-pay | Admitting: Urology

## 2021-03-13 VITALS — BP 139/78 | HR 61 | Temp 98.5°F | Ht 63.0 in | Wt 227.8 lb

## 2021-03-13 DIAGNOSIS — R350 Frequency of micturition: Secondary | ICD-10-CM

## 2021-03-13 DIAGNOSIS — N3946 Mixed incontinence: Secondary | ICD-10-CM

## 2021-03-13 LAB — URINALYSIS, ROUTINE W REFLEX MICROSCOPIC
Bilirubin, UA: NEGATIVE
Glucose, UA: NEGATIVE
Ketones, UA: NEGATIVE
Leukocytes,UA: NEGATIVE
Nitrite, UA: NEGATIVE
Protein,UA: NEGATIVE
RBC, UA: NEGATIVE
Specific Gravity, UA: 1.01 (ref 1.005–1.030)
Urobilinogen, Ur: 0.2 mg/dL (ref 0.2–1.0)
pH, UA: 7 (ref 5.0–7.5)

## 2021-03-13 LAB — BLADDER SCAN AMB NON-IMAGING: Scan Result: 115

## 2021-03-13 NOTE — Progress Notes (Signed)
Assessment: 1. Mixed incontinence urge and stress   2. Urine frequency      Plan: Diagnosis and management of mixed urinary incontinence discussed with the patient today.  Options for management of stress incontinence including pelvic floor exercises, biofeedback, incontinence pessaries, and surgical therapy reviewed.  Options for management of urge incontinence including avoidance of dietary irritants, behavioral modification, medical therapy, neuromodulation, and chemodenervation discussed. Bladder diet discussed. Trial of Myrbetriq 50 mg daily.  Samples given.  Use and side effects discussed. Return to office in 4 weeks.  Chief Complaint:  Chief Complaint  Patient presents with   Urinary Incontinence    History of Present Illness:  Destiny [name] is a 70 y.o. year old female who is seen in consultation from Mosie Lukes, MD  for evaluation of urinary incontinence.  She has had symptoms for approximately 7 years with gradual worsening.  She reports urinary frequency, voiding 3-4 times per hour, urgency, and nocturia 1-2 times.  She also has urge incontinence and occasional unaware incontinence.  She does report some leakage with stress incontinence as well.  She is not sure if she empties her bladder completely.  She also reports a decreased force of stream.  She has tried Kegel's in the past.  She is not using any pads at the present time.  No dysuria or gross hematuria.  No recent UTIs.  No prior medical therapy.   Past Medical History:  Past Medical History:  Diagnosis Date   Abnormal blood chemistry    Arthropathy    Back pain    Breast cyst    Cellulitis    Chest discomfort    Chronic bronchitis (HCC)    Constipation    Disturbance of skin sensation    Edema    Encounter for screening and preventative care 06/17/2016   Fatty liver    GERD (gastroesophageal reflux disease)    Hiatal hernia with gastroesophageal reflux 06/17/2016   High risk medication use     Hoarseness 06/15/2016   Hyperglycemia    Hyperglycemia 12/21/2016   Hyperlipidemia    Hyperlipidemia, mixed 06/15/2016   Hypertension    Hypopotassemia    Hypothyroidism    Joint pain    Menopause    Obesity    Palpitations    Positive ANA (antinuclear antibody)    Pre-diabetes    Shoulder pain, right    Skin cancer    Leg   Sleep apnea    denies   SOB (shortness of breath)    Thyroid disease 12/11/2014   Thyroid nodule    Urinary incontinence 04/01/2017   Vitamin D deficiency    Vitamin D deficiency     Past Surgical History:  Past Surgical History:  Procedure Laterality Date   BREAST CYST ASPIRATION Left    30 years ago  1970's or 1980's   INTRAVASCULAR PRESSURE WIRE/FFR STUDY N/A 04/25/2018   Procedure: INTRAVASCULAR PRESSURE WIRE/FFR STUDY;  Surgeon: Jettie Booze, MD;  Location: [facility];  Service: Cardiovascular;  Laterality: N/A;   LEFT HEART CATH AND CORONARY ANGIOGRAPHY N/A 04/25/2018   Procedure: LEFT HEART CATH AND CORONARY ANGIOGRAPHY;  Surgeon: Jettie Booze, MD;  Location: [facility];  Service: Cardiovascular;  Laterality: N/A;   LIPOSUCTION  1990   over gluteal area    VEIN SURGERY Left 2022   WISDOM TOOTH EXTRACTION     and all upper teeth    Allergies:  Allergies  Allergen Reactions   Crestor [  Rosuvastatin Calcium] Other (See Comments)    myalgias   Diclofenac Itching, Nausea Only and Other (See Comments)    Wheezing   Hydrochlorothiazide     Wheezing. Losing teeth.   Statins     Family History:  Family History  Problem Relation Age of Onset   Irregular heart beat Mother    Heart disease Mother    Rheumatic fever Mother    Hypertension Father    Ulcers Father    Hyperlipidemia Father    Stroke Maternal Grandmother    Diabetes Maternal Grandmother    Hypertension Paternal Grandmother    Ulcers Paternal Grandfather     Social History:  Social History   Tobacco Use   Smoking status: Former    Packs/day:  1.00    Years: 27.00    Pack years: 27.00    Types: Cigarettes    Quit date: 2011    Years since quitting: 11.7   Smokeless tobacco: Never  Vaping Use   Vaping Use: Never used  Substance Use Topics   Alcohol use: Yes    Alcohol/week: 0.0 standard drinks   Drug use: No    Review of symptoms:  Constitutional:  Negative for unexplained weight loss, night sweats, fever, chills ENT:  Negative for nose bleeds, sinus pain, painful swallowing CV:  Negative for chest pain, shortness of breath, exercise intolerance, palpitations, loss of consciousness Resp:  Negative for cough, wheezing, shortness of breath GI:  Negative for nausea, vomiting, diarrhea, bloody stools GU:  Positives noted in HPI; otherwise negative for gross hematuria, dysuria Neuro:  Negative for seizures, poor balance, limb weakness, slurred speech Psych:  Negative for lack of energy, depression, anxiety Endocrine:  Negative for polydipsia, polyuria, symptoms of hypoglycemia (dizziness, hunger, sweating) Hematologic:  Negative for anemia, purpura, petechia, prolonged or excessive bleeding, use of anticoagulants  Allergic:  Negative for difficulty breathing or choking as a result of exposure to anything; no shellfish allergy; no allergic response (rash/itch) to materials, foods  Physical exam: BP 139/78   Pulse 61   Temp 98.5 F (36.9 C)   Ht 5\' 3"  (1.6 m)   Wt 227 lb 12.8 oz (103.3 kg)   BMI 40.35 kg/m  GENERAL APPEARANCE:  Well appearing, well developed, well nourished, NAD HEENT: Atraumatic, Normocephalic, oropharynx clear. NECK: Supple without lymphadenopathy or thyromegaly. LUNGS: Clear to auscultation bilaterally. HEART: Regular Rate and Rhythm without murmurs, gallops, or rubs. ABDOMEN: Soft, non-tender, No Masses. EXTREMITIES: Moves all extremities well.  Without clubbing, cyanosis, or edema. NEUROLOGIC:  Alert and oriented x 3, normal gait, CN II-XII grossly intact.  MENTAL STATUS:  Appropriate. BACK:   Non-tender to palpation.  No CVAT SKIN:  Warm, dry and intact.    Results: U/A dipstick:  negative Results for orders placed or performed in visit on 03/13/21 (from the past 24 hour(s))  BLADDER SCAN AMB NON-IMAGING   Collection Time: 03/13/21 11:35 AM  Result Value Ref Range   Scan Result 115

## 2021-03-13 NOTE — Progress Notes (Signed)
PVR 115

## 2021-03-13 NOTE — Progress Notes (Signed)
Urological Symptom Review  Patient is experiencing the following symptoms: Frequent urination Hard to postpone urination Get up at night to urinate Stream starts and stops Weak stream   Review of Systems  Gastrointestinal (upper)  : Negative for upper GI symptoms  Gastrointestinal (lower) : Constipation  Constitutional : Fatigue  Skin: Negative for skin symptoms  Eyes: Negative for eye symptoms  Ear/Nose/Throat : Sinus problems  Hematologic/Lymphatic: Negative for Hematologic/Lymphatic symptoms  Cardiovascular : Leg swelling  Respiratory : Negative for respiratory symptoms  Endocrine: Negative for endocrine symptoms  Musculoskeletal: Negative for musculoskeletal symptoms  Neurological: Negative for neurological symptoms  Psychologic: Negative for psychiatric symptoms

## 2021-03-19 ENCOUNTER — Other Ambulatory Visit (HOSPITAL_COMMUNITY)
Admission: RE | Admit: 2021-03-19 | Discharge: 2021-03-19 | Disposition: A | Discharge status: Home / Self Care | Payer: Medicare Other | Source: Ambulatory Visit | Attending: Obstetrics & Gynecology | Admitting: Obstetrics & Gynecology

## 2021-03-19 ENCOUNTER — Other Ambulatory Visit: Payer: Self-pay

## 2021-03-19 ENCOUNTER — Ambulatory Visit (INDEPENDENT_AMBULATORY_CARE_PROVIDER_SITE_OTHER): Payer: Medicare Other | Admitting: Obstetrics & Gynecology

## 2021-03-19 ENCOUNTER — Encounter: Payer: Self-pay | Admitting: Obstetrics & Gynecology

## 2021-03-19 VITALS — BP 129/58 | HR 66 | Ht 63.0 in | Wt 228.0 lb

## 2021-03-19 DIAGNOSIS — R9389 Abnormal findings on diagnostic imaging of other specified body structures: Secondary | ICD-10-CM

## 2021-03-19 DIAGNOSIS — I2510 Atherosclerotic heart disease of native coronary artery without angina pectoris: Secondary | ICD-10-CM | POA: Diagnosis not present

## 2021-03-19 DIAGNOSIS — Z87891 Personal history of nicotine dependence: Secondary | ICD-10-CM

## 2021-03-19 DIAGNOSIS — Z124 Encounter for screening for malignant neoplasm of cervix: Secondary | ICD-10-CM | POA: Diagnosis not present

## 2021-03-19 MED ORDER — MISOPROSTOL 200 MCG PO TABS: ORAL_TABLET | ORAL | 2 refills | Status: DC

## 2021-03-19 NOTE — Progress Notes (Signed)
Subjective:     Destiny [name] is a 70 y.o. female here for evaluation of thickened endometrium. Pt has an US done or bilateral hip pain and was noted to incidentally have a thickened endometrium. Her LMP was >20 year prev. She reports spotting >10 years ago but, none since. She is unsure of dates but, reports that this occurred many years ago.  She denies unexplained weight loss or other constitutional sx.  Pt reports a h/o tobacco use in the past. None for >10 years. Pt denies abdominal pain.    Gynecologic History No LMP recorded. Patient is postmenopausal. Contraception: post menopausal status Last Pap: unsure.   Obstetric History OB History  Gravida Para Term Preterm AB Living  1 0     1    SAB IAB Ectopic Multiple Live Births  1            # Outcome Date GA Lbr Len/2nd Weight Sex Delivery Anes PTL Lv  1 SAB 1999            The following portions of the patient's history were reviewed and updated as appropriate: allergies, current medications, past family history, past medical history, past social history, past surgical history, and problem list.  Review of Systems Pertinent items are noted in HPI.    Objective:  BP (!) 129/58   Pulse 66   Ht 5\' 3"  (1.6 m)   Wt 228 lb (103.4 kg)   BMI 40.39 kg/m   CONSTITUTIONAL: Well-developed, well-nourished female in no acute distress.  HENT:  Normocephalic, atraumatic EYES: Conjunctivae and EOM are normal. No scleral icterus.  NECK: Normal range of motion SKIN: Skin is warm and dry. No rash noted. Not diaphoretic.No pallor. [place]: Alert and oriented to person, place, and time. Normal coordination.  Abd: obses; NT, ND. Well healed port sites.  EGBUS: no lesions Vagina: no blood in vault Cervix: no lesion; no mucopurulent d/c Uterus: small, mobile; difficult to fully assess due to body habitus.   Adnexa: no masses; non tender    04/12/2018  CLINICAL DATA:  Bilateral lower quadrant pain. History of ovarian cysts.  Postmenopausal female.   EXAM: TRANSABDOMINAL AND TRANSVAGINAL ULTRASOUND OF PELVIS   TECHNIQUE: Both transabdominal and transvaginal ultrasound examinations of the pelvis were performed. Transabdominal technique was performed for global imaging of the pelvis including uterus, ovaries, adnexal regions, and pelvic cul-de-sac. It was necessary to proceed with endovaginal exam following the transabdominal exam to visualize the endometrium and ovaries.   COMPARISON:  None   FINDINGS: Uterus   Measurements: 6.8 x 3.1 x 4.1 cm = volume: 44.9 mL. No fibroids or other mass visualized.   Endometrium   Thickness: 8 mm. Heterogeneous echogenicity noted, although no focal abnormality visualized.   Right ovary   Measurements: 2.2 x 1.6 x 2.4 cm = volume: 4.5 mL. Normal appearance/no adnexal mass.   Left ovary   Measurements: 2.3 x 1.3 x 2.5 cm = volume: 4.0 mL. Normal appearance/no adnexal mass.   Other findings   No abnormal free fluid.   IMPRESSION: Endometrial thickness measures 8 mm. Endometrial thickness is considered abnormal for an asymptomatic post-menopausal female. Endometrial sampling should be considered to exclude carcinoma.   Normal appearance of both ovaries.  No adnexal mass identified.   ASSESSMENT/PLAN:   Diagnoses and all orders for this visit:  Thickened endometrium -     US Transvaginal Non-OB; Future -     misoprostol (CYTOTEC) 200 MCG tablet; Place two tablets in the vagina  the night prior to your next clinic appointment  Cervical cancer screening -     Cytology - PAP( [facility])  Personal history of tobacco use -     Cytology - PAP( [place] [facility])   If endometrium abnormal will scheduled Endo bx. Cytotec ordered for pt to take prior to endobx if needed.   Total face-to-face time with patient, review of chart, discussion with consultant and coordination of care was 23min.  We discussed management of thickened endometrium and etiology. Work up  with next steps.      Destiny [name], M.D., Cherlynn June

## 2021-03-19 NOTE — Progress Notes (Signed)
Patient states that she had ultrasound in 2019 that showed endometrial thickening and patient having pelvic pain.Patient referred from Dr. Randel Pigg. Patient sees Dr. Joaquim Lai Kind for bladder issues.  Kathrene Alu RN

## 2021-03-20 ENCOUNTER — Ambulatory Visit (HOSPITAL_BASED_OUTPATIENT_CLINIC_OR_DEPARTMENT_OTHER)
Admission: RE | Admit: 2021-03-20 | Discharge: 2021-03-20 | Disposition: A | Discharge status: Home / Self Care | Payer: Medicare Other | Source: Ambulatory Visit | Attending: Obstetrics & Gynecology | Admitting: Obstetrics & Gynecology

## 2021-03-20 DIAGNOSIS — Z780 Asymptomatic menopausal state: Secondary | ICD-10-CM | POA: Diagnosis not present

## 2021-03-20 DIAGNOSIS — N888 Other specified noninflammatory disorders of cervix uteri: Secondary | ICD-10-CM | POA: Diagnosis not present

## 2021-03-20 DIAGNOSIS — R9389 Abnormal findings on diagnostic imaging of other specified body structures: Secondary | ICD-10-CM | POA: Diagnosis not present

## 2021-03-21 LAB — CYTOLOGY - PAP: Diagnosis: NEGATIVE

## 2021-04-03 ENCOUNTER — Other Ambulatory Visit: Payer: Self-pay | Admitting: Family Medicine

## 2021-04-03 NOTE — Telephone Encounter (Signed)
Last vit d checked 04/18/20 and was 38.39

## 2021-04-14 ENCOUNTER — Telehealth: Payer: Self-pay

## 2021-04-14 NOTE — Telephone Encounter (Signed)
No answer from patient phone call and sent my chart message. Kathrene Alu RN

## 2021-04-14 NOTE — Telephone Encounter (Signed)
-----   Message from Lavonia Drafts, MD sent at 04/11/2021  9:29 AM EDT ----- Please call pt. Her endometrial stripe is thickened on [place]  and she does need an endo bx. I gave her a Rx for Cytotec and she needs to take that prior to her visit.   Thx,  Clh-S

## 2021-04-15 ENCOUNTER — Other Ambulatory Visit: Payer: Self-pay

## 2021-04-15 ENCOUNTER — Encounter: Payer: Self-pay | Admitting: Family Medicine

## 2021-04-15 ENCOUNTER — Ambulatory Visit (INDEPENDENT_AMBULATORY_CARE_PROVIDER_SITE_OTHER): Payer: Medicare Other | Admitting: Family Medicine

## 2021-04-15 VITALS — BP 132/66 | HR 76 | Temp 97.8°F | Resp 16 | Wt 228.6 lb

## 2021-04-15 DIAGNOSIS — I10 Essential (primary) hypertension: Secondary | ICD-10-CM

## 2021-04-15 DIAGNOSIS — E039 Hypothyroidism, unspecified: Secondary | ICD-10-CM

## 2021-04-15 DIAGNOSIS — Z23 Encounter for immunization: Secondary | ICD-10-CM | POA: Diagnosis not present

## 2021-04-15 DIAGNOSIS — K5900 Constipation, unspecified: Secondary | ICD-10-CM

## 2021-04-15 DIAGNOSIS — E538 Deficiency of other specified B group vitamins: Secondary | ICD-10-CM

## 2021-04-15 DIAGNOSIS — K760 Fatty (change of) liver, not elsewhere classified: Secondary | ICD-10-CM

## 2021-04-15 DIAGNOSIS — E559 Vitamin D deficiency, unspecified: Secondary | ICD-10-CM

## 2021-04-15 DIAGNOSIS — E782 Mixed hyperlipidemia: Secondary | ICD-10-CM | POA: Diagnosis not present

## 2021-04-15 DIAGNOSIS — R739 Hyperglycemia, unspecified: Secondary | ICD-10-CM | POA: Diagnosis not present

## 2021-04-15 DIAGNOSIS — I2510 Atherosclerotic heart disease of native coronary artery without angina pectoris: Secondary | ICD-10-CM

## 2021-04-15 DIAGNOSIS — N8500 Endometrial hyperplasia, unspecified: Secondary | ICD-10-CM | POA: Diagnosis not present

## 2021-04-15 MED ORDER — IRBESARTAN 150 MG PO TABS: 150.0000 mg | ORAL_TABLET | Freq: Every day | ORAL | 1 refills | Status: DC

## 2021-04-15 MED ORDER — DOCUSATE SODIUM 100 MG PO CAPS: 100.0000 mg | ORAL_CAPSULE | Freq: Two times a day (BID) | ORAL | 1 refills | Status: DC | PRN

## 2021-04-15 MED ORDER — METOPROLOL SUCCINATE ER 25 MG PO TB24: 25.0000 mg | ORAL_TABLET | Freq: Every day | ORAL | 1 refills | Status: DC

## 2021-04-15 NOTE — Assessment & Plan Note (Signed)
Supplement and monitor 

## 2021-04-15 NOTE — Assessment & Plan Note (Signed)
hgba1c acceptable, minimize simple carbs. Increase exercise as tolerated.  

## 2021-04-15 NOTE — Assessment & Plan Note (Signed)
Minimize simple carbs, processed foods and animal fats, increase activity monitor liver functions. Labs ordered today

## 2021-04-15 NOTE — Assessment & Plan Note (Signed)
On Levothyroxine, continue to monitor 

## 2021-04-15 NOTE — Assessment & Plan Note (Signed)
She will undergo endometrial biopsy later this week

## 2021-04-15 NOTE — Assessment & Plan Note (Signed)
Encouraged DASH or MIND diet, decrease po intake and increase exercise as tolerated. Needs 7-8 hours of sleep nightly. Avoid trans fats, eat small, frequent meals every 4-5 hours with lean proteins, complex carbs and healthy fats. Minimize simple carbs, high fat foods and processed foods 

## 2021-04-15 NOTE — Assessment & Plan Note (Signed)
Following with carediology

## 2021-04-15 NOTE — Progress Notes (Signed)
Patient ID: Destiny [name], female    DOB: 03/02/1951  Age: 70 y.o. MRN: [id number]    Subjective:   No chief complaint on file.  Subjective   HPI Destiny [name] presents for office visit today for follow up on HTN and CAD. She states that she takes reg'activ supplements for fatty liver disease and takes decussate once daily for her constipation. Denies CP/palp/SOB/HA/congestion/fevers or GU c/o. Taking meds as prescribed.   Review of Systems  Constitutional:  Negative for chills, fatigue and fever.  HENT:  Negative for congestion, rhinorrhea, sinus pressure, sinus pain and sore throat.   Eyes:  Negative for pain.  Respiratory:  Negative for cough and shortness of breath.   Cardiovascular:  Negative for chest pain, palpitations and leg swelling.  Gastrointestinal:  Positive for constipation. Negative for abdominal pain, blood in stool, diarrhea, nausea and vomiting.  Genitourinary:  Negative for decreased urine volume, flank pain, frequency, vaginal bleeding and vaginal discharge.  Musculoskeletal:  Positive for myalgias. Negative for back pain.  Neurological:  Negative for headaches.   History Past Medical History:  Diagnosis Date   Abnormal blood chemistry    Arthropathy    Back pain    Breast cyst    Cellulitis    Chest discomfort    Chronic bronchitis (HCC)    Constipation    Disturbance of skin sensation    Edema    Encounter for screening and preventative care 06/17/2016   Fatty liver    GERD (gastroesophageal reflux disease)    Hiatal hernia with gastroesophageal reflux 06/17/2016   High risk medication use    Hoarseness 06/15/2016   Hyperglycemia    Hyperglycemia 12/21/2016   Hyperlipidemia    Hyperlipidemia, mixed 06/15/2016   Hypertension    Hypopotassemia    Hypothyroidism    Joint pain    Menopause    Obesity    Palpitations    Positive ANA (antinuclear antibody)    Pre-diabetes    Shoulder pain, right    Skin cancer    Leg   Sleep apnea    denies    SOB (shortness of breath)    Thyroid disease 12/11/2014   Thyroid nodule    Urinary incontinence 04/01/2017   Vitamin D deficiency    Vitamin D deficiency     She has a past surgical history that includes Liposuction (1990); Wisdom tooth extraction; Breast cyst aspiration (Left); LEFT HEART CATH AND CORONARY ANGIOGRAPHY (N/A, 04/25/2018); INTRAVASCULAR PRESSURE WIRE/FFR STUDY (N/A, 04/25/2018); and Vein Surgery (Left, 2022).   Her family history includes Diabetes in her maternal grandmother; Heart disease in her mother; Hyperlipidemia in her father; Hypertension in her father and paternal grandmother; Irregular heart beat in her mother; Rheumatic fever in her mother; Stroke in her maternal grandmother; Ulcers in her father and paternal grandfather.She reports that she quit smoking about 11 years ago. Her smoking use included cigarettes. She has a 27.00 pack-year smoking history. She has never used smokeless tobacco. She reports current alcohol use. She reports that she does not use drugs.  Current Outpatient Medications on File Prior to Visit  Medication Sig Dispense Refill   albuterol (VENTOLIN HFA) 108 (90 Base) MCG/ACT inhaler Inhale 2 puffs into the lungs every 6 (six) hours as needed for wheezing or shortness of breath. 18 g 5   aspirin EC 81 MG tablet Take 1 tablet (81 mg total) by mouth daily. 90 tablet 3   benzonatate (TESSALON) 100 MG capsule Take 1 capsule (100 mg  total) by mouth 2 (two) times daily as needed for cough. 20 capsule 1   betamethasone dipropionate 0.05 % cream Apply 1 application topically 2 (two) times daily as needed (rash).     co-enzyme Q-10 50 MG capsule Take 50 mg by mouth daily.     Evolocumab (REPATHA SURECLICK) 492 MG/ML SOAJ Inject 1 Dose into the skin every 14 (fourteen) days. 6 mL 3   mirabegron ER (MYRBETRIQ) 25 MG TB24 tablet Take 25 mg by mouth daily.     nitroGLYCERIN (NITROSTAT) 0.4 MG SL tablet Place 1 tablet (0.4 mg total) under the tongue every 5 (five)  minutes as needed for chest pain. 25 tablet 3   thyroid (ARMOUR) 30 MG tablet Take 30 mg by mouth.     thyroid (ARMOUR) 90 MG tablet Take 90 mg by mouth daily.     Zinc 100 MG TABS Take 100 mg by mouth daily.     misoprostol (CYTOTEC) 200 MCG tablet Place two tablets in the vagina the night prior to your next clinic appointment 2 tablet 2   Vitamin D, Ergocalciferol, (DRISDOL) 1.25 MG (50000 UNIT) CAPS capsule TAKE ONE CAPSULE BY MOUTH EVERY SEVEN DAYS (MAY CONTAIN SOY OR PEANUT--TALK TO YOUR DOCTOR BEFORE TAKING IF YOU ARE ALLERGIC) 12 capsule 3   No current facility-administered medications on file prior to visit.     Objective:  Objective  Physical Exam Constitutional:      General: She is not in acute distress.    Appearance: Normal appearance. She is not ill-appearing or toxic-appearing.  HENT:     Head: Normocephalic and atraumatic.     Right Ear: Tympanic membrane, ear canal and external ear normal.     Left Ear: Tympanic membrane, ear canal and external ear normal.     Nose: No congestion or rhinorrhea.  Eyes:     Extraocular Movements: Extraocular movements intact.     Pupils: Pupils are equal, round, and reactive to light.  Cardiovascular:     Rate and Rhythm: Normal rate and regular rhythm.     Pulses: Normal pulses.     Heart sounds: Normal heart sounds. No murmur heard. Pulmonary:     Effort: Pulmonary effort is normal. No respiratory distress.     Breath sounds: Normal breath sounds. No wheezing, rhonchi or rales.  Abdominal:     General: Bowel sounds are normal.     Palpations: Abdomen is soft. There is no mass.     Tenderness: There is no abdominal tenderness. There is no guarding.     Hernia: No hernia is present.  Musculoskeletal:        General: Normal range of motion.     Cervical back: Normal range of motion and neck supple.  Skin:    General: Skin is warm and dry.  Neurological:     Mental Status: She is alert and oriented to person, place, and time.   Psychiatric:        Behavior: Behavior normal.   BP 132/66   Pulse 76   Temp 97.8 F (36.6 C)   Resp 16   Wt 228 lb 9.6 oz (103.7 kg)   SpO2 99%   BMI 40.49 kg/m  Wt Readings from Last 3 Encounters:  04/15/21 228 lb 9.6 oz (103.7 kg)  03/19/21 228 lb (103.4 kg)  03/13/21 227 lb 12.8 oz (103.3 kg)     Lab Results  Component Value Date   WBC 9.0 08/22/2020   HGB 14.8 08/22/2020  HCT 43.7 08/22/2020   PLT 235 08/22/2020   GLUCOSE 146 (H) 08/22/2020   CHOL 128 11/15/2020   TRIG 211 (H) 11/15/2020   HDL 38 (L) 11/15/2020   LDLDIRECT 71.0 04/18/2020   LDLCALC 56 11/15/2020   ALT 34 08/22/2020   AST 28 08/22/2020   NA 134 (L) 08/22/2020   K 3.9 08/22/2020   CL 100 08/22/2020   CREATININE 0.65 08/22/2020   BUN 11 08/22/2020   CO2 21 (L) 08/22/2020   TSH 6.18 (H) 10/06/2019   HGBA1C 5.8 (H) 09/04/2020    US Transvaginal Non-OB  Result Date: 03/21/2021 CLINICAL DATA:  Follow-up thickened endometrial complex, postmenopausal EXAM: ULTRASOUND PELVIS TRANSVAGINAL TECHNIQUE: Transvaginal ultrasound examination of the pelvis was performed including evaluation of the uterus, ovaries, adnexal regions, and pelvic cul-de-sac. COMPARISON:  04/12/2018 FINDINGS: Uterus Measurements: 5.9 x 2.9 x 3.7 cm = volume: 33 mL. Anteflexed. Normal morphology without mass. Nabothian cysts at cervix. Endometrium Thickness: 9 mm. Heterogeneous. Minimal cystic changes. No discrete mass or endometrial fluid. Right ovary Not visualized, likely obscured by bowel Left ovary Not visualized, likely obscured by bowel Other findings:  No free pelvic fluid.  No adnexal masses. IMPRESSION: Nonvisualization of ovaries. Endometrial complex measures 9 mm thick, mildly heterogeneous with minimal cystic changes. Endometrial thickness is considered abnormal for an asymptomatic post-menopausal female. Endometrial sampling should be considered to exclude carcinoma. Electronically Signed   By: Lavonia Dana M.D.   On:  03/21/2021 09:36     Assessment & Plan:  Plan    Meds ordered this encounter  Medications   irbesartan (AVAPRO) 150 MG tablet    Sig: Take 1 tablet (150 mg total) by mouth daily.    Dispense:  90 tablet    Refill:  1   metoprolol succinate (TOPROL-XL) 25 MG 24 hr tablet    Sig: Take 1 tablet (25 mg total) by mouth daily.    Dispense:  90 tablet    Refill:  1   docusate sodium (DOK) 100 MG capsule    Sig: Take 1 capsule (100 mg total) by mouth 2 (two) times daily as needed for mild constipation.    Dispense:  180 capsule    Refill:  1    Problem List Items Addressed This Visit     Hypothyroidism (Chronic)    On Levothyroxine, continue to monitor      Relevant Medications   metoprolol succinate (TOPROL-XL) 25 MG 24 hr tablet   Essential hypertension    Well controlled, no changes to meds. Encouraged heart healthy diet such as the DASH diet and exercise as tolerated.       Relevant Medications   irbesartan (AVAPRO) 150 MG tablet   metoprolol succinate (TOPROL-XL) 25 MG 24 hr tablet   Other Relevant Orders   CBC   Hyperglycemia    hgba1c acceptable, minimize simple carbs. Increase exercise as tolerated.       Relevant Orders   Comprehensive metabolic panel   Hemoglobin A1c   Mixed hyperlipidemia   Relevant Medications   irbesartan (AVAPRO) 150 MG tablet   metoprolol succinate (TOPROL-XL) 25 MG 24 hr tablet   Vitamin D deficiency    Supplement and monitor      Relevant Orders   VITAMIN D 25 Hydroxy (Vit-D Deficiency, Fractures)   Morbid obesity ([place])    Encouraged DASH or MIND diet, decrease po intake and increase exercise as tolerated. Needs 7-8 hours of sleep nightly. Avoid trans fats, eat small, frequent meals every 4-5  hours with lean proteins, complex carbs and healthy fats. Minimize simple carbs, high fat foods and processed foods      Constipation    Encouraged increased hydration and fiber in diet. Daily probiotics. If bowels not moving can use MOM 2  tbls po in 4 oz of warm prune juice by mouth every 2-3 days. If no results then repeat in 4 hours with  Dulcolax suppository pr, may repeat again in 4 more hours as needed. Seek care if symptoms worsen. Consider daily Miralax and/or Dulcolax if symptoms persist. She has found Docusate helpful so she is given a refill on this today      Vitamin B12 deficiency    Supplement and monitor      Endometrium, hyperplasia    She will undergo endometrial biopsy later this week      CAD (coronary artery disease)    Following with carediology      Relevant Medications   irbesartan (AVAPRO) 150 MG tablet   metoprolol succinate (TOPROL-XL) 25 MG 24 hr tablet   Fatty liver disease, nonalcoholic    Minimize simple carbs, processed foods and animal fats, increase activity monitor liver functions. Labs ordered today      Other Visit Diagnoses     Need for influenza vaccination    -  Primary   Relevant Orders   Flu Vaccine QUAD High Dose(Fluad) (Completed)       Follow-up: Return in about 6 months (around 10/13/2021) for annual exam.  I, Suezanne Jacquet, acting as a scribe for Penni Homans, MD, have documented all relevent documentation on behalf of Penni Homans, MD, as directed by Penni Homans, MD while in the presence of Penni Homans, MD. DO:04/15/21.  I, Mosie Lukes, MD personally performed the services described in this documentation. All medical record entries made by the scribe were at my direction and in my presence. I have reviewed the chart and agree that the record reflects my personal performance and is accurate and complete

## 2021-04-15 NOTE — Assessment & Plan Note (Signed)
Encouraged increased hydration and fiber in diet. Daily probiotics. If bowels not moving can use MOM 2 tbls po in 4 oz of warm prune juice by mouth every 2-3 days. If no results then repeat in 4 hours with  Dulcolax suppository pr, may repeat again in 4 more hours as needed. Seek care if symptoms worsen. Consider daily Miralax and/or Dulcolax if symptoms persist. She has found Docusate helpful so she is given a refill on this today

## 2021-04-15 NOTE — Assessment & Plan Note (Signed)
Well controlled, no changes to meds. Encouraged heart healthy diet such as the DASH diet and exercise as tolerated.  °

## 2021-04-15 NOTE — Patient Instructions (Addendum)
Encouraged increased hydration and fiber in diet. Daily probiotics. If bowels not moving can use MOM 2 tbls po in 4 oz of warm prune juice by mouth every 2-3 days. If no results then repeat in 4 hours with  Dulcolax suppository pr, may repeat again in 4 more hours as needed. Seek care if symptoms worsen. Consider daily Miralax and/or Dulcolax if symptoms persist.   Molnupiravir/Paxlovid is the new COVID medication we can give you if you get COVID so make sure you test if you have symptoms because we have to treat by day 5 of symptoms for it to be effective. If you are positive let us know so we can treat. If a home test is negative and your symptoms are persistent get a PCR test. Can check testing locations at [email or web address] If you are positive we will make an appointment with [place] and we will send in molnupiravir/paxlovid if you would like it. Check with your pharmacy before we meet to confirm they have it in stock, if they do not then we can get the prescription at the [facility].   Please contact your insurance to check coverage for the Shingrix vaccine to protect you against shingles. This is a 2 dose series.  One shot followed by a second shot 2-6 months later.  If this vaccine is covered by your insurance, please contact our office to schedule a nurse visit and we will be happy to give it to you.

## 2021-04-16 ENCOUNTER — Encounter: Payer: Self-pay | Admitting: General Practice

## 2021-04-16 ENCOUNTER — Other Ambulatory Visit (HOSPITAL_COMMUNITY)
Admission: RE | Admit: 2021-04-16 | Discharge: 2021-04-16 | Disposition: A | Discharge status: Home / Self Care | Payer: Medicare Other | Source: Ambulatory Visit | Attending: Obstetrics & Gynecology | Admitting: Obstetrics & Gynecology

## 2021-04-16 ENCOUNTER — Encounter: Payer: Self-pay | Admitting: Obstetrics & Gynecology

## 2021-04-16 ENCOUNTER — Ambulatory Visit (INDEPENDENT_AMBULATORY_CARE_PROVIDER_SITE_OTHER): Payer: Medicare Other | Admitting: Obstetrics & Gynecology

## 2021-04-16 VITALS — BP 154/59 | HR 74 | Ht 63.0 in | Wt 228.0 lb

## 2021-04-16 DIAGNOSIS — R102 Pelvic and perineal pain: Secondary | ICD-10-CM | POA: Diagnosis not present

## 2021-04-16 DIAGNOSIS — R9389 Abnormal findings on diagnostic imaging of other specified body structures: Secondary | ICD-10-CM | POA: Insufficient documentation

## 2021-04-16 LAB — VITAMIN D 25 HYDROXY (VIT D DEFICIENCY, FRACTURES): VITD: 37.75 ng/mL (ref 30.00–100.00)

## 2021-04-16 LAB — COMPREHENSIVE METABOLIC PANEL
ALT: 25 U/L (ref 0–35)
AST: 20 U/L (ref 0–37)
Albumin: 4.4 g/dL (ref 3.5–5.2)
Alkaline Phosphatase: 67 U/L (ref 39–117)
BUN: 16 mg/dL (ref 6–23)
CO2: 29 mEq/L (ref 19–32)
Calcium: 9.5 mg/dL (ref 8.4–10.5)
Chloride: 100 mEq/L (ref 96–112)
Creatinine, Ser: 0.87 mg/dL (ref 0.40–1.20)
GFR: 68.39 mL/min (ref >60.00)
Glucose, Bld: 96 mg/dL (ref 70–99)
Potassium: 4.4 mEq/L (ref 3.5–5.1)
Sodium: 137 mEq/L (ref 135–145)
Total Bilirubin: 0.4 mg/dL (ref 0.2–1.2)
Total Protein: 7 g/dL (ref 6.0–8.3)

## 2021-04-16 LAB — CBC
HCT: 41.1 % (ref 36.0–46.0)
Hemoglobin: 13.8 g/dL (ref 12.0–15.0)
MCHC: 33.4 g/dL (ref 30.0–36.0)
MCV: 90.7 fl (ref 78.0–100.0)
Platelets: 270 10*3/uL (ref 150.0–400.0)
RBC: 4.53 Mil/uL (ref 3.87–5.11)
RDW: 13.4 % (ref 11.5–15.5)
WBC: 5.2 10*3/uL (ref 4.0–10.5)

## 2021-04-16 LAB — HEMOGLOBIN A1C: Hgb A1c MFr Bld: 5.9 % (ref 4.6–6.5)

## 2021-04-16 NOTE — Progress Notes (Signed)
History:  70 y.o. G1P0010 here today for eval of thickened endometrium on imaging. Pt denies PMPB. She has pelvic cramping that has been present since age 61 years. Pt denies new onset of sx. She took Cytotec prior to this visit.   The following portions of the patient's history were reviewed and updated as appropriate: allergies, current medications, past family history, past medical history, past social history, past surgical history and problem list.  Review of Systems:  Pertinent items are noted in HPI.    Objective:  Physical Exam Blood pressure (!) 154/59, pulse 74, height 5\' 3"  (1.6 m), weight 228 lb (103.4 kg).  CONSTITUTIONAL: Well-developed, well-nourished female in no acute distress.  HENT:  Normocephalic, atraumatic EYES: Conjunctivae and EOM are normal. No scleral icterus.  NECK: Normal range of motion SKIN: Skin is warm and dry. No rash noted. Not diaphoretic.No pallor. [place]: Alert and oriented to person, place, and time. Normal coordination.  Abd: Soft, nontender and nondistended Pelvic: Normal appearing external genitalia; normal appearing vaginal mucosa and cervix.  Normal discharge.  Small uterus, no other palpable masses, no uterine or adnexal tenderness  GYN procedure:  The indications for endometrial biopsy were reviewed.   Risks of the biopsy including cramping, bleeding, infection, uterine perforation, inadequate specimen and need for additional procedures  were discussed. The patient states she understands and agrees to undergo procedure today. Consent was signed. Time out was performed. Urine HCG was negative. A sterile speculum was placed in the patient's vagina and the cervix was prepped with Betadine. A single-toothed tenaculum was placed on the anterior lip of the cervix to stabilize it. The 3 mm pipelle was introduced into the endometrial cavity without difficulty to a depth of 8cm, and a moderate amount of tissue was obtained and sent to pathology. The  instruments were removed from the patient's vagina. Minimal bleeding from the cervix was noted. The patient tolerated the procedure well. Routine post-procedure instructions were given to the patient. The patient will follow up to review the results and for further management.     Labs and Imaging US Transvaginal Non-OB  Result Date: 03/21/2021 CLINICAL DATA:  Follow-up thickened endometrial complex, postmenopausal EXAM: ULTRASOUND PELVIS TRANSVAGINAL TECHNIQUE: Transvaginal ultrasound examination of the pelvis was performed including evaluation of the uterus, ovaries, adnexal regions, and pelvic cul-de-sac. COMPARISON:  04/12/2018 FINDINGS: Uterus Measurements: 5.9 x 2.9 x 3.7 cm = volume: 33 mL. Anteflexed. Normal morphology without mass. Nabothian cysts at cervix. Endometrium Thickness: 9 mm. Heterogeneous. Minimal cystic changes. No discrete mass or endometrial fluid. Right ovary Not visualized, likely obscured by bowel Left ovary Not visualized, likely obscured by bowel Other findings:  No free pelvic fluid.  No adnexal masses. IMPRESSION: Nonvisualization of ovaries. Endometrial complex measures 9 mm thick, mildly heterogeneous with minimal cystic changes. Endometrial thickness is considered abnormal for an asymptomatic post-menopausal female. Endometrial sampling should be considered to exclude carcinoma. Electronically Signed   By: Lavonia Dana M.D.   On: 03/21/2021 09:36    Assessment & Plan:  Diagnoses and all orders for this visit:  Pelvic cramping -     Surgical pathology  Thickened endometrium -     Surgical pathology   F/u for results via telephone. F/u for annual in Oct 2023 or sooner prn.  Maximos Zayas L. Harraway-Smith, M.D., Cherlynn June

## 2021-04-17 ENCOUNTER — Ambulatory Visit (INDEPENDENT_AMBULATORY_CARE_PROVIDER_SITE_OTHER): Payer: Medicare Other | Admitting: Urology

## 2021-04-17 ENCOUNTER — Encounter: Payer: Self-pay | Admitting: Urology

## 2021-04-17 ENCOUNTER — Other Ambulatory Visit: Payer: Self-pay

## 2021-04-17 VITALS — BP 127/78 | HR 65 | Temp 97.7°F | Wt 227.0 lb

## 2021-04-17 DIAGNOSIS — R350 Frequency of micturition: Secondary | ICD-10-CM | POA: Diagnosis not present

## 2021-04-17 DIAGNOSIS — N3946 Mixed incontinence: Secondary | ICD-10-CM

## 2021-04-17 LAB — URINALYSIS, ROUTINE W REFLEX MICROSCOPIC
Bilirubin, UA: NEGATIVE
Glucose, UA: NEGATIVE
Ketones, UA: NEGATIVE
Leukocytes,UA: NEGATIVE
Nitrite, UA: NEGATIVE
Protein,UA: NEGATIVE
RBC, UA: NEGATIVE
Specific Gravity, UA: 1.02 (ref 1.005–1.030)
Urobilinogen, Ur: 1 mg/dL (ref 0.2–1.0)
pH, UA: 7 (ref 5.0–7.5)

## 2021-04-17 MED ORDER — SOLIFENACIN SUCCINATE 10 MG PO TABS: 10.0000 mg | ORAL_TABLET | Freq: Every day | ORAL | 11 refills | Status: DC

## 2021-04-17 NOTE — Progress Notes (Signed)
Assessment: 1. Mixed incontinence urge and stress   2. Urine frequency      Plan: Continue bladder diet. Trial of Vesicare 10 mg daily.  Prescription sent.  Use and side effects discussed. Patient advised to contact me with results of medication trial in approximately 1 month. Discussed further evaluation with urodynamics if she does not see any improvement with the medication.  Chief Complaint:  Chief Complaint  Patient presents with   Urinary Incontinence     History of Present Illness:  Destiny [name] is a 70 y.o. year old female who is seen for further evaluation of urinary incontinence.  At her initial visit in 10/22, she reported symptoms for approximately 7 years with gradual worsening.  She reported urinary frequency, voiding 3-4 times per hour, urgency, nocturia 1-2 times, urge incontinence and occasional unaware incontinence.  She reported some leakage with stress incontinence as well.  She also reported a decreased force of stream.  She had tried Kegel's in the past.  She was not using any pads.  No dysuria or gross hematuria.  No recent UTIs.  No prior medical therapy. PVR = 115 mL She was given a trial of Myrbetriq 50 mg daily.  She returns today for follow-up.  She has not seen any improvement in her urinary symptoms with the Myrbetriq.  She continues to have urgency, frequency, and urge incontinence.  No dysuria or gross hematuria.  No side effects from the medication.  Portions of the above documentation were copied from a prior visit for review purposes only.    Past Medical History:  Past Medical History:  Diagnosis Date   Abnormal blood chemistry    Arthropathy    Back pain    Breast cyst    Cellulitis    Chest discomfort    Chronic bronchitis (HCC)    Constipation    Disturbance of skin sensation    Edema    Encounter for screening and preventative care 06/17/2016   Fatty liver    GERD (gastroesophageal reflux disease)    Hiatal hernia with  gastroesophageal reflux 06/17/2016   High risk medication use    Hoarseness 06/15/2016   Hyperglycemia    Hyperglycemia 12/21/2016   Hyperlipidemia    Hyperlipidemia, mixed 06/15/2016   Hypertension    Hypopotassemia    Hypothyroidism    Joint pain    Menopause    Obesity    Palpitations    Positive ANA (antinuclear antibody)    Pre-diabetes    Shoulder pain, right    Skin cancer    Leg   Sleep apnea    denies   SOB (shortness of breath)    Thyroid disease 12/11/2014   Thyroid nodule    Urinary incontinence 04/01/2017   Vitamin D deficiency    Vitamin D deficiency     Past Surgical History:  Past Surgical History:  Procedure Laterality Date   BREAST CYST ASPIRATION Left    30 years ago  1970's or 1980's   INTRAVASCULAR PRESSURE WIRE/FFR STUDY N/A 04/25/2018   Procedure: INTRAVASCULAR PRESSURE WIRE/FFR STUDY;  Surgeon: Jettie Booze, MD;  Location: [facility];  Service: Cardiovascular;  Laterality: N/A;   LEFT HEART CATH AND CORONARY ANGIOGRAPHY N/A 04/25/2018   Procedure: LEFT HEART CATH AND CORONARY ANGIOGRAPHY;  Surgeon: Jettie Booze, MD;  Location: [facility];  Service: Cardiovascular;  Laterality: N/A;   LIPOSUCTION  1990   over gluteal area    VEIN SURGERY Left 2022  WISDOM TOOTH EXTRACTION     and all upper teeth    Allergies:  Allergies  Allergen Reactions   Crestor [Rosuvastatin Calcium] Other (See Comments)    myalgias   Diclofenac Itching, Nausea Only and Other (See Comments)    Wheezing   Hydrochlorothiazide     Wheezing. Losing teeth.   Statins     Family History:  Family History  Problem Relation Age of Onset   Irregular heart beat Mother    Heart disease Mother    Rheumatic fever Mother    Hypertension Father    Ulcers Father    Hyperlipidemia Father    Stroke Maternal Grandmother    Diabetes Maternal Grandmother    Hypertension Paternal Grandmother    Ulcers Paternal Grandfather     Social History:  Social  History   Tobacco Use   Smoking status: Former    Packs/day: 1.00    Years: 27.00    Pack years: 27.00    Types: Cigarettes    Quit date: 2011    Years since quitting: 11.8   Smokeless tobacco: Never  Vaping Use   Vaping Use: Never used  Substance Use Topics   Alcohol use: Yes    Alcohol/week: 0.0 standard drinks   Drug use: No    ROS: Constitutional:  Negative for fever, chills, weight loss CV: Negative for chest pain, previous MI, hypertension Respiratory:  Negative for shortness of breath, wheezing, sleep apnea, frequent cough GI:  Negative for nausea, vomiting, bloody stool, GERD  Physical exam: BP 127/78   Pulse 65   Temp 97.7 F (36.5 C)   Wt 227 lb (103 kg)   BMI 40.21 kg/m  GENERAL APPEARANCE:  Well appearing, well developed, well nourished, NAD HEENT:  Atraumatic, normocephalic, oropharynx clear NECK:  Supple without lymphadenopathy or thyromegaly ABDOMEN:  Soft, non-tender, no masses EXTREMITIES:  Moves all extremities well, without clubbing, cyanosis, or edema NEUROLOGIC:  Alert and oriented x 3, normal gait, CN II-XII grossly intact MENTAL STATUS:  appropriate BACK:  Non-tender to palpation, No CVAT SKIN:  Warm, dry, and intact  Results: U/A:  dipstick negative

## 2021-04-17 NOTE — Progress Notes (Signed)
Urological Symptom Review  Patient is experiencing the following symptoms: Hard to postpone urination Get up at night to urinate Leakage of urine Stream starts and stops Weak stream   Review of Systems  Gastrointestinal (upper)  : Negative for upper GI symptoms  Gastrointestinal (lower) : Constipation  Constitutional : Fatigue  Skin: Negative for skin symptoms  Eyes: Negative for eye symptoms  Ear/Nose/Throat : Negative for Ear/Nose/Throat symptoms  Hematologic/Lymphatic: Easy bruising  Cardiovascular : Leg swelling  Respiratory : Negative for respiratory symptoms  Endocrine: Negative for endocrine symptoms  Musculoskeletal: Back pain Joint pain  Neurological: Negative for neurological symptoms  Psychologic: Negative for psychiatric symptoms

## 2021-04-18 LAB — SURGICAL PATHOLOGY

## 2021-04-21 ENCOUNTER — Telehealth: Payer: Self-pay

## 2021-04-21 NOTE — Telephone Encounter (Signed)
Called pt to discuss Bx results. Unable to leave message because pt's vm isn't set up. Jaelan Rasheed l Kabir Brannock, CMA

## 2021-04-21 NOTE — Telephone Encounter (Signed)
-----   Message from Lavonia Drafts, MD sent at 04/21/2021  3:21 PM EST ----- Please call pt. Her endo bx was WNL.   Thx,  Clh-S

## 2021-04-22 DIAGNOSIS — M79604 Pain in right leg: Secondary | ICD-10-CM | POA: Diagnosis not present

## 2021-04-22 DIAGNOSIS — I781 Nevus, non-neoplastic: Secondary | ICD-10-CM | POA: Diagnosis not present

## 2021-04-22 DIAGNOSIS — R600 Localized edema: Secondary | ICD-10-CM | POA: Diagnosis not present

## 2021-06-10 DIAGNOSIS — M79604 Pain in right leg: Secondary | ICD-10-CM | POA: Diagnosis not present

## 2021-06-10 DIAGNOSIS — M7989 Other specified soft tissue disorders: Secondary | ICD-10-CM | POA: Diagnosis not present

## 2021-06-10 DIAGNOSIS — R600 Localized edema: Secondary | ICD-10-CM | POA: Diagnosis not present

## 2021-06-10 DIAGNOSIS — I87393 Chronic venous hypertension (idiopathic) with other complications of bilateral lower extremity: Secondary | ICD-10-CM | POA: Diagnosis not present

## 2021-06-13 DIAGNOSIS — I83891 Varicose veins of right lower extremities with other complications: Secondary | ICD-10-CM | POA: Diagnosis not present

## 2021-06-20 DIAGNOSIS — M7989 Other specified soft tissue disorders: Secondary | ICD-10-CM | POA: Diagnosis not present

## 2021-06-20 DIAGNOSIS — I83891 Varicose veins of right lower extremities with other complications: Secondary | ICD-10-CM | POA: Diagnosis not present

## 2021-06-20 DIAGNOSIS — I83811 Varicose veins of right lower extremities with pain: Secondary | ICD-10-CM | POA: Diagnosis not present

## 2021-07-08 DIAGNOSIS — I83891 Varicose veins of right lower extremities with other complications: Secondary | ICD-10-CM | POA: Diagnosis not present

## 2021-07-08 DIAGNOSIS — I87391 Chronic venous hypertension (idiopathic) with other complications of right lower extremity: Secondary | ICD-10-CM | POA: Diagnosis not present

## 2021-09-02 DIAGNOSIS — E039 Hypothyroidism, unspecified: Secondary | ICD-10-CM | POA: Diagnosis not present

## 2021-09-19 ENCOUNTER — Encounter: Payer: Self-pay | Admitting: Urology

## 2021-10-01 ENCOUNTER — Other Ambulatory Visit: Payer: Self-pay | Admitting: Urology

## 2021-10-01 MED ORDER — GEMTESA 75 MG PO TABS: 75.0000 mg | ORAL_TABLET | Freq: Every day | ORAL | 11 refills | Status: DC

## 2021-10-14 ENCOUNTER — Ambulatory Visit: Payer: Medicare Other | Admitting: Family Medicine

## 2021-10-22 ENCOUNTER — Other Ambulatory Visit: Payer: Self-pay | Admitting: Urology

## 2021-10-22 MED ORDER — GEMTESA 75 MG PO TABS: 75.0000 mg | ORAL_TABLET | Freq: Every day | ORAL | 11 refills | Status: DC

## 2021-10-23 ENCOUNTER — Encounter: Payer: Self-pay | Admitting: Family Medicine

## 2021-10-28 ENCOUNTER — Other Ambulatory Visit: Payer: Self-pay | Admitting: Urology

## 2021-10-28 MED ORDER — GEMTESA 75 MG PO TABS: 75.0000 mg | ORAL_TABLET | Freq: Every day | ORAL | 11 refills | Status: DC

## 2021-12-12 ENCOUNTER — Encounter: Payer: Self-pay | Admitting: Family Medicine

## 2021-12-12 DIAGNOSIS — I10 Essential (primary) hypertension: Secondary | ICD-10-CM

## 2021-12-15 MED ORDER — METOPROLOL SUCCINATE ER 25 MG PO TB24: 25.0000 mg | ORAL_TABLET | Freq: Every day | ORAL | 1 refills | Status: DC

## 2021-12-15 MED ORDER — VITAMIN D (ERGOCALCIFEROL) 1.25 MG (50000 UNIT) PO CAPS: ORAL_CAPSULE | ORAL | 3 refills | Status: DC

## 2021-12-15 MED ORDER — IRBESARTAN 150 MG PO TABS: 150.0000 mg | ORAL_TABLET | Freq: Every day | ORAL | 1 refills | Status: DC

## 2021-12-31 DIAGNOSIS — L578 Other skin changes due to chronic exposure to nonionizing radiation: Secondary | ICD-10-CM | POA: Diagnosis not present

## 2021-12-31 DIAGNOSIS — L570 Actinic keratosis: Secondary | ICD-10-CM | POA: Diagnosis not present

## 2021-12-31 DIAGNOSIS — L821 Other seborrheic keratosis: Secondary | ICD-10-CM | POA: Diagnosis not present

## 2021-12-31 DIAGNOSIS — L281 Prurigo nodularis: Secondary | ICD-10-CM | POA: Diagnosis not present

## 2022-01-06 DIAGNOSIS — M7989 Other specified soft tissue disorders: Secondary | ICD-10-CM | POA: Diagnosis not present

## 2022-01-06 DIAGNOSIS — R600 Localized edema: Secondary | ICD-10-CM | POA: Diagnosis not present

## 2022-01-06 DIAGNOSIS — I83813 Varicose veins of bilateral lower extremities with pain: Secondary | ICD-10-CM | POA: Diagnosis not present

## 2022-01-06 DIAGNOSIS — I872 Venous insufficiency (chronic) (peripheral): Secondary | ICD-10-CM | POA: Diagnosis not present

## 2022-01-19 ENCOUNTER — Other Ambulatory Visit: Payer: Self-pay

## 2022-01-19 ENCOUNTER — Telehealth: Payer: Self-pay | Admitting: Family Medicine

## 2022-01-19 NOTE — Telephone Encounter (Signed)
Rx is on back order and pt is needing to switch pharmacy's.   Medication: irbesartan (AVAPRO) 150 MG tablet  Has the patient contacted their pharmacy? No.  Preferred Pharmacy:  [facility] [address], [place], [place], [place] [address]

## 2022-01-20 ENCOUNTER — Other Ambulatory Visit: Payer: Self-pay

## 2022-01-20 MED ORDER — IRBESARTAN 150 MG PO TABS: 150.0000 mg | ORAL_TABLET | Freq: Every day | ORAL | 1 refills | Status: DC

## 2022-01-20 NOTE — Telephone Encounter (Signed)
Rx was sent in. 

## 2022-02-17 DIAGNOSIS — E039 Hypothyroidism, unspecified: Secondary | ICD-10-CM | POA: Diagnosis not present

## 2022-02-18 ENCOUNTER — Encounter: Payer: Self-pay | Admitting: General Practice

## 2022-03-05 ENCOUNTER — Other Ambulatory Visit: Payer: Self-pay | Admitting: Family Medicine

## 2022-03-05 ENCOUNTER — Telehealth: Payer: Self-pay | Admitting: Family Medicine

## 2022-03-05 DIAGNOSIS — I10 Essential (primary) hypertension: Secondary | ICD-10-CM

## 2022-03-05 MED ORDER — IRBESARTAN 150 MG PO TABS: 150.0000 mg | ORAL_TABLET | Freq: Every day | ORAL | 1 refills | Status: DC

## 2022-03-05 MED ORDER — METOPROLOL SUCCINATE ER 25 MG PO TB24: 25.0000 mg | ORAL_TABLET | Freq: Every day | ORAL | 1 refills | Status: DC

## 2022-03-05 MED ORDER — FUROSEMIDE 20 MG PO TABS: 20.0000 mg | ORAL_TABLET | Freq: Every day | ORAL | 1 refills | Status: DC | PRN

## 2022-03-05 NOTE — Telephone Encounter (Signed)
Metoprolol and Avapro sent to pharmacy   Please advise on Lasix

## 2022-03-05 NOTE — Telephone Encounter (Signed)
Medication:  1) metoprolol succinate (TOPROL-XL) 25 MG 24 hr tablet   2) irbesartan (AVAPRO) 150 MG tablet    3) furosemide (LASIX) 20 MG tablet [[id number]] --- currently discontinued, Pt said she has been swelling lately and had to start taking it again.    Preferred Pharmacy (with phone number or street name): [facility] - [place], [place] [address] [facility]  [address] [place], [place] [address]  Phone:  [phone]  Fax:  [phone]   Agent: Please be advised that RX refills may take up to 3 business days. We ask that you follow-up with your pharmacy.

## 2022-03-23 ENCOUNTER — Other Ambulatory Visit: Payer: Self-pay | Admitting: Family Medicine

## 2022-03-23 MED ORDER — FUROSEMIDE 20 MG PO TABS: 20.0000 mg | ORAL_TABLET | Freq: Every day | ORAL | 1 refills | Status: DC | PRN

## 2022-03-23 NOTE — Telephone Encounter (Signed)
Pt needed this to go to the pharmacy listed below. [facility] would like [facility] taken off her pharmacy list.    [facility] [place], [place] [address] [facility]  [address] [place], [place] [address]  Phone:  [phone]  Fax:  [phone]

## 2022-04-13 ENCOUNTER — Other Ambulatory Visit: Payer: Self-pay | Admitting: Internal Medicine

## 2022-04-16 ENCOUNTER — Ambulatory Visit: Payer: Medicare Other | Admitting: Family Medicine

## 2022-04-20 NOTE — Assessment & Plan Note (Signed)
Well controlled, no changes to meds. Encouraged heart healthy diet such as the DASH diet and exercise as tolerated.  °

## 2022-04-20 NOTE — Assessment & Plan Note (Signed)
Supplement and monitor 

## 2022-04-20 NOTE — Assessment & Plan Note (Signed)
Avoid offending foods, start probiotics. Do not eat large meals in late evening and consider raising head of bed.  

## 2022-04-20 NOTE — Assessment & Plan Note (Addendum)
On [place]our Thyroid, continue to monitor

## 2022-04-20 NOTE — Assessment & Plan Note (Signed)
Encourage heart healthy diet such as MIND or DASH diet, increase exercise, avoid trans fats, simple carbohydrates and processed foods, consider a krill or fish or flaxseed oil cap daily.  Tolerating Repatha

## 2022-04-20 NOTE — Assessment & Plan Note (Signed)
hgba1c acceptable, minimize simple carbs. Increase exercise as tolerated.  

## 2022-04-21 ENCOUNTER — Ambulatory Visit (INDEPENDENT_AMBULATORY_CARE_PROVIDER_SITE_OTHER): Payer: Medicare PPO | Admitting: Family Medicine

## 2022-04-21 VITALS — BP 134/76 | HR 57 | Temp 97.5°F | Resp 16 | Ht 63.0 in | Wt 222.4 lb

## 2022-04-21 DIAGNOSIS — Z0001 Encounter for general adult medical examination with abnormal findings: Secondary | ICD-10-CM | POA: Diagnosis not present

## 2022-04-21 DIAGNOSIS — Z1239 Encounter for other screening for malignant neoplasm of breast: Secondary | ICD-10-CM

## 2022-04-21 DIAGNOSIS — E538 Deficiency of other specified B group vitamins: Secondary | ICD-10-CM

## 2022-04-21 DIAGNOSIS — E559 Vitamin D deficiency, unspecified: Secondary | ICD-10-CM | POA: Diagnosis not present

## 2022-04-21 DIAGNOSIS — E782 Mixed hyperlipidemia: Secondary | ICD-10-CM

## 2022-04-21 DIAGNOSIS — L659 Nonscarring hair loss, unspecified: Secondary | ICD-10-CM

## 2022-04-21 DIAGNOSIS — R739 Hyperglycemia, unspecified: Secondary | ICD-10-CM

## 2022-04-21 DIAGNOSIS — Z0000 Encounter for general adult medical examination without abnormal findings: Secondary | ICD-10-CM

## 2022-04-21 DIAGNOSIS — E039 Hypothyroidism, unspecified: Secondary | ICD-10-CM | POA: Diagnosis not present

## 2022-04-21 DIAGNOSIS — I10 Essential (primary) hypertension: Secondary | ICD-10-CM

## 2022-04-21 DIAGNOSIS — R252 Cramp and spasm: Secondary | ICD-10-CM

## 2022-04-21 DIAGNOSIS — R1013 Epigastric pain: Secondary | ICD-10-CM | POA: Diagnosis not present

## 2022-04-21 DIAGNOSIS — M8580 Other specified disorders of bone density and structure, unspecified site: Secondary | ICD-10-CM

## 2022-04-21 DIAGNOSIS — Z23 Encounter for immunization: Secondary | ICD-10-CM

## 2022-04-21 NOTE — Assessment & Plan Note (Addendum)
Patient encouraged to maintain heart healthy diet, regular exercise, adequate sleep. Consider daily probiotics. Take medications as prescribed. Labs ordered and reviewed  RSV (respiratory syncitial virus) vaccine at pharmacy, [place] booster t pharmacy High dose flu shot given today Shingrix is the new shingles shot, 2 shots over 2-6 months, confirm coverage with insurance and document, then can return here for shots with nurse appt or at pharmacy  Colonoscopy 2016 repeat in 2026 Mgm 08/2020 ordered today pap12/22 repeat in 3-5 years  Dexa 06/2020 repeat in 2024 or 2025

## 2022-04-21 NOTE — Assessment & Plan Note (Signed)
Hydrate and monitor 

## 2022-04-21 NOTE — Assessment & Plan Note (Signed)
Check labs and hydrate well

## 2022-04-21 NOTE — Patient Instructions (Addendum)
RSV (respiratory syncitial virus) vaccine at pharmacy, [place] booster t pharmacy High dose flu given today Shingrix is the new shingles shot, 2 shots over 2-6 months, confirm coverage with insurance and document, then can return here for shots with nurse appt or at [place] 65 Years and Older, Female Preventive care refers to lifestyle choices and visits with your health care provider that can promote health and wellness. Preventive care visits are also called wellness exams. What can I expect for my preventive care visit? Counseling Your health care provider may ask you questions about your: Medical history, including: Past medical problems. Family medical history. Pregnancy and menstrual history. History of falls. Current health, including: Memory and ability to understand (cognition). Emotional well-being. Home life and relationship well-being. Sexual activity and sexual health. Lifestyle, including: Alcohol, nicotine or tobacco, and drug use. Access to firearms. Diet, exercise, and sleep habits. Work and work Statistician. Sunscreen use. Safety issues such as seatbelt and bike helmet use. Physical exam Your health care provider will check your: Height and weight. These may be used to calculate your BMI (body mass index). BMI is a measurement that tells if you are at a healthy weight. Waist circumference. This measures the distance around your waistline. This measurement also tells if you are at a healthy weight and may help predict your risk of certain diseases, such as type 2 diabetes and high blood pressure. Heart rate and blood pressure. Body temperature. Skin for abnormal spots. What immunizations do I need?  Vaccines are usually given at various ages, according to a schedule. Your health care provider will recommend vaccines for you based on your age, medical history, and lifestyle or other factors, such as travel or where you work. What tests do I  need? Screening Your health care provider may recommend screening tests for certain conditions. This may include: Lipid and cholesterol levels. Hepatitis C test. Hepatitis B test. HIV (human immunodeficiency virus) test. STI (sexually transmitted infection) testing, if you are at risk. Lung cancer screening. Colorectal cancer screening. Diabetes screening. This is done by checking your blood sugar (glucose) after you have not eaten for a while (fasting). Mammogram. Talk with your health care provider about how often you should have regular mammograms. BRCA-related cancer screening. This may be done if you have a family history of breast, ovarian, tubal, or peritoneal cancers. Bone density scan. This is done to screen for osteoporosis. Talk with your health care provider about your test results, treatment options, and if necessary, the need for more tests. Follow these instructions at home: Eating and drinking  Eat a diet that includes fresh fruits and vegetables, whole grains, lean protein, and low-fat dairy products. Limit your intake of foods with high amounts of sugar, saturated fats, and salt. Take vitamin and mineral supplements as recommended by your health care provider. Do not drink alcohol if your health care provider tells you not to drink. If you drink alcohol: Limit how much you have to 0-1 drink a day. Know how much alcohol is in your drink. In the [place] one drink equals one 12 oz bottle of beer (355 mL), one 5 oz glass of wine (148 mL), or one 1 oz glass of hard liquor (44 mL). Lifestyle Brush your teeth every morning and night with fluoride toothpaste. Floss one time each day. Exercise for at least 30 minutes 5 or more days each week. Do not use any products that contain nicotine or tobacco. These products include cigarettes, chewing  tobacco, and vaping devices, such as e-cigarettes. If you need help quitting, ask your health care provider. Do not use drugs. If you are  sexually active, practice safe sex. Use a condom or other form of protection in order to prevent STIs. Take aspirin only as told by your health care provider. Make sure that you understand how much to take and what form to take. Work with your health care provider to find out whether it is safe and beneficial for you to take aspirin daily. Ask your health care provider if you need to take a cholesterol-lowering medicine (statin). Find healthy ways to manage stress, such as: Meditation, yoga, or listening to music. Journaling. Talking to a trusted person. Spending time with friends and family. Minimize exposure to UV radiation to reduce your risk of skin cancer. Safety Always wear your seat belt while driving or riding in a vehicle. Do not drive: If you have been drinking alcohol. Do not ride with someone who has been drinking. When you are tired or distracted. While texting. If you have been using any mind-altering substances or drugs. Wear a helmet and other protective equipment during sports activities. If you have firearms in your house, make sure you follow all gun safety procedures. What's next? Visit your health care provider once a year for an annual wellness visit. Ask your health care provider how often you should have your eyes and teeth checked. Stay up to date on all vaccines. This information is not intended to replace advice given to you by your health care provider. Make sure you discuss any questions you have with your health care provider. Document Revised: 11/20/2020 Document Reviewed: 11/20/2020 Elsevier Patient Education  [place].

## 2022-04-21 NOTE — Progress Notes (Signed)
Subjective:   By signing my name below, I, Kellie Simmering, attest that this documentation has been prepared under the direction and in the presence of Mosie Lukes, MD., 04/21/2022.     Patient ID: Destiny [name], female    DOB: [birth year], 71 y.o.   MRN: [id number]  No chief complaint on file.  HPI Patient is in today for a comprehensive physical exam and follow up on chronic medical concerns.  Patient denies having fever, chills, ear pain, headaches, muscle pain, joint pain, new moles, rash, itching, congestion, sinus pain, sore throat, chest pain, palpitations, wheezing, nausea, vomitting, abdominal pain, diarrhea, constipation, blood in stool, dysuria, urgency, frequency and hematuria.  Cramps: Patient reports that she occasionally experiences intermittent cramps, but these are manageable.  Dental: Dental care up to date.  Cholesterol: She states that she doing well while taking Repatha 140 mg/mL.  Lab Results  Component Value Date   CHOL 128 11/15/2020   HDL 38 (L) 11/15/2020   LDLCALC 56 11/15/2020   LDLDIRECT 71.0 04/18/2020   TRIG 211 (H) 11/15/2020   CHOLHDL 3.4 11/15/2020   FHx: No changes to FHx.  Right Ear Pain: Patient states that she experiences pain in her right ear due to certain pitches.   Smell: She reports that she has been unable to smell since contracting Covid-19 in 01/2021.  Weight Loss: Patient reports that her weight improves or stabilizes when partaking in "The Next 56 Days" program. Body mass index is 39.4 kg/m.  Wt Readings from Last 3 Encounters:  04/21/22 222 lb 6.4 oz (100.9 kg)  04/17/21 227 lb (103 kg)  04/16/21 228 lb (103.4 kg)   Immunizations: Patient is interested in receiving an Influenza immunization today. Immunization History  Administered Date(s) Administered   Fluad Quad(high Dose 65+) 03/13/2019, 04/18/2020, 04/15/2021   PFIZER(Purple Top)SARS-COV-2 Vaccination 10/11/2019, 11/01/2019   Pneumococcal Conjugate-13 02/10/2018    Pneumococcal Polysaccharide-23 04/18/2020   Tdap 04/01/2017   Past Medical History:  Diagnosis Date   Abnormal blood chemistry    Arthropathy    Back pain    Breast cyst    Cellulitis    Chest discomfort    Chronic bronchitis (HCC)    Constipation    Disturbance of skin sensation    Edema    Encounter for screening and preventative care 06/17/2016   Fatty liver    GERD (gastroesophageal reflux disease)    Hiatal hernia with gastroesophageal reflux 06/17/2016   High risk medication use    Hoarseness 06/15/2016   Hyperglycemia    Hyperglycemia 12/21/2016   Hyperlipidemia    Hyperlipidemia, mixed 06/15/2016   Hypertension    Hypopotassemia    Hypothyroidism    Joint pain    Menopause    Obesity    Palpitations    Positive ANA (antinuclear antibody)    Pre-diabetes    Shoulder pain, right    Skin cancer    Leg   Sleep apnea    denies   SOB (shortness of breath)    Thyroid disease 12/11/2014   Thyroid nodule    Urinary incontinence 04/01/2017   Vitamin D deficiency    Vitamin D deficiency    Past Surgical History:  Procedure Laterality Date   BREAST CYST ASPIRATION Left    30 years ago  1970's or 1980's   INTRAVASCULAR PRESSURE WIRE/FFR STUDY N/A 04/25/2018   Procedure: INTRAVASCULAR PRESSURE WIRE/FFR STUDY;  Surgeon: Jettie Booze, MD;  Location: [facility];  Service: Cardiovascular;  Laterality: N/A;   LEFT HEART CATH AND CORONARY ANGIOGRAPHY N/A 04/25/2018   Procedure: LEFT HEART CATH AND CORONARY ANGIOGRAPHY;  Surgeon: Jettie Booze, MD;  Location: [facility];  Service: Cardiovascular;  Laterality: N/A;   LIPOSUCTION  1990   over gluteal area    VEIN SURGERY Left 2022   WISDOM TOOTH EXTRACTION     and all upper teeth   Family History  Problem Relation Age of Onset   Irregular heart beat Mother    Heart disease Mother    Rheumatic fever Mother    Hypertension Father    Ulcers Father    Hyperlipidemia Father    Stroke Maternal  Grandmother    Diabetes Maternal Grandmother    Hypertension Paternal Grandmother    Ulcers Paternal Grandfather    Social History   Socioeconomic History   Marital status: Married    Spouse name: Erial Fikes   Number of children: Not on file   Years of education: Not on file   Highest education level: Not on file  Occupational History   Occupation: homemaker  Tobacco Use   Smoking status: Former    Packs/day: 1.00    Years: 27.00    Total pack years: 27.00    Types: Cigarettes    Quit date: 2011    Years since quitting: 12.8   Smokeless tobacco: Never  Vaping Use   Vaping Use: Never used  Substance and Sexual Activity   Alcohol use: Yes    Alcohol/week: 0.0 standard drinks of alcohol   Drug use: No   Sexual activity: Not Currently    Birth control/protection: Post-menopausal    Comment: menopause around age [age]  Other Topics Concern   Not on file  Social History Narrative   Occasional alcohol use: hard liquor, wine and beer   Social Determinants of Health   Financial Resource Strain: Not on file  Food Insecurity: Not on file  Transportation Needs: Not on file  Physical Activity: Not on file  Stress: Not on file  Social Connections: Not on file  Intimate Partner Violence: Not on file   Outpatient Medications Prior to Visit  Medication Sig Dispense Refill   albuterol (VENTOLIN HFA) 108 (90 Base) MCG/ACT inhaler Inhale 2 puffs into the lungs every 6 (six) hours as needed for wheezing or shortness of breath. 18 g 5   aspirin EC 81 MG tablet Take 1 tablet (81 mg total) by mouth daily. 90 tablet 3   benzonatate (TESSALON) 100 MG capsule Take 1 capsule (100 mg total) by mouth 2 (two) times daily as needed for cough. 20 capsule 1   betamethasone dipropionate 0.05 % cream Apply 1 application topically 2 (two) times daily as needed (rash).     co-enzyme Q-10 50 MG capsule Take 50 mg by mouth daily.     docusate sodium (DOK) 100 MG capsule Take 1 capsule (100 mg total)  by mouth 2 (two) times daily as needed for mild constipation. 180 capsule 1   furosemide (LASIX) 20 MG tablet Take 1 tablet (20 mg total) by mouth daily as needed for fluid or edema. prn weight gain>3#/24 hours, SOB 90 tablet 1   irbesartan (AVAPRO) 150 MG tablet Take 1 tablet (150 mg total) by mouth daily. 90 tablet 1   metoprolol succinate (TOPROL-XL) 25 MG 24 hr tablet Take 1 tablet (25 mg total) by mouth daily. 90 tablet 1   misoprostol (CYTOTEC) 200 MCG tablet Place two tablets in the vagina the  night prior to your next clinic appointment 2 tablet 2   nitroGLYCERIN (NITROSTAT) 0.4 MG SL tablet Place 1 tablet (0.4 mg total) under the tongue every 5 (five) minutes as needed for chest pain. 25 tablet 3   REPATHA SURECLICK 124 MG/ML SOAJ INJECT 1 PEN ('140MG'$ ) SUBCUTANEOUSLY EVERY 14 DAYS 6 mL 3   thyroid (ARMOUR) 30 MG tablet Take 30 mg by mouth.     thyroid (ARMOUR) 90 MG tablet Take 90 mg by mouth daily.     Vibegron (GEMTESA) 75 MG TABS Take 75 mg by mouth daily. 30 tablet 11   Vitamin D, Ergocalciferol, (DRISDOL) 1.25 MG (50000 UNIT) CAPS capsule TAKE ONE CAPSULE BY MOUTH EVERY SEVEN DAYS 12 capsule 3   Zinc 100 MG TABS Take 100 mg by mouth daily.     No facility-administered medications prior to visit.   Allergies  Allergen Reactions   Crestor [Rosuvastatin Calcium] Other (See Comments)    myalgias   Diclofenac Itching, Nausea Only and Other (See Comments)    Wheezing   Hydrochlorothiazide     Wheezing. Losing teeth.   Statins    Review of Systems  Constitutional:  Negative for chills and fever.  HENT:  Negative for congestion, ear pain, sinus pain and sore throat.   Respiratory:  Negative for shortness of breath and wheezing.   Cardiovascular:  Negative for chest pain and palpitations.  Gastrointestinal:  Negative for abdominal pain, blood in stool, constipation, diarrhea, nausea and vomiting.  Genitourinary:  Negative for dysuria, frequency, hematuria and urgency.   Musculoskeletal:  Negative for joint pain and myalgias.  Skin:  Negative for itching and rash.       (-) new moles.  Neurological:  Negative for headaches.      Objective:    Physical Exam Constitutional:      General: She is not in acute distress.    Appearance: Normal appearance. She is not ill-appearing.  HENT:     Head: Normocephalic and atraumatic.     Right Ear: Tympanic membrane, ear canal and external ear normal.     Left Ear: Tympanic membrane, ear canal and external ear normal.     Mouth/Throat:     Mouth: Mucous membranes are moist.     Pharynx: Oropharynx is clear.  Eyes:     Extraocular Movements: Extraocular movements intact.     Right eye: No nystagmus.     Left eye: No nystagmus.     Pupils: Pupils are equal, round, and reactive to light.  Neck:     Vascular: No carotid bruit.  Cardiovascular:     Rate and Rhythm: Normal rate and regular rhythm.     Pulses: Normal pulses.     Heart sounds: Normal heart sounds. No murmur heard.    No gallop.  Pulmonary:     Effort: Pulmonary effort is normal. No respiratory distress.     Breath sounds: Normal breath sounds. No wheezing or rales.  Abdominal:     General: Bowel sounds are normal.     Tenderness: There is no abdominal tenderness.  Musculoskeletal:     Comments: Muscle strength 5/5 on upper and lower extremities.   Lymphadenopathy:     Cervical: No cervical adenopathy.  Skin:    General: Skin is warm and dry.     Comments: varicose vein on right forearm.  Neurological:     Mental Status: She is alert and oriented to person, place, and time.     Sensory: Sensation is  intact.     Motor: Motor function is intact.     Coordination: Coordination is intact.     Deep Tendon Reflexes:     Reflex Scores:      Patellar reflexes are 2+ on the right side and 2+ on the left side. Psychiatric:        Mood and Affect: Mood normal.        Behavior: Behavior normal.        Judgment: Judgment normal.     There  were no vitals taken for this visit. Wt Readings from Last 3 Encounters:  04/17/21 227 lb (103 kg)  04/16/21 228 lb (103.4 kg)  04/15/21 228 lb 9.6 oz (103.7 kg)   Diabetic Foot Exam - Simple   No data filed    Lab Results  Component Value Date   WBC 5.2 04/15/2021   HGB 13.8 04/15/2021   HCT 41.1 04/15/2021   PLT 270.0 04/15/2021   GLUCOSE 96 04/15/2021   CHOL 128 11/15/2020   TRIG 211 (H) 11/15/2020   HDL 38 (L) 11/15/2020   LDLDIRECT 71.0 04/18/2020   LDLCALC 56 11/15/2020   ALT 25 04/15/2021   AST 20 04/15/2021   NA 137 04/15/2021   K 4.4 04/15/2021   CL 100 04/15/2021   CREATININE 0.87 04/15/2021   BUN 16 04/15/2021   CO2 29 04/15/2021   TSH 6.18 (H) 10/06/2019   HGBA1C 5.9 04/15/2021   Lab Results  Component Value Date   TSH 6.18 (H) 10/06/2019   Lab Results  Component Value Date   WBC 5.2 04/15/2021   HGB 13.8 04/15/2021   HCT 41.1 04/15/2021   MCV 90.7 04/15/2021   PLT 270.0 04/15/2021   Lab Results  Component Value Date   NA 137 04/15/2021   K 4.4 04/15/2021   CO2 29 04/15/2021   GLUCOSE 96 04/15/2021   BUN 16 04/15/2021   CREATININE 0.87 04/15/2021   BILITOT 0.4 04/15/2021   ALKPHOS 67 04/15/2021   AST 20 04/15/2021   ALT 25 04/15/2021   PROT 7.0 04/15/2021   ALBUMIN 4.4 04/15/2021   CALCIUM 9.5 04/15/2021   ANIONGAP 13 08/22/2020   GFR 68.39 04/15/2021   Lab Results  Component Value Date   CHOL 128 11/15/2020   Lab Results  Component Value Date   HDL 38 (L) 11/15/2020   Lab Results  Component Value Date   LDLCALC 56 11/15/2020   Lab Results  Component Value Date   TRIG 211 (H) 11/15/2020   Lab Results  Component Value Date   CHOLHDL 3.4 11/15/2020   Lab Results  Component Value Date   HGBA1C 5.9 04/15/2021   Colonoscopy: Last completed on 08/16/2014.  -Duodenal nodule, probably a lipoma, which has been biopsied.  -Otherwise normal EGD. -Normal screening colonoscopy. Repeat in 10 years.   DEXA: Last completed on  07/03/2020. The BMD measured at Femur Neck Left is 0.719 g/cm2 with a T-score of -2.3. This patient is considered osteopenic according to [facility]) criteria. The scan quality is good. Repeat in 5 years.  Mammogram: Last completed on 08/21/2020. No mammographic evidence of malignancy. Repeat in 1-2 years.  Pap Smear: Last completed on 03/19/2021. Normal results. Repeat in 3-5 years.     Assessment & Plan:   Problem List Items Addressed This Visit       Cardiovascular and Mediastinum   Essential hypertension - Primary     Endocrine   Hypothyroidism (Chronic)     Other  Hyperglycemia   Mixed hyperlipidemia   Vitamin D deficiency   Dyspepsia   Vitamin B12 deficiency   No orders of the defined types were placed in this encounter.  I, Kellie Simmering, personally preformed the services described in this documentation.  All medical record entries made by the scribe were at my direction and in my presence.  I have reviewed the chart and discharge instructions (if applicable) and agree that the record reflects my personal performance and is accurate and complete. 04/21/2022  I,Mohammed Iqbal,acting as a scribe for Penni Homans, MD.,have documented all relevant documentation on the behalf of Penni Homans, MD,as directed by  Penni Homans, MD while in the presence of Penni Homans, MD.  Kellie Simmering

## 2022-04-21 NOTE — Assessment & Plan Note (Signed)
Bone density shows osteopenia, which is thinner than normal but not as bad as osteoporosis. Recommend calcium intake of 1200 to 1500 mg daily, divided into roughly 3 doses. Ammirati source is the diet and a single dairy serving is about 500 mg, a supplement of calcium citrate once or twice daily to balance diet is fine if not getting enough in diet. Also need Vitamin D 2000 IU caps, 1 cap daily if not already taking vitamin D. Also recommend weight baring exercise on hips and upper body to keep bones strong  

## 2022-04-22 ENCOUNTER — Ambulatory Visit (INDEPENDENT_AMBULATORY_CARE_PROVIDER_SITE_OTHER): Payer: Medicare PPO | Admitting: *Deleted

## 2022-04-22 ENCOUNTER — Other Ambulatory Visit (INDEPENDENT_AMBULATORY_CARE_PROVIDER_SITE_OTHER): Payer: Medicare PPO

## 2022-04-22 DIAGNOSIS — Z0000 Encounter for general adult medical examination without abnormal findings: Secondary | ICD-10-CM | POA: Diagnosis not present

## 2022-04-22 DIAGNOSIS — R739 Hyperglycemia, unspecified: Secondary | ICD-10-CM | POA: Diagnosis not present

## 2022-04-22 LAB — CBC WITH DIFFERENTIAL/PLATELET
Basophils Absolute: 0.1 10*3/uL (ref 0.0–0.1)
Basophils Relative: 1.2 % (ref 0.0–3.0)
Eosinophils Absolute: 0.2 10*3/uL (ref 0.0–0.7)
Eosinophils Relative: 4.6 % (ref 0.0–5.0)
HCT: 40.5 % (ref 36.0–46.0)
Hemoglobin: 13.6 g/dL (ref 12.0–15.0)
Lymphocytes Relative: 32.6 % (ref 12.0–46.0)
Lymphs Abs: 1.6 10*3/uL (ref 0.7–4.0)
MCHC: 33.5 g/dL (ref 30.0–36.0)
MCV: 90.5 fl (ref 78.0–100.0)
Monocytes Absolute: 0.5 10*3/uL (ref 0.1–1.0)
Monocytes Relative: 11.4 % (ref 3.0–12.0)
Neutro Abs: 2.4 10*3/uL (ref 1.4–7.7)
Neutrophils Relative %: 50.2 % (ref 43.0–77.0)
Platelets: 273 10*3/uL (ref 150.0–400.0)
RBC: 4.47 Mil/uL (ref 3.87–5.11)
RDW: 13.6 % (ref 11.5–15.5)
WBC: 4.8 10*3/uL (ref 4.0–10.5)

## 2022-04-22 LAB — COMPREHENSIVE METABOLIC PANEL
ALT: 27 U/L (ref 0–35)
AST: 20 U/L (ref 0–37)
Albumin: 4.3 g/dL (ref 3.5–5.2)
Alkaline Phosphatase: 64 U/L (ref 39–117)
BUN: 18 mg/dL (ref 6–23)
CO2: 29 mEq/L (ref 19–32)
Calcium: 9.5 mg/dL (ref 8.4–10.5)
Chloride: 102 mEq/L (ref 96–112)
Creatinine, Ser: 0.81 mg/dL (ref 0.40–1.20)
GFR: 73.98 mL/min (ref >60.00)
Glucose, Bld: 91 mg/dL (ref 70–99)
Potassium: 4.2 mEq/L (ref 3.5–5.1)
Sodium: 137 mEq/L (ref 135–145)
Total Bilirubin: 0.3 mg/dL (ref 0.2–1.2)
Total Protein: 6.8 g/dL (ref 6.0–8.3)

## 2022-04-22 LAB — HEMOGLOBIN A1C: Hgb A1c MFr Bld: 5.8 % (ref 4.6–6.5)

## 2022-04-22 LAB — MAGNESIUM: Magnesium: 2.3 mg/dL (ref 1.5–2.5)

## 2022-04-22 LAB — VITAMIN D 25 HYDROXY (VIT D DEFICIENCY, FRACTURES): VITD: 44.76 ng/mL (ref 30.00–100.00)

## 2022-04-22 LAB — VITAMIN B12: Vitamin B-12: 750 pg/mL (ref 211–911)

## 2022-04-22 NOTE — Patient Instructions (Signed)
Destiny [name] , Thank you for taking time to come for your Medicare Wellness Visit. I appreciate your ongoing commitment to your health goals. Please review the following plan we discussed and let me know if I can assist you in the future.   These are the goals we discussed:  Goals   None     This is a list of the screening recommended for you and due dates:  Health Maintenance  Topic Date Due   Screening for Lung Cancer  Never done   Zoster (Shingles) Vaccine (1 of 2) Never done   COVID-19 Vaccine (3 - Pfizer series) 06/05/2022*   Mammogram  08/22/2022   Medicare Annual Wellness Visit  04/23/2023   Colon Cancer Screening  08/15/2024   Tetanus Vaccine  04/02/2027   Pneumonia Vaccine  Completed   Flu Shot  Completed   DEXA scan (bone density measurement)  Completed   Hepatitis C Screening: USPSTF Recommendation to screen - Ages 28-79 yo.  Completed   HPV Vaccine  Aged Out  *Topic was postponed. The date shown is not the original due date.     Next appointment: Follow up in one year for your annual wellness visit.   Preventive Care 27 Years and Older, Female Preventive care refers to lifestyle choices and visits with your health care provider that can promote health and wellness. What does preventive care include? A yearly physical exam. This is also called an annual well check. Dental exams once or twice a year. Routine eye exams. Ask your health care provider how often you should have your eyes checked. Personal lifestyle choices, including: Daily care of your teeth and gums. Regular physical activity. Eating a healthy diet. Avoiding tobacco and drug use. Limiting alcohol use. Practicing safe sex. Taking low-dose aspirin every day. Taking vitamin and mineral supplements as recommended by your health care provider. What happens during an annual well check? The services and screenings done by your health care provider during your annual well check will depend on your age,  overall health, lifestyle risk factors, and family history of disease. Counseling  Your health care provider may ask you questions about your: Alcohol use. Tobacco use. Drug use. Emotional well-being. Home and relationship well-being. Sexual activity. Eating habits. History of falls. Memory and ability to understand (cognition). Work and work Statistician. Reproductive health. Screening  You may have the following tests or measurements: Height, weight, and BMI. Blood pressure. Lipid and cholesterol levels. These may be checked every 5 years, or more frequently if you are over 76 years old. Skin check. Lung cancer screening. You may have this screening every year starting at age 81 if you have a 30-pack-year history of smoking and currently smoke or have quit within the past 15 years. Fecal occult blood test (FOBT) of the stool. You may have this test every year starting at age 20. Flexible sigmoidoscopy or colonoscopy. You may have a sigmoidoscopy every 5 years or a colonoscopy every 10 years starting at age 54. Hepatitis C blood test. Hepatitis B blood test. Sexually transmitted disease (STD) testing. Diabetes screening. This is done by checking your blood sugar (glucose) after you have not eaten for a while (fasting). You may have this done every 1-3 years. Bone density scan. This is done to screen for osteoporosis. You may have this done starting at age 18. Mammogram. This may be done every 1-2 years. Talk to your health care provider about how often you should have regular mammograms. Talk with your health  care provider about your test results, treatment options, and if necessary, the need for more tests. Vaccines  Your health care provider may recommend certain vaccines, such as: Influenza vaccine. This is recommended every year. Tetanus, diphtheria, and acellular pertussis (Tdap, Td) vaccine. You may need a Td booster every 10 years. Zoster vaccine. You may need this after age  41. Pneumococcal 13-valent conjugate (PCV13) vaccine. One dose is recommended after age 17. Pneumococcal polysaccharide (PPSV23) vaccine. One dose is recommended after age 73. Talk to your health care provider about which screenings and vaccines you need and how often you need them. This information is not intended to replace advice given to you by your health care provider. Make sure you discuss any questions you have with your health care provider. Document Released: 06/21/2015 Document Revised: 02/12/2016 Document Reviewed: 03/26/2015 Elsevier Interactive Patient Education  2017 [employer] Prevention in the Home Falls can cause injuries. They can happen to people of all ages. There are many things you can do to make your home safe and to help prevent falls. What can I do on the outside of my home? Regularly fix the edges of walkways and driveways and fix any cracks. Remove anything that might make you trip as you walk through a door, such as a raised step or threshold. Trim any bushes or trees on the path to your home. Use bright outdoor lighting. Clear any walking paths of anything that might make someone trip, such as rocks or tools. Regularly check to see if handrails are loose or broken. Make sure that both sides of any steps have handrails. Any raised decks and porches should have guardrails on the edges. Have any leaves, snow, or ice cleared regularly. Use sand or salt on walking paths during winter. Clean up any spills in your garage right away. This includes oil or grease spills. What can I do in the bathroom? Use night lights. Install grab bars by the toilet and in the tub and shower. Do not use towel bars as grab bars. Use non-skid mats or decals in the tub or shower. If you need to sit down in the shower, use a plastic, non-slip stool. Keep the floor dry. Clean up any water that spills on the floor as soon as it happens. Remove soap buildup in the tub or shower  regularly. Attach bath mats securely with double-sided non-slip rug tape. Do not have throw rugs and other things on the floor that can make you trip. What can I do in the bedroom? Use night lights. Make sure that you have a light by your bed that is easy to reach. Do not use any sheets or blankets that are too big for your bed. They should not hang down onto the floor. Have a firm chair that has side arms. You can use this for support while you get dressed. Do not have throw rugs and other things on the floor that can make you trip. What can I do in the kitchen? Clean up any spills right away. Avoid walking on wet floors. Keep items that you use a lot in easy-to-reach places. If you need to reach something above you, use a strong step stool that has a grab bar. Keep electrical cords out of the way. Do not use floor polish or wax that makes floors slippery. If you must use wax, use non-skid floor wax. Do not have throw rugs and other things on the floor that can make you trip. What can  I do with my stairs? Do not leave any items on the stairs. Make sure that there are handrails on both sides of the stairs and use them. Fix handrails that are broken or loose. Make sure that handrails are as long as the stairways. Check any carpeting to make sure that it is firmly attached to the stairs. Fix any carpet that is loose or worn. Avoid having throw rugs at the top or bottom of the stairs. If you do have throw rugs, attach them to the floor with carpet tape. Make sure that you have a light switch at the top of the stairs and the bottom of the stairs. If you do not have them, ask someone to add them for you. What else can I do to help prevent falls? Wear shoes that: Do not have high heels. Have rubber bottoms. Are comfortable and fit you well. Are closed at the toe. Do not wear sandals. If you use a stepladder: Make sure that it is fully opened. Do not climb a closed stepladder. Make sure that  both sides of the stepladder are locked into place. Ask someone to hold it for you, if possible. Clearly mark and make sure that you can see: Any grab bars or handrails. First and last steps. Where the edge of each step is. Use tools that help you move around (mobility aids) if they are needed. These include: Canes. Walkers. Scooters. Crutches. Turn on the lights when you go into a dark area. Replace any light bulbs as soon as they burn out. Set up your furniture so you have a clear path. Avoid moving your furniture around. If any of your floors are uneven, fix them. If there are any pets around you, be aware of where they are. Review your medicines with your doctor. Some medicines can make you feel dizzy. This can increase your chance of falling. Ask your doctor what other things that you can do to help prevent falls. This information is not intended to replace advice given to you by your health care provider. Make sure you discuss any questions you have with your health care provider. Document Released: 03/21/2009 Document Revised: 10/31/2015 Document Reviewed: 06/29/2014 Elsevier Interactive Patient Education  2017 [employer].

## 2022-04-22 NOTE — Progress Notes (Signed)
Subjective:  Depression and fall screenings done on 04/21/22 in office with PCP.  Verified answers with patient.    Destiny [name] is a 71 y.o. female who presents for Medicare Annual (Subsequent) preventive examination.  I connected with  Destiny [name] on 04/22/22 by a audio enabled telemedicine application and verified that I am speaking with the correct person using two identifiers.  Patient Location: Home  Provider Location: Office/Clinic  I discussed the limitations of evaluation and management by telemedicine. The patient expressed understanding and agreed to proceed.   Review of Systems    Defer to PCP Cardiac Risk Factors include: dyslipidemia;hypertension;advanced age (>37mn, >>58women)     Objective:    There were no vitals filed for this visit. There is no height or weight on file to calculate BMI.     04/22/2022    3:04 PM 08/30/2020    3:14 PM 08/22/2020    8:31 PM 04/25/2018    6:40 AM  Advanced Directives  Does Patient Have a Medical Advance Directive? No No No No  Does patient want to make changes to medical advance directive?    No - Patient declined  Would patient like information on creating a medical advance directive? No - Patient declined  No - Patient declined     Current Medications (verified) Outpatient Encounter Medications as of 04/22/2022  Medication Sig   albuterol (VENTOLIN HFA) 108 (90 Base) MCG/ACT inhaler Inhale 2 puffs into the lungs every 6 (six) hours as needed for wheezing or shortness of breath.   aspirin EC 81 MG tablet Take 1 tablet (81 mg total) by mouth daily.   benzonatate (TESSALON) 100 MG capsule Take 1 capsule (100 mg total) by mouth 2 (two) times daily as needed for cough.   betamethasone dipropionate 0.05 % cream Apply 1 application topically 2 (two) times daily as needed (rash).   co-enzyme Q-10 50 MG capsule Take 50 mg by mouth daily.   docusate sodium (DOK) 100 MG capsule Take 1 capsule (100 mg total) by mouth 2 (two)  times daily as needed for mild constipation.   furosemide (LASIX) 20 MG tablet Take 1 tablet (20 mg total) by mouth daily as needed for fluid or edema. prn weight gain>3#/24 hours, SOB   irbesartan (AVAPRO) 150 MG tablet Take 1 tablet (150 mg total) by mouth daily.   metoprolol succinate (TOPROL-XL) 25 MG 24 hr tablet Take 1 tablet (25 mg total) by mouth daily.   misoprostol (CYTOTEC) 200 MCG tablet Place two tablets in the vagina the night prior to your next clinic appointment   nitroGLYCERIN (NITROSTAT) 0.4 MG SL tablet Place 1 tablet (0.4 mg total) under the tongue every 5 (five) minutes as needed for chest pain.   REPATHA SURECLICK 1694MG/ML SOAJ INJECT 1 PEN ('140MG'$ ) SUBCUTANEOUSLY EVERY 14 DAYS   thyroid (ARMOUR) 30 MG tablet Take 30 mg by mouth. (Patient not taking: Reported on 04/21/2022)   thyroid (ARMOUR) 90 MG tablet Take 90 mg by mouth daily. (Patient not taking: Reported on 04/21/2022)   Vibegron (GEMTESA) 75 MG TABS Take 75 mg by mouth daily.   Vitamin D, Ergocalciferol, (DRISDOL) 1.25 MG (50000 UNIT) CAPS capsule TAKE ONE CAPSULE BY MOUTH EVERY SEVEN DAYS   Zinc 100 MG TABS Take 100 mg by mouth daily.   No facility-administered encounter medications on file as of 04/22/2022.    Allergies (verified) Crestor [rosuvastatin calcium], Diclofenac, Hydrochlorothiazide, and Statins   History: Past Medical History:  Diagnosis Date  Abnormal blood chemistry    Arthropathy    Back pain    Breast cyst    Cellulitis    Chest discomfort    Chronic bronchitis (HCC)    Constipation    Disturbance of skin sensation    Edema    Encounter for screening and preventative care 06/17/2016   Fatty liver    GERD (gastroesophageal reflux disease)    Hiatal hernia with gastroesophageal reflux 06/17/2016   High risk medication use    Hoarseness 06/15/2016   Hyperglycemia    Hyperglycemia 12/21/2016   Hyperlipidemia    Hyperlipidemia, mixed 06/15/2016   Hypertension    Hypopotassemia     Hypothyroidism    Joint pain    Menopause    Obesity    Palpitations    Positive ANA (antinuclear antibody)    Pre-diabetes    Shoulder pain, right    Skin cancer    Leg   Sleep apnea    denies   SOB (shortness of breath)    Thyroid disease 12/11/2014   Thyroid nodule    Urinary incontinence 04/01/2017   Vitamin D deficiency    Vitamin D deficiency    Past Surgical History:  Procedure Laterality Date   BREAST CYST ASPIRATION Left    30 years ago  1970's or 1980's   INTRAVASCULAR PRESSURE WIRE/FFR STUDY N/A 04/25/2018   Procedure: INTRAVASCULAR PRESSURE WIRE/FFR STUDY;  Surgeon: Jettie Booze, MD;  Location: [facility];  Service: Cardiovascular;  Laterality: N/A;   LEFT HEART CATH AND CORONARY ANGIOGRAPHY N/A 04/25/2018   Procedure: LEFT HEART CATH AND CORONARY ANGIOGRAPHY;  Surgeon: Jettie Booze, MD;  Location: [facility];  Service: Cardiovascular;  Laterality: N/A;   LIPOSUCTION  1990   over gluteal area    VEIN SURGERY Left 2022   WISDOM TOOTH EXTRACTION     and all upper teeth   Family History  Problem Relation Age of Onset   Irregular heart beat Mother    Heart disease Mother    Rheumatic fever Mother    Hypertension Father    Ulcers Father    Hyperlipidemia Father    Stroke Maternal Grandmother    Diabetes Maternal Grandmother    Hypertension Paternal Grandmother    Ulcers Paternal Grandfather    Social History   Socioeconomic History   Marital status: Married    Spouse name: Destiny [name]   Number of children: Not on file   Years of education: Not on file   Highest education level: Not on file  Occupational History   Occupation: homemaker  Tobacco Use   Smoking status: Former    Packs/day: 1.00    Years: 27.00    Total pack years: 27.00    Types: Cigarettes    Quit date: 2011    Years since quitting: 12.8   Smokeless tobacco: Never  Vaping Use   Vaping Use: Never used  Substance and Sexual Activity   Alcohol use: Yes     Alcohol/week: 0.0 standard drinks of alcohol   Drug use: No   Sexual activity: Not Currently    Birth control/protection: Post-menopausal    Comment: menopause around age [age]  Other Topics Concern   Not on file  Social History Narrative   Occasional alcohol use: hard liquor, wine and beer   Social Determinants of Health   Financial Resource Strain: Low Risk  (04/22/2022)   Overall Financial Resource Strain (CARDIA)    Difficulty of Paying Living  Expenses: Not hard at all  Food Insecurity: No Food Insecurity (04/22/2022)   Hunger Vital Sign    Worried About Running Out of Food in the Last Year: Never true    Ran Out of Food in the Last Year: Never true  Transportation Needs: No Transportation Needs (04/22/2022)   PRAPARE - Hydrologist (Medical): No    Lack of Transportation (Non-Medical): No  Physical Activity: Sufficiently Active (04/22/2022)   Exercise Vital Sign    Days of Exercise per Week: 3 days    Minutes of Exercise per Session: 90 min  Stress: No Stress Concern Present (04/22/2022)   [place]    Feeling of Stress : Not at all  Social Connections: [place] (04/22/2022)   Social Connection and Isolation Panel [NHANES]    Frequency of Communication with Friends and Family: More than three times a week    Frequency of Social Gatherings with Friends and Family: More than three times a week    Attends Religious Services: More than 4 times per year    Active Member of [employer] or Organizations: Yes    Attends Music therapist: More than 4 times per year    Marital Status: Married    Tobacco Counseling Counseling given: Not Answered   Clinical Intake:  Pre-visit preparation completed: Yes  Pain : No/denies pain  Diabetes: No  How often do you need to have someone help you when you read instructions, pamphlets, or other written materials from  your doctor or pharmacy?: 1 - Never   Activities of Daily Living    04/22/2022    3:04 PM  In your present state of health, do you have any difficulty performing the following activities:  Hearing? 0  Vision? 0  Difficulty concentrating or making decisions? 0  Walking or climbing stairs? 0  Dressing or bathing? 0  Doing errands, shopping? 0  Preparing Food and eating ? N  Using the Toilet? N  In the past six months, have you accidently leaked urine? Y  Do you have problems with loss of bowel control? N  Managing your Medications? N  Managing your Finances? N  Housekeeping or managing your Housekeeping? N    Patient Care Team: Mosie Lukes, MD as PCP - General (Family Medicine) Revankar, Reita Cliche, MD as PCP - Cardiology (Cardiology) Philemon Kingdom, MD as Consulting Physician (Internal Medicine) Debara Pickett Nadean Corwin, MD as Consulting Physician (Cardiology) Felipa Eth, Reece Leader., MD as Referring Physician (Urology)  Indicate any recent Medical Services you may have received from other than [facility] providers in the past year (date may be approximate).     Assessment:   This is a routine wellness examination for Destiny [name].  Hearing/Vision screen No results found.  Dietary issues and exercise activities discussed: Current Exercise Habits: Home exercise routine, Type of exercise: calisthenics;strength training/weights;treadmill, Time (Minutes): 60, Frequency (Times/Week): 3, Weekly Exercise (Minutes/Week): 180, Intensity: Moderate, Exercise limited by: None identified   Goals Addressed   None    Depression Screen    04/21/2022    1:37 PM 10/06/2019    1:21 PM 12/24/2016    8:43 AM 12/25/2015    3:23 PM  PHQ 2/9 Scores  PHQ - 2 Score 0 0 6 0  PHQ- 9 Score 0 0 21     Fall Risk    04/21/2022    1:37 PM 01/13/2021   11:02 AM 12/25/2015    3:23  PM  [place] in the past year? 0 0 No  Number falls in past yr: 0 0   Injury with Fall? 0 0   Risk for fall due to :  No  Fall Risks   Follow up Falls evaluation completed Falls evaluation completed     [place]:  Any stairs in or around the home? Yes  If so, are there any without handrails? No  Home free of loose throw rugs in walkways, pet beds, electrical cords, etc? Yes  Adequate lighting in your home to reduce risk of falls? Yes   ASSISTIVE DEVICES UTILIZED TO PREVENT FALLS:  Life alert? No  Use of a cane, walker or w/c? No  Grab bars in the bathroom? No  Shower chair or bench in shower? No  Elevated toilet seat or a handicapped toilet? Yes   TIMED UP AND GO:  Was the test performed?  No, audio visit .   Cognitive Function:        04/22/2022    3:14 PM  6CIT Screen  What Year? 0 points  What month? 0 points  What time? 0 points  Count back from 20 0 points  Months in reverse 4 points  Repeat phrase 0 points  Total Score 4 points    Immunizations Immunization History  Administered Date(s) Administered   Fluad Quad(high Dose 65+) 03/13/2019, 04/18/2020, 04/15/2021, 04/21/2022   PFIZER(Purple Top)SARS-COV-2 Vaccination 10/11/2019, 11/01/2019   Pneumococcal Conjugate-13 02/10/2018   Pneumococcal Polysaccharide-23 04/18/2020   Tdap 04/01/2017    TDAP status: Up to date  Flu Vaccine status: Up to date  Pneumococcal vaccine status: Up to date  Covid-19 vaccine status: Information provided on how to obtain vaccines.   Qualifies for Shingles Vaccine? Yes   Zostavax completed No   Shingrix Completed?: No.    Education has been provided regarding the importance of this vaccine. Patient has been advised to call insurance company to determine out of pocket expense if they have not yet received this vaccine. Advised may also receive vaccine at local pharmacy or Health Dept. Verbalized acceptance and understanding.  Screening Tests Health Maintenance  Topic Date Due   Medicare Annual Wellness (AWV)  Never done   Lung Cancer Screening  Never done    Zoster Vaccines- Shingrix (1 of 2) Never done   COVID-19 Vaccine (3 - Pfizer series) 06/05/2022 (Originally 12/27/2019)   MAMMOGRAM  08/22/2022   COLONOSCOPY (Pts 45-62yr Insurance coverage will need to be confirmed)  08/15/2024   TETANUS/TDAP  04/02/2027   Pneumonia Vaccine 71 Years old  Completed   INFLUENZA VACCINE  Completed   DEXA SCAN  Completed   Hepatitis C Screening  Completed   HPV VACCINES  Aged Out    Health Maintenance  Health Maintenance Due  Topic Date Due   Medicare Annual Wellness (AWV)  Never done   Lung Cancer Screening  Never done   Zoster Vaccines- Shingrix (1 of 2) Never done    Colorectal cancer screening: Type of screening: Colonoscopy. Completed 08/16/14. Repeat every 10 years  Mammogram status: Ordered 04/21/22. Pt provided with contact info and advised to call to schedule appt.   Bone Density status: Completed 07/03/20. Results reflect: Bone density results: OSTEOPENIA. Repeat every 2 years.  Lung Cancer Screening: (Low Dose CT Chest recommended if Age 69-80years, 30 pack-year currently smoking OR have quit w/in 15years.) does qualify.   Lung Cancer Screening Referral: Pt declined  Additional Screening:  Hepatitis C Screening: does qualify; Completed 03/26/16  Vision Screening: Recommended annual ophthalmology exams for early detection of glaucoma and other disorders of the eye. Is the patient up to date with their annual eye exam?  Yes  Who is the provider or what is the name of the office in which the patient attends annual eye exams? [facility]  If pt is not established with a provider, would they like to be referred to a provider to establish care? No .   Dental Screening: Recommended annual dental exams for proper oral hygiene  Community Resource Referral / Chronic Care Management: CRR required this visit?  No   CCM required this visit?  No      Plan:     I have personally reviewed and noted the following in the patient's chart:    Medical and social history Use of alcohol, tobacco or illicit drugs  Current medications and supplements including opioid prescriptions. Patient is not currently taking opioid prescriptions. Functional ability and status Nutritional status Physical activity Advanced directives List of other physicians Hospitalizations, surgeries, and ER visits in previous 12 months Vitals Screenings to include cognitive, depression, and falls Referrals and appointments  In addition, I have reviewed and discussed with patient certain preventive protocols, quality metrics, and [place] practice recommendations. A written personalized care plan for preventive services as well as general preventive health recommendations were provided to patient.   Due to this being a telephonic visit, the after visit summary with patients personalized plan was offered to patient via mail or my-chart. Patient would like to access on my-chart.  Beatris Ship, [place]   04/22/2022   Nurse Notes: None

## 2022-04-23 LAB — ZINC: Zinc: 98 ug/dL (ref 60–130)

## 2022-04-23 LAB — INSULIN, RANDOM: Insulin: 31.4 u[IU]/mL — ABNORMAL HIGH

## 2022-04-24 ENCOUNTER — Other Ambulatory Visit: Payer: Self-pay

## 2022-04-24 DIAGNOSIS — R739 Hyperglycemia, unspecified: Secondary | ICD-10-CM

## 2022-04-27 ENCOUNTER — Encounter: Payer: Self-pay | Admitting: Family Medicine

## 2022-04-27 NOTE — Telephone Encounter (Signed)
Pt stated will try watching diet first

## 2022-04-28 DIAGNOSIS — E782 Mixed hyperlipidemia: Secondary | ICD-10-CM

## 2022-04-28 DIAGNOSIS — I2510 Atherosclerotic heart disease of native coronary artery without angina pectoris: Secondary | ICD-10-CM

## 2022-05-04 DIAGNOSIS — E782 Mixed hyperlipidemia: Secondary | ICD-10-CM | POA: Diagnosis not present

## 2022-05-04 DIAGNOSIS — I2510 Atherosclerotic heart disease of native coronary artery without angina pectoris: Secondary | ICD-10-CM | POA: Diagnosis not present

## 2022-05-05 LAB — LIPID PANEL
Chol/HDL Ratio: 2.4 ratio (ref 0.0–4.4)
Cholesterol, Total: 130 mg/dL (ref 100–199)
HDL: 54 mg/dL (ref >39)
LDL Chol Calc (NIH): 60 mg/dL (ref 0–99)
Triglycerides: 84 mg/dL (ref 0–149)
VLDL Cholesterol Cal: 16 mg/dL (ref 5–40)

## 2022-05-05 LAB — LIPOPROTEIN A (LPA): Lipoprotein (a): 8.9 nmol/L (ref <75.0)

## 2022-05-07 ENCOUNTER — Encounter: Payer: Self-pay | Admitting: Internal Medicine

## 2022-05-07 ENCOUNTER — Ambulatory Visit: Payer: Medicare PPO | Attending: Internal Medicine | Admitting: Internal Medicine

## 2022-05-07 VITALS — BP 116/84 | HR 58 | Ht 62.0 in | Wt 218.6 lb

## 2022-05-07 DIAGNOSIS — I10 Essential (primary) hypertension: Secondary | ICD-10-CM

## 2022-05-07 DIAGNOSIS — M791 Myalgia, unspecified site: Secondary | ICD-10-CM | POA: Diagnosis not present

## 2022-05-07 DIAGNOSIS — T466X5D Adverse effect of antihyperlipidemic and antiarteriosclerotic drugs, subsequent encounter: Secondary | ICD-10-CM

## 2022-05-07 DIAGNOSIS — T466X5A Adverse effect of antihyperlipidemic and antiarteriosclerotic drugs, initial encounter: Secondary | ICD-10-CM

## 2022-05-07 DIAGNOSIS — E782 Mixed hyperlipidemia: Secondary | ICD-10-CM

## 2022-05-07 DIAGNOSIS — I2510 Atherosclerotic heart disease of native coronary artery without angina pectoris: Secondary | ICD-10-CM

## 2022-05-07 NOTE — Progress Notes (Signed)
LIPID CLINIC CONSULT NOTE  Chief Complaint:  Follow-up dyslipidemia  Primary Care Physician: Mosie Lukes, MD  Primary Cardiologist:  Jenean Lindau, MD  HPI:  Destiny [name] is a 71 y.o. female who is being seen today for the evaluation of dyslipidemia at the request of Mosie Lukes, MD. This is a pleasant 71 year old female with past medical history significant for moderate obesity, hypertension, dyslipidemia, hypothyroidism, palpitations and prior chest pain which led to heart catheterization in November 2019.  This demonstrated a 50% proximal circumflex lesion which was not significant by FFR, a small 75% ostial diagonal lesion as well as a mid LAD lesion of 25% stenosis.  LVEDP was elevated at 20 mmHg and LVEF was normal.  Based on these findings aggressive medical therapy was recommended.  At that time her chest pain seemed somewhat atypical and according to her still persists.  It feels like a twinge and is present at rest and with exertion.  She was however found to have a markedly abnormal lipid profile.  In addition she is struggled with longstanding hypothyroidism and recently had converted from Armour Thyroid over to Synthroid.  Her dose was increased up to 100 mcg daily by her PCP in July.  Was a month ago which showed total cholesterol 263, HDL 39, triglycerides 216, and LDL 183.  He has had triglycerides over 400 in the past.  She reports her diet has been generally unhealthy and does eat a lot of fast food, fried foods and saturated fats.  Recently she started the 56day diet which she says is good so far and she feels like she would likely see a change in her lipids.  She also has a history of statin intolerance.  She has been tried on both rosuvastatin, atorvastatin, and ezetimibe, all of which have caused significant worsening of myalgias.  04/24/2019  [name] returns today for follow-up of dyslipidemia.  She successfully been using Repatha which is both been affordable  and easy to use.  She has no significant side effects.  She has discontinued her red yeast rice.  Unfortunately she has not had repeat lipid testing.  We will plan to do that today since she is fasting.  She denies any side effects such as myalgia.  10/23/2019  [name] is seen today for follow-up.  She is done well on Repatha.  Total cholesterol now 128, triglycerides 158, HDL 44 and LDL 51.  She reports some occasional chest discomfort, not associated with exertion always or relieved by rest.  It is not limited her activities.  She has not required any nitro.  She has not had a new prescription in a while.  Her cardiologist Dr. Geraldo Pitter saw her last in 2019.  I would advise follow-up there.  12/06/2020  [name] is seen today in follow-up.  Overall she continues to do well on Repatha.  Recent lipids showed a slight increase in triglycerides however she had some difficulty with her thyroid and was somewhat hypothyroid, now it has been repleted.  Total cholesterol 128, triglycerides 211, HDL 38 and LDL 56.  Overall she says she feels well denies chest pain or worsening shortness of breath.  05/07/2022  [name] returns today for follow-up.  She has had a significant improvement in her lipids on Repatha.  She is tolerating this quite well.  Total cholesterol now 130, triglycerides 84, HDL 54 and LDL 60.  She denies any chest pain or shortness of breath.  EKG today  is stable.  PMHx:  Past Medical History:  Diagnosis Date   Abnormal blood chemistry    Arthropathy    Back pain    Breast cyst    Cellulitis    Chest discomfort    Chronic bronchitis (HCC)    Constipation    Disturbance of skin sensation    Edema    Encounter for screening and preventative care 06/17/2016   Fatty liver    GERD (gastroesophageal reflux disease)    Hiatal hernia with gastroesophageal reflux 06/17/2016   High risk medication use    Hoarseness 06/15/2016   Hyperglycemia    Hyperglycemia 12/21/2016   Hyperlipidemia     Hyperlipidemia, mixed 06/15/2016   Hypertension    Hypopotassemia    Hypothyroidism    Joint pain    Menopause    Obesity    Palpitations    Positive ANA (antinuclear antibody)    Pre-diabetes    Shoulder pain, right    Skin cancer    Leg   Sleep apnea    denies   SOB (shortness of breath)    Thyroid disease 12/11/2014   Thyroid nodule    Urinary incontinence 04/01/2017   Vitamin D deficiency    Vitamin D deficiency     Past Surgical History:  Procedure Laterality Date   BREAST CYST ASPIRATION Left    30 years ago  1970's or 1980's   INTRAVASCULAR PRESSURE WIRE/FFR STUDY N/A 04/25/2018   Procedure: INTRAVASCULAR PRESSURE WIRE/FFR STUDY;  Surgeon: Jettie Booze, MD;  Location: [facility];  Service: Cardiovascular;  Laterality: N/A;   LEFT HEART CATH AND CORONARY ANGIOGRAPHY N/A 04/25/2018   Procedure: LEFT HEART CATH AND CORONARY ANGIOGRAPHY;  Surgeon: Jettie Booze, MD;  Location: [facility];  Service: Cardiovascular;  Laterality: N/A;   LIPOSUCTION  1990   over gluteal area    VEIN SURGERY Left 2022   WISDOM TOOTH EXTRACTION     and all upper teeth    FAMHx:  Family History  Problem Relation Age of Onset   Irregular heart beat Mother    Heart disease Mother    Rheumatic fever Mother    Hypertension Father    Ulcers Father    Hyperlipidemia Father    Stroke Maternal Grandmother    Diabetes Maternal Grandmother    Hypertension Paternal Grandmother    Ulcers Paternal Grandfather     SOCHx:   reports that she quit smoking about 12 years ago. Her smoking use included cigarettes. She has a 27.00 pack-year smoking history. She has never used smokeless tobacco. She reports current alcohol use. She reports that she does not use drugs.  ALLERGIES:  Allergies  Allergen Reactions   Crestor [Rosuvastatin Calcium] Other (See Comments)    myalgias   Diclofenac Itching, Nausea Only and Other (See Comments)    Wheezing   Hydrochlorothiazide      Wheezing. Losing teeth.   Statins     ROS: Pertinent items noted in HPI and remainder of comprehensive ROS otherwise negative.  HOME MEDS: Current Outpatient Medications on File Prior to Visit  Medication Sig Dispense Refill   albuterol (VENTOLIN HFA) 108 (90 Base) MCG/ACT inhaler Inhale 2 puffs into the lungs every 6 (six) hours as needed for wheezing or shortness of breath. 18 g 5   aspirin EC 81 MG tablet Take 1 tablet (81 mg total) by mouth daily. 90 tablet 3   benzonatate (TESSALON) 100 MG capsule Take 1 capsule (100 mg  total) by mouth 2 (two) times daily as needed for cough. 20 capsule 1   betamethasone dipropionate 0.05 % cream Apply 1 application topically 2 (two) times daily as needed (rash).     docusate sodium (DOK) 100 MG capsule Take 1 capsule (100 mg total) by mouth 2 (two) times daily as needed for mild constipation. 180 capsule 1   furosemide (LASIX) 20 MG tablet Take 1 tablet (20 mg total) by mouth daily as needed for fluid or edema. prn weight gain>3#/24 hours, SOB 90 tablet 1   irbesartan (AVAPRO) 150 MG tablet Take 1 tablet (150 mg total) by mouth daily. 90 tablet 1   metoprolol succinate (TOPROL-XL) 25 MG 24 hr tablet Take 1 tablet (25 mg total) by mouth daily. 90 tablet 1   nitroGLYCERIN (NITROSTAT) 0.4 MG SL tablet Place 1 tablet (0.4 mg total) under the tongue every 5 (five) minutes as needed for chest pain. 25 tablet 3   REPATHA SURECLICK 299 MG/ML SOAJ INJECT 1 PEN ('140MG'$ ) SUBCUTANEOUSLY EVERY 14 DAYS 6 mL 3   thyroid (ARMOUR) 120 MG tablet Take 120 mg by mouth daily before breakfast.     Vibegron (GEMTESA) 75 MG TABS Take 75 mg by mouth daily. 30 tablet 11   Vitamin D, Ergocalciferol, (DRISDOL) 1.25 MG (50000 UNIT) CAPS capsule TAKE ONE CAPSULE BY MOUTH EVERY SEVEN DAYS 12 capsule 3   Zinc 100 MG TABS Take 100 mg by mouth daily.     No current facility-administered medications on file prior to visit.    LABS/IMAGING: No results found for this or any previous  visit (from the past 48 hour(s)). No results found.  LIPID PANEL:    Component Value Date/Time   CHOL 130 05/04/2022 1201   TRIG 84 05/04/2022 1201   HDL 54 05/04/2022 1201   CHOLHDL 2.4 05/04/2022 1201   CHOLHDL 3 04/18/2020 1418   VLDL 47.4 (H) 04/18/2020 1418   LDLCALC 60 05/04/2022 1201   LDLCALC 183 (H) 12/08/2018 1531   LDLDIRECT 71.0 04/18/2020 1418    WEIGHTS: Wt Readings from Last 3 Encounters:  05/07/22 218 lb 9.6 oz (99.2 kg)  04/21/22 222 lb 6.4 oz (100.9 kg)  04/17/21 227 lb (103 kg)    VITALS: BP 116/84   Pulse (!) 58   Ht '5\' 2"'$  (1.575 m)   Wt 218 lb 9.6 oz (99.2 kg)   SpO2 96%   BMI 39.98 kg/m   EXAM: General appearance: alert and no distress Lungs: clear to auscultation bilaterally Heart: regular rate and rhythm Extremities: extremities normal, atraumatic, no cyanosis or edema Neurologic: Grossly normal  EKG: Sinus bradycardia at 58, incomplete right bundle branch block and left anterior fascicular block-personally reviewed  ASSESSMENT: Mixed dyslipidemia with elevated LDL and triglycerides Coronary artery disease with moderate to severe disease by cath (04/2018)-managed medically Morbid obesity Hypertension Palpitations Hypothyroidism  PLAN: 1.   Ms. Rasberry had significant improvement in her lipids on Repatha.  She denies any significant side effects.  Will continue this therapy.  Her thyroid is now better regulated and she is lost some weight.  Her blood pressure is well-controlled.  No changes to her medicines today.  Follow-up annually or sooner as necessary.  Pixie Casino, MD, [facility], [place] Director of the [facility] Lipid Disorders &  Cardiovascular Risk Reduction Clinic Diplomate of the American Board of Clinical Lipidology Attending Cardiologist  Direct Dial: [phone]  Fax: [phone]  Website:  [email or web address]  Nadean Corwin  Nyshaun Standage 05/07/2022, 3:25 PM

## 2022-05-07 NOTE — Patient Instructions (Addendum)
Medication Instructions:  NO CHANGES  *If you need a refill on your cardiac medications before your next appointment, please call your pharmacy*  Lab Work: Fasting lipid testing in 1 year -- before next appointment   Follow-Up: At [facility], you and your health needs are our priority.  As part of our continuing mission to provide you with exceptional heart care, we have created designated Provider Care Teams.  These Care Teams include your primary Cardiologist (physician) and Advanced Practice Providers (APPs -  Physician Assistants and Nurse Practitioners) who all work together to provide you with the care you need, when you need it.  We recommend signing up for the patient portal called "MyChart".  Sign up information is provided on this After Visit Summary.  MyChart is used to connect with patients for Virtual Visits (Telemedicine).  Patients are able to view lab/test results, encounter notes, upcoming appointments, etc.  Non-urgent messages can be sent to your provider as well.   To learn more about what you can do with MyChart, go to [email or web address].    Your next appointment:    12 months with Dr. Debara Pickett

## 2022-06-02 ENCOUNTER — Telehealth (HOSPITAL_BASED_OUTPATIENT_CLINIC_OR_DEPARTMENT_OTHER): Payer: Self-pay

## 2022-08-18 DIAGNOSIS — E039 Hypothyroidism, unspecified: Secondary | ICD-10-CM | POA: Diagnosis not present

## 2022-09-04 ENCOUNTER — Encounter: Payer: Self-pay | Admitting: Family Medicine

## 2022-09-07 NOTE — Telephone Encounter (Signed)
Called pt to see how she was feeling, Pt stated she feels fine and only thing is her shoulder is just little sore But not having any shortness of breath or any chest pain. Pt said she was embarrassed to ask but was just wonder what it meant, just wanted Dr.Blyth tell her what she thought of ECG and she trust her. Pt was advised if symptom return, need to seek ER. Pt said she ok not wanting to be seen for nothing.

## 2022-10-24 ENCOUNTER — Encounter: Payer: Self-pay | Admitting: Family Medicine

## 2022-10-26 MED ORDER — IRBESARTAN 150 MG PO TABS: 150.0000 mg | ORAL_TABLET | Freq: Every day | ORAL | 1 refills | Status: DC

## 2022-11-24 ENCOUNTER — Encounter: Payer: Self-pay | Admitting: Internal Medicine

## 2022-11-24 NOTE — Telephone Encounter (Signed)
Called patient regarding her MyChart message and that MD suggested an appointment. Offered her 6/21 @ 2pm or 2:30pm. She did not wish to schedule this right now, said she had been out of town at appts with her husband today and she just needed time to check her calendar. Did make patient aware that appointments may get scheduled by others. She also states she took and ASA and NTG. Reiterated that she needs a visit, as Apple Watch indicated possible AFib. Advised she can call or send a MyChart message about scheduling.

## 2022-11-27 ENCOUNTER — Encounter: Payer: Self-pay | Admitting: Internal Medicine

## 2022-11-27 ENCOUNTER — Ambulatory Visit: Payer: Medicare PPO | Attending: Internal Medicine | Admitting: Internal Medicine

## 2022-11-27 VITALS — BP 126/62 | HR 66 | Ht 63.0 in | Wt 228.8 lb

## 2022-11-27 DIAGNOSIS — I2510 Atherosclerotic heart disease of native coronary artery without angina pectoris: Secondary | ICD-10-CM

## 2022-11-27 DIAGNOSIS — I480 Paroxysmal atrial fibrillation: Secondary | ICD-10-CM | POA: Diagnosis not present

## 2022-11-27 DIAGNOSIS — G4719 Other hypersomnia: Secondary | ICD-10-CM | POA: Diagnosis not present

## 2022-11-27 DIAGNOSIS — E782 Mixed hyperlipidemia: Secondary | ICD-10-CM

## 2022-11-27 DIAGNOSIS — R0683 Snoring: Secondary | ICD-10-CM | POA: Diagnosis not present

## 2022-11-27 MED ORDER — APIXABAN 5 MG PO TABS: 5.0000 mg | ORAL_TABLET | Freq: Two times a day (BID) | ORAL | 0 refills | Status: DC

## 2022-11-27 MED ORDER — APIXABAN 5 MG PO TABS: 5.0000 mg | ORAL_TABLET | Freq: Two times a day (BID) | ORAL | 3 refills | Status: DC

## 2022-11-27 NOTE — Patient Instructions (Addendum)
Medication Instructions:  STOP aspirin  START eliquis 5mg  twice daily  *If you need a refill on your cardiac medications before your next appointment, please call your pharmacy*  Lab Work: FASTING lab work in 6 months -- prior to next visit   Testing/Procedures: WatchPAT?  Is a FDA cleared portable home sleep study test that uses a watch and 3 points of contact to monitor 7 different channels, including your heart rate, oxygen saturations, body position, snoring, and chest motion.  The study is easy to use from the comfort of your own home and accurately detect sleep apnea.  Before bed, you attach the chest sensor, attached the sleep apnea bracelet to your nondominant hand, and attach the finger probe.  After the study, the raw data is downloaded from the watch and scored for apnea events.   For more information: [email or web address]  Patient Testing Instructions:  Do not put battery into the device until bedtime when you are ready to begin the test. Please call the support number if you need assistance after following the instructions below: 24 hour support line- [phone] or [facility] support at [phone] (option 2)  Download the IntelWatchPAT One" app through the google play store or App Store  Be sure to turn on or enable access to bluetooth in settlings on your smartphone/ device  Make sure no other bluetooth devices are on and within the vicinity of your smartphone/ device and WatchPAT watch during testing.  Make sure to leave your smart phone/ device plugged in and charging all night.  When ready for bed:  Follow the instructions step by step in the WatchPAT One App to activate the testing device. For additional instructions, including video instruction, visit the WatchPAT One video on Youtube. You can search for WatchPat One within Youtube (video is 4 minutes and 18 seconds) or enter: https://youtube/watch?[email or web address]_vbiwxE Please note: You will be prompted to  enter a Pin to connect via bluetooth when starting the test. The PIN will be assigned to you when you receive the test.  The device is disposable, but it recommended that you retain the device until you receive a call letting you know the study has been received and the results have been interpreted.  We will let you know if the study did not transmit to [place] properly after the test is completed. You do not need to call us to confirm the receipt of the test.  Please complete the test within 48 hours of receiving PIN.   Frequently Asked Questions:  What is Watch Dennie Bible one?  A single use fully disposable home sleep apnea testing device and will not need to be returned after completion.  What are the requirements to use WatchPAT one?  The be able to have a successful watchpat one sleep study, you should have your Watch pat one device, your smart phone, watch pat one app, your PIN number and Internet access What type of phone do I need?  You should have a smart phone that uses Android 5.1 and above or any Iphone with IOS 10 and above How can I download the WatchPAT one app?  Based on your device type search for WatchPAT one app either in google play for android devices or APP store for Iphone's Where will I get my PIN for the study?  Your PIN will be provided by your physician's office. It is used for authentication and if you lose/forget your PIN, please reach out to your providers office.  I do not have Internet  at home. Can I do WatchPAT one study?  WatchPAT One needs Internet connection throughout the night to be able to transmit the sleep data. You can use your home/local internet or your cellular's data package. However, it is always recommended to use home/local Internet. It is estimated that between 20MB-30MB will be used with each study.However, the application will be looking for space in the phone to start the study.  What happens if I lose internet or bluetooth connection?  During the  internet disconnection, your phone will not be able to transmit the sleep data. All the data, will be stored in your phone. As soon as the internet connection is back on, the phone will being sending the sleep data. During the bluetooth disconnection, WatchPAT one will not be able to to send the sleep data to your phone. Data will be kept in the [facility] one until two devices have bluetooth connection back on. As soon as the connection is back on, WatchPAT one will send the sleep data to the phone.  How long do I need to wear the WatchPAT one?  After you start the study, you should wear the device at least 6 hours.  How far should I keep my phone from the device?  During the night, your phone should be within 15 feet.  What happens if I leave the room for restroom or other reasons?  Leaving the room for any reason will not cause any problem. As soon as your get back to the room, both devices will reconnect and will continue to send the sleep data. Can I use my phone during the sleep study?  Yes, you can use your phone as usual during the study. But it is recommended to put your watchpat one on when you are ready to go to bed.  How will I get my study results?  A soon as you completed your study, your sleep data will be sent to the provider. They will then share the results with you when they are ready.     Follow-Up: At [facility], you and your health needs are our priority.  As part of our continuing mission to provide you with exceptional heart care, we have created designated Provider Care Teams.  These Care Teams include your primary Cardiologist (physician) and Advanced Practice Providers (APPs -  Physician Assistants and Nurse Practitioners) who all work together to provide you with the care you need, when you need it.  We recommend signing up for the patient portal called "MyChart".  Sign up information is provided on this After Visit Summary.  MyChart is used to connect with  patients for Virtual Visits (Telemedicine).  Patients are able to view lab/test results, encounter notes, upcoming appointments, etc.  Non-urgent messages can be sent to your provider as well.   To learn more about what you can do with MyChart, go to [email or web address].    Your next appointment:    6 months with Dr. Rennis Golden

## 2022-11-27 NOTE — Progress Notes (Signed)
LIPID CLINIC CONSULT NOTE  Chief Complaint:  Follow-up dyslipidemia  Primary Care Physician: Bradd Canary, MD  Primary Cardiologist:  Garwin Brothers, MD  HPI:  Destiny [name] is a 72 y.o. female who is being seen today for the evaluation of dyslipidemia at the request of Bradd Canary, MD. This is a pleasant 72 year old female with past medical history significant for moderate obesity, hypertension, dyslipidemia, hypothyroidism, palpitations and prior chest pain which led to heart catheterization in November 2019.  This demonstrated a 50% proximal circumflex lesion which was not significant by FFR, a small 75% ostial diagonal lesion as well as a mid LAD lesion of 25% stenosis.  LVEDP was elevated at 20 mmHg and LVEF was normal.  Based on these findings aggressive medical therapy was recommended.  At that time her chest pain seemed somewhat atypical and according to her still persists.  It feels like a twinge and is present at rest and with exertion.  She was however found to have a markedly abnormal lipid profile.  In addition she is struggled with longstanding hypothyroidism and recently had converted from Armour Thyroid over to Synthroid.  Her dose was increased up to 100 mcg daily by her PCP in July.  Was a month ago which showed total cholesterol 263, HDL 39, triglycerides 216, and LDL 183.  He has had triglycerides over 400 in the past.  She reports her diet has been generally unhealthy and does eat a lot of fast food, fried foods and saturated fats.  Recently she started the 56day diet which she says is good so far and she feels like she would likely see a change in her lipids.  She also has a history of statin intolerance.  She has been tried on both rosuvastatin, atorvastatin, and ezetimibe, all of which have caused significant worsening of myalgias.  04/24/2019  Destiny [name] returns today for follow-up of dyslipidemia.  She successfully been using Repatha which is both been affordable  and easy to use.  She has no significant side effects.  She has discontinued her red yeast rice.  Unfortunately she has not had repeat lipid testing.  We will plan to do that today since she is fasting.  She denies any side effects such as myalgia.  10/23/2019  Destiny [name] is seen today for follow-up.  She is done well on Repatha.  Total cholesterol now 128, triglycerides 158, HDL 44 and LDL 51.  She reports some occasional chest discomfort, not associated with exertion always or relieved by rest.  It is not limited her activities.  She has not required any nitro.  She has not had a new prescription in a while.  Her cardiologist Dr. Tomie China saw her last in 2019.  I would advise follow-up there.  12/06/2020  Destiny [name] is seen today in follow-up.  Overall she continues to do well on Repatha.  Recent lipids showed a slight increase in triglycerides however she had some difficulty with her thyroid and was somewhat hypothyroid, now it has been repleted.  Total cholesterol 128, triglycerides 211, HDL 38 and LDL 56.  Overall she says she feels well denies chest pain or worsening shortness of breath.  05/07/2022  Destiny [name] returns today for follow-up.  She has had a significant improvement in her lipids on Repatha.  She is tolerating this quite well.  Total cholesterol now 130, triglycerides 84, HDL 54 and LDL 60.  She denies any chest pain or shortness of breath.  EKG today  is stable.  11/27/2022  Destiny [name] returns today for follow-up.  She had called in the office because her Apple Watch indicated an episode of atrial fibrillation.  She reports she has had episodes of palpitations going on for several years and decided to do an EKG with her Apple Watch at night because she felt strange.  This did show atrial fibrillation.  The overall duration was fairly short however given her age and comorbidities her CHA2DS2-VASc score is 4 and is increased risk of stroke.  EKG today shows she is back in sinus rhythm.  PMHx:   Past Medical History:  Diagnosis Date   Abnormal blood chemistry    Arthropathy    Back pain    Breast cyst    Cellulitis    Chest discomfort    Chronic bronchitis (HCC)    Constipation    Disturbance of skin sensation    Edema    Encounter for screening and preventative care 06/17/2016   Fatty liver    GERD (gastroesophageal reflux disease)    Hiatal hernia with gastroesophageal reflux 06/17/2016   High risk medication use    Hoarseness 06/15/2016   Hyperglycemia    Hyperglycemia 12/21/2016   Hyperlipidemia    Hyperlipidemia, mixed 06/15/2016   Hypertension    Hypopotassemia    Hypothyroidism    Joint pain    Menopause    Obesity    Palpitations    Positive ANA (antinuclear antibody)    Pre-diabetes    Shoulder pain, right    Skin cancer    Leg   Sleep apnea    denies   SOB (shortness of breath)    Thyroid disease 12/11/2014   Thyroid nodule    Urinary incontinence 04/01/2017   Vitamin D deficiency    Vitamin D deficiency     Past Surgical History:  Procedure Laterality Date   BREAST CYST ASPIRATION Left    30 years ago  1970's or 1980's   CORONARY PRESSURE/FFR STUDY N/A 04/25/2018   Procedure: INTRAVASCULAR PRESSURE WIRE/FFR STUDY;  Surgeon: Corky Crafts, MD;  Location: [facility] INVASIVE CV LAB;  Service: Cardiovascular;  Laterality: N/A;   LEFT HEART CATH AND CORONARY ANGIOGRAPHY N/A 04/25/2018   Procedure: LEFT HEART CATH AND CORONARY ANGIOGRAPHY;  Surgeon: Corky Crafts, MD;  Location: [facility] INVASIVE CV LAB;  Service: Cardiovascular;  Laterality: N/A;   LIPOSUCTION  1990   over gluteal area    VEIN SURGERY Left 2022   WISDOM TOOTH EXTRACTION     and all upper teeth    FAMHx:  Family History  Problem Relation Age of Onset   Irregular heart beat Mother    Heart disease Mother    Rheumatic fever Mother    Hypertension Father    Ulcers Father    Hyperlipidemia Father    Stroke Maternal Grandmother    Diabetes Maternal Grandmother    Hypertension  Paternal Grandmother    Ulcers Paternal Grandfather     SOCHx:   reports that she quit smoking about 13 years ago. Her smoking use included cigarettes. She has a 27.00 pack-year smoking history. She has never used smokeless tobacco. She reports current alcohol use. She reports that she does not use drugs.  ALLERGIES:  Allergies  Allergen Reactions   Crestor [Rosuvastatin Calcium] Other (See Comments)    myalgias   Diclofenac Itching, Nausea Only and Other (See Comments)    Wheezing   Hydrochlorothiazide     Wheezing. Losing teeth.   Statins  ROS: Pertinent items noted in HPI and remainder of comprehensive ROS otherwise negative.  HOME MEDS: Current Outpatient Medications on File Prior to Visit  Medication Sig Dispense Refill   aspirin EC 81 MG tablet Take 1 tablet (81 mg total) by mouth daily. 90 tablet 3   betamethasone dipropionate 0.05 % cream Apply 1 application topically 2 (two) times daily as needed (rash).     furosemide (LASIX) 20 MG tablet Take 1 tablet (20 mg total) by mouth daily as needed for fluid or edema. prn weight gain>3#/24 hours, SOB 90 tablet 1   irbesartan (AVAPRO) 150 MG tablet Take 1 tablet (150 mg total) by mouth daily. 90 tablet 1   metoprolol succinate (TOPROL-XL) 25 MG 24 hr tablet Take 1 tablet (25 mg total) by mouth daily. 90 tablet 1   nitroGLYCERIN (NITROSTAT) 0.4 MG SL tablet Place 1 tablet (0.4 mg total) under the tongue every 5 (five) minutes as needed for chest pain. 25 tablet 3   REPATHA SURECLICK 140 MG/ML SOAJ INJECT 1 PEN (140MG ) SUBCUTANEOUSLY EVERY 14 DAYS 6 mL 3   thyroid (ARMOUR) 120 MG tablet Take 120 mg by mouth daily before breakfast.     Vibegron (GEMTESA) 75 MG TABS Take 75 mg by mouth daily. 30 tablet 11   Vitamin D, Ergocalciferol, (DRISDOL) 1.25 MG (50000 UNIT) CAPS capsule TAKE ONE CAPSULE BY MOUTH EVERY SEVEN DAYS 12 capsule 3   albuterol (VENTOLIN HFA) 108 (90 Base) MCG/ACT inhaler Inhale 2 puffs into the lungs every 6 (six)  hours as needed for wheezing or shortness of breath. (Patient not taking: Reported on 11/27/2022) 18 g 5   benzonatate (TESSALON) 100 MG capsule Take 1 capsule (100 mg total) by mouth 2 (two) times daily as needed for cough. (Patient not taking: Reported on 11/27/2022) 20 capsule 1   docusate sodium (DOK) 100 MG capsule Take 1 capsule (100 mg total) by mouth 2 (two) times daily as needed for mild constipation. (Patient not taking: Reported on 11/27/2022) 180 capsule 1   Zinc 100 MG TABS Take 100 mg by mouth daily. (Patient not taking: Reported on 11/27/2022)     No current facility-administered medications on file prior to visit.    LABS/IMAGING: No results found for this or any previous visit (from the past 48 hour(s)). No results found.  LIPID PANEL:    Component Value Date/Time   CHOL 130 05/04/2022 1201   TRIG 84 05/04/2022 1201   HDL 54 05/04/2022 1201   CHOLHDL 2.4 05/04/2022 1201   CHOLHDL 3 04/18/2020 1418   VLDL 47.4 (H) 04/18/2020 1418   LDLCALC 60 05/04/2022 1201   LDLCALC 183 (H) 12/08/2018 1531   LDLDIRECT 71.0 04/18/2020 1418    WEIGHTS: Wt Readings from Last 3 Encounters:  11/27/22 228 lb 12.8 oz (103.8 kg)  05/07/22 218 lb 9.6 oz (99.2 kg)  04/21/22 222 lb 6.4 oz (100.9 kg)    VITALS: BP 126/62 (BP Location: Left Arm, Patient Position: Sitting, Cuff Size: Normal)   Pulse 66   Ht 5\' 3"  (1.6 m)   Wt 228 lb 12.8 oz (103.8 kg)   SpO2 97%   BMI 40.53 kg/m   EXAM: General appearance: alert and no distress Lungs: clear to auscultation bilaterally Heart: regular rate and rhythm Extremities: extremities normal, atraumatic, no cyanosis or edema Neurologic: Grossly normal  EKG: EKG Interpretation  Date/Time:  Friday November 27 2022 14:09:14 EDT Ventricular Rate:  66 PR Interval:  172 QRS Duration: 106 QT Interval:  412  QTC Calculation: 431 R Axis:   -79 Text Interpretation: Normal sinus rhythm Incomplete right bundle branch block Left anterior fascicular block  Cannot rule out Anterior infarct (cited on or before 04-Sep-2020) When compared with ECG of 04-Sep-2020 05:47, No significant change was found Confirmed by Zoila Shutter [phone]) on 11/27/2022 2:12:51 PM    ASSESSMENT: Paroxysmal atrial fibrillation-CHA2DS2-VASc score 4 Mixed dyslipidemia with elevated LDL and triglycerides Coronary artery disease with moderate to severe disease by cath (04/2018)-managed medically Morbid obesity Hypertension Palpitations Hypothyroidism At risk for obstructive sleep apnea-STOP-BANG score of 7  PLAN: 1.   Destiny [name] has symptomatic palpitations and was noted on her Apple Watch to have atrial fibrillation.  Given her higher CHA2DS2-VASc score I would recommend anticoagulation.  She has been maintained on low-dose aspirin which will stop and switch her over to Eliquis 5 mg twice daily.  Will order some repeat labs at follow.  Finally, she is noted to have morbid obesity, thick neck, hypertension, A-fib and other risk factors for sleep apnea including loud snoring and witnessed apnea.  I would like to order a home sleep study for her elevated stop-bang score of 7.  She is agreeable to this as there is a greater likelihood of obstructive sleep apnea.  Plan follow-up with me in 6 months.  Chrystie Nose, MD, [facility], FACP    [facility]  Medical Director of the Advanced Lipid Disorders &  Cardiovascular Risk Reduction Clinic Diplomate of the American Board of Clinical Lipidology Attending Cardiologist  Direct Dial: [phone]  Fax: [phone]  Website:  [email or web address]  Destiny [name] 11/27/2022, 2:12 PM

## 2022-12-01 ENCOUNTER — Other Ambulatory Visit: Payer: Self-pay

## 2022-12-01 ENCOUNTER — Telehealth: Payer: Self-pay | Admitting: Family Medicine

## 2022-12-01 DIAGNOSIS — I10 Essential (primary) hypertension: Secondary | ICD-10-CM

## 2022-12-01 MED ORDER — METOPROLOL SUCCINATE ER 25 MG PO TB24
25.0000 mg | ORAL_TABLET | Freq: Every day | ORAL | 1 refills | Status: DC
Start: 2022-12-01 — End: 2023-01-19

## 2022-12-01 MED ORDER — IRBESARTAN 150 MG PO TABS: 150.0000 mg | ORAL_TABLET | Freq: Every day | ORAL | 1 refills | Status: DC

## 2022-12-01 MED ORDER — FUROSEMIDE 20 MG PO TABS: 20.0000 mg | ORAL_TABLET | Freq: Every day | ORAL | 1 refills | Status: DC | PRN

## 2022-12-01 NOTE — Telephone Encounter (Signed)
Refills has been sent 

## 2022-12-01 NOTE — Telephone Encounter (Signed)
Prescription Request  12/01/2022  Is this a "Controlled Substance" medicine? No  LOV: Visit date not found  What is the name of the medication or equipment?   irbesartan (AVAPRO) 150 MG tablet [[id number]]   furosemide (LASIX) 20 MG tablet [[id number]]   metoprolol succinate (TOPROL-XL) 25 MG 24 hr tablet [[id number]]   Vitamin D, Ergocalciferol, (DRISDOL) 1.25 MG (50000 UNIT) CAPS capsule [[id number]]   Have you contacted your pharmacy to request a refill? No   Which pharmacy would you like this sent to?   [facility] - [place], [place] - [address] [facility] [address] [place] [address] Phone: [phone] Fax: [phone]  Patient notified that their request is being sent to the clinical staff for review and that they should receive a response within 2 business days.   Please advise at [facility] [phone]

## 2022-12-05 ENCOUNTER — Other Ambulatory Visit: Payer: Self-pay

## 2022-12-05 ENCOUNTER — Emergency Department (HOSPITAL_BASED_OUTPATIENT_CLINIC_OR_DEPARTMENT_OTHER): Payer: Medicare PPO

## 2022-12-05 ENCOUNTER — Emergency Department (HOSPITAL_BASED_OUTPATIENT_CLINIC_OR_DEPARTMENT_OTHER)
Admission: EM | Admit: 2022-12-05 | Discharge: 2022-12-05 | Disposition: A | Discharge status: Home / Self Care | Payer: Medicare PPO | Attending: Emergency Medicine | Admitting: Emergency Medicine

## 2022-12-05 DIAGNOSIS — Z79890 Hormone replacement therapy (postmenopausal): Secondary | ICD-10-CM | POA: Diagnosis not present

## 2022-12-05 DIAGNOSIS — I10 Essential (primary) hypertension: Secondary | ICD-10-CM | POA: Insufficient documentation

## 2022-12-05 DIAGNOSIS — Z79899 Other long term (current) drug therapy: Secondary | ICD-10-CM | POA: Insufficient documentation

## 2022-12-05 DIAGNOSIS — Z85828 Personal history of other malignant neoplasm of skin: Secondary | ICD-10-CM | POA: Insufficient documentation

## 2022-12-05 DIAGNOSIS — M79602 Pain in left arm: Secondary | ICD-10-CM | POA: Diagnosis not present

## 2022-12-05 DIAGNOSIS — Z7901 Long term (current) use of anticoagulants: Secondary | ICD-10-CM | POA: Insufficient documentation

## 2022-12-05 DIAGNOSIS — E039 Hypothyroidism, unspecified: Secondary | ICD-10-CM | POA: Insufficient documentation

## 2022-12-05 DIAGNOSIS — I700 Atherosclerosis of aorta: Secondary | ICD-10-CM | POA: Diagnosis not present

## 2022-12-05 LAB — CBC WITH DIFFERENTIAL/PLATELET
Abs Immature Granulocytes: 0.01 10*3/uL (ref 0.00–0.07)
Basophils Absolute: 0.1 10*3/uL (ref 0.0–0.1)
Basophils Relative: 1 %
Eosinophils Absolute: 0.2 10*3/uL (ref 0.0–0.5)
Eosinophils Relative: 3 %
HCT: 39.9 % (ref 36.0–46.0)
Hemoglobin: 13.3 g/dL (ref 12.0–15.0)
Immature Granulocytes: 0 %
Lymphocytes Relative: 33 %
Lymphs Abs: 1.7 10*3/uL (ref 0.7–4.0)
MCH: 30.1 pg (ref 26.0–34.0)
MCHC: 33.3 g/dL (ref 30.0–36.0)
MCV: 90.3 fL (ref 80.0–100.0)
Monocytes Absolute: 0.5 10*3/uL (ref 0.1–1.0)
Monocytes Relative: 9 %
Neutro Abs: 2.7 10*3/uL (ref 1.7–7.7)
Neutrophils Relative %: 54 %
Platelets: 218 10*3/uL (ref 150–400)
RBC: 4.42 MIL/uL (ref 3.87–5.11)
RDW: 13.3 % (ref 11.5–15.5)
WBC: 5.1 10*3/uL (ref 4.0–10.5)
nRBC: 0 % (ref 0.0–0.2)

## 2022-12-05 LAB — TROPONIN I (HIGH SENSITIVITY)
Troponin I (High Sensitivity): 3 ng/L (ref <18)
Troponin I (High Sensitivity): 3 ng/L (ref <18)

## 2022-12-05 LAB — BASIC METABOLIC PANEL
Anion gap: 8 (ref 5–15)
BUN: 15 mg/dL (ref 8–23)
CO2: 25 mmol/L (ref 22–32)
Calcium: 8.9 mg/dL (ref 8.9–10.3)
Chloride: 103 mmol/L (ref 98–111)
Creatinine, Ser: 0.77 mg/dL (ref 0.44–1.00)
GFR, Estimated: >60 mL/min (ref >60)
Glucose, Bld: 116 mg/dL — ABNORMAL HIGH (ref 70–99)
Potassium: 3.9 mmol/L (ref 3.5–5.1)
Sodium: 136 mmol/L (ref 135–145)

## 2022-12-05 NOTE — ED Triage Notes (Signed)
Patient coming to ED for evaluation of hypertension and L arm pain.  Reports recently started Eliquis for atrial fibrillation.  States tonight she was monitoring her BP and noticed that it was elevated.  Has had nausea intermittently.  States pain to L arm has improved

## 2022-12-05 NOTE — ED Provider Notes (Signed)
[place] [facility] Provider Note   CSN: [id number] Arrival date & time: 12/05/22  4098     History  Chief Complaint  Patient presents with   Hypertension    Destiny [name] is a 72 y.o. female.  The history is provided by the patient.  Hypertension This is a chronic problem. The current episode started more than 1 week ago. The problem occurs constantly. The problem has been rapidly worsening. Pertinent negatives include no chest pain, no abdominal pain, no headaches and no shortness of breath. Associated symptoms comments: Left arm pain, has a new BP cuff.  Has recently started on Eliquis for AFIB. Nothing aggravates the symptoms. Nothing relieves the symptoms. She has tried nothing for the symptoms. The treatment provided no relief.  Patient with HTN and AFIB on medication including eliquis who had left arm pain and checked BP and it was high so patient came in for elevation.  No CP, No DOE, no SOB.  No n/v/d.  Past Medical History:  Diagnosis Date   Abnormal blood chemistry    Arthropathy    Back pain    Breast cyst    Cellulitis    Chest discomfort    Chronic bronchitis (HCC)    Constipation    Disturbance of skin sensation    Edema    Encounter for screening and preventative care 06/17/2016   Fatty liver    GERD (gastroesophageal reflux disease)    Hiatal hernia with gastroesophageal reflux 06/17/2016   High risk medication use    Hoarseness 06/15/2016   Hyperglycemia    Hyperglycemia 12/21/2016   Hyperlipidemia    Hyperlipidemia, mixed 06/15/2016   Hypertension    Hypopotassemia    Hypothyroidism    Joint pain    Menopause    Obesity    Palpitations    Positive ANA (antinuclear antibody)    Pre-diabetes    Shoulder pain, right    Skin cancer    Leg   Sleep apnea    denies   SOB (shortness of breath)    Thyroid disease 12/11/2014   Thyroid nodule    Urinary incontinence 04/01/2017   Vitamin D deficiency    Vitamin D  deficiency        Home Medications Prior to Admission medications   Medication Sig Start Date End Date Taking? Authorizing Provider  albuterol (VENTOLIN HFA) 108 (90 Base) MCG/ACT inhaler Inhale 2 puffs into the lungs every 6 (six) hours as needed for wheezing or shortness of breath. Patient not taking: Reported on 11/27/2022 11/01/18   Bradd Canary, MD  apixaban (ELIQUIS) 5 MG TABS tablet Take 1 tablet (5 mg total) by mouth 2 (two) times daily. 11/27/22   Hilty, Lisette Abu, MD  apixaban (ELIQUIS) 5 MG TABS tablet Take 1 tablet (5 mg total) by mouth 2 (two) times daily. 11/27/22   Hilty, Lisette Abu, MD  apixaban (ELIQUIS) 5 MG TABS tablet Take 1 tablet (5 mg total) by mouth 2 (two) times daily. 11/27/22   Hilty, Lisette Abu, MD  benzonatate (TESSALON) 100 MG capsule Take 1 capsule (100 mg total) by mouth 2 (two) times daily as needed for cough. Patient not taking: Reported on 11/27/2022 09/09/20   Bradd Canary, MD  betamethasone dipropionate 0.05 % cream Apply 1 application topically 2 (two) times daily as needed (rash).    [provider]  docusate sodium (DOK) 100 MG capsule Take 1 capsule (100 mg total) by mouth 2 (  two) times daily as needed for mild constipation. Patient not taking: Reported on 11/27/2022 04/15/21   Bradd Canary, MD  furosemide (LASIX) 20 MG tablet Take 1 tablet (20 mg total) by mouth daily as needed for fluid or edema. prn weight gain>3#/24 hours, SOB 12/01/22   Bradd Canary, MD  irbesartan (AVAPRO) 150 MG tablet Take 1 tablet (150 mg total) by mouth daily. 12/01/22   Bradd Canary, MD  metoprolol succinate (TOPROL-XL) 25 MG 24 hr tablet Take 1 tablet (25 mg total) by mouth daily. 12/01/22   Bradd Canary, MD  nitroGLYCERIN (NITROSTAT) 0.4 MG SL tablet Place 1 tablet (0.4 mg total) under the tongue every 5 (five) minutes as needed for chest pain. 01/24/21   Hilty, Lisette Abu, MD  REPATHA SURECLICK 140 MG/ML SOAJ INJECT 1 PEN (140MG ) SUBCUTANEOUSLY EVERY 14 DAYS  04/13/22   Hilty, Lisette Abu, MD  thyroid (ARMOUR) 120 MG tablet Take 120 mg by mouth daily before breakfast.    [provider]  Vibegron (GEMTESA) 75 MG TABS Take 75 mg by mouth daily. 10/28/21   Stoneking, Danford Bad., MD  Vitamin D, Ergocalciferol, (DRISDOL) 1.25 MG (50000 UNIT) CAPS capsule TAKE ONE CAPSULE BY MOUTH EVERY SEVEN DAYS 12/15/21   Bradd Canary, MD  Zinc 100 MG TABS Take 100 mg by mouth daily. Patient not taking: Reported on 11/27/2022    [provider]      Allergies    Crestor [rosuvastatin calcium], Diclofenac, Hydrochlorothiazide, and Statins    Review of Systems   Review of Systems  Constitutional:  Negative for fever.  HENT:  Negative for congestion.   Eyes:  Negative for redness.  Respiratory:  Negative for shortness of breath.   Cardiovascular:  Negative for chest pain.  Gastrointestinal:  Negative for abdominal pain.  Musculoskeletal:  Positive for arthralgias.  Neurological:  Negative for speech difficulty, weakness and headaches.  All other systems reviewed and are negative.   Physical Exam Updated Vital Signs BP (!) 157/82   Pulse 62   Temp 98.4 F (36.9 C) (Oral)   Resp 14   SpO2 98%  Physical Exam Vitals and nursing note reviewed.  Constitutional:      General: She is not in acute distress.    Appearance: Normal appearance. She is well-developed.  HENT:     Head: Normocephalic and atraumatic.     Nose: Nose normal.  Eyes:     Pupils: Pupils are equal, round, and reactive to light.  Cardiovascular:     Rate and Rhythm: Normal rate and regular rhythm.     Pulses: Normal pulses.     Heart sounds: Normal heart sounds.  Pulmonary:     Effort: Pulmonary effort is normal. No respiratory distress.     Breath sounds: Normal breath sounds.  Abdominal:     General: Bowel sounds are normal. There is no distension.     Palpations: Abdomen is soft.     Tenderness: There is no abdominal tenderness. There is no guarding or rebound.   Genitourinary:    Vagina: No vaginal discharge.  Musculoskeletal:        General: Normal range of motion.     Cervical back: Normal range of motion and neck supple.  Skin:    General: Skin is warm and dry.     Capillary Refill: Capillary refill takes less than 2 seconds.     Findings: No erythema or rash.  Neurological:     General: No focal  deficit present.     Mental Status: She is alert and oriented to person, place, and time.     Deep Tendon Reflexes: Reflexes normal.  Psychiatric:        Mood and Affect: Mood normal.     ED Results / Procedures / Treatments   Labs (all labs ordered are listed, but only abnormal results are displayed) Results for orders placed or performed during the hospital encounter of 12/05/22  CBC with Differential  Result Value Ref Range   WBC 5.1 4.0 - 10.5 K/uL   RBC 4.42 3.87 - 5.11 MIL/uL   Hemoglobin 13.3 12.0 - 15.0 g/dL   HCT 45.4 09.8 - 11.9 %   MCV 90.3 80.0 - 100.0 fL   MCH 30.1 26.0 - 34.0 pg   MCHC 33.3 30.0 - 36.0 g/dL   RDW 14.7 82.9 - 56.2 %   Platelets 218 150 - 400 K/uL   nRBC 0.0 0.0 - 0.2 %   Neutrophils Relative % 54 %   Neutro Abs 2.7 1.7 - 7.7 K/uL   Lymphocytes Relative 33 %   Lymphs Abs 1.7 0.7 - 4.0 K/uL   Monocytes Relative 9 %   Monocytes Absolute 0.5 0.1 - 1.0 K/uL   Eosinophils Relative 3 %   Eosinophils Absolute 0.2 0.0 - 0.5 K/uL   Basophils Relative 1 %   Basophils Absolute 0.1 0.0 - 0.1 K/uL   Immature Granulocytes 0 %   Abs Immature Granulocytes 0.01 0.00 - 0.07 K/uL  Basic metabolic panel  Result Value Ref Range   Sodium 136 135 - 145 mmol/L   Potassium 3.9 3.5 - 5.1 mmol/L   Chloride 103 98 - 111 mmol/L   CO2 25 22 - 32 mmol/L   Glucose, Bld 116 (H) 70 - 99 mg/dL   BUN 15 8 - 23 mg/dL   Creatinine, Ser 1.30 0.44 - 1.00 mg/dL   Calcium 8.9 8.9 - 86.5 mg/dL   GFR, Estimated >78 >46 mL/min   Anion gap 8 5 - 15  Troponin I (High Sensitivity)  Result Value Ref Range   Troponin I (High Sensitivity)  3 <18 ng/L  Troponin I (High Sensitivity)  Result Value Ref Range   Troponin I (High Sensitivity) 3 <18 ng/L   DG Chest Portable 1 View  Result Date: 12/05/2022 CLINICAL DATA:  72 year old female with history of hypertension and left arm pain. EXAM: PORTABLE CHEST 1 VIEW COMPARISON:  Chest x-ray 08/22/2020. FINDINGS: Lung volumes are normal. No consolidative airspace disease. No pleural effusions. No pneumothorax. No pulmonary nodule or mass noted. Pulmonary vasculature and the cardiomediastinal silhouette are within normal limits. Atherosclerosis in the thoracic aorta. IMPRESSION: 1.  No radiographic evidence of acute cardiopulmonary disease. 2. Aortic atherosclerosis. Electronically Signed   By: Trudie Reed M.D.   On: 12/05/2022 05:11    EKG NSR 666 with incomplete RBBB  Radiology DG Chest Portable 1 View  Result Date: 12/05/2022 CLINICAL DATA:  72 year old female with history of hypertension and left arm pain. EXAM: PORTABLE CHEST 1 VIEW COMPARISON:  Chest x-ray 08/22/2020. FINDINGS: Lung volumes are normal. No consolidative airspace disease. No pleural effusions. No pneumothorax. No pulmonary nodule or mass noted. Pulmonary vasculature and the cardiomediastinal silhouette are within normal limits. Atherosclerosis in the thoracic aorta. IMPRESSION: 1.  No radiographic evidence of acute cardiopulmonary disease. 2. Aortic atherosclerosis. Electronically Signed   By: Trudie Reed M.D.   On: 12/05/2022 05:11    Procedures Procedures    Medications Ordered in  ED Medications - No data to display  ED Course/ Medical Decision Making/ A&P                             Medical Decision Making Patient with HTN and elevated BP and arm pain.    Amount and/or Complexity of Data Reviewed Labs: ordered.    Details: All labs reviewed: 2 negative troponins 3/3. Normal white count 5.1, normal hemoglobin 13.3, normal platelets.  Normal sodium 136, normal potassium 3.9, normal creatinine   Radiology: ordered and independent interpretation performed.    Details: Negative CXR ECG/medicine tests: ordered and independent interpretation performed. Decision-making details documented in ED Course.  Risk Risk Details: Ruled out for MI in the ED.  BP normalized on own without intervention. I suspect MSk pain in the arm.  Very well appearing.  Stable for discharge with close follow up.      Final Clinical Impression(s) / ED Diagnoses Final diagnoses:  Primary hypertension   Return for intractable cough, coughing up blood, fevers > 100.4 unrelieved by medication, shortness of breath, intractable vomiting, chest pain, shortness of breath, weakness, numbness, changes in speech, facial asymmetry, abdominal pain, passing out, Inability to tolerate liquids or food, cough, altered mental status or any concerns. No signs of systemic illness or infection. The patient is nontoxic-appearing on exam and vital signs are within normal limits.  I have reviewed the triage vital signs and the nursing notes. Pertinent labs & imaging results that were available during my care of the patient were reviewed by me and considered in my medical decision making (see chart for details). After history, exam, and medical workup I feel the patient has been appropriately medically screened and is safe for discharge home. Pertinent diagnoses were discussed with the patient. Patient was given return precautions.  Rx / DC Orders ED Discharge Orders     None         Jaquana Geiger, MD 12/05/22 [phone]

## 2022-12-08 ENCOUNTER — Telehealth: Payer: Self-pay | Admitting: Internal Medicine

## 2022-12-08 NOTE — Telephone Encounter (Signed)
Patient calling in about the heart monitor that she received. She has some question and would like for the nurse to call her back. Please advise

## 2022-12-08 NOTE — Telephone Encounter (Signed)
Patient calling about her sleep study she is supposed to do at home. She states she was supposed to get a call that night of her visit 6/21 to say if she is able to use it. She was because she has not been called. I did inform her that we have to get it approved by her insurance before she can use the monitor.

## 2022-12-14 ENCOUNTER — Telehealth: Payer: Self-pay

## 2022-12-14 NOTE — Telephone Encounter (Signed)
Patient called requesting apt with Dr. Pete Glatter.  Patient would like to see Dr. Pete Glatter in [place], office number provided.

## 2022-12-15 NOTE — Telephone Encounter (Signed)
**Note De-Identified Khaylee Mcevoy Obfuscation** I called [employer] and was advised by Jesse Fall. that a [place] PA is not req by the pts plan. Reference #: [id number]  I called [employer] and received a pre-recorded message stating that no PA is required for services rendered unless it is for organ/bone, mental, and/or DME.  I called the pt and provided her with her Watchpat PIN #: "[id number]" so she can perform her HST. She verbalized understanding and thanked me for calling her back.

## 2022-12-23 ENCOUNTER — Telehealth: Payer: Self-pay | Admitting: Urology

## 2022-12-23 ENCOUNTER — Other Ambulatory Visit: Payer: Self-pay | Admitting: Urology

## 2022-12-23 DIAGNOSIS — N3946 Mixed incontinence: Secondary | ICD-10-CM

## 2022-12-23 MED ORDER — GEMTESA 75 MG PO TABS: 75.0000 mg | ORAL_TABLET | Freq: Every day | ORAL | 5 refills | Status: DC

## 2022-12-23 NOTE — Telephone Encounter (Signed)
In her VM she did not leave the name of the Medication, when I tried calling her to get the name the phone kept ringing and there was not an option to leave a voicemail .

## 2022-12-23 NOTE — Telephone Encounter (Signed)
Needs a refill on medication

## 2022-12-27 ENCOUNTER — Encounter (INDEPENDENT_AMBULATORY_CARE_PROVIDER_SITE_OTHER): Payer: Medicare PPO | Admitting: Cardiology

## 2022-12-27 ENCOUNTER — Ambulatory Visit: Payer: Medicare PPO | Attending: Internal Medicine

## 2022-12-27 DIAGNOSIS — G4733 Obstructive sleep apnea (adult) (pediatric): Secondary | ICD-10-CM | POA: Diagnosis not present

## 2022-12-27 DIAGNOSIS — G4719 Other hypersomnia: Secondary | ICD-10-CM

## 2022-12-27 DIAGNOSIS — I480 Paroxysmal atrial fibrillation: Secondary | ICD-10-CM

## 2022-12-27 DIAGNOSIS — R0683 Snoring: Secondary | ICD-10-CM

## 2022-12-30 ENCOUNTER — Ambulatory Visit: Payer: Medicare PPO | Admitting: Urology

## 2022-12-31 ENCOUNTER — Ambulatory Visit: Payer: Medicare PPO | Admitting: Urology

## 2022-12-31 NOTE — Procedures (Signed)
Patient Information Study Date: 12/27/2022 Patient Name: Destiny [name] Patient ID: [id number] Birth Date: [birth year] Age: 72 Gender: Female BMI: 40.6 (W=229 lb, H=5' 3'')  Referring Physician: K. [place] Hilty, MD  TEST DESCRIPTION: Home sleep apnea testing was completed using the WatchPat, a Type 1 device, utilizing peripheral arterial tonometry (PAT), chest movement, actigraphy, pulse oximetry, pulse rate, body position and snore.  AHI was calculated with apnea and hypopnea using valid sleep time as the denominator. RDI includes apneas, hypopneas, and RERAs.  The data acquired and the scoring of sleep and all associated events were performed in accordance with the recommended standards and specifications as outlined in the AASM Manual for the Scoring of Sleep and Associated Events 2.2.0 (2015).  FINDINGS:  1.  Severe Obstructive Sleep Apnea with AHI 53.9/hr.   2.  No Central Sleep Apnea with pAHIc 1.2/hr.  3.  Oxygen desaturations as low as 80%.  4.  Severe snoring was present. O2 sats were < 88% for 1.9 min.  5.  Total sleep time was 7 hrs and 26 min.  6.  6.1% of total sleep time was spent in REM sleep.   7.  Normal sleep onset latency at 11 min.   8.  Prolonged REM sleep onset latency at 297 min.   9.  Total awakenings were 9.  10. Arrhythmia detection:  None  DIAGNOSIS:   Severe Obstructive Sleep Apnea (G47.33)  RECOMMENDATIONS:   1.  Clinical correlation of these findings is necessary.  The decision to treat obstructive sleep apnea (OSA) is usually based on the presence of apnea symptoms or the presence of associated medical conditions such as Hypertension, Congestive Heart Failure, Atrial Fibrillation or Obesity.  The most common symptoms of OSA are snoring, gasping for breath while sleeping, daytime sleepiness and fatigue.   2.  Initiating apnea therapy is recommended given the presence of symptoms and/or associated conditions. Recommend proceeding with one of the  following:     a.  Auto-CPAP therapy with a pressure range of 5-20cm H2O.     b.  An oral appliance (OA) that can be obtained from certain dentists with expertise in sleep medicine.  These are primarily of use in non-obese patients with mild and moderate disease.     c.  An ENT consultation which may be useful to look for specific causes of obstruction and possible treatment options.     d.  If patient is intolerant to PAP therapy, consider referral to ENT for evaluation for hypoglossal nerve stimulator.   3.  Close follow-up is necessary to ensure success with CPAP or oral appliance therapy for maximum benefit.  4.  A follow-up oximetry study on CPAP is recommended to assess the adequacy of therapy and determine the need for supplemental oxygen or the potential need for Bi-level therapy.  An arterial blood gas to determine the adequacy of baseline ventilation and oxygenation should also be considered.  5.  Healthy sleep recommendations include:  adequate nightly sleep (normal 7-9 hrs/night), avoidance of caffeine after noon and alcohol near bedtime, and maintaining a sleep environment that is cool, dark and quiet.  6.  Weight loss for overweight patients is recommended.  Even modest amounts of weight loss can significantly improve the severity of sleep apnea.  7.  Snoring recommendations include:  weight loss where appropriate, side sleeping, and avoidance of alcohol before bed.  8.  Operation of motor vehicle should be avoided when sleepy.  Signature: Armanda Magic, MD; [facility]; Diplomat, American Board  of Sleep Medicine Electronically Signed: 12/31/2022 4:08:56 PM

## 2023-01-07 ENCOUNTER — Ambulatory Visit: Payer: Medicare PPO | Admitting: Urology

## 2023-01-14 ENCOUNTER — Ambulatory Visit (INDEPENDENT_AMBULATORY_CARE_PROVIDER_SITE_OTHER): Payer: Medicare PPO | Admitting: Urology

## 2023-01-14 ENCOUNTER — Encounter: Payer: Self-pay | Admitting: Urology

## 2023-01-14 VITALS — BP 166/80 | HR 98 | Ht 63.0 in | Wt 223.0 lb

## 2023-01-14 DIAGNOSIS — R350 Frequency of micturition: Secondary | ICD-10-CM | POA: Diagnosis not present

## 2023-01-14 DIAGNOSIS — N3946 Mixed incontinence: Secondary | ICD-10-CM | POA: Diagnosis not present

## 2023-01-14 NOTE — Progress Notes (Signed)
Assessment: 1. Mixed incontinence urge and stress   2. Urine frequency     Plan: Continue Gemtesa. I discussed other options for management of her mixed incontinence including alternative medications, PTNS, sacral neuro modulation, and chemodenervation with botulinum toxin. She is interested in pursuing PTNS.  Information provided. Will arrange for PTNS after insurance approval obtained.  Chief Complaint: Chief Complaint  Patient presents with   Urinary Incontinence    HPI: Destiny [name] is a 72 y.o. female who presents for continued evaluation of urinary incontinence.   At her initial visit in 10/22, she reported symptoms for approximately 7 years with gradual worsening.  She reported urinary frequency, voiding 3-4 times per hour, urgency, nocturia 1-2 times, urge incontinence and occasional unaware incontinence.  She reported some leakage with stress incontinence as well.  She also reported a decreased force of stream.  She had tried Kegel's in the past.  She was not using any pads.  No dysuria or gross hematuria.  No recent UTIs.  No prior medical therapy. PVR = 115 mL She was given a trial of Myrbetriq 50 mg daily. At her visit in November 2022, she reported no change in her symptoms with the Myrbetriq.  She continued to have urgency, frequency, and urge incontinence.  No dysuria or gross hematuria.   She was subsequently changed to Gemtesa 75 mg daily.  She returns today for follow-up.  She has continued on [place].  She has noted improvement in her symptoms with decreased frequency, urgency, and decrease incontinence.  She continues to have some episodes of incontinence without awareness.  No dysuria or gross hematuria.   Portions of the above documentation were copied from a prior visit for review purposes only.  Allergies: Allergies  Allergen Reactions   Crestor [Rosuvastatin Calcium] Other (See Comments)    myalgias   Diclofenac Itching, Nausea Only and Other (See  Comments)    Wheezing   Hydrochlorothiazide     Wheezing. Losing teeth.   Statins     PMH: Past Medical History:  Diagnosis Date   Abnormal blood chemistry    Arthropathy    Back pain    Breast cyst    Cellulitis    Chest discomfort    Chronic bronchitis (HCC)    Constipation    Disturbance of skin sensation    Edema    Encounter for screening and preventative care 06/17/2016   Fatty liver    GERD (gastroesophageal reflux disease)    Hiatal hernia with gastroesophageal reflux 06/17/2016   High risk medication use    Hoarseness 06/15/2016   Hyperglycemia    Hyperglycemia 12/21/2016   Hyperlipidemia    Hyperlipidemia, mixed 06/15/2016   Hypertension    Hypopotassemia    Hypothyroidism    Joint pain    Menopause    Obesity    Palpitations    Positive ANA (antinuclear antibody)    Pre-diabetes    Shoulder pain, right    Skin cancer    Leg   Sleep apnea    denies   SOB (shortness of breath)    Thyroid disease 12/11/2014   Thyroid nodule    Urinary incontinence 04/01/2017   Vitamin D deficiency    Vitamin D deficiency     PSH: Past Surgical History:  Procedure Laterality Date   BREAST CYST ASPIRATION Left    30 years ago  1970's or 1980's   CORONARY PRESSURE/FFR STUDY N/A 04/25/2018   Procedure: INTRAVASCULAR PRESSURE WIRE/FFR STUDY;  Surgeon:  Corky Crafts, MD;  Location[facility] INVASIVE CV LAB;  Service: Cardiovascular;  Laterality: N/A;   LEFT HEART CATH AND CORONARY ANGIOGRAPHY N/A 04/25/2018   Procedure: LEFT HEART CATH AND CORONARY ANGIOGRAPHY;  Surgeon: Corky Crafts, MD;  Location: [facility] INVASIVE CV LAB;  Service: Cardiovascular;  Laterality: N/A;   LIPOSUCTION  1990   over gluteal area    VEIN SURGERY Left 2022   WISDOM TOOTH EXTRACTION     and all upper teeth    SH: Social History   Tobacco Use   Smoking status: Former    Current packs/day: 0.00    Average packs/day: 1 pack/day for 27.0 years (27.0 ttl pk-yrs)    Types: Cigarettes    Start  date: 56    Quit date: 2011    Years since quitting: 13.6   Smokeless tobacco: Never  Vaping Use   Vaping status: Never Used  Substance Use Topics   Alcohol use: Yes    Alcohol/week: 0.0 standard drinks of alcohol   Drug use: No    ROS: Constitutional:  Negative for fever, chills, weight loss CV: Negative for chest pain, previous MI, hypertension Respiratory:  Negative for shortness of breath, wheezing, sleep apnea, frequent cough GI:  Negative for nausea, vomiting, bloody stool, GERD  PE: BP (!) 166/80   Pulse 98   Ht 5\' 3"  (1.6 m)   Wt 223 lb (101.2 kg)   BMI 39.50 kg/m  GENERAL APPEARANCE:  Well appearing, well developed, well nourished, NAD HEENT:  Atraumatic, normocephalic, oropharynx clear NECK:  Supple without lymphadenopathy or thyromegaly ABDOMEN:  Soft, non-tender, no masses EXTREMITIES:  Moves all extremities well, without clubbing, cyanosis, or edema NEUROLOGIC:  Alert and oriented x 3, normal gait, CN II-XII grossly intact MENTAL STATUS:  appropriate BACK:  Non-tender to palpation, No CVAT SKIN:  Warm, dry, and intact   Results: U/A: negative

## 2023-01-18 ENCOUNTER — Telehealth: Payer: Self-pay | Admitting: Family Medicine

## 2023-01-18 NOTE — Telephone Encounter (Signed)
Patient requesting refills on Irbesartan, Metoprolol and Vitamin D. Please send to [facility] order. Pt said they are not the typical pharmacy and you cannot request refills through them, you have to send it each month from the provider. Please call pt back to advise.

## 2023-01-19 ENCOUNTER — Other Ambulatory Visit: Payer: Self-pay

## 2023-01-19 DIAGNOSIS — I10 Essential (primary) hypertension: Secondary | ICD-10-CM

## 2023-01-19 MED ORDER — IRBESARTAN 150 MG PO TABS: 150.0000 mg | ORAL_TABLET | Freq: Every day | ORAL | 1 refills | Status: DC

## 2023-01-19 MED ORDER — VITAMIN D (ERGOCALCIFEROL) 1.25 MG (50000 UNIT) PO CAPS: ORAL_CAPSULE | ORAL | 3 refills | Status: DC

## 2023-01-19 MED ORDER — METOPROLOL SUCCINATE ER 25 MG PO TB24
25.0000 mg | ORAL_TABLET | Freq: Every day | ORAL | 1 refills | Status: DC
Start: 2023-01-19 — End: 2023-10-11

## 2023-01-19 NOTE — Telephone Encounter (Signed)
Refill sent.

## 2023-01-21 ENCOUNTER — Telehealth: Payer: Self-pay

## 2023-01-21 DIAGNOSIS — G4733 Obstructive sleep apnea (adult) (pediatric): Secondary | ICD-10-CM

## 2023-01-21 NOTE — Telephone Encounter (Signed)
-----   Message from Armanda Magic sent at 12/31/2022  4:10 PM EDT ----- Please let patient know that they have sleep apnea.  Recommend therapeutic CPAP titration for treatment of patient's sleep disordered breathing.  If unable to perform an in lab titration then initiate ResMed auto CPAP from 4 to 15cm H2O with heated humidity and mask of choice and overnight pulse ox on CPAP.

## 2023-01-21 NOTE — Telephone Encounter (Signed)
Notified patient of sleep study results and recommendations. All questions (If any) were answered. Patient verbalized understanding. CPAP Titration ordered today 01/21/23.

## 2023-01-28 ENCOUNTER — Other Ambulatory Visit: Payer: Self-pay | Admitting: *Deleted

## 2023-01-28 NOTE — Progress Notes (Signed)
Opened in error

## 2023-02-18 DIAGNOSIS — E039 Hypothyroidism, unspecified: Secondary | ICD-10-CM | POA: Diagnosis not present

## 2023-03-04 ENCOUNTER — Encounter: Payer: Self-pay | Admitting: Internal Medicine

## 2023-04-02 ENCOUNTER — Emergency Department (HOSPITAL_BASED_OUTPATIENT_CLINIC_OR_DEPARTMENT_OTHER)
Admission: EM | Admit: 2023-04-02 | Discharge: 2023-04-02 | Disposition: A | Discharge status: Home / Self Care | Payer: Medicare PPO | Attending: Emergency Medicine | Admitting: Emergency Medicine

## 2023-04-02 ENCOUNTER — Telehealth: Payer: Self-pay | Admitting: Family Medicine

## 2023-04-02 ENCOUNTER — Encounter (HOSPITAL_BASED_OUTPATIENT_CLINIC_OR_DEPARTMENT_OTHER): Payer: Self-pay

## 2023-04-02 ENCOUNTER — Other Ambulatory Visit: Payer: Self-pay

## 2023-04-02 DIAGNOSIS — Z79899 Other long term (current) drug therapy: Secondary | ICD-10-CM | POA: Diagnosis not present

## 2023-04-02 DIAGNOSIS — R000 Tachycardia, unspecified: Secondary | ICD-10-CM | POA: Diagnosis not present

## 2023-04-02 DIAGNOSIS — Z85828 Personal history of other malignant neoplasm of skin: Secondary | ICD-10-CM | POA: Insufficient documentation

## 2023-04-02 DIAGNOSIS — K529 Noninfective gastroenteritis and colitis, unspecified: Secondary | ICD-10-CM | POA: Diagnosis not present

## 2023-04-02 DIAGNOSIS — Z7901 Long term (current) use of anticoagulants: Secondary | ICD-10-CM | POA: Diagnosis not present

## 2023-04-02 DIAGNOSIS — E039 Hypothyroidism, unspecified: Secondary | ICD-10-CM | POA: Insufficient documentation

## 2023-04-02 DIAGNOSIS — I2510 Atherosclerotic heart disease of native coronary artery without angina pectoris: Secondary | ICD-10-CM | POA: Diagnosis not present

## 2023-04-02 DIAGNOSIS — I10 Essential (primary) hypertension: Secondary | ICD-10-CM | POA: Insufficient documentation

## 2023-04-02 DIAGNOSIS — R112 Nausea with vomiting, unspecified: Secondary | ICD-10-CM | POA: Diagnosis present

## 2023-04-02 DIAGNOSIS — Z87891 Personal history of nicotine dependence: Secondary | ICD-10-CM | POA: Insufficient documentation

## 2023-04-02 LAB — COMPREHENSIVE METABOLIC PANEL
ALT: 35 U/L (ref 0–44)
AST: 32 U/L (ref 15–41)
Albumin: 3.9 g/dL (ref 3.5–5.0)
Alkaline Phosphatase: 55 U/L (ref 38–126)
Anion gap: 10 (ref 5–15)
BUN: 19 mg/dL (ref 8–23)
CO2: 25 mmol/L (ref 22–32)
Calcium: 8.2 mg/dL — ABNORMAL LOW (ref 8.9–10.3)
Chloride: 101 mmol/L (ref 98–111)
Creatinine, Ser: 0.84 mg/dL (ref 0.44–1.00)
GFR, Estimated: >60 mL/min (ref >60)
Glucose, Bld: 168 mg/dL — ABNORMAL HIGH (ref 70–99)
Potassium: 4 mmol/L (ref 3.5–5.1)
Sodium: 136 mmol/L (ref 135–145)
Total Bilirubin: 0.9 mg/dL (ref 0.3–1.2)
Total Protein: 7.1 g/dL (ref 6.5–8.1)

## 2023-04-02 LAB — URINALYSIS, ROUTINE W REFLEX MICROSCOPIC
Bilirubin Urine: NEGATIVE
Glucose, UA: NEGATIVE mg/dL
Ketones, ur: NEGATIVE mg/dL
Leukocytes,Ua: NEGATIVE
Nitrite: NEGATIVE
Protein, ur: NEGATIVE mg/dL
Specific Gravity, Urine: >=1.03 (ref 1.005–1.030)
pH: 5.5 (ref 5.0–8.0)

## 2023-04-02 LAB — CBC
HCT: 44 % (ref 36.0–46.0)
Hemoglobin: 14.7 g/dL (ref 12.0–15.0)
MCH: 30.3 pg (ref 26.0–34.0)
MCHC: 33.4 g/dL (ref 30.0–36.0)
MCV: 90.7 fL (ref 80.0–100.0)
Platelets: 248 10*3/uL (ref 150–400)
RBC: 4.85 MIL/uL (ref 3.87–5.11)
RDW: 13.2 % (ref 11.5–15.5)
WBC: 7.5 10*3/uL (ref 4.0–10.5)
nRBC: 0 % (ref 0.0–0.2)

## 2023-04-02 LAB — URINALYSIS, MICROSCOPIC (REFLEX)

## 2023-04-02 LAB — LIPASE, BLOOD: Lipase: 20 U/L (ref 11–51)

## 2023-04-02 MED ORDER — ONDANSETRON HCL 4 MG/2ML IJ SOLN
4.0000 mg | Freq: Once | INTRAMUSCULAR | Status: AC
  Administered 2023-04-02: 4 mg via INTRAVENOUS
  Filled 2023-04-02: qty 2

## 2023-04-02 MED ORDER — ONDANSETRON HCL 4 MG/2ML IJ SOLN
4.0000 mg | Freq: Once | INTRAMUSCULAR | Status: AC | PRN
  Administered 2023-04-02: 4 mg via INTRAVENOUS
  Filled 2023-04-02: qty 2

## 2023-04-02 MED ORDER — HYDROMORPHONE HCL 1 MG/ML IJ SOLN
0.5000 mg | Freq: Once | INTRAMUSCULAR | Status: AC
  Administered 2023-04-02: 0.5 mg via INTRAVENOUS
  Filled 2023-04-02: qty 1

## 2023-04-02 MED ORDER — LOPERAMIDE HCL 2 MG PO CAPS: 2.0000 mg | ORAL_CAPSULE | Freq: Four times a day (QID) | ORAL | 0 refills | Status: DC | PRN

## 2023-04-02 MED ORDER — METOCLOPRAMIDE HCL 5 MG/ML IJ SOLN
10.0000 mg | Freq: Once | INTRAMUSCULAR | Status: AC
  Administered 2023-04-02: 10 mg via INTRAVENOUS
  Filled 2023-04-02: qty 2

## 2023-04-02 MED ORDER — SODIUM CHLORIDE 0.9 % IV BOLUS
1000.0000 mL | Freq: Once | INTRAVENOUS | Status: AC
  Administered 2023-04-02: 1000 mL via INTRAVENOUS

## 2023-04-02 MED ORDER — ONDANSETRON 4 MG PO TBDP: 4.0000 mg | ORAL_TABLET | Freq: Three times a day (TID) | ORAL | 0 refills | Status: DC | PRN

## 2023-04-02 NOTE — Telephone Encounter (Signed)
Returned patient's call. Stated she was having nausea and vomiting along with the "extreme diarrhea". She took her vital signs at home.  B/P 134/74 HR 108 T 97.1  The office didn't have any openings. Instructed patient to be seen in the ER if symptoms continue.

## 2023-04-02 NOTE — ED Triage Notes (Signed)
The patient has been V/D since 3am. She is also having ABD pain.

## 2023-04-02 NOTE — ED Notes (Signed)
Pt given ginger ale and snack. Ok per MD.

## 2023-04-02 NOTE — ED Provider Notes (Signed)
[place] [facility] AT [facility] Provider Note  CSN: [id number] Arrival date & time: 04/02/23 1812  Chief Complaint(s) Emesis and Diarrhea  HPI Destiny [name] is a 72 y.o. female history of hyperlipidemia, hypertension presenting with nausea, vomiting, diarrhea.  She reports nausea and vomiting since early this morning.  She reports associated diarrhea.  No bloody stools.  She reports some generalized abdominal cramping which improves after having vomiting or diarrhea.  No sick contacts.  No fevers or chills.  No recent travel.  No recent antibiotic use.  No urinary symptoms.  No chest pain or shortness of breath.   Past Medical History Past Medical History:  Diagnosis Date   Abnormal blood chemistry    Arthropathy    Back pain    Breast cyst    Cellulitis    Chest discomfort    Chronic bronchitis (HCC)    Constipation    Disturbance of skin sensation    Edema    Encounter for screening and preventative care 06/17/2016   Fatty liver    GERD (gastroesophageal reflux disease)    Hiatal hernia with gastroesophageal reflux 06/17/2016   High risk medication use    Hoarseness 06/15/2016   Hyperglycemia    Hyperglycemia 12/21/2016   Hyperlipidemia    Hyperlipidemia, mixed 06/15/2016   Hypertension    Hypopotassemia    Hypothyroidism    Joint pain    Menopause    Obesity    Palpitations    Positive ANA (antinuclear antibody)    Pre-diabetes    Shoulder pain, right    Skin cancer    Leg   Sleep apnea    denies   SOB (shortness of breath)    Thyroid disease 12/11/2014   Thyroid nodule    Urinary incontinence 04/01/2017   Vitamin D deficiency    Vitamin D deficiency    Patient Active Problem List   Diagnosis Date Noted   Urine frequency 01/14/2023   Muscle cramp 04/21/2022   Hair loss 04/21/2022   Osteopenia 04/21/2022   Fatty liver disease, nonalcoholic 04/15/2021   Family history of premature CAD 05/13/2018   CAD (coronary artery disease)  05/13/2018   Endometrium, hyperplasia 04/13/2018   Vitamin B12 deficiency 04/12/2018   Arthritis 04/12/2018   Constipation 02/10/2018   Abdominal pain 02/10/2018   Atypical chest pain 02/10/2018   Dyspepsia 02/10/2018   Mixed incontinence urge and stress 04/01/2017   Morbid obesity (HCC) 01/25/2017   Mixed hyperlipidemia 01/07/2017   Vitamin D deficiency 01/07/2017   Hyperglycemia 12/21/2016   Plantar fasciitis 09/02/2016   Preventative health care 06/17/2016   Hiatal hernia with gastroesophageal reflux 06/17/2016   Hoarseness 06/15/2016   DDD (degenerative disc disease), cervical 05/04/2016   Foraminal stenosis of cervical region 05/04/2016   Neck pain 05/04/2016   Numbness and tingling in both hands 05/04/2016   Essential hypertension 03/26/2016   Hypothyroidism 12/11/2014   Home Medication(s) Prior to Admission medications   Medication Sig Start Date End Date Taking? Authorizing Provider  loperamide (IMODIUM) 2 MG capsule Take 1 capsule (2 mg total) by mouth 4 (four) times daily as needed for diarrhea or loose stools. 04/02/23  Yes Lonell Grandchild, MD  ondansetron (ZOFRAN-ODT) 4 MG disintegrating tablet Take 1 tablet (4 mg total) by mouth every 8 (eight) hours as needed for nausea or vomiting. 04/02/23  Yes Lonell Grandchild, MD  albuterol (VENTOLIN HFA) 108 (90 Base) MCG/ACT inhaler Inhale 2 puffs into the lungs every 6 (six)  hours as needed for wheezing or shortness of breath. Patient not taking: Reported on 01/14/2023 11/01/18   Bradd Canary, MD  apixaban (ELIQUIS) 5 MG TABS tablet Take 1 tablet (5 mg total) by mouth 2 (two) times daily. Patient not taking: Reported on 01/14/2023 11/27/22   Chrystie Nose, MD  apixaban (ELIQUIS) 5 MG TABS tablet Take 1 tablet (5 mg total) by mouth 2 (two) times daily. 11/27/22   Hilty, Lisette Abu, MD  benzonatate (TESSALON) 100 MG capsule Take 1 capsule (100 mg total) by mouth 2 (two) times daily as needed for cough. 09/09/20   Bradd Canary, MD  docusate sodium (DOK) 100 MG capsule Take 1 capsule (100 mg total) by mouth 2 (two) times daily as needed for mild constipation. 04/15/21   Bradd Canary, MD  furosemide (LASIX) 20 MG tablet Take 1 tablet (20 mg total) by mouth daily as needed for fluid or edema. prn weight gain>3#/24 hours, SOB 12/01/22   Bradd Canary, MD  irbesartan (AVAPRO) 150 MG tablet Take 1 tablet (150 mg total) by mouth daily. 01/19/23   Bradd Canary, MD  metoprolol succinate (TOPROL-XL) 25 MG 24 hr tablet Take 1 tablet (25 mg total) by mouth daily. 01/19/23   Bradd Canary, MD  nitroGLYCERIN (NITROSTAT) 0.4 MG SL tablet Place 1 tablet (0.4 mg total) under the tongue every 5 (five) minutes as needed for chest pain. 01/24/21   Hilty, Lisette Abu, MD  REPATHA SURECLICK 140 MG/ML SOAJ INJECT 1 PEN (140MG ) SUBCUTANEOUSLY EVERY 14 DAYS 04/13/22   Hilty, Lisette Abu, MD  thyroid (ARMOUR) 120 MG tablet Take 120 mg by mouth daily before breakfast.    [provider]  Vibegron (GEMTESA) 75 MG TABS Take 1 tablet (75 mg total) by mouth daily. 12/23/22   Stoneking, Danford Bad., MD  Vitamin D, Ergocalciferol, (DRISDOL) 1.25 MG (50000 UNIT) CAPS capsule TAKE ONE CAPSULE BY MOUTH EVERY SEVEN DAYS 01/19/23   Bradd Canary, MD                                                                                                                                    Past Surgical History Past Surgical History:  Procedure Laterality Date   BREAST CYST ASPIRATION Left    30 years ago  1970's or 1980's   CORONARY PRESSURE/FFR STUDY N/A 04/25/2018   Procedure: INTRAVASCULAR PRESSURE WIRE/FFR STUDY;  Surgeon: Corky Crafts, MD;  Location: [facility] INVASIVE CV LAB;  Service: Cardiovascular;  Laterality: N/A;   LEFT HEART CATH AND CORONARY ANGIOGRAPHY N/A 04/25/2018   Procedure: LEFT HEART CATH AND CORONARY ANGIOGRAPHY;  Surgeon: Corky Crafts, MD;  Location: [facility] INVASIVE CV LAB;  Service: Cardiovascular;  Laterality: N/A;    LIPOSUCTION  1990   over gluteal area    VEIN SURGERY Left 2022   WISDOM TOOTH EXTRACTION     and all upper teeth  Family History Family History  Problem Relation Age of Onset   Irregular heart beat Mother    Heart disease Mother    Rheumatic fever Mother    Hypertension Father    Ulcers Father    Hyperlipidemia Father    Stroke Maternal Grandmother    Diabetes Maternal Grandmother    Hypertension Paternal Grandmother    Ulcers Paternal Grandfather     Social History Social History   Tobacco Use   Smoking status: Former    Current packs/day: 0.00    Average packs/day: 1 pack/day for 27.0 years (27.0 ttl pk-yrs)    Types: Cigarettes    Start date: 18    Quit date: 2011    Years since quitting: 13.8   Smokeless tobacco: Never  Vaping Use   Vaping status: Never Used  Substance Use Topics   Alcohol use: Yes    Alcohol/week: 0.0 standard drinks of alcohol   Drug use: No   Allergies Crestor [rosuvastatin calcium], Diclofenac, Hydrochlorothiazide, and Statins  Review of Systems Review of Systems  All other systems reviewed and are negative.   Physical Exam Vital Signs  I have reviewed the triage vital signs BP 119/61   Pulse 89   Temp 98.2 F (36.8 C) (Oral)   Resp (!) 21   Ht 5\' 3"  (1.6 m)   Wt 101 kg   SpO2 95%   BMI 39.44 kg/m  Physical Exam Vitals and nursing note reviewed.  Constitutional:      General: She is not in acute distress.    Appearance: She is well-developed.  HENT:     Head: Normocephalic and atraumatic.     Mouth/Throat:     Mouth: Mucous membranes are dry.  Eyes:     Pupils: Pupils are equal, round, and reactive to light.  Cardiovascular:     Rate and Rhythm: Normal rate and regular rhythm.     Heart sounds: No murmur heard. Pulmonary:     Effort: Pulmonary effort is normal. No respiratory distress.     Breath sounds: Normal breath sounds.  Abdominal:     General: Abdomen is flat.     Palpations: Abdomen is soft.      Tenderness: There is no abdominal tenderness.  Musculoskeletal:        General: No tenderness.     Right lower leg: No edema.     Left lower leg: No edema.  Skin:    General: Skin is warm and dry.  Neurological:     General: No focal deficit present.     Mental Status: She is alert. Mental status is at baseline.  Psychiatric:        Mood and Affect: Mood normal.        Behavior: Behavior normal.     ED Results and Treatments Labs (all labs ordered are listed, but only abnormal results are displayed) Labs Reviewed  COMPREHENSIVE METABOLIC PANEL - Abnormal; Notable for the following components:      Result Value   Glucose, Bld 168 (*)    Calcium 8.2 (*)    All other components within normal limits  URINALYSIS, ROUTINE W REFLEX MICROSCOPIC - Abnormal; Notable for the following components:   Hgb urine dipstick TRACE (*)    All other components within normal limits  URINALYSIS, MICROSCOPIC (REFLEX) - Abnormal; Notable for the following components:   Bacteria, UA FEW (*)    All other components within normal limits  LIPASE, BLOOD  CBC  Radiology No results found.  Pertinent labs & imaging results that were available during my care of the patient were reviewed by me and considered in my medical decision making (see MDM for details).  Medications Ordered in ED Medications  ondansetron (ZOFRAN) injection 4 mg (has no administration in time range)  ondansetron (ZOFRAN) injection 4 mg (4 mg Intravenous Given 04/02/23 1855)  sodium chloride 0.9 % bolus 1,000 mL (0 mLs Intravenous Stopped 04/02/23 2209)  metoCLOPramide (REGLAN) injection 10 mg (10 mg Intravenous Given 04/02/23 1954)  HYDROmorphone (DILAUDID) injection 0.5 mg (0.5 mg Intravenous Given 04/02/23 1954)                                                                                                                                      Procedures Procedures  (including critical care time)  Medical Decision Making / ED Course   MDM:  72 year old female presenting to the emergency department with nausea, vomiting, diarrhea.  Patient overall very well-appearing, does appear mildly dehydrated.  Suspect likely gastroenteritis.  She reports nausea, vomiting and diarrhea.  She has no bloody stools to suggest any invasive diarrhea or ischemic colitis.  She has no recent antibiotic use to suggest C. difficile.  She has no focal abdominal tenderness to suggest acute intra-abdominal process such as perforation, abscess, diverticulitis, volvulus, obstruction.  She received IV fluids, Zofran and pain control and feels much better.  Will p.o. trial.  Patient able to tolerate p.o., anticipate discharge.  Clinical Course as of 04/02/23 2246  Fri Apr 02, 2023  2235 BP(!): 108/45 Was an inaccurate measurement.  Patient had taken off her blood pressure cuff and put it back on herself.  It is not very well attached.  I recycled her blood pressure with the cuff properly applied and it is 119/61.  Patient feels much better after receiving fluids and antibiotics.  Labs reassuring.  No significant dehydration.  She is able to tolerate p.o.  She request discharge. Will discharge patient to home. All questions answered. Patient comfortable with plan of discharge. Return precautions discussed with patient and specified on the after visit summary.  [WS]    Clinical Course User Index [WS] Lonell Grandchild, MD     Additional history obtained: -Additional history obtained from spouse    Lab Tests: -I ordered, reviewed, and interpreted labs.   The pertinent results include:   Labs Reviewed  COMPREHENSIVE METABOLIC PANEL - Abnormal; Notable for the following components:      Result Value   Glucose, Bld 168 (*)    Calcium 8.2 (*)    All other components within normal limits  URINALYSIS, ROUTINE W REFLEX  MICROSCOPIC - Abnormal; Notable for the following components:   Hgb urine dipstick TRACE (*)    All other components within normal limits  URINALYSIS, MICROSCOPIC (REFLEX) - Abnormal; Notable for the following components:   Bacteria, UA FEW (*)  All other components within normal limits  LIPASE, BLOOD  CBC    Notable for no AKI or leukocytosis   EKG   EKG Interpretation Date/Time:  Friday April 02 2023 18:35:22 EDT Ventricular Rate:  109 PR Interval:  154 QRS Duration:  99 QT Interval:  331 QTC Calculation: 446 R Axis:   259  Text Interpretation: Sinus tachycardia Anterolateral infarct, age indeterminate Compared to previous tracing rate has increased Confirmed by Alvino Blood ([id number]) on 04/02/2023 7:02:49 PM         Medicines ordered and prescription drug management: Meds ordered this encounter  Medications   ondansetron (ZOFRAN) injection 4 mg   sodium chloride 0.9 % bolus 1,000 mL   metoCLOPramide (REGLAN) injection 10 mg   HYDROmorphone (DILAUDID) injection 0.5 mg   ondansetron (ZOFRAN-ODT) 4 MG disintegrating tablet    Sig: Take 1 tablet (4 mg total) by mouth every 8 (eight) hours as needed for nausea or vomiting.    Dispense:  12 tablet    Refill:  0   loperamide (IMODIUM) 2 MG capsule    Sig: Take 1 capsule (2 mg total) by mouth 4 (four) times daily as needed for diarrhea or loose stools.    Dispense:  12 capsule    Refill:  0   ondansetron (ZOFRAN) injection 4 mg    -I have reviewed the patients home medicines and have made adjustments as needed   Cardiac Monitoring: The patient was maintained on a cardiac monitor.  I personally viewed and interpreted the cardiac monitored which showed an underlying rhythm of: NSR  Social Determinants of Health:  Diagnosis or treatment significantly limited by social determinants of health: obesity   Reevaluation: After the interventions noted above, I reevaluated the patient and found that their symptoms have  improved  Co morbidities that complicate the patient evaluation  Past Medical History:  Diagnosis Date   Abnormal blood chemistry    Arthropathy    Back pain    Breast cyst    Cellulitis    Chest discomfort    Chronic bronchitis (HCC)    Constipation    Disturbance of skin sensation    Edema    Encounter for screening and preventative care 06/17/2016   Fatty liver    GERD (gastroesophageal reflux disease)    Hiatal hernia with gastroesophageal reflux 06/17/2016   High risk medication use    Hoarseness 06/15/2016   Hyperglycemia    Hyperglycemia 12/21/2016   Hyperlipidemia    Hyperlipidemia, mixed 06/15/2016   Hypertension    Hypopotassemia    Hypothyroidism    Joint pain    Menopause    Obesity    Palpitations    Positive ANA (antinuclear antibody)    Pre-diabetes    Shoulder pain, right    Skin cancer    Leg   Sleep apnea    denies   SOB (shortness of breath)    Thyroid disease 12/11/2014   Thyroid nodule    Urinary incontinence 04/01/2017   Vitamin D deficiency    Vitamin D deficiency       Dispostion: Disposition decision including need for hospitalization was considered, and patient discharged from emergency department.    Final Clinical Impression(s) / ED Diagnoses Final diagnoses:  Gastroenteritis     This chart was dictated using voice recognition software.  Despite Space efforts to proofread,  errors can occur which can change the documentation meaning.    Lonell Grandchild, MD 04/02/23 [phone]

## 2023-04-02 NOTE — Discharge Instructions (Addendum)
We evaluated you for your nausea, vomiting, and diarrhea.  Your symptoms are likely due to a viral illness.  Your physical examination and your laboratory testing was reassuring.  Your symptoms improved in the emergency department with fluids and medication.  Please be sure to drink lots of fluids.  I have prescribed you a medication called Zofran which you can take every 8 hours as needed for nausea.  It is a dissolvable medicine so you should be able to take it even if you feel very nauseous.  I have also prescribed you medication called Imodium.  You can take this every 4 hours as needed for diarrhea.  Please follow-up closely with your primary doctor.  If you have any new or worsening symptoms such as severe abdominal pain, bloody stools, uncontrolled vomiting, dehydration, lightheadedness or dizziness, fainting, chest pain, or any other new symptoms, please return to the emergency department.

## 2023-04-02 NOTE — Telephone Encounter (Signed)
Pt called and stated that she has been having "extreme diarrhea" since 3 am this morning. I offered to schedule her an OV with one of our providers but patient declined and asked for a nurse to call her cell number on file. Please call and advise.

## 2023-04-06 ENCOUNTER — Encounter: Payer: Self-pay | Admitting: Family Medicine

## 2023-04-07 DIAGNOSIS — L281 Prurigo nodularis: Secondary | ICD-10-CM | POA: Diagnosis not present

## 2023-04-15 DIAGNOSIS — H2513 Age-related nuclear cataract, bilateral: Secondary | ICD-10-CM | POA: Diagnosis not present

## 2023-04-15 DIAGNOSIS — E119 Type 2 diabetes mellitus without complications: Secondary | ICD-10-CM | POA: Diagnosis not present

## 2023-04-15 DIAGNOSIS — H43392 Other vitreous opacities, left eye: Secondary | ICD-10-CM | POA: Diagnosis not present

## 2023-04-20 ENCOUNTER — Telehealth: Payer: Self-pay

## 2023-04-20 NOTE — Telephone Encounter (Signed)
**Note De-Identified Destiny [name] Obfuscation** Per [employer] provider portal: CPAP Titration Approved Authorization #[id number]  Tracking #[id number] Dates of service 05/04/2023 - 07/03/2023

## 2023-04-21 ENCOUNTER — Encounter: Payer: Self-pay | Admitting: Internal Medicine

## 2023-04-21 MED ORDER — REPATHA SURECLICK 140 MG/ML ~~LOC~~ SOAJ: 140.0000 mg | SUBCUTANEOUS | 2 refills | Status: DC

## 2023-04-21 NOTE — Assessment & Plan Note (Signed)
Encourage heart healthy diet such as MIND or DASH diet, increase exercise, avoid trans fats, simple carbohydrates and processed foods, consider a krill or fish or flaxseed oil cap daily.  Tolerating Repatha 

## 2023-04-21 NOTE — Assessment & Plan Note (Signed)
Well controlled, no changes to meds. Encouraged heart healthy diet such as the DASH diet and exercise as tolerated.  °

## 2023-04-21 NOTE — Assessment & Plan Note (Signed)
hgba1c acceptable, minimize simple carbs. Increase exercise as tolerated.  

## 2023-04-21 NOTE — Assessment & Plan Note (Signed)
Supplement and monitor 

## 2023-04-21 NOTE — Assessment & Plan Note (Signed)
Encouraged DASH or MIND diet, decrease po intake and increase exercise as tolerated. Needs 7-8 hours of sleep nightly. Avoid trans fats, eat small, frequent meals every 4-5 hours with lean proteins, complex carbs and healthy fats. Minimize simple carbs, high fat foods and processed foods 

## 2023-04-21 NOTE — Assessment & Plan Note (Signed)
On Armour Thyroid, continue to monitor  

## 2023-04-21 NOTE — Assessment & Plan Note (Signed)
Hydrate and monitor 

## 2023-04-22 ENCOUNTER — Ambulatory Visit (INDEPENDENT_AMBULATORY_CARE_PROVIDER_SITE_OTHER): Payer: Medicare PPO | Admitting: Family Medicine

## 2023-04-22 ENCOUNTER — Encounter: Payer: Self-pay | Admitting: Family Medicine

## 2023-04-22 VITALS — BP 154/78 | HR 65 | Temp 98.1°F | Ht 63.0 in | Wt 232.4 lb

## 2023-04-22 DIAGNOSIS — E782 Mixed hyperlipidemia: Secondary | ICD-10-CM

## 2023-04-22 DIAGNOSIS — Z23 Encounter for immunization: Secondary | ICD-10-CM | POA: Diagnosis not present

## 2023-04-22 DIAGNOSIS — R252 Cramp and spasm: Secondary | ICD-10-CM

## 2023-04-22 DIAGNOSIS — I10 Essential (primary) hypertension: Secondary | ICD-10-CM | POA: Diagnosis not present

## 2023-04-22 DIAGNOSIS — E538 Deficiency of other specified B group vitamins: Secondary | ICD-10-CM | POA: Diagnosis not present

## 2023-04-22 DIAGNOSIS — R739 Hyperglycemia, unspecified: Secondary | ICD-10-CM

## 2023-04-22 DIAGNOSIS — E039 Hypothyroidism, unspecified: Secondary | ICD-10-CM | POA: Diagnosis not present

## 2023-04-22 DIAGNOSIS — E559 Vitamin D deficiency, unspecified: Secondary | ICD-10-CM

## 2023-04-22 DIAGNOSIS — R011 Cardiac murmur, unspecified: Secondary | ICD-10-CM

## 2023-04-22 DIAGNOSIS — Z1231 Encounter for screening mammogram for malignant neoplasm of breast: Secondary | ICD-10-CM

## 2023-04-22 MED ORDER — WEGOVY 0.25 MG/0.5ML ~~LOC~~ SOAJ: 0.2500 mg | SUBCUTANEOUS | 1 refills | Status: DC

## 2023-04-22 NOTE — Progress Notes (Signed)
Subjective:    Patient ID: Destiny [name], female    DOB: [birth year], 72 y.o.   MRN: [id number]  Chief Complaint  Patient presents with  . Follow-up    HPI Discussed the use of AI scribe software for clinical note transcription with the patient, who gave verbal consent to proceed.  History of Present Illness   The patient, with a history of smoking, fatty liver, and recent diagnosis of sleep apnea, presents with multiple concerns. The primary concern is persistent fatigue and lack of energy, which has been worsening over time. The patient reports feeling lightheaded during a recent meal and has been experiencing increasing tiredness and shortness of breath. The patient also reports difficulty losing weight despite dietary changes and expresses frustration with the lack of progress.  Additionally, the patient has been experiencing knee pain, which has been increasing in intensity recently. The patient attributes this to a previous fall on ice and notes that the pain and weakness have been particularly noticeable this fall.  The patient also mentions a recent diagnosis of sleep apnea and is scheduled for a follow-up with a cardiologist due to a history of atrial fibrillation. The patient is interested in receiving a flu shot and is considering a new medication for weight loss.        Past Medical History:  Diagnosis Date  . Abnormal blood chemistry   . Arthropathy   . Back pain   . Breast cyst   . Cellulitis   . Chest discomfort   . Chronic bronchitis (HCC)   . Constipation   . Disturbance of skin sensation   . Edema   . Encounter for screening and preventative care 06/17/2016  . Fatty liver   . GERD (gastroesophageal reflux disease)   . Hiatal hernia with gastroesophageal reflux 06/17/2016  . High risk medication use   . Hoarseness 06/15/2016  . Hyperglycemia   . Hyperglycemia 12/21/2016  . Hyperlipidemia   . Hyperlipidemia, mixed 06/15/2016  . Hypertension   . Hypopotassemia    . Hypothyroidism   . Joint pain   . Menopause   . Obesity   . Palpitations   . Positive ANA (antinuclear antibody)   . Pre-diabetes   . Shoulder pain, right   . Skin cancer    Leg  . Sleep apnea    denies  . SOB (shortness of breath)   . Thyroid disease 12/11/2014  . Thyroid nodule   . Urinary incontinence 04/01/2017  . Vitamin D deficiency   . Vitamin D deficiency     Past Surgical History:  Procedure Laterality Date  . BREAST CYST ASPIRATION Left    30 years ago  1970's or 1980's  . CORONARY PRESSURE/FFR STUDY N/A 04/25/2018   Procedure: INTRAVASCULAR PRESSURE WIRE/FFR STUDY;  Surgeon: Corky Crafts, MD;  Location: [facility] INVASIVE CV LAB;  Service: Cardiovascular;  Laterality: N/A;  . LEFT HEART CATH AND CORONARY ANGIOGRAPHY N/A 04/25/2018   Procedure: LEFT HEART CATH AND CORONARY ANGIOGRAPHY;  Surgeon: Corky Crafts, MD;  Location: [facility] INVASIVE CV LAB;  Service: Cardiovascular;  Laterality: N/A;  . LIPOSUCTION  1990   over gluteal area   . VEIN SURGERY Left 2022  . WISDOM TOOTH EXTRACTION     and all upper teeth    Family History  Problem Relation Age of Onset  . Irregular heart beat Mother   . Heart disease Mother   . Rheumatic fever Mother   . Hypertension Father   . Ulcers Father   .  Hyperlipidemia Father   . Stroke Maternal Grandmother   . Diabetes Maternal Grandmother   . Hypertension Paternal Grandmother   . Ulcers Paternal Grandfather     Social History   Socioeconomic History  . Marital status: Married    Spouse name: Elesia Norrick  . Number of children: Not on file  . Years of education: Not on file  . Highest education level: Not on file  Occupational History  . Occupation: homemaker  Tobacco Use  . Smoking status: Former    Current packs/day: 0.00    Average packs/day: 1 pack/day for 27.0 years (27.0 ttl pk-yrs)    Types: Cigarettes    Start date: 86    Quit date: 2011    Years since quitting: 13.8  . Smokeless tobacco: Never   Vaping Use  . Vaping status: Never Used  Substance and Sexual Activity  . Alcohol use: Yes    Alcohol/week: 0.0 standard drinks of alcohol  . Drug use: No  . Sexual activity: Not Currently    Birth control/protection: Post-menopausal    Comment: menopause around age [age]  Other Topics Concern  . Not on file  Social History Narrative   Occasional alcohol use: hard liquor, wine and beer   Social Determinants of Health   Financial Resource Strain: Low Risk  (04/22/2022)   Overall Financial Resource Strain (CARDIA)   . Difficulty of Paying Living Expenses: Not hard at all  Food Insecurity: Low Risk  (02/18/2023)   Received from [facility]   Hunger Vital Sign   . Worried About Programme researcher, broadcasting/film/video in the Last Year: Never true   . Ran Out of Food in the Last Year: Never true  Transportation Needs: No Transportation Needs (02/18/2023)   Received from [facility]   . In the past 12 months, has lack of reliable transportation kept you from medical appointments, meetings, work or from getting things needed for daily living? : No  Physical Activity: Sufficiently Active (04/22/2022)   Exercise Vital Sign   . Days of Exercise per Week: 3 days   . Minutes of Exercise per Session: 90 min  Stress: No Stress Concern Present (04/22/2022)   [employer] of Occupational Health - Occupational Stress Questionnaire   . Feeling of Stress : Not at all  Social Connections: Socially Integrated (04/22/2022)   Social Connection and Isolation Panel [NHANES]   . Frequency of Communication with Friends and Family: More than three times a week   . Frequency of Social Gatherings with Friends and Family: More than three times a week   . Attends Religious Services: More than 4 times per year   . Active Member of Clubs or Organizations: Yes   . Attends Banker Meetings: More than 4 times per year   . Marital Status: Married  Catering manager Violence: Not At Risk  (04/22/2022)   Humiliation, Afraid, Rape, and Kick questionnaire   . Fear of Current or Ex-Partner: No   . Emotionally Abused: No   . Physically Abused: No   . Sexually Abused: No    Outpatient Medications Prior to Visit  Medication Sig Dispense Refill  . albuterol (VENTOLIN HFA) 108 (90 Base) MCG/ACT inhaler Inhale 2 puffs into the lungs every 6 (six) hours as needed for wheezing or shortness of breath. 18 g 5  . apixaban (ELIQUIS) 5 MG TABS tablet Take 1 tablet (5 mg total) by mouth 2 (two) times daily. 180 tablet 3  . benzonatate (  TESSALON) 100 MG capsule Take 1 capsule (100 mg total) by mouth 2 (two) times daily as needed for cough. 20 capsule 1  . docusate sodium (DOK) 100 MG capsule Take 1 capsule (100 mg total) by mouth 2 (two) times daily as needed for mild constipation. 180 capsule 1  . Evolocumab (REPATHA SURECLICK) 140 MG/ML SOAJ Inject 140 mg into the skin every 14 (fourteen) days. 6 mL 2  . furosemide (LASIX) 20 MG tablet Take 1 tablet (20 mg total) by mouth daily as needed for fluid or edema. prn weight gain>3#/24 hours, SOB 90 tablet 1  . irbesartan (AVAPRO) 150 MG tablet Take 1 tablet (150 mg total) by mouth daily. 90 tablet 1  . loperamide (IMODIUM) 2 MG capsule Take 1 capsule (2 mg total) by mouth 4 (four) times daily as needed for diarrhea or loose stools. 12 capsule 0  . metoprolol succinate (TOPROL-XL) 25 MG 24 hr tablet Take 1 tablet (25 mg total) by mouth daily. 90 tablet 1  . nitroGLYCERIN (NITROSTAT) 0.4 MG SL tablet Place 1 tablet (0.4 mg total) under the tongue every 5 (five) minutes as needed for chest pain. 25 tablet 3  . ondansetron (ZOFRAN-ODT) 4 MG disintegrating tablet Take 1 tablet (4 mg total) by mouth every 8 (eight) hours as needed for nausea or vomiting. 12 tablet 0  . thyroid (ARMOUR) 120 MG tablet Take 120 mg by mouth daily before breakfast.    . Vibegron (GEMTESA) 75 MG TABS Take 1 tablet (75 mg total) by mouth daily. 30 tablet 5  . Vitamin D,  Ergocalciferol, (DRISDOL) 1.25 MG (50000 UNIT) CAPS capsule TAKE ONE CAPSULE BY MOUTH EVERY SEVEN DAYS 12 capsule 3  . apixaban (ELIQUIS) 5 MG TABS tablet Take 1 tablet (5 mg total) by mouth 2 (two) times daily. 60 tablet 0   No facility-administered medications prior to visit.    Allergies  Allergen Reactions  . Crestor [Rosuvastatin Calcium] Other (See Comments)    myalgias  . Diclofenac Itching, Nausea Only and Other (See Comments)    Wheezing  . Hydrochlorothiazide     Wheezing. Losing teeth.  . Statins     Review of Systems  Constitutional:  Negative for fever and malaise/fatigue.  HENT:  Negative for congestion.   Eyes:  Negative for blurred vision.  Respiratory:  Negative for shortness of breath.   Cardiovascular:  Negative for chest pain, palpitations and leg swelling.  Gastrointestinal:  Negative for abdominal pain, blood in stool and nausea.  Genitourinary:  Negative for dysuria and frequency.  Musculoskeletal:  Negative for falls.  Skin:  Negative for rash.  Neurological:  Negative for dizziness, loss of consciousness and headaches.  Endo/Heme/Allergies:  Negative for environmental allergies.  Psychiatric/Behavioral:  Negative for depression. The patient is not nervous/anxious.       Objective:    Physical Exam  BP (!) 154/78 (BP Location: Right Arm, Patient Position: Sitting, Cuff Size: Large)   Pulse 65   Temp 98.1 F (36.7 C) (Oral)   Ht 5\' 3"  (1.6 m)   Wt 232 lb 6.4 oz (105.4 kg)   SpO2 98%   BMI 41.17 kg/m  Wt Readings from Last 3 Encounters:  04/22/23 232 lb 6.4 oz (105.4 kg)  04/02/23 222 lb 10.6 oz (101 kg)  01/14/23 223 lb (101.2 kg)    Diabetic Foot Exam - Simple   No data filed    Lab Results  Component Value Date   WBC 7.5 04/02/2023   HGB 14.7  04/02/2023   HCT 44.0 04/02/2023   PLT 248 04/02/2023   GLUCOSE 168 (H) 04/02/2023   CHOL 130 05/04/2022   TRIG 84 05/04/2022   HDL 54 05/04/2022   LDLDIRECT 71.0 04/18/2020   LDLCALC 60  05/04/2022   ALT 35 04/02/2023   AST 32 04/02/2023   NA 136 04/02/2023   K 4.0 04/02/2023   CL 101 04/02/2023   CREATININE 0.84 04/02/2023   BUN 19 04/02/2023   CO2 25 04/02/2023   TSH 6.18 (H) 10/06/2019   HGBA1C 5.8 04/22/2022    Lab Results  Component Value Date   TSH 6.18 (H) 10/06/2019   Lab Results  Component Value Date   WBC 7.5 04/02/2023   HGB 14.7 04/02/2023   HCT 44.0 04/02/2023   MCV 90.7 04/02/2023   PLT 248 04/02/2023   Lab Results  Component Value Date   NA 136 04/02/2023   K 4.0 04/02/2023   CO2 25 04/02/2023   GLUCOSE 168 (H) 04/02/2023   BUN 19 04/02/2023   CREATININE 0.84 04/02/2023   BILITOT 0.9 04/02/2023   ALKPHOS 55 04/02/2023   AST 32 04/02/2023   ALT 35 04/02/2023   PROT 7.1 04/02/2023   ALBUMIN 3.9 04/02/2023   CALCIUM 8.2 (L) 04/02/2023   ANIONGAP 10 04/02/2023   GFR 73.98 04/21/2022   Lab Results  Component Value Date   CHOL 130 05/04/2022   Lab Results  Component Value Date   HDL 54 05/04/2022   Lab Results  Component Value Date   LDLCALC 60 05/04/2022   Lab Results  Component Value Date   TRIG 84 05/04/2022   Lab Results  Component Value Date   CHOLHDL 2.4 05/04/2022   Lab Results  Component Value Date   HGBA1C 5.8 04/22/2022       Assessment & Plan:  Essential hypertension Assessment & Plan: Well controlled, no changes to meds. Encouraged heart healthy diet such as the DASH diet and exercise as tolerated.    Orders: -     Comprehensive metabolic panel -     CBC with Differential/Platelet  Hyperglycemia Assessment & Plan: hgba1c acceptable, minimize simple carbs. Increase exercise as tolerated.   Orders: -     Hemoglobin A1c -     Insulin, random  Hypothyroidism, unspecified type Assessment & Plan: On Armour Thyroid, continue to monitor   Orders: -     TSH  Mixed hyperlipidemia Assessment & Plan: Encourage heart healthy diet such as MIND or DASH diet, increase exercise, avoid trans fats,  simple carbohydrates and processed foods, consider a krill or fish or flaxseed oil cap daily.  Tolerating Repatha   Muscle cramp Assessment & Plan: Hydrate and monitor    Morbid obesity (HCC) Assessment & Plan: Encouraged DASH or MIND diet, decrease po intake and increase exercise as tolerated. Needs 7-8 hours of sleep nightly. Avoid trans fats, eat small, frequent meals every 4-5 hours with lean proteins, complex carbs and healthy fats. Minimize simple carbs, high fat foods and processed foods   Vitamin B12 deficiency Assessment & Plan: Supplement and monitor    Vitamin D deficiency Assessment & Plan: Supplement and monitor    Encounter for screening mammogram for malignant neoplasm of breast -     3D Screening Mammogram, Left and Right; Future  Heart murmur -     ECHOCARDIOGRAM COMPLETE; Future  Need for influenza vaccination -     Flu Vaccine Trivalent High Dose (Fluad)  Other orders -     [id number];  Inject 0.25 mg into the skin once a week.  Dispense: 2 mL; Refill: 1    Assessment and Plan    Fatigue and Lightheadedness Reports of fatigue, lightheadedness, and shortness of breath. Possible contributing factors include weight, age, stress, and potential cardiac issues. -Order labs to investigate fatigue. -Refer for echocardiogram to evaluate heart function and murmur.  Obesity Struggling with weight loss despite dietary changes. History of smoking and fatty liver. -Start [place], pending insurance approval. -Check tolerance and effectiveness in 3 months.  Knee Pain Reports of increasing knee pain, likely due to a previous fall and exacerbated by weight. -Encourage weight loss and continued use of supplements (CoQ10, fish oil). -Consider referral to orthopedics or sports medicine if pain worsens.  Atrial Fibrillation History of atrial fibrillation, currently asymptomatic. -Continue management with cardiologist.  Sleep Apnea Pending sleep study for suspected  sleep apnea. -Encourage follow-up with sleep study results.  General Health Maintenance -Order mammogram due to it being approximately 2 years since the last one. -Discuss lung cancer screening with pulmonology based on smoking history. -Schedule annual physical for April 2024.         Danise Edge, MD

## 2023-04-22 NOTE — Patient Instructions (Signed)
Hypertension, Adult High blood pressure (hypertension) is when the force of blood pumping through the arteries is too strong. The arteries are the blood vessels that carry blood from the heart throughout the body. Hypertension forces the heart to work harder to pump blood and may cause arteries to become narrow or stiff. Untreated or uncontrolled hypertension can lead to a heart attack, heart failure, a stroke, kidney disease, and other problems. A blood pressure reading consists of a higher number over a lower number. Ideally, your blood pressure should be below 120/80. The first ("top") number is called the systolic pressure. It is a measure of the pressure in your arteries as your heart beats. The second ("bottom") number is called the diastolic pressure. It is a measure of the pressure in your arteries as the heart relaxes. What are the causes? The exact cause of this condition is not known. There are some conditions that result in high blood pressure. What increases the risk? Certain factors may make you more likely to develop high blood pressure. Some of these risk factors are under your control, including: Smoking. Not getting enough exercise or physical activity. Being overweight. Having too much fat, sugar, calories, or salt (sodium) in your diet. Drinking too much alcohol. Other risk factors include: Having a personal history of heart disease, diabetes, high cholesterol, or kidney disease. Stress. Having a family history of high blood pressure and high cholesterol. Having obstructive sleep apnea. Age. The risk increases with age. What are the signs or symptoms? High blood pressure may not cause symptoms. Very high blood pressure (hypertensive crisis) may cause: Headache. Fast or irregular heartbeats (palpitations). Shortness of breath. Nosebleed. Nausea and vomiting. Vision changes. Severe chest pain, dizziness, and seizures. How is this diagnosed? This condition is diagnosed by  measuring your blood pressure while you are seated, with your arm resting on a flat surface, your legs uncrossed, and your feet flat on the floor. The cuff of the blood pressure monitor will be placed directly against the skin of your upper arm at the level of your heart. Blood pressure should be measured at least twice using the same arm. Certain conditions can cause a difference in blood pressure between your right and left arms. If you have a high blood pressure reading during one visit or you have normal blood pressure with other risk factors, you may be asked to: Return on a different day to have your blood pressure checked again. Monitor your blood pressure at home for 1 week or longer. If you are diagnosed with hypertension, you may have other blood or imaging tests to help your health care provider understand your overall risk for other conditions. How is this treated? This condition is treated by making healthy lifestyle changes, such as eating healthy foods, exercising more, and reducing your alcohol intake. You may be referred for counseling on a healthy diet and physical activity. Your health care provider may prescribe medicine if lifestyle changes are not enough to get your blood pressure under control and if: Your systolic blood pressure is above 130. Your diastolic blood pressure is above 80. Your personal target blood pressure may vary depending on your medical conditions, your age, and other factors. Follow these instructions at home: Eating and drinking  Eat a diet that is high in fiber and potassium, and low in sodium, added sugar, and fat. An example of this eating plan is called the DASH diet. DASH stands for Dietary Approaches to Stop Hypertension. To eat this way: Eat   plenty of fresh fruits and vegetables. Try to fill one half of your plate at each meal with fruits and vegetables. Eat whole grains, such as whole-wheat pasta, brown rice, or whole-grain bread. Fill about one  fourth of your plate with whole grains. Eat or drink low-fat dairy products, such as skim milk or low-fat yogurt. Avoid fatty cuts of meat, processed or cured meats, and poultry with skin. Fill about one fourth of your plate with lean proteins, such as fish, chicken without skin, beans, eggs, or tofu. Avoid pre-made and processed foods. These tend to be higher in sodium, added sugar, and fat. Reduce your daily sodium intake. Many people with hypertension should eat less than 1,500 mg of sodium a day. Do not drink alcohol if: Your health care provider tells you not to drink. You are pregnant, may be pregnant, or are planning to become pregnant. If you drink alcohol: Limit how much you have to: 0-1 drink a day for women. 0-2 drinks a day for men. Know how much alcohol is in your drink. In the [place] one drink equals one 12 oz bottle of beer (355 mL), one 5 oz glass of wine (148 mL), or one 1 oz glass of hard liquor (44 mL). Lifestyle  Work with your health care provider to maintain a healthy body weight or to lose weight. Ask what an ideal weight is for you. Get at least 30 minutes of exercise that causes your heart to beat faster (aerobic exercise) most days of the week. Activities may include walking, swimming, or biking. Include exercise to strengthen your muscles (resistance exercise), such as Pilates or lifting weights, as part of your weekly exercise routine. Try to do these types of exercises for 30 minutes at least 3 days a week. Do not use any products that contain nicotine or tobacco. These products include cigarettes, chewing tobacco, and vaping devices, such as e-cigarettes. If you need help quitting, ask your health care provider. Monitor your blood pressure at home as told by your health care provider. Keep all follow-up visits. This is important. Medicines Take over-the-counter and prescription medicines only as told by your health care provider. Follow directions carefully. Blood  pressure medicines must be taken as prescribed. Do not skip doses of blood pressure medicine. Doing this puts you at risk for problems and can make the medicine less effective. Ask your health care provider about side effects or reactions to medicines that you should watch for. Contact a health care provider if you: Think you are having a reaction to a medicine you are taking. Have headaches that keep coming back (recurring). Feel dizzy. Have swelling in your ankles. Have trouble with your vision. Get help right away if you: Develop a severe headache or confusion. Have unusual weakness or numbness. Feel faint. Have severe pain in your chest or abdomen. Vomit repeatedly. Have trouble breathing. These symptoms may be an emergency. Get help right away. Call 911. Do not wait to see if the symptoms will go away. Do not drive yourself to the hospital. Summary Hypertension is when the force of blood pumping through your arteries is too strong. If this condition is not controlled, it may put you at risk for serious complications. Your personal target blood pressure may vary depending on your medical conditions, your age, and other factors. For most people, a normal blood pressure is less than 120/80. Hypertension is treated with lifestyle changes, medicines, or a combination of both. Lifestyle changes include losing weight, eating a healthy,   low-sodium diet, exercising more, and limiting alcohol. This information is not intended to replace advice given to you by your health care provider. Make sure you discuss any questions you have with your health care provider. Document Revised: 04/01/2021 Document Reviewed: 04/01/2021 Elsevier Patient Education  2024 [employer]  

## 2023-04-23 LAB — TSH: TSH: 8.91 u[IU]/mL — ABNORMAL HIGH (ref 0.35–5.50)

## 2023-04-23 LAB — COMPREHENSIVE METABOLIC PANEL
ALT: 28 U/L (ref 0–35)
AST: 20 U/L (ref 0–37)
Albumin: 4.5 g/dL (ref 3.5–5.2)
Alkaline Phosphatase: 77 U/L (ref 39–117)
BUN: 14 mg/dL (ref 6–23)
CO2: 28 meq/L (ref 19–32)
Calcium: 10.1 mg/dL (ref 8.4–10.5)
Chloride: 100 meq/L (ref 96–112)
Creatinine, Ser: 0.81 mg/dL (ref 0.40–1.20)
GFR: 73.46 mL/min (ref >60.00)
Glucose, Bld: 93 mg/dL (ref 70–99)
Potassium: 4.4 meq/L (ref 3.5–5.1)
Sodium: 139 meq/L (ref 135–145)
Total Bilirubin: 0.4 mg/dL (ref 0.2–1.2)
Total Protein: 7.2 g/dL (ref 6.0–8.3)

## 2023-04-23 LAB — CBC WITH DIFFERENTIAL/PLATELET
Basophils Absolute: 0.1 10*3/uL (ref 0.0–0.1)
Basophils Relative: 1.2 % (ref 0.0–3.0)
Eosinophils Absolute: 0.1 10*3/uL (ref 0.0–0.7)
Eosinophils Relative: 3 % (ref 0.0–5.0)
HCT: 43.2 % (ref 36.0–46.0)
Hemoglobin: 14.3 g/dL (ref 12.0–15.0)
Lymphocytes Relative: 30.5 % (ref 12.0–46.0)
Lymphs Abs: 1.5 10*3/uL (ref 0.7–4.0)
MCHC: 33.1 g/dL (ref 30.0–36.0)
MCV: 92.1 fL (ref 78.0–100.0)
Monocytes Absolute: 0.5 10*3/uL (ref 0.1–1.0)
Monocytes Relative: 10.2 % (ref 3.0–12.0)
Neutro Abs: 2.7 10*3/uL (ref 1.4–7.7)
Neutrophils Relative %: 55.1 % (ref 43.0–77.0)
Platelets: 306 10*3/uL (ref 150.0–400.0)
RBC: 4.69 Mil/uL (ref 3.87–5.11)
RDW: 13.2 % (ref 11.5–15.5)
WBC: 4.9 10*3/uL (ref 4.0–10.5)

## 2023-04-23 LAB — INSULIN, RANDOM: Insulin: 25.9 u[IU]/mL — ABNORMAL HIGH

## 2023-04-23 LAB — HEMOGLOBIN A1C: Hgb A1c MFr Bld: 5.9 % (ref 4.6–6.5)

## 2023-04-26 ENCOUNTER — Encounter: Payer: Self-pay | Admitting: Family Medicine

## 2023-04-26 ENCOUNTER — Encounter: Payer: Medicare PPO | Admitting: Family Medicine

## 2023-04-26 ENCOUNTER — Other Ambulatory Visit: Payer: Self-pay | Admitting: *Deleted

## 2023-04-28 MED ORDER — THYROID 15 MG PO TABS: 15.0000 mg | ORAL_TABLET | Freq: Every day | ORAL | 1 refills | Status: DC

## 2023-05-13 DIAGNOSIS — E119 Type 2 diabetes mellitus without complications: Secondary | ICD-10-CM | POA: Diagnosis not present

## 2023-05-17 ENCOUNTER — Ambulatory Visit (HOSPITAL_BASED_OUTPATIENT_CLINIC_OR_DEPARTMENT_OTHER): Payer: Medicare PPO

## 2023-05-18 DIAGNOSIS — I2510 Atherosclerotic heart disease of native coronary artery without angina pectoris: Secondary | ICD-10-CM | POA: Diagnosis not present

## 2023-05-18 DIAGNOSIS — E782 Mixed hyperlipidemia: Secondary | ICD-10-CM | POA: Diagnosis not present

## 2023-05-18 DIAGNOSIS — I480 Paroxysmal atrial fibrillation: Secondary | ICD-10-CM | POA: Diagnosis not present

## 2023-05-18 LAB — CBC

## 2023-05-19 LAB — LIPID PANEL
Chol/HDL Ratio: 4.4 {ratio} (ref 0.0–4.4)
Cholesterol, Total: 184 mg/dL (ref 100–199)
HDL: 42 mg/dL (ref >39)
LDL Chol Calc (NIH): 105 mg/dL — ABNORMAL HIGH (ref 0–99)
Triglycerides: 215 mg/dL — ABNORMAL HIGH (ref 0–149)
VLDL Cholesterol Cal: 37 mg/dL (ref 5–40)

## 2023-05-19 LAB — CBC
Hematocrit: 44.3 % (ref 34.0–46.6)
Hemoglobin: 14.2 g/dL (ref 11.1–15.9)
MCH: 29.8 pg (ref 26.6–33.0)
MCHC: 32.1 g/dL (ref 31.5–35.7)
MCV: 93 fL (ref 79–97)
Platelets: 253 10*3/uL (ref 150–450)
RBC: 4.76 x10E6/uL (ref 3.77–5.28)
RDW: 12.6 % (ref 11.7–15.4)
WBC: 3.9 10*3/uL (ref 3.4–10.8)

## 2023-05-19 LAB — COMPREHENSIVE METABOLIC PANEL
ALT: 27 [IU]/L (ref 0–32)
AST: 22 [IU]/L (ref 0–40)
Albumin: 4.3 g/dL (ref 3.9–4.9)
Alkaline Phosphatase: 82 [IU]/L (ref 44–121)
BUN/Creatinine Ratio: 17 (ref 12–28)
BUN: 13 mg/dL (ref 8–27)
Bilirubin Total: 0.3 mg/dL (ref 0.0–1.2)
CO2: 27 mmol/L (ref 20–29)
Calcium: 9.9 mg/dL (ref 8.7–10.3)
Chloride: 101 mmol/L (ref 96–106)
Creatinine, Ser: 0.75 mg/dL (ref 0.57–1.00)
Globulin, Total: 2.8 g/dL (ref 1.5–4.5)
Glucose: 121 mg/dL — ABNORMAL HIGH (ref 70–99)
Potassium: 4.7 mmol/L (ref 3.5–5.2)
Sodium: 140 mmol/L (ref 134–144)
Total Protein: 7.1 g/dL (ref 6.0–8.5)
eGFR: 86 mL/min/{1.73_m2} (ref >59)

## 2023-05-24 ENCOUNTER — Ambulatory Visit: Payer: Medicare PPO | Attending: Internal Medicine | Admitting: Internal Medicine

## 2023-05-24 VITALS — BP 148/88 | HR 75 | Ht 63.0 in | Wt 232.0 lb

## 2023-05-24 DIAGNOSIS — I10 Essential (primary) hypertension: Secondary | ICD-10-CM

## 2023-05-24 DIAGNOSIS — I480 Paroxysmal atrial fibrillation: Secondary | ICD-10-CM

## 2023-05-24 DIAGNOSIS — E782 Mixed hyperlipidemia: Secondary | ICD-10-CM | POA: Diagnosis not present

## 2023-05-24 DIAGNOSIS — J342 Deviated nasal septum: Secondary | ICD-10-CM | POA: Diagnosis not present

## 2023-05-24 DIAGNOSIS — G4733 Obstructive sleep apnea (adult) (pediatric): Secondary | ICD-10-CM | POA: Diagnosis not present

## 2023-05-24 NOTE — Progress Notes (Signed)
LIPID CLINIC CONSULT NOTE  Chief Complaint:  Follow-up dyslipidemia  Primary Care Physician: Bradd Canary, MD  Primary Cardiologist:  Garwin Brothers, MD  HPI:  Destiny [name] is a 72 y.o. female who is being seen today for the evaluation of dyslipidemia at the request of Bradd Canary, MD. This is a pleasant 72 year old female with past medical history significant for moderate obesity, hypertension, dyslipidemia, hypothyroidism, palpitations and prior chest pain which led to heart catheterization in November 2019.  This demonstrated a 50% proximal circumflex lesion which was not significant by FFR, a small 75% ostial diagonal lesion as well as a mid LAD lesion of 25% stenosis.  LVEDP was elevated at 20 mmHg and LVEF was normal.  Based on these findings aggressive medical therapy was recommended.  At that time her chest pain seemed somewhat atypical and according to her still persists.  It feels like a twinge and is present at rest and with exertion.  She was however found to have a markedly abnormal lipid profile.  In addition she is struggled with longstanding hypothyroidism and recently had converted from Armour Thyroid over to Synthroid.  Her dose was increased up to 100 mcg daily by her PCP in July.  Was a month ago which showed total cholesterol 263, HDL 39, triglycerides 216, and LDL 183.  He has had triglycerides over 400 in the past.  She reports her diet has been generally unhealthy and does eat a lot of fast food, fried foods and saturated fats.  Recently she started the 56day diet which she says is good so far and she feels like she would likely see a change in her lipids.  She also has a history of statin intolerance.  She has been tried on both rosuvastatin, atorvastatin, and ezetimibe, all of which have caused significant worsening of myalgias.  04/24/2019  Destiny [name] returns today for follow-up of dyslipidemia.  She successfully been using Repatha which is both been affordable  and easy to use.  She has no significant side effects.  She has discontinued her red yeast rice.  Unfortunately she has not had repeat lipid testing.  We will plan to do that today since she is fasting.  She denies any side effects such as myalgia.  10/23/2019  Destiny [name] is seen today for follow-up.  She is done well on Repatha.  Total cholesterol now 128, triglycerides 158, HDL 44 and LDL 51.  She reports some occasional chest discomfort, not associated with exertion always or relieved by rest.  It is not limited her activities.  She has not required any nitro.  She has not had a new prescription in a while.  Her cardiologist Dr. Tomie China saw her last in 2019.  I would advise follow-up there.  12/06/2020  Destiny [name] is seen today in follow-up.  Overall she continues to do well on Repatha.  Recent lipids showed a slight increase in triglycerides however she had some difficulty with her thyroid and was somewhat hypothyroid, now it has been repleted.  Total cholesterol 128, triglycerides 211, HDL 38 and LDL 56.  Overall she says she feels well denies chest pain or worsening shortness of breath.  05/07/2022  Destiny [name] returns today for follow-up.  She has had a significant improvement in her lipids on Repatha.  She is tolerating this quite well.  Total cholesterol now 130, triglycerides 84, HDL 54 and LDL 60.  She denies any chest pain or shortness of breath.  EKG today  is stable.  11/27/2022  Destiny [name] returns today for follow-up.  She had called in the office because her Apple Watch indicated an episode of atrial fibrillation.  She reports she has had episodes of palpitations going on for several years and decided to do an EKG with her Apple Watch at night because she felt strange.  This did show atrial fibrillation.  The overall duration was fairly short however given her age and comorbidities her CHA2DS2-VASc score is 4 and is increased risk of stroke.  EKG today shows she is back in sinus  rhythm.  05/24/2023  Destiny [name] is seen today in follow-up.  Since I last saw her she has had a lot of stress at home.  She was diagnosed with severe obstructive sleep apnea but has some trepidation about starting on therapy.  She says she might be willing to try it.  Her AHI was over 50 an hour.  Her cholesterol is gone up as well.  LDL is 105 now from 60.  She reports compliance with Repatha but she says her diet has "fallen apart.  She has taken a break from it.  Her A1c also was 5.9%.  Blood pressure is elevated today.  She says her thyroid was also "out of whack".  She intends to continue to work on this.  PMHx:  Past Medical History:  Diagnosis Date   Abnormal blood chemistry    Arthropathy    Back pain    Breast cyst    Cellulitis    Chest discomfort    Chronic bronchitis (HCC)    Constipation    Disturbance of skin sensation    Edema    Encounter for screening and preventative care 06/17/2016   Fatty liver    GERD (gastroesophageal reflux disease)    Hiatal hernia with gastroesophageal reflux 06/17/2016   High risk medication use    Hoarseness 06/15/2016   Hyperglycemia    Hyperglycemia 12/21/2016   Hyperlipidemia    Hyperlipidemia, mixed 06/15/2016   Hypertension    Hypopotassemia    Hypothyroidism    Joint pain    Menopause    Obesity    Palpitations    Positive ANA (antinuclear antibody)    Pre-diabetes    Shoulder pain, right    Skin cancer    Leg   Sleep apnea    denies   SOB (shortness of breath)    Thyroid disease 12/11/2014   Thyroid nodule    Urinary incontinence 04/01/2017   Vitamin D deficiency    Vitamin D deficiency     Past Surgical History:  Procedure Laterality Date   BREAST CYST ASPIRATION Left    30 years ago  1970's or 1980's   CORONARY PRESSURE/FFR STUDY N/A 04/25/2018   Procedure: INTRAVASCULAR PRESSURE WIRE/FFR STUDY;  Surgeon: Corky Crafts, MD;  Location: [facility] INVASIVE CV LAB;  Service: Cardiovascular;  Laterality: N/A;   LEFT HEART  CATH AND CORONARY ANGIOGRAPHY N/A 04/25/2018   Procedure: LEFT HEART CATH AND CORONARY ANGIOGRAPHY;  Surgeon: Corky Crafts, MD;  Location: [facility] INVASIVE CV LAB;  Service: Cardiovascular;  Laterality: N/A;   LIPOSUCTION  1990   over gluteal area    VEIN SURGERY Left 2022   WISDOM TOOTH EXTRACTION     and all upper teeth    FAMHx:  Family History  Problem Relation Age of Onset   Irregular heart beat Mother    Heart disease Mother    Rheumatic fever Mother    Hypertension  Father    Ulcers Father    Hyperlipidemia Father    Stroke Maternal Grandmother    Diabetes Maternal Grandmother    Hypertension Paternal Grandmother    Ulcers Paternal Grandfather     SOCHx:   reports that she quit smoking about 13 years ago. Her smoking use included cigarettes. She started smoking about 40 years ago. She has a 27 pack-year smoking history. She has never used smokeless tobacco. She reports current alcohol use. She reports that she does not use drugs.  ALLERGIES:  Allergies  Allergen Reactions   Crestor [Rosuvastatin Calcium] Other (See Comments)    myalgias   Diclofenac Itching, Nausea Only and Other (See Comments)    Wheezing   Hydrochlorothiazide     Wheezing. Losing teeth.   Statins     ROS: Pertinent items noted in HPI and remainder of comprehensive ROS otherwise negative.  HOME MEDS: Current Outpatient Medications on File Prior to Visit  Medication Sig Dispense Refill   albuterol (VENTOLIN HFA) 108 (90 Base) MCG/ACT inhaler Inhale 2 puffs into the lungs every 6 (six) hours as needed for wheezing or shortness of breath. 18 g 5   apixaban (ELIQUIS) 5 MG TABS tablet Take 1 tablet (5 mg total) by mouth 2 (two) times daily. 180 tablet 3   benzonatate (TESSALON) 100 MG capsule Take 1 capsule (100 mg total) by mouth 2 (two) times daily as needed for cough. 20 capsule 1   docusate sodium (DOK) 100 MG capsule Take 1 capsule (100 mg total) by mouth 2 (two) times daily as needed for  mild constipation. 180 capsule 1   Evolocumab (REPATHA SURECLICK) 140 MG/ML SOAJ Inject 140 mg into the skin every 14 (fourteen) days. 6 mL 2   furosemide (LASIX) 20 MG tablet Take 1 tablet (20 mg total) by mouth daily as needed for fluid or edema. prn weight gain>3#/24 hours, SOB 90 tablet 1   irbesartan (AVAPRO) 150 MG tablet Take 1 tablet (150 mg total) by mouth daily. 90 tablet 1   loperamide (IMODIUM) 2 MG capsule Take 1 capsule (2 mg total) by mouth 4 (four) times daily as needed for diarrhea or loose stools. 12 capsule 0   metoprolol succinate (TOPROL-XL) 25 MG 24 hr tablet Take 1 tablet (25 mg total) by mouth daily. 90 tablet 1   nitroGLYCERIN (NITROSTAT) 0.4 MG SL tablet Place 1 tablet (0.4 mg total) under the tongue every 5 (five) minutes as needed for chest pain. 25 tablet 3   ondansetron (ZOFRAN-ODT) 4 MG disintegrating tablet Take 1 tablet (4 mg total) by mouth every 8 (eight) hours as needed for nausea or vomiting. 12 tablet 0   thyroid (ARMOUR THYROID) 15 MG tablet Take 1 tablet (15 mg total) by mouth daily. Take with 120mg  90 tablet 1   thyroid (ARMOUR) 120 MG tablet Take 120 mg by mouth daily before breakfast.     Vibegron (GEMTESA) 75 MG TABS Take 1 tablet (75 mg total) by mouth daily. 30 tablet 5   Vitamin D, Ergocalciferol, (DRISDOL) 1.25 MG (50000 UNIT) CAPS capsule TAKE ONE CAPSULE BY MOUTH EVERY SEVEN DAYS 12 capsule 3   Semaglutide-Weight Management (WEGOVY) 0.25 MG/0.5ML SOAJ Inject 0.25 mg into the skin once a week. (Patient not taking: Reported on 05/24/2023) 2 mL 1   No current facility-administered medications on file prior to visit.    LABS/IMAGING: No results found for this or any previous visit (from the past 48 hours). No results found.  LIPID PANEL:  Component Value Date/Time   CHOL 184 05/18/2023 1015   TRIG 215 (H) 05/18/2023 1015   HDL 42 05/18/2023 1015   CHOLHDL 4.4 05/18/2023 1015   CHOLHDL 3 04/18/2020 1418   VLDL 47.4 (H) 04/18/2020 1418    LDLCALC 105 (H) 05/18/2023 1015   LDLCALC 183 (H) 12/08/2018 1531   LDLDIRECT 71.0 04/18/2020 1418    WEIGHTS: Wt Readings from Last 3 Encounters:  05/24/23 232 lb (105.2 kg)  04/22/23 232 lb 6.4 oz (105.4 kg)  04/02/23 222 lb 10.6 oz (101 kg)    VITALS: BP (!) 148/88 (BP Location: Right Arm, Patient Position: Sitting, Cuff Size: Normal)   Pulse 75   Ht 5\' 3"  (1.6 m)   Wt 232 lb (105.2 kg)   SpO2 94%   BMI 41.10 kg/m   EXAM: General appearance: alert and no distress Lungs: clear to auscultation bilaterally Heart: regular rate and rhythm Extremities: extremities normal, atraumatic, no cyanosis or edema Neurologic: Grossly normal  EKG: EKG Interpretation Date/Time:  Monday May 24 2023 14:00:08 EST Ventricular Rate:  72 PR Interval:  162 QRS Duration:  98 QT Interval:  394 QTC Calculation: 431 R Axis:   -73  Text Interpretation: Normal sinus rhythm Incomplete right bundle branch block Left anterior fascicular block Minimal voltage criteria for LVH, may be normal variant ( Cornell product ) When compared with ECG of 02-Apr-2023 18:35, PREVIOUS ECG IS PRESENT Compared to previous tracing the rate is slower, and the QRS is narrower Confirmed by Zoila Shutter [phone]) on 05/24/2023 2:14:01 PM   ASSESSMENT: Paroxysmal atrial fibrillation-CHA2DS2-VASc score 4 Mixed dyslipidemia with elevated LDL and triglycerides Coronary artery disease with moderate to severe disease by cath (04/2018)-managed medically Morbid obesity Hypertension Palpitations Hypothyroidism Severe OSA  PLAN: 1.   Destiny [name] has had a lot of stressors recently.  EKG does not show any A-fib today.  She is anticoagulated.  She was diagnosed with obstructive sleep apnea.  She is hesitant to use CPAP therapy.  She also notes difficulty breathing and feels like she might have a deviated nasal septum.  I will refer her for evaluation and other possible options that might help her with this.  I have encouraged  her to trial CPAP therapy.  She needs to concentrate again on diet and weight loss.  This is likely responsible for an increase in cholesterol.  Plan follow-up in 6 months or sooner as necessary.  Chrystie Nose, MD, [facility], FACP  [place]  [facility]  Medical Director of the Advanced Lipid Disorders &  Cardiovascular Risk Reduction Clinic Diplomate of the American Board of Clinical Lipidology Attending Cardiologist  Direct Dial: [phone]  Fax: [phone]  Website:  [email or web address] Nicely Destiny [name] 05/24/2023, 2:14 PM

## 2023-05-24 NOTE — Patient Instructions (Addendum)
Medication Instructions:  NO CHANGES  *If you need a refill on your cardiac medications before your next appointment, please call your pharmacy*   Lab Work: FASTING lab work in 6 months -- complete about ONE WEEK before next appointment   Follow-Up: At [facility], you and your health needs are our priority.  As part of our continuing mission to provide you with exceptional heart care, we have created designated Provider Care Teams.  These Care Teams include your primary Cardiologist (physician) and Advanced Practice Providers (APPs -  Physician Assistants and Nurse Practitioners) who all work together to provide you with the care you need, when you need it.  We recommend signing up for the patient portal called "MyChart".  Sign up information is provided on this After Visit Summary.  MyChart is used to connect with patients for Virtual Visits (Telemedicine).  Patients are able to view lab/test results, encounter notes, upcoming appointments, etc.  Non-urgent messages can be sent to your provider as well.   To learn more about what you can do with MyChart, go to [email or web address].    Your next appointment:    6 months with Dr. Rennis Golden Other Instructions You have been referred to Dr. Marene Lenz with [facility]

## 2023-06-07 ENCOUNTER — Encounter: Payer: Self-pay | Admitting: Internal Medicine

## 2023-06-07 ENCOUNTER — Ambulatory Visit (HOSPITAL_BASED_OUTPATIENT_CLINIC_OR_DEPARTMENT_OTHER)
Admission: RE | Admit: 2023-06-07 | Discharge: 2023-06-07 | Disposition: A | Discharge status: Home / Self Care | Payer: Medicare PPO | Source: Ambulatory Visit | Attending: Family Medicine | Admitting: Family Medicine

## 2023-06-07 DIAGNOSIS — R011 Cardiac murmur, unspecified: Secondary | ICD-10-CM | POA: Diagnosis not present

## 2023-06-07 LAB — ECHOCARDIOGRAM COMPLETE
AR max vel: 1.87 cm2
AV Area VTI: 2.09 cm2
AV Area mean vel: 1.83 cm2
AV Mean grad: 8 mm[Hg]
AV Peak grad: 12.4 mm[Hg]
Ao pk vel: 1.76 m/s
Area-P 1/2: 1.94 cm2
Calc EF: 71.5 %
MV M vel: 3.01 m/s
MV Peak grad: 36.2 mm[Hg]
MV VTI: 1.84 cm2
S' Lateral: 2.1 cm
Single Plane A2C EF: 69.5 %
Single Plane A4C EF: 75.9 %

## 2023-06-25 ENCOUNTER — Encounter: Payer: Self-pay | Admitting: Family Medicine

## 2023-06-28 ENCOUNTER — Other Ambulatory Visit: Payer: Self-pay | Admitting: Emergency Medicine

## 2023-06-28 MED ORDER — THYROID 15 MG PO TABS: 15.0000 mg | ORAL_TABLET | Freq: Every day | ORAL | 1 refills | Status: DC

## 2023-07-06 ENCOUNTER — Encounter: Payer: Self-pay | Admitting: Family Medicine

## 2023-07-13 ENCOUNTER — Ambulatory Visit: Payer: Medicare PPO | Admitting: Family Medicine

## 2023-07-23 ENCOUNTER — Ambulatory Visit: Payer: Medicare PPO | Attending: Internal Medicine | Admitting: Internal Medicine

## 2023-07-23 ENCOUNTER — Encounter: Payer: Self-pay | Admitting: Internal Medicine

## 2023-07-23 VITALS — BP 142/66 | HR 68 | Ht 63.0 in | Wt 236.2 lb

## 2023-07-23 DIAGNOSIS — I3481 Nonrheumatic mitral (valve) annulus calcification: Secondary | ICD-10-CM

## 2023-07-23 DIAGNOSIS — I2510 Atherosclerotic heart disease of native coronary artery without angina pectoris: Secondary | ICD-10-CM | POA: Diagnosis not present

## 2023-07-23 MED ORDER — NITROGLYCERIN 0.4 MG SL SUBL: 0.4000 mg | SUBLINGUAL_TABLET | SUBLINGUAL | 1 refills | Status: AC | PRN

## 2023-07-23 MED ORDER — FUROSEMIDE 40 MG PO TABS: 40.0000 mg | ORAL_TABLET | Freq: Every day | ORAL | 0 refills | Status: DC

## 2023-07-23 NOTE — Progress Notes (Signed)
LIPID CLINIC CONSULT NOTE  Chief Complaint:  Follow-up dyslipidemia  Primary Care Physician: Bradd Canary, MD  Primary Cardiologist:  Chrystie Nose, MD  HPI:  Destiny [name] is a 73 y.o. female who is being seen today for the evaluation of dyslipidemia at the request of Bradd Canary, MD. This is a pleasant 73 year old female with past medical history significant for moderate obesity, hypertension, dyslipidemia, hypothyroidism, palpitations and prior chest pain which led to heart catheterization in November 2019.  This demonstrated a 50% proximal circumflex lesion which was not significant by FFR, a small 75% ostial diagonal lesion as well as a mid LAD lesion of 25% stenosis.  LVEDP was elevated at 20 mmHg and LVEF was normal.  Based on these findings aggressive medical therapy was recommended.  At that time her chest pain seemed somewhat atypical and according to her still persists.  It feels like a twinge and is present at rest and with exertion.  She was however found to have a markedly abnormal lipid profile.  In addition she is struggled with longstanding hypothyroidism and recently had converted from Armour Thyroid over to Synthroid.  Her dose was increased up to 100 mcg daily by her PCP in July.  Was a month ago which showed total cholesterol 263, HDL 39, triglycerides 216, and LDL 183.  He has had triglycerides over 400 in the past.  She reports her diet has been generally unhealthy and does eat a lot of fast food, fried foods and saturated fats.  Recently she started the 56day diet which she says is good so far and she feels like she would likely see a change in her lipids.  She also has a history of statin intolerance.  She has been tried on both rosuvastatin, atorvastatin, and ezetimibe, all of which have caused significant worsening of myalgias.  04/24/2019  Destiny [name] returns today for follow-up of dyslipidemia.  She successfully been using Repatha which is both been affordable  and easy to use.  She has no significant side effects.  She has discontinued her red yeast rice.  Unfortunately she has not had repeat lipid testing.  We will plan to do that today since she is fasting.  She denies any side effects such as myalgia.  10/23/2019  Destiny [name] is seen today for follow-up.  She is done well on Repatha.  Total cholesterol now 128, triglycerides 158, HDL 44 and LDL 51.  She reports some occasional chest discomfort, not associated with exertion always or relieved by rest.  It is not limited her activities.  She has not required any nitro.  She has not had a new prescription in a while.  Her cardiologist Dr. Tomie China saw her last in 2019.  I would advise follow-up there.  12/06/2020  Destiny [name] is seen today in follow-up.  Overall she continues to do well on Repatha.  Recent lipids showed a slight increase in triglycerides however she had some difficulty with her thyroid and was somewhat hypothyroid, now it has been repleted.  Total cholesterol 128, triglycerides 211, HDL 38 and LDL 56.  Overall she says she feels well denies chest pain or worsening shortness of breath.  05/07/2022  Destiny [name] returns today for follow-up.  She has had a significant improvement in her lipids on Repatha.  She is tolerating this quite well.  Total cholesterol now 130, triglycerides 84, HDL 54 and LDL 60.  She denies any chest pain or shortness of breath.  EKG today  is stable.  11/27/2022  Destiny [name] returns today for follow-up.  She had called in the office because her Apple Watch indicated an episode of atrial fibrillation.  She reports she has had episodes of palpitations going on for several years and decided to do an EKG with her Apple Watch at night because she felt strange.  This did show atrial fibrillation.  The overall duration was fairly short however given her age and comorbidities her CHA2DS2-VASc score is 4 and is increased risk of stroke.  EKG today shows she is back in sinus  rhythm.  05/24/2023  Destiny [name] is seen today in follow-up.  Since I last saw her she has had a lot of stress at home.  She was diagnosed with severe obstructive sleep apnea but has some trepidation about starting on therapy.  She says she might be willing to try it.  Her AHI was over 50 an hour.  Her cholesterol is gone up as well.  LDL is 105 now from 60.  She reports compliance with Repatha but she says her diet has "fallen apart.  She has taken a break from it.  Her A1c also was 5.9%.  Blood pressure is elevated today.  She says her thyroid was also "out of whack".  She intends to continue to work on this.  07/23/2023  Destiny [name] is seen today in follow-up.  She had recent echocardiography performed by her primary care provider in December for murmur.  Echo showed LVEF 70 to 75%, grade 1 diastolic dysfunction, there was a description of a cystic appearance of the posterior mitral annulus consistent with caseous mitral annular calcification.  There was mild mitral stenosis.  I discussed these findings with her today.  We scheduled this appointment as the echocardiographic abnormality needs further evaluation.  In review of the literature caseous mitral annular calcification can be associated with arrhythmias, increased risk for stroke and possibly endocarditis.  How to [place] manage it is actually debatable however there is some literature suggesting surgical resection and mitral valve reconstruction has been performed.  At the current time is not clear that she is symptomatic.  PMHx:  Past Medical History:  Diagnosis Date   Abnormal blood chemistry    Arthropathy    Back pain    Breast cyst    Cellulitis    Chest discomfort    Chronic bronchitis (HCC)    Constipation    Disturbance of skin sensation    Edema    Encounter for screening and preventative care 06/17/2016   Fatty liver    GERD (gastroesophageal reflux disease)    Hiatal hernia with gastroesophageal reflux 06/17/2016   High risk  medication use    Hoarseness 06/15/2016   Hyperglycemia    Hyperglycemia 12/21/2016   Hyperlipidemia    Hyperlipidemia, mixed 06/15/2016   Hypertension    Hypopotassemia    Hypothyroidism    Joint pain    Menopause    Obesity    Palpitations    Positive ANA (antinuclear antibody)    Pre-diabetes    Shoulder pain, right    Skin cancer    Leg   Sleep apnea    denies   SOB (shortness of breath)    Thyroid disease 12/11/2014   Thyroid nodule    Urinary incontinence 04/01/2017   Vitamin D deficiency    Vitamin D deficiency     Past Surgical History:  Procedure Laterality Date   BREAST CYST ASPIRATION Left    30 years  ago  1970's or 1980's   CORONARY PRESSURE/FFR STUDY N/A 04/25/2018   Procedure: INTRAVASCULAR PRESSURE WIRE/FFR STUDY;  Surgeon: Corky Crafts, MD;  Location[facility] INVASIVE CV LAB;  Service: Cardiovascular;  Laterality: N/A;   LEFT HEART CATH AND CORONARY ANGIOGRAPHY N/A 04/25/2018   Procedure: LEFT HEART CATH AND CORONARY ANGIOGRAPHY;  Surgeon: Corky Crafts, MD;  Location: [facility] INVASIVE CV LAB;  Service: Cardiovascular;  Laterality: N/A;   LIPOSUCTION  1990   over gluteal area    VEIN SURGERY Left 2022   WISDOM TOOTH EXTRACTION     and all upper teeth    FAMHx:  Family History  Problem Relation Age of Onset   Irregular heart beat Mother    Heart disease Mother    Rheumatic fever Mother    Hypertension Father    Ulcers Father    Hyperlipidemia Father    Stroke Maternal Grandmother    Diabetes Maternal Grandmother    Hypertension Paternal Grandmother    Ulcers Paternal Grandfather     SOCHx:   reports that she quit smoking about 14 years ago. Her smoking use included cigarettes. She started smoking about 41 years ago. She has a 27 pack-year smoking history. She has never used smokeless tobacco. She reports current alcohol use. She reports that she does not use drugs.  ALLERGIES:  Allergies  Allergen Reactions   Crestor [Rosuvastatin Calcium]  Other (See Comments)    myalgias   Diclofenac Itching, Nausea Only and Other (See Comments)    Wheezing   Hydrochlorothiazide     Wheezing. Losing teeth.   Statins     ROS: Pertinent items noted in HPI and remainder of comprehensive ROS otherwise negative.  HOME MEDS: Current Outpatient Medications on File Prior to Visit  Medication Sig Dispense Refill   albuterol (VENTOLIN HFA) 108 (90 Base) MCG/ACT inhaler Inhale 2 puffs into the lungs every 6 (six) hours as needed for wheezing or shortness of breath. 18 g 5   apixaban (ELIQUIS) 5 MG TABS tablet Take 1 tablet (5 mg total) by mouth 2 (two) times daily. 180 tablet 3   benzonatate (TESSALON) 100 MG capsule Take 1 capsule (100 mg total) by mouth 2 (two) times daily as needed for cough. 20 capsule 1   docusate sodium (DOK) 100 MG capsule Take 1 capsule (100 mg total) by mouth 2 (two) times daily as needed for mild constipation. 180 capsule 1   Evolocumab (REPATHA SURECLICK) 140 MG/ML SOAJ Inject 140 mg into the skin every 14 (fourteen) days. 6 mL 2   irbesartan (AVAPRO) 150 MG tablet Take 1 tablet (150 mg total) by mouth daily. 90 tablet 1   metoprolol succinate (TOPROL-XL) 25 MG 24 hr tablet Take 1 tablet (25 mg total) by mouth daily. 90 tablet 1   ondansetron (ZOFRAN-ODT) 4 MG disintegrating tablet Take 1 tablet (4 mg total) by mouth every 8 (eight) hours as needed for nausea or vomiting. 12 tablet 0   thyroid (ARMOUR THYROID) 15 MG tablet Take 1 tablet (15 mg total) by mouth daily. Take with 120mg  90 tablet 1   thyroid (ARMOUR) 120 MG tablet Take 120 mg by mouth daily before breakfast.     Vibegron (GEMTESA) 75 MG TABS Take 1 tablet (75 mg total) by mouth daily. 30 tablet 5   Vitamin D, Ergocalciferol, (DRISDOL) 1.25 MG (50000 UNIT) CAPS capsule TAKE ONE CAPSULE BY MOUTH EVERY SEVEN DAYS 12 capsule 3   loperamide (IMODIUM) 2 MG capsule Take 1 capsule (2  mg total) by mouth 4 (four) times daily as needed for diarrhea or loose stools.  ([name] not taking: Reported on 07/23/2023) 12 capsule 0   No current facility-administered medications on file prior to visit.    LABS/IMAGING: No results found for this or any previous visit (from the past 48 hours). No results found.  LIPID PANEL:    Component Value Date/Time   CHOL 184 05/18/2023 1015   TRIG 215 (H) 05/18/2023 1015   HDL 42 05/18/2023 1015   CHOLHDL 4.4 05/18/2023 1015   CHOLHDL 3 04/18/2020 1418   VLDL 47.4 (H) 04/18/2020 1418   LDLCALC 105 (H) 05/18/2023 1015   LDLCALC 183 (H) 12/08/2018 1531   LDLDIRECT 71.0 04/18/2020 1418    WEIGHTS: Wt Readings from Last 3 Encounters:  07/23/23 236 lb 3.2 oz (107.1 kg)  05/24/23 232 lb (105.2 kg)  04/22/23 232 lb 6.4 oz (105.4 kg)    VITALS: BP (!) 142/66 (BP Location: Left Arm, [name] Position: Sitting, Cuff Size: Normal)   Pulse 68   Ht 5\' 3"  (1.6 m)   Wt 236 lb 3.2 oz (107.1 kg)   SpO2 97%   BMI 41.84 kg/m   EXAM: General appearance: alert and no distress Lungs: clear to auscultation bilaterally Heart: regular rate and rhythm, S1, S2 normal, and systolic murmur: early systolic 2/6, blowing at apex Extremities: extremities normal, atraumatic, no cyanosis or edema Neurologic: Grossly normal  EKG: Deferred  ASSESSMENT: Caseous posterior mitral annular calcification Paroxysmal atrial fibrillation-CHA2DS2-VASc score 4 Mixed dyslipidemia with elevated LDL and triglycerides Coronary artery disease with moderate to severe disease by cath (04/2018)-managed medically Morbid obesity Hypertension Palpitations Hypothyroidism Severe OSA  PLAN: 1.   Destiny [name] is noted to have caseous mitral annular calcification of the posterior leaflet with a large mass measuring about 3 x 3 cm.  There is associated mitral stenosis and regurgitation.  In reviewing the literature since this is quite rare, it does appear that there are risk factors including stroke, arrhythmia, endocarditis and other associated diseases.  I  believe the mitral annulus needs to be better evaluated.  Will try to schedule for TEE however since I am not available next week I will try to schedule with one of my partners as soon as possible.  Based on these findings, she may need CT surgical evaluation by Dr. Leafy Ro.    Informed Consent   Shared Decision Making/Informed Consent   The risks [esophageal damage, perforation (1:10,000 risk), bleeding, pharyngeal hematoma as well as other potential complications associated with conscious sedation including aspiration, arrhythmia, respiratory failure and death], benefits (treatment guidance and diagnostic support) and alternatives of a transesophageal echocardiogram were discussed in detail with Destiny [name] and she is willing to proceed.     Follow-up with me after.  Chrystie Nose, MD, [facility], FACP  [place]  [facility]  Medical Director of the Advanced Lipid Disorders &  Cardiovascular Risk Reduction Clinic Diplomate of the American Board of Clinical Lipidology Attending Cardiologist  Direct Dial: [phone]  Fax: [phone]  Website:  [email or web address] 07/25/2023, 4:01 PM

## 2023-07-23 NOTE — Patient Instructions (Signed)
Medication Instructions:  Increase: Furosemide (Lasix) to 40 mg once daily  Lab Work: CBC, BMET, BNP in about one week    Testing/Procedures:   Dear Destiny [name]  You are scheduled for a TEE (Transesophageal Echocardiogram) on Thursday, March 6 with Dr. Izora Ribas.  Please arrive at the [facility] (Main Entrance A) at [facility]: [address] [place], [place] [address] at 1:00 PM (This time is 1 hour(s) before your procedure to ensure your preparation).   Free valet parking service is available. You will check in at ADMITTING.   *Please Note: You will receive a call the day before your procedure to confirm the appointment time. That time may have changed from the original time based on the schedule for that day.*   DIET:  Nothing to eat or drink after midnight except a sip of water with medications (see medication instructions below)  MEDICATION INSTRUCTIONS: !!IF ANY NEW MEDICATIONS ARE STARTED AFTER TODAY, PLEASE NOTIFY YOUR PROVIDER AS SOON AS POSSIBLE!!  FYI: Medications such as Semaglutide (Ozempic, [place]), Tirzepatide (Mounjaro, Zepbound), Dulaglutide (Trulicity), etc ("GLP1 agonists") AND Canagliflozin (Invokana), Dapagliflozin (Farxiga), Empagliflozin (Jardiance), Ertugliflozin (Steglatro), Bexagliflozin [employer]) or any combination with one of these drugs such as Invokamet (Canagliflozin/Metformin), Synjardy (Empagliflozin/Metformin), etc ("SGLT2 inhibitors") must be held around the time of a procedure. This is not a comprehensive list of all of these drugs. Please review all of your medications and talk to your provider if you take any one of these. If you are not sure, ask your provider.    Continue taking your anticoagulant (blood thinner): Apixaban (Eliquis).  You will need to continue this after your procedure until you are told by your provider that it is safe to stop.    LABS: CBC, BMET, BNP by 08/03/2023  FYI:  For your safety, and to allow [place] to  monitor your vital signs accurately during the surgery/procedure we request: If you have artificial nails, gel coating, SNS etc, please have those removed prior to your surgery/procedure. Not having the nail coverings /polish removed may result in cancellation or delay of your surgery/procedure.  Your support person will be asked to wait in the waiting room during your procedure.  It is OK to have someone drop you off and come back when you are ready to be discharged.  You cannot drive after the procedure and will need someone to drive you home.  Bring your insurance cards.  *Special Note: Every effort is made to have your procedure done on time. Occasionally there are emergencies that occur at the hospital that may cause delays. Please be patient if a delay does occur.       Follow-Up: At [facility], you and your health needs are our priority.  As part of our continuing mission to provide you with exceptional heart care, we have created designated Provider Care Teams.  These Care Teams include your primary Cardiologist (physician) and Advanced Practice Providers (APPs -  Physician Assistants and Nurse Practitioners) who all work together to provide you with the care you need, when you need it.   Your next appointment:    We will get in touch with you for follow up with Dr. Rennis Golden, pending TEE results  Provider:   Chrystie Nose, MD

## 2023-07-23 NOTE — H&P (View-Only) (Signed)
 LIPID CLINIC CONSULT NOTE  Chief Complaint:  Follow-up dyslipidemia  Primary Care Physician: Bradd Canary, MD  Primary Cardiologist:  Chrystie Nose, MD  HPI:  Destiny [name] is a 73 y.o. female who is being seen today for the evaluation of dyslipidemia at the request of Bradd Canary, MD. This is a pleasant 73 year old female with past medical history significant for moderate obesity, hypertension, dyslipidemia, hypothyroidism, palpitations and prior chest pain which led to heart catheterization in November 2019.  This demonstrated a 50% proximal circumflex lesion which was not significant by FFR, a small 75% ostial diagonal lesion as well as a mid LAD lesion of 25% stenosis.  LVEDP was elevated at 20 mmHg and LVEF was normal.  Based on these findings aggressive medical therapy was recommended.  At that time her chest pain seemed somewhat atypical and according to her still persists.  It feels like a twinge and is present at rest and with exertion.  She was however found to have a markedly abnormal lipid profile.  In addition she is struggled with longstanding hypothyroidism and recently had converted from Armour Thyroid over to Synthroid.  Her dose was increased up to 100 mcg daily by her PCP in July.  Was a month ago which showed total cholesterol 263, HDL 39, triglycerides 216, and LDL 183.  He has had triglycerides over 400 in the past.  She reports her diet has been generally unhealthy and does eat a lot of fast food, fried foods and saturated fats.  Recently she started the 56day diet which she says is good so far and she feels like she would likely see a change in her lipids.  She also has a history of statin intolerance.  She has been tried on both rosuvastatin, atorvastatin, and ezetimibe, all of which have caused significant worsening of myalgias.  04/24/2019  Destiny [name] returns today for follow-up of dyslipidemia.  She successfully been using Repatha which is both been affordable  and easy to use.  She has no significant side effects.  She has discontinued her red yeast rice.  Unfortunately she has not had repeat lipid testing.  We will plan to do that today since she is fasting.  She denies any side effects such as myalgia.  10/23/2019  Destiny [name] is seen today for follow-up.  She is done well on Repatha.  Total cholesterol now 128, triglycerides 158, HDL 44 and LDL 51.  She reports some occasional chest discomfort, not associated with exertion always or relieved by rest.  It is not limited her activities.  She has not required any nitro.  She has not had a new prescription in a while.  Her cardiologist Dr. Tomie China saw her last in 2019.  I would advise follow-up there.  12/06/2020  Destiny [name] is seen today in follow-up.  Overall she continues to do well on Repatha.  Recent lipids showed a slight increase in triglycerides however she had some difficulty with her thyroid and was somewhat hypothyroid, now it has been repleted.  Total cholesterol 128, triglycerides 211, HDL 38 and LDL 56.  Overall she says she feels well denies chest pain or worsening shortness of breath.  05/07/2022  Destiny [name] returns today for follow-up.  She has had a significant improvement in her lipids on Repatha.  She is tolerating this quite well.  Total cholesterol now 130, triglycerides 84, HDL 54 and LDL 60.  She denies any chest pain or shortness of breath.  EKG today  is stable.  11/27/2022  Destiny [name] returns today for follow-up.  She had called in the office because her Apple Watch indicated an episode of atrial fibrillation.  She reports she has had episodes of palpitations going on for several years and decided to do an EKG with her Apple Watch at night because she felt strange.  This did show atrial fibrillation.  The overall duration was fairly short however given her age and comorbidities her CHA2DS2-VASc score is 4 and is increased risk of stroke.  EKG today shows she is back in sinus  rhythm.  05/24/2023  Destiny [name] is seen today in follow-up.  Since I last saw her she has had a lot of stress at home.  She was diagnosed with severe obstructive sleep apnea but has some trepidation about starting on therapy.  She says she might be willing to try it.  Her AHI was over 50 an hour.  Her cholesterol is gone up as well.  LDL is 105 now from 60.  She reports compliance with Repatha but she says her diet has "fallen apart.  She has taken a break from it.  Her A1c also was 5.9%.  Blood pressure is elevated today.  She says her thyroid was also "out of whack".  She intends to continue to work on this.  07/23/2023  Destiny [name] is seen today in follow-up.  She had recent echocardiography performed by her primary care provider in December for murmur.  Echo showed LVEF 70 to 75%, grade 1 diastolic dysfunction, there was a description of a cystic appearance of the posterior mitral annulus consistent with caseous mitral annular calcification.  There was mild mitral stenosis.  I discussed these findings with her today.  We scheduled this appointment as the echocardiographic abnormality needs further evaluation.  In review of the literature caseous mitral annular calcification can be associated with arrhythmias, increased risk for stroke and possibly endocarditis.  How to [place] manage it is actually debatable however there is some literature suggesting surgical resection and mitral valve reconstruction has been performed.  At the current time is not clear that she is symptomatic.  PMHx:  Past Medical History:  Diagnosis Date   Abnormal blood chemistry    Arthropathy    Back pain    Breast cyst    Cellulitis    Chest discomfort    Chronic bronchitis (HCC)    Constipation    Disturbance of skin sensation    Edema    Encounter for screening and preventative care 06/17/2016   Fatty liver    GERD (gastroesophageal reflux disease)    Hiatal hernia with gastroesophageal reflux 06/17/2016   High risk  medication use    Hoarseness 06/15/2016   Hyperglycemia    Hyperglycemia 12/21/2016   Hyperlipidemia    Hyperlipidemia, mixed 06/15/2016   Hypertension    Hypopotassemia    Hypothyroidism    Joint pain    Menopause    Obesity    Palpitations    Positive ANA (antinuclear antibody)    Pre-diabetes    Shoulder pain, right    Skin cancer    Leg   Sleep apnea    denies   SOB (shortness of breath)    Thyroid disease 12/11/2014   Thyroid nodule    Urinary incontinence 04/01/2017   Vitamin D deficiency    Vitamin D deficiency     Past Surgical History:  Procedure Laterality Date   BREAST CYST ASPIRATION Left    30 years  ago  1970's or 1980's   CORONARY PRESSURE/FFR STUDY N/A 04/25/2018   Procedure: INTRAVASCULAR PRESSURE WIRE/FFR STUDY;  Surgeon: Corky Crafts, MD;  Location: [facility] INVASIVE CV LAB;  Service: Cardiovascular;  Laterality: N/A;   LEFT HEART CATH AND CORONARY ANGIOGRAPHY N/A 04/25/2018   Procedure: LEFT HEART CATH AND CORONARY ANGIOGRAPHY;  Surgeon: Corky Crafts, MD;  Location: [facility] INVASIVE CV LAB;  Service: Cardiovascular;  Laterality: N/A;   LIPOSUCTION  1990   over gluteal area    VEIN SURGERY Left 2022   WISDOM TOOTH EXTRACTION     and all upper teeth    FAMHx:  Family History  Problem Relation Age of Onset   Irregular heart beat Mother    Heart disease Mother    Rheumatic fever Mother    Hypertension Father    Ulcers Father    Hyperlipidemia Father    Stroke Maternal Grandmother    Diabetes Maternal Grandmother    Hypertension Paternal Grandmother    Ulcers Paternal Grandfather     SOCHx:   reports that she quit smoking about 14 years ago. Her smoking use included cigarettes. She started smoking about 41 years ago. She has a 27 pack-year smoking history. She has never used smokeless tobacco. She reports current alcohol use. She reports that she does not use drugs.  ALLERGIES:  Allergies  Allergen Reactions   Crestor [Rosuvastatin Calcium]  Other (See Comments)    myalgias   Diclofenac Itching, Nausea Only and Other (See Comments)    Wheezing   Hydrochlorothiazide     Wheezing. Losing teeth.   Statins     ROS: Pertinent items noted in HPI and remainder of comprehensive ROS otherwise negative.  HOME MEDS: Current Outpatient Medications on File Prior to Visit  Medication Sig Dispense Refill   albuterol (VENTOLIN HFA) 108 (90 Base) MCG/ACT inhaler Inhale 2 puffs into the lungs every 6 (six) hours as needed for wheezing or shortness of breath. 18 g 5   apixaban (ELIQUIS) 5 MG TABS tablet Take 1 tablet (5 mg total) by mouth 2 (two) times daily. 180 tablet 3   benzonatate (TESSALON) 100 MG capsule Take 1 capsule (100 mg total) by mouth 2 (two) times daily as needed for cough. 20 capsule 1   docusate sodium (DOK) 100 MG capsule Take 1 capsule (100 mg total) by mouth 2 (two) times daily as needed for mild constipation. 180 capsule 1   Evolocumab (REPATHA SURECLICK) 140 MG/ML SOAJ Inject 140 mg into the skin every 14 (fourteen) days. 6 mL 2   irbesartan (AVAPRO) 150 MG tablet Take 1 tablet (150 mg total) by mouth daily. 90 tablet 1   metoprolol succinate (TOPROL-XL) 25 MG 24 hr tablet Take 1 tablet (25 mg total) by mouth daily. 90 tablet 1   ondansetron (ZOFRAN-ODT) 4 MG disintegrating tablet Take 1 tablet (4 mg total) by mouth every 8 (eight) hours as needed for nausea or vomiting. 12 tablet 0   thyroid (ARMOUR THYROID) 15 MG tablet Take 1 tablet (15 mg total) by mouth daily. Take with 120mg  90 tablet 1   thyroid (ARMOUR) 120 MG tablet Take 120 mg by mouth daily before breakfast.     Vibegron (GEMTESA) 75 MG TABS Take 1 tablet (75 mg total) by mouth daily. 30 tablet 5   Vitamin D, Ergocalciferol, (DRISDOL) 1.25 MG (50000 UNIT) CAPS capsule TAKE ONE CAPSULE BY MOUTH EVERY SEVEN DAYS 12 capsule 3   loperamide (IMODIUM) 2 MG capsule Take 1 capsule (2  mg total) by mouth 4 (four) times daily as needed for diarrhea or loose stools.  ([name] not taking: Reported on 07/23/2023) 12 capsule 0   No current facility-administered medications on file prior to visit.    LABS/IMAGING: No results found for this or any previous visit (from the past 48 hours). No results found.  LIPID PANEL:    Component Value Date/Time   CHOL 184 05/18/2023 1015   TRIG 215 (H) 05/18/2023 1015   HDL 42 05/18/2023 1015   CHOLHDL 4.4 05/18/2023 1015   CHOLHDL 3 04/18/2020 1418   VLDL 47.4 (H) 04/18/2020 1418   LDLCALC 105 (H) 05/18/2023 1015   LDLCALC 183 (H) 12/08/2018 1531   LDLDIRECT 71.0 04/18/2020 1418    WEIGHTS: Wt Readings from Last 3 Encounters:  07/23/23 236 lb 3.2 oz (107.1 kg)  05/24/23 232 lb (105.2 kg)  04/22/23 232 lb 6.4 oz (105.4 kg)    VITALS: BP (!) 142/66 (BP Location: Left Arm, [name] Position: Sitting, Cuff Size: Normal)   Pulse 68   Ht 5\' 3"  (1.6 m)   Wt 236 lb 3.2 oz (107.1 kg)   SpO2 97%   BMI 41.84 kg/m   EXAM: General appearance: alert and no distress Lungs: clear to auscultation bilaterally Heart: regular rate and rhythm, S1, S2 normal, and systolic murmur: early systolic 2/6, blowing at apex Extremities: extremities normal, atraumatic, no cyanosis or edema Neurologic: Grossly normal  EKG: Deferred  ASSESSMENT: Caseous posterior mitral annular calcification Paroxysmal atrial fibrillation-CHA2DS2-VASc score 4 Mixed dyslipidemia with elevated LDL and triglycerides Coronary artery disease with moderate to severe disease by cath (04/2018)-managed medically Morbid obesity Hypertension Palpitations Hypothyroidism Severe OSA  PLAN: 1.   Destiny [name] is noted to have caseous mitral annular calcification of the posterior leaflet with a large mass measuring about 3 x 3 cm.  There is associated mitral stenosis and regurgitation.  In reviewing the literature since this is quite rare, it does appear that there are risk factors including stroke, arrhythmia, endocarditis and other associated diseases.  I  believe the mitral annulus needs to be better evaluated.  Will try to schedule for TEE however since I am not available next week I will try to schedule with one of my partners as soon as possible.  Based on these findings, she may need CT surgical evaluation by Dr. Leafy Ro.    Informed Consent   Shared Decision Making/Informed Consent   The risks [esophageal damage, perforation (1:10,000 risk), bleeding, pharyngeal hematoma as well as other potential complications associated with conscious sedation including aspiration, arrhythmia, respiratory failure and death], benefits (treatment guidance and diagnostic support) and alternatives of a transesophageal echocardiogram were discussed in detail with Destiny [name] and she is willing to proceed.     Follow-up with me after.  Chrystie Nose, MD, [facility], FACP  [place]  [facility]  Medical Director of the Advanced Lipid Disorders &  Cardiovascular Risk Reduction Clinic Diplomate of the American Board of Clinical Lipidology Attending Cardiologist  Direct Dial: [phone]  Fax: [phone]  Website:  [email or web address] Ure 07/25/2023, 4:01 PM

## 2023-07-25 ENCOUNTER — Encounter (HOSPITAL_BASED_OUTPATIENT_CLINIC_OR_DEPARTMENT_OTHER): Payer: Medicare PPO | Admitting: Cardiology

## 2023-07-26 ENCOUNTER — Telehealth: Payer: Self-pay | Admitting: Internal Medicine

## 2023-07-26 NOTE — Telephone Encounter (Signed)
Spoke with patient. She would like a TEE procedure for later in the day. Advised will call to check on this and get back in touch with her.

## 2023-07-26 NOTE — Telephone Encounter (Signed)
[facility] outpatient scheduling. Moved procedure to 0930 on 08/12/23  Patient aware.

## 2023-07-26 NOTE — Telephone Encounter (Signed)
Patient would like to have 3/06 procedure rescheduled for an earlier time if possible. She states she didn't realize she would not be able to eat.

## 2023-07-27 DIAGNOSIS — I2510 Atherosclerotic heart disease of native coronary artery without angina pectoris: Secondary | ICD-10-CM | POA: Diagnosis not present

## 2023-07-28 ENCOUNTER — Encounter: Payer: Self-pay | Admitting: Cardiovascular Disease

## 2023-07-28 LAB — BASIC METABOLIC PANEL
BUN/Creatinine Ratio: 16 (ref 12–28)
BUN: 15 mg/dL (ref 8–27)
CO2: 21 mmol/L (ref 20–29)
Calcium: 9.6 mg/dL (ref 8.7–10.3)
Chloride: 98 mmol/L (ref 96–106)
Creatinine, Ser: 0.91 mg/dL (ref 0.57–1.00)
Glucose: 134 mg/dL — ABNORMAL HIGH (ref 70–99)
Potassium: 3.9 mmol/L (ref 3.5–5.2)
Sodium: 138 mmol/L (ref 134–144)
eGFR: 68 mL/min/{1.73_m2} (ref >59)

## 2023-07-28 LAB — CBC
Hematocrit: 45.3 % (ref 34.0–46.6)
Hemoglobin: 14.9 g/dL (ref 11.1–15.9)
MCH: 30.2 pg (ref 26.6–33.0)
MCHC: 32.9 g/dL (ref 31.5–35.7)
MCV: 92 fL (ref 79–97)
Platelets: 260 10*3/uL (ref 150–450)
RBC: 4.94 x10E6/uL (ref 3.77–5.28)
RDW: 13.2 % (ref 11.7–15.4)
WBC: 4.7 10*3/uL (ref 3.4–10.8)

## 2023-07-28 LAB — BRAIN NATRIURETIC PEPTIDE: BNP: 31.9 pg/mL (ref 0.0–100.0)

## 2023-07-29 ENCOUNTER — Telehealth: Payer: Medicare PPO | Admitting: Family Medicine

## 2023-08-09 ENCOUNTER — Telehealth: Payer: Self-pay | Admitting: Urology

## 2023-08-09 ENCOUNTER — Other Ambulatory Visit: Payer: Self-pay | Admitting: Urology

## 2023-08-09 DIAGNOSIS — M79662 Pain in left lower leg: Secondary | ICD-10-CM | POA: Diagnosis not present

## 2023-08-09 DIAGNOSIS — M79605 Pain in left leg: Secondary | ICD-10-CM | POA: Diagnosis not present

## 2023-08-09 DIAGNOSIS — M79604 Pain in right leg: Secondary | ICD-10-CM | POA: Diagnosis not present

## 2023-08-09 DIAGNOSIS — N3946 Mixed incontinence: Secondary | ICD-10-CM

## 2023-08-09 DIAGNOSIS — M79661 Pain in right lower leg: Secondary | ICD-10-CM | POA: Diagnosis not present

## 2023-08-09 DIAGNOSIS — I83893 Varicose veins of bilateral lower extremities with other complications: Secondary | ICD-10-CM | POA: Diagnosis not present

## 2023-08-09 MED ORDER — GEMTESA 75 MG PO TABS
75.0000 mg | ORAL_TABLET | Freq: Every day | ORAL | 5 refills | Status: DC
Start: 2023-08-09 — End: 2023-08-09

## 2023-08-09 MED ORDER — GEMTESA 75 MG PO TABS: 75.0000 mg | ORAL_TABLET | Freq: Every day | ORAL | 3 refills | Status: DC

## 2023-08-09 NOTE — Telephone Encounter (Signed)
 Pt has made a follow up appt for 03/26 @ 3:30pm  Needs a refill on Gemtesa. According to the patient, [facility] takes 2 weeks to fill the script. She is requesting one script be sent to [facility] and one be sent to [facility] on [address], [place], [place] [address], to hold her over until the script is filled at [facility].

## 2023-08-10 ENCOUNTER — Encounter: Payer: Self-pay | Admitting: Internal Medicine

## 2023-08-11 NOTE — Progress Notes (Signed)
 Spoke to pt and instructed them to come at 0830 and to be NPO after 0000.   Confirmed that pt will have a ride home and someone to stay with them for 24 hours after the procedure. Instructed patient to not wear any jewelry or lotion.

## 2023-08-12 ENCOUNTER — Encounter (HOSPITAL_COMMUNITY): Payer: Self-pay | Admitting: Internal Medicine

## 2023-08-12 ENCOUNTER — Telehealth: Payer: Self-pay | Admitting: Urology

## 2023-08-12 ENCOUNTER — Ambulatory Visit (HOSPITAL_BASED_OUTPATIENT_CLINIC_OR_DEPARTMENT_OTHER): Payer: Self-pay

## 2023-08-12 ENCOUNTER — Ambulatory Visit (HOSPITAL_COMMUNITY): Payer: Self-pay

## 2023-08-12 ENCOUNTER — Other Ambulatory Visit: Payer: Self-pay

## 2023-08-12 ENCOUNTER — Encounter (HOSPITAL_COMMUNITY)
Admission: RE | Disposition: A | Discharge status: Home / Self Care | Payer: Medicare PPO | Source: Home / Self Care | Attending: Internal Medicine

## 2023-08-12 ENCOUNTER — Ambulatory Visit (HOSPITAL_COMMUNITY)
Admission: RE | Admit: 2023-08-12 | Discharge: 2023-08-12 | Disposition: A | Discharge status: Home / Self Care | Source: Ambulatory Visit | Attending: Internal Medicine | Admitting: Internal Medicine

## 2023-08-12 ENCOUNTER — Ambulatory Visit (HOSPITAL_COMMUNITY)
Admission: RE | Admit: 2023-08-12 | Discharge: 2023-08-12 | Disposition: A | Discharge status: Home / Self Care | Payer: Medicare PPO | Attending: Internal Medicine | Admitting: Internal Medicine

## 2023-08-12 DIAGNOSIS — G4733 Obstructive sleep apnea (adult) (pediatric): Secondary | ICD-10-CM | POA: Diagnosis not present

## 2023-08-12 DIAGNOSIS — I10 Essential (primary) hypertension: Secondary | ICD-10-CM | POA: Diagnosis not present

## 2023-08-12 DIAGNOSIS — Z8249 Family history of ischemic heart disease and other diseases of the circulatory system: Secondary | ICD-10-CM | POA: Diagnosis not present

## 2023-08-12 DIAGNOSIS — I2510 Atherosclerotic heart disease of native coronary artery without angina pectoris: Secondary | ICD-10-CM | POA: Diagnosis not present

## 2023-08-12 DIAGNOSIS — I058 Other rheumatic mitral valve diseases: Secondary | ICD-10-CM

## 2023-08-12 DIAGNOSIS — I3481 Nonrheumatic mitral (valve) annulus calcification: Secondary | ICD-10-CM | POA: Diagnosis not present

## 2023-08-12 DIAGNOSIS — E039 Hypothyroidism, unspecified: Secondary | ICD-10-CM | POA: Insufficient documentation

## 2023-08-12 DIAGNOSIS — Z87891 Personal history of nicotine dependence: Secondary | ICD-10-CM

## 2023-08-12 DIAGNOSIS — Z7901 Long term (current) use of anticoagulants: Secondary | ICD-10-CM | POA: Diagnosis not present

## 2023-08-12 DIAGNOSIS — I480 Paroxysmal atrial fibrillation: Secondary | ICD-10-CM | POA: Diagnosis not present

## 2023-08-12 DIAGNOSIS — E119 Type 2 diabetes mellitus without complications: Secondary | ICD-10-CM | POA: Diagnosis not present

## 2023-08-12 DIAGNOSIS — Z79899 Other long term (current) drug therapy: Secondary | ICD-10-CM | POA: Diagnosis not present

## 2023-08-12 DIAGNOSIS — E782 Mixed hyperlipidemia: Secondary | ICD-10-CM | POA: Diagnosis not present

## 2023-08-12 DIAGNOSIS — Z79890 Hormone replacement therapy (postmenopausal): Secondary | ICD-10-CM | POA: Diagnosis not present

## 2023-08-12 HISTORY — PX: TRANSESOPHAGEAL ECHOCARDIOGRAM (CATH LAB): EP1270

## 2023-08-12 LAB — ECHO TEE

## 2023-08-12 SURGERY — TRANSESOPHAGEAL ECHOCARDIOGRAM (TEE) (CATHLAB)
Anesthesia: Monitor Anesthesia Care

## 2023-08-12 MED ORDER — PROPOFOL 10 MG/ML IV BOLUS
INTRAVENOUS | Status: DC | PRN
  Administered 2023-08-12: 60 mg via INTRAVENOUS
  Administered 2023-08-12: 20 mg via INTRAVENOUS

## 2023-08-12 MED ORDER — SODIUM CHLORIDE 0.9 % IV SOLN: INTRAVENOUS | Status: DC

## 2023-08-12 MED ORDER — PROPOFOL 500 MG/50ML IV EMUL
INTRAVENOUS | Status: DC | PRN
  Administered 2023-08-12: 150 ug/kg/min via INTRAVENOUS
  Administered 2023-08-12: 130 ug/kg/min via INTRAVENOUS

## 2023-08-12 MED ORDER — LIDOCAINE 2% (20 MG/ML) 5 ML SYRINGE
INTRAMUSCULAR | Status: DC | PRN
  Administered 2023-08-12: 80 mg via INTRAVENOUS

## 2023-08-12 NOTE — CV Procedure (Signed)
    TRANSESOPHAGEAL ECHOCARDIOGRAM   NAME:  Destiny [name]    MRN: [id number] DOB:  1950/12/04    ADMIT DATE: 08/12/2023  INDICATIONS: Mitral annular calcification  PROCEDURE:   Informed consent was obtained prior to the procedure. The risks, benefits and alternatives for the procedure were discussed and the patient comprehended these risks.  Risks include, but are not limited to, cough, sore throat, vomiting, nausea, somnolence, esophageal and stomach trauma or perforation, bleeding, low blood pressure, aspiration, pneumonia, infection, trauma to the teeth and death.    Procedural time out performed. The oropharynx was anesthetized with topical 1% benzocaine.    Anesthesia was administered by Dr. Sampson Goon and team.  Patient has prior history of morbid obesity and dysphagia- a small X-11 probe was used an we deferred gastric images for this reason.  The patient's heart rate, blood pressure, and oxygen saturation are monitored continuously during the procedure.    The transesophageal probe was inserted in the esophagus without difficulty and multiple views were obtained.   Due to X11, habitus, and hypoxia zero view gastric images were obtained.  When they were not of high quality, further gastric imaging deferred.  COMPLICATIONS:    There were no immediate complications.  KEY FINDINGS:  Mitral annular calcification without stenosis or hemodynamically significant mitral regurgitation. LASH with no PFO.  Full report to follow. Further management per primary team.   Riley Lam, MD [place]  [facility]  9:46 AM

## 2023-08-12 NOTE — Telephone Encounter (Signed)
 Pt called yesterday about her refill on her Gemtesa needing to be sent to the [facility] in [place]. I called pt back and let her know that this was done on March 3 for a full Rx prescription.

## 2023-08-12 NOTE — Interval H&P Note (Signed)
 History and Physical Interval Note:  08/12/2023 8:49 AM  Destiny [name]  has presented today for surgery, with the diagnosis of CASEOUS MITRAL ANNUALAR   CALCISICAN.  The various methods of treatment have been discussed with the patient and family. After consideration of risks, benefits and other options for treatment, the patient has consented to  Procedure(s): TRANSESOPHAGEAL ECHOCARDIOGRAM (N/A) as a surgical intervention.  The patient's history has been reviewed, patient examined, no change in status, stable for surgery.  I have reviewed the patient's chart and labs.  Questions were answered to the patient's satisfaction.     Arman Loy A Dail Meece

## 2023-08-12 NOTE — Transfer of Care (Signed)
 Immediate Anesthesia Transfer of Care Note  Patient: Destiny [name]  Procedure(s) Performed: TRANSESOPHAGEAL ECHOCARDIOGRAM  Patient Location: Cath Lab  Anesthesia Type:MAC  Level of Consciousness: awake, alert , and oriented  Airway & Oxygen Therapy: Patient Spontanous Breathing and Patient connected to nasal cannula oxygen  Post-op Assessment: Report given to RN, Post -op Vital signs reviewed and stable, and Patient moving all extremities X 4  Post vital signs: Reviewed and stable  Last Vitals:  Vitals Value Taken Time  BP 118/100 08/12/23 0943  Temp    Pulse 66 08/12/23 0945  Resp 16 08/12/23 0945  SpO2 96 % 08/12/23 0945  Vitals shown include unfiled device data.  Last Pain:  Vitals:   08/12/23 0836  TempSrc: Temporal         Complications: No notable events documented.

## 2023-08-12 NOTE — Anesthesia Preprocedure Evaluation (Signed)
 Anesthesia Evaluation  Patient identified by MRN, date of birth, ID band Patient awake    Reviewed: Allergy & Precautions, H&P , NPO status , Patient's Chart, lab work & pertinent test results, reviewed documented beta blocker date and time   Airway Mallampati: III  TM Distance: >3 FB Neck ROM: Full    Dental no notable dental hx. (+) Teeth Intact, Dental Advisory Given   Pulmonary sleep apnea , former smoker   Pulmonary exam normal breath sounds clear to auscultation       Cardiovascular hypertension, Pt. on medications and Pt. on home beta blockers + CAD   Rhythm:Regular Rate:Normal     Neuro/Psych negative neurological ROS  negative psych ROS   GI/Hepatic Neg liver ROS, hiatal hernia,GERD  Medicated,,  Endo/Other  Hypothyroidism  Class 3 obesity  Renal/GU negative Renal ROS  negative genitourinary   Musculoskeletal  (+) Arthritis , Osteoarthritis,    Abdominal   Peds  Hematology negative hematology ROS (+)   Anesthesia Other Findings   Reproductive/Obstetrics negative OB ROS                             Anesthesia Physical Anesthesia Plan  ASA: 3  Anesthesia Plan: MAC   Post-op Pain Management: Minimal or no pain anticipated   Induction: Intravenous  PONV Risk Score and Plan: 2 and Propofol infusion and Treatment may vary due to age or medical condition  Airway Management Planned: Natural Airway and Simple Face Mask  Additional Equipment:   Intra-op Plan:   Post-operative Plan:   Informed Consent: I have reviewed the patients History and Physical, chart, labs and discussed the procedure including the risks, benefits and alternatives for the proposed anesthesia with the patient or authorized representative who has indicated his/her understanding and acceptance.     Dental advisory given  Plan Discussed with: CRNA  Anesthesia Plan Comments:        Anesthesia  Quick Evaluation

## 2023-08-12 NOTE — Anesthesia Postprocedure Evaluation (Signed)
 Anesthesia Post Note  Patient: Destiny [name]  Procedure(s) Performed: TRANSESOPHAGEAL ECHOCARDIOGRAM     Patient location during evaluation: Cath Lab Anesthesia Type: MAC Level of consciousness: awake and alert Pain management: pain level controlled Vital Signs Assessment: post-procedure vital signs reviewed and stable Respiratory status: spontaneous breathing, nonlabored ventilation and respiratory function stable Cardiovascular status: stable and blood pressure returned to baseline Postop Assessment: no apparent nausea or vomiting Anesthetic complications: no  No notable events documented.  Last Vitals:  Vitals:   08/12/23 0901 08/12/23 0945  BP:  (!) 118/100  Pulse: 63 67  Resp:  20  Temp:  36.5 C  SpO2:  96%    Last Pain:  Vitals:   08/12/23 0945  TempSrc: Temporal  PainSc: 0-No pain                 Brent Noto,W. EDMOND

## 2023-08-18 DIAGNOSIS — E039 Hypothyroidism, unspecified: Secondary | ICD-10-CM | POA: Diagnosis not present

## 2023-08-24 ENCOUNTER — Encounter: Payer: Self-pay | Admitting: Internal Medicine

## 2023-08-24 DIAGNOSIS — I87092 Postthrombotic syndrome with other complications of left lower extremity: Secondary | ICD-10-CM | POA: Diagnosis not present

## 2023-08-24 DIAGNOSIS — E66813 Obesity, class 3: Secondary | ICD-10-CM

## 2023-08-24 DIAGNOSIS — I2510 Atherosclerotic heart disease of native coronary artery without angina pectoris: Secondary | ICD-10-CM

## 2023-08-24 DIAGNOSIS — I83892 Varicose veins of left lower extremities with other complications: Secondary | ICD-10-CM | POA: Diagnosis not present

## 2023-08-25 ENCOUNTER — Encounter (HOSPITAL_BASED_OUTPATIENT_CLINIC_OR_DEPARTMENT_OTHER): Payer: Medicare PPO | Admitting: Cardiology

## 2023-08-26 NOTE — Telephone Encounter (Signed)
 MD sent MyChart message to patient

## 2023-08-31 ENCOUNTER — Encounter: Payer: Self-pay | Admitting: Family Medicine

## 2023-09-01 ENCOUNTER — Ambulatory Visit (INDEPENDENT_AMBULATORY_CARE_PROVIDER_SITE_OTHER): Admitting: Urology

## 2023-09-01 ENCOUNTER — Encounter: Payer: Self-pay | Admitting: Urology

## 2023-09-01 VITALS — BP 121/67 | HR 79 | Ht 63.0 in | Wt 230.0 lb

## 2023-09-01 DIAGNOSIS — N3946 Mixed incontinence: Secondary | ICD-10-CM

## 2023-09-01 LAB — URINALYSIS, ROUTINE W REFLEX MICROSCOPIC
Bilirubin, UA: NEGATIVE
Glucose, UA: NEGATIVE
Ketones, UA: NEGATIVE
Leukocytes,UA: NEGATIVE
Nitrite, UA: NEGATIVE
Protein,UA: NEGATIVE
Specific Gravity, UA: 1.015 (ref 1.005–1.030)
Urobilinogen, Ur: 0.2 mg/dL (ref 0.2–1.0)
pH, UA: 5 (ref 5.0–7.5)

## 2023-09-01 LAB — MICROSCOPIC EXAMINATION: Bacteria, UA: NONE SEEN

## 2023-09-01 NOTE — Progress Notes (Signed)
 Assessment: 1. Mixed incontinence urge and stress     Plan: Continue Gemtesa. I again discussed other options for management of her mixed incontinence including alternative medications, PTNS, sacral neuro modulation, and chemodenervation with botulinum toxin. She remains interested in pursuing PTNS.  We will obtain insurance approval for PTNS and contact her to schedule this.  Chief Complaint: Chief Complaint  Patient presents with   Urinary Incontinence    HPI: Destiny [name] is a 73 y.o. female who presents for continued evaluation of urinary incontinence.   At her initial visit in 10/22, she reported symptoms for approximately 7 years with gradual worsening.  She reported urinary frequency, voiding 3-4 times per hour, urgency, nocturia 1-2 times, urge incontinence and occasional unaware incontinence.  She reported some leakage with stress incontinence as well.  She also reported a decreased force of stream.  She had tried Kegel's in the past.  She was not using any pads.  No dysuria or gross hematuria.  No recent UTIs.  No prior medical therapy. PVR = 115 mL She was given a trial of Myrbetriq 50 mg daily. At her visit in November 2022, she reported no change in her symptoms with the Myrbetriq.  She continued to have urgency, frequency, and urge incontinence.  No dysuria or gross hematuria.   She was subsequently changed to Gemtesa 75 mg daily.  At her visit in August 2024, she continued on [place].  She reported some improvement in her urinary symptoms with decreased frequency, urgency, and decreased incontinence.  However, she continued to have some episodes of incontinence without awareness.  She was interested in pursuing PTNS.  This was approved through her insurance company but she did not follow-up with scheduling this.  She returns today for follow-up.  She continues on [place].  She continues to report symptoms of frequency, urgency, and episodes of incontinence with urgency  and without awareness.  She is using incontinence pads.  No dysuria or gross hematuria.  Portions of the above documentation were copied from a prior visit for review purposes only.  Allergies: Allergies  Allergen Reactions   Diclofenac Itching, Nausea Only and Other (See Comments)    Wheezing   Hydrochlorothiazide     Wheezing. Losing teeth.   Mirtazapine     Unknown reaction    Statins     myalgia    PMH: Past Medical History:  Diagnosis Date   Abnormal blood chemistry    Arthropathy    Back pain    Breast cyst    Cellulitis    Chest discomfort    Chronic bronchitis (HCC)    Constipation    Disturbance of skin sensation    Edema    Encounter for screening and preventative care 06/17/2016   Fatty liver    GERD (gastroesophageal reflux disease)    Hiatal hernia with gastroesophageal reflux 06/17/2016   High risk medication use    Hoarseness 06/15/2016   Hyperglycemia    Hyperglycemia 12/21/2016   Hyperlipidemia    Hyperlipidemia, mixed 06/15/2016   Hypertension    Hypopotassemia    Hypothyroidism    Joint pain    Menopause    Obesity    Palpitations    Positive ANA (antinuclear antibody)    Pre-diabetes    Shoulder pain, right    Skin cancer    Leg   Sleep apnea    denies   SOB (shortness of breath)    Thyroid disease 12/11/2014   Thyroid nodule  Urinary incontinence 04/01/2017   Vitamin D deficiency    Vitamin D deficiency     PSH: Past Surgical History:  Procedure Laterality Date   BREAST CYST ASPIRATION Left    30 years ago  1970's or 1980's   CORONARY PRESSURE/FFR STUDY N/A 04/25/2018   Procedure: INTRAVASCULAR PRESSURE WIRE/FFR STUDY;  Surgeon: Corky Crafts, MD;  Location: [facility] INVASIVE CV LAB;  Service: Cardiovascular;  Laterality: N/A;   LEFT HEART CATH AND CORONARY ANGIOGRAPHY N/A 04/25/2018   Procedure: LEFT HEART CATH AND CORONARY ANGIOGRAPHY;  Surgeon: Corky Crafts, MD;  Location: [facility] INVASIVE CV LAB;  Service: Cardiovascular;   Laterality: N/A;   LIPOSUCTION  1990   over gluteal area    TRANSESOPHAGEAL ECHOCARDIOGRAM (CATH LAB) N/A 08/12/2023   Procedure: TRANSESOPHAGEAL ECHOCARDIOGRAM;  Surgeon: Christell Constant, MD;  Location: [facility] INVASIVE CV LAB;  Service: Cardiovascular;  Laterality: N/A;   VEIN SURGERY Left 2022   WISDOM TOOTH EXTRACTION     and all upper teeth    SH: Social History   Tobacco Use   Smoking status: Former    Current packs/day: 0.00    Average packs/day: 1 pack/day for 27.0 years (27.0 ttl pk-yrs)    Types: Cigarettes    Start date: 1    Quit date: 2011    Years since quitting: 14.2   Smokeless tobacco: Never  Vaping Use   Vaping status: Never Used  Substance Use Topics   Alcohol use: Yes    Alcohol/week: 0.0 standard drinks of alcohol   Drug use: No    ROS: Constitutional:  Negative for fever, chills, weight loss CV: Negative for chest pain, previous MI, hypertension Respiratory:  Negative for shortness of breath, wheezing, sleep apnea, frequent cough GI:  Negative for nausea, vomiting, bloody stool, GERD  PE: BP 121/67   Pulse 79   Ht 5\' 3"  (1.6 m)   Wt 230 lb (104.3 kg)   BMI 40.74 kg/m  GENERAL APPEARANCE:  Well appearing, well developed, well nourished, NAD HEENT:  Atraumatic, normocephalic, oropharynx clear NECK:  Supple without lymphadenopathy or thyromegaly ABDOMEN:  Soft, non-tender, no masses EXTREMITIES:  Moves all extremities well, without clubbing, cyanosis, or edema NEUROLOGIC:  Alert and oriented x 3, normal gait, CN II-XII grossly intact MENTAL STATUS:  appropriate BACK:  Non-tender to palpation, No CVAT SKIN:  Warm, dry, and intact   Results: U/A: 0-5 WBCs, 0-2 RBCs

## 2023-09-17 ENCOUNTER — Telehealth: Payer: Self-pay | Admitting: Urology

## 2023-09-17 ENCOUNTER — Encounter: Payer: Self-pay | Admitting: Urology

## 2023-09-17 NOTE — Telephone Encounter (Signed)
 Pt called stated she tried to msg you back she wanted to let you know that [facility] approval number is [id number].

## 2023-09-19 NOTE — Assessment & Plan Note (Signed)
 Well controlled, no changes to meds. Encouraged heart healthy diet such as the DASH diet and exercise as tolerated.

## 2023-09-19 NOTE — Assessment & Plan Note (Signed)
 Patient encouraged to maintain heart healthy diet, regular exercise, adequate sleep. Consider daily probiotics. Take medications as prescribed. Labs ordered and reviewed  RSV (respiratory syncitial virus) vaccine at pharmacy, Arexvy Colonoscopy 2016 repeat in 2026 Mgm 08/2020 ordered today pap12/22 repeat in 3-5 years  Dexa 06/2020 repeat in 2024 or 2025

## 2023-09-19 NOTE — Assessment & Plan Note (Signed)
 Supplement and monitor

## 2023-09-19 NOTE — Assessment & Plan Note (Signed)
 [id number] acceptable, minimize simple carbs. Increase exercise as tolerated.

## 2023-09-19 NOTE — Assessment & Plan Note (Signed)
On Armour Thyroid, continue to monitor  

## 2023-09-20 ENCOUNTER — Ambulatory Visit (INDEPENDENT_AMBULATORY_CARE_PROVIDER_SITE_OTHER): Admitting: Family Medicine

## 2023-09-20 ENCOUNTER — Encounter: Payer: Self-pay | Admitting: Family Medicine

## 2023-09-20 VITALS — BP 144/78 | HR 71 | Temp 98.1°F | Resp 16 | Ht 63.0 in | Wt 237.6 lb

## 2023-09-20 DIAGNOSIS — I10 Essential (primary) hypertension: Secondary | ICD-10-CM | POA: Diagnosis not present

## 2023-09-20 DIAGNOSIS — Z0000 Encounter for general adult medical examination without abnormal findings: Secondary | ICD-10-CM

## 2023-09-20 DIAGNOSIS — E039 Hypothyroidism, unspecified: Secondary | ICD-10-CM | POA: Diagnosis not present

## 2023-09-20 DIAGNOSIS — E2839 Other primary ovarian failure: Secondary | ICD-10-CM | POA: Diagnosis not present

## 2023-09-20 DIAGNOSIS — E559 Vitamin D deficiency, unspecified: Secondary | ICD-10-CM

## 2023-09-20 DIAGNOSIS — R739 Hyperglycemia, unspecified: Secondary | ICD-10-CM

## 2023-09-20 DIAGNOSIS — Z780 Asymptomatic menopausal state: Secondary | ICD-10-CM

## 2023-09-20 DIAGNOSIS — M25561 Pain in right knee: Secondary | ICD-10-CM | POA: Diagnosis not present

## 2023-09-20 DIAGNOSIS — E538 Deficiency of other specified B group vitamins: Secondary | ICD-10-CM

## 2023-09-20 DIAGNOSIS — Z87891 Personal history of nicotine dependence: Secondary | ICD-10-CM

## 2023-09-20 DIAGNOSIS — Z1231 Encounter for screening mammogram for malignant neoplasm of breast: Secondary | ICD-10-CM

## 2023-09-20 DIAGNOSIS — R252 Cramp and spasm: Secondary | ICD-10-CM | POA: Diagnosis not present

## 2023-09-20 DIAGNOSIS — G8929 Other chronic pain: Secondary | ICD-10-CM

## 2023-09-20 NOTE — Progress Notes (Signed)
 Subjective:    Patient ID: Destiny [name], female    DOB: 01-Apr-1951, 73 y.o.   MRN: [id number]  Chief Complaint  Patient presents with   Annual Exam    HPI Discussed the use of AI scribe software for clinical note transcription with the patient, who gave verbal consent to proceed.  History of Present Illness Destiny [name] "Jan" is a 73 year old female who presents with tingling in her hands and knee pain.  She experiences tingling in her hands that occurs randomly without a specific trigger, often starting while sitting and relieved by movement. She suspects her diet, which includes flour, sugar, and occasional sodas, might contribute to her symptoms and has started a detox diet to eliminate these foods. She mentions a potential issue with nerve compression in her neck, as she has been informed about problems in that area. She is undergoing massage therapy and cupping, which she finds beneficial.  She has significant pain in her right knee, which has worsened recently, possibly due to weather changes. She notes a bump on one side of her knee, which she attributes to a past fall. The pain is present even when not weight-bearing, and she is considering further evaluation to prevent worsening. She is unsure whether to pursue orthopedic or sports medicine consultation.  She experiences shortness of breath with activity, which she initially attributed to her heart but now considers might be due to decreased physical activity. She has a history of smoking, having quit 11 years ago after smoking up to a pack a day since her twenties. She is considering a low-dose lung cancer screening due to her smoking history.  She is concerned about her blood pressure, which she monitors at home. Her readings are often around 144/82. She uses Himalayan salt but acknowledges the need to cut back on salt consumption.  She reports difficulty sleeping and is exploring non-medication options to improve her sleep  quality. She has been taking magnesium, which she finds helpful, and is considering L-tryptophan. She also uses a fan for white noise and is interested in red light therapy to aid sleep.  She mentions occasional muscle cramps in her legs, which she associates with her veins and magnesium levels. She is taking magnesium supplements and is considering checking her magnesium levels to ensure they are within a normal range.    Past Medical History:  Diagnosis Date   Abnormal blood chemistry    Arthropathy    Back pain    Breast cyst    Cellulitis    Chest discomfort    Chronic bronchitis (HCC)    Constipation    Disturbance of skin sensation    Edema    Encounter for screening and preventative care 06/17/2016   Fatty liver    GERD (gastroesophageal reflux disease)    Hiatal hernia with gastroesophageal reflux 06/17/2016   High risk medication use    Hoarseness 06/15/2016   Hyperglycemia    Hyperglycemia 12/21/2016   Hyperlipidemia    Hyperlipidemia, mixed 06/15/2016   Hypertension    Hypopotassemia    Hypothyroidism    Joint pain    Menopause    Obesity    Palpitations    Positive ANA (antinuclear antibody)    Pre-diabetes    Shoulder pain, right    Skin cancer    Leg   Sleep apnea    denies   SOB (shortness of breath)    Thyroid disease 12/11/2014   Thyroid nodule  Urinary incontinence 04/01/2017   Vitamin D deficiency    Vitamin D deficiency     Past Surgical History:  Procedure Laterality Date   BREAST CYST ASPIRATION Left    30 years ago  1970's or 1980's   CORONARY PRESSURE/FFR STUDY N/A 04/25/2018   Procedure: INTRAVASCULAR PRESSURE WIRE/FFR STUDY;  Surgeon: Lucendia Rusk, MD;  Location: [facility] INVASIVE CV LAB;  Service: Cardiovascular;  Laterality: N/A;   LEFT HEART CATH AND CORONARY ANGIOGRAPHY N/A 04/25/2018   Procedure: LEFT HEART CATH AND CORONARY ANGIOGRAPHY;  Surgeon: Lucendia Rusk, MD;  Location[facility] INVASIVE CV LAB;  Service: Cardiovascular;   Laterality: N/A;   LIPOSUCTION  1990   over gluteal area    TRANSESOPHAGEAL ECHOCARDIOGRAM (CATH LAB) N/A 08/12/2023   Procedure: TRANSESOPHAGEAL ECHOCARDIOGRAM;  Surgeon: Jann Melody, MD;  Location: [facility] INVASIVE CV LAB;  Service: Cardiovascular;  Laterality: N/A;   VEIN SURGERY Left 2022   WISDOM TOOTH EXTRACTION     and all upper teeth    Family History  Problem Relation Age of Onset   Irregular heart beat Mother    Heart disease Mother    Rheumatic fever Mother    Hypertension Father    Ulcers Father    Hyperlipidemia Father    Stroke Maternal Grandmother    Diabetes Maternal Grandmother    Hypertension Paternal Grandmother    Ulcers Paternal Grandfather     Social History   Socioeconomic History   Marital status: Married    Spouse name: Jhania Etherington   Number of children: Not on file   Years of education: Not on file   Highest education level: Not on file  Occupational History   Occupation: homemaker  Tobacco Use   Smoking status: Former    Current packs/day: 0.00    Average packs/day: 1 pack/day for 27.0 years (27.0 ttl pk-yrs)    Types: Cigarettes    Start date: 49    Quit date: 2011    Years since quitting: 14.2   Smokeless tobacco: Never  Vaping Use   Vaping status: Never Used  Substance and Sexual Activity   Alcohol use: Yes    Alcohol/week: 0.0 standard drinks of alcohol   Drug use: No   Sexual activity: Not Currently    Birth control/protection: Post-menopausal    Comment: menopause around age [age]  Other Topics Concern   Not on file  Social History Narrative   Occasional alcohol use: hard liquor, wine and beer   Social Drivers of Health   Financial Resource Strain: Low Risk  (04/22/2022)   Overall Financial Resource Strain (CARDIA)    Difficulty of Paying Living Expenses: Not hard at all  Food Insecurity: Low Risk  (08/18/2023)   Received from [facility]   Hunger Vital Sign    Worried About Running Out of Food in the Last Year:  Never true    Ran Out of Food in the Last Year: Never true  Transportation Needs: No Transportation Needs (08/18/2023)   Received from [facility]    In the past 12 months, has lack of reliable transportation kept you from medical appointments, meetings, work or from getting things needed for daily living? : No  Physical Activity: Sufficiently Active (04/22/2022)   Exercise Vital Sign    Days of Exercise per Week: 3 days    Minutes of Exercise per Session: 90 min  Stress: No Stress Concern Present (04/22/2022)   [employer] of Occupational Health - Occupational Stress  Questionnaire    Feeling of Stress : Not at all  Social Connections: Socially Integrated (04/22/2022)   Social Connection and Isolation Panel [NHANES]    Frequency of Communication with Friends and Family: More than three times a week    Frequency of Social Gatherings with Friends and Family: More than three times a week    Attends Religious Services: More than 4 times per year    Active Member of [employer] or Organizations: Yes    Attends Engineer, structural: More than 4 times per year    Marital Status: Married  Catering manager Violence: Not At Risk (04/22/2022)   Humiliation, Afraid, Rape, and Kick questionnaire    Fear of Current or Ex-Partner: No    Emotionally Abused: No    Physically Abused: No    Sexually Abused: No    Outpatient Medications Prior to Visit  Medication Sig Dispense Refill   albuterol (VENTOLIN HFA) 108 (90 Base) MCG/ACT inhaler Inhale 2 puffs into the lungs every 6 (six) hours as needed for wheezing or shortness of breath. 18 g 5   apixaban (ELIQUIS) 5 MG TABS tablet Take 1 tablet (5 mg total) by mouth 2 (two) times daily. 180 tablet 3   Coenzyme Q10 (COQ-10 PO) Take 1 tablet by mouth daily.     Evolocumab (REPATHA SURECLICK) 140 MG/ML SOAJ Inject 140 mg into the skin every 14 (fourteen) days. 6 mL 2   furosemide (LASIX) 40 MG tablet Take 1 tablet (40 mg total) by  mouth daily. prn weight gain>3#/24 hours, SOB (Patient taking differently: Take 80 mg by mouth daily.) 30 tablet 0   irbesartan (AVAPRO) 150 MG tablet Take 1 tablet (150 mg total) by mouth daily. 90 tablet 1   metoprolol succinate (TOPROL-XL) 25 MG 24 hr tablet Take 1 tablet (25 mg total) by mouth daily. 90 tablet 1   nitroGLYCERIN (NITROSTAT) 0.4 MG SL tablet Place 1 tablet (0.4 mg total) under the tongue every 5 (five) minutes as needed for chest pain. 25 tablet 1   OVER THE COUNTER MEDICATION Take 1 tablet by mouth daily. Nutrifi vinali supplement     OVER THE COUNTER MEDICATION Take 3 tablets by mouth daily. Nutrifi rejuveniix supplement     OVER THE COUNTER MEDICATION Take 2 tablets by mouth daily. Nutrifi optimal-m     OVER THE COUNTER MEDICATION Take 2 tablets by mouth daily. Nutrifi optimal-v     thyroid (ARMOUR THYROID) 15 MG tablet Take 1 tablet (15 mg total) by mouth daily. Take with 120mg  90 tablet 1   thyroid (ARMOUR) 120 MG tablet Take 120 mg by mouth daily before breakfast. Take with 15 mg to equal 135 mg     Vibegron (GEMTESA) 75 MG TABS Take 1 tablet (75 mg total) by mouth daily. 90 tablet 3   Vitamin D, Ergocalciferol, (DRISDOL) 1.25 MG (50000 UNIT) CAPS capsule TAKE ONE CAPSULE BY MOUTH EVERY SEVEN DAYS 12 capsule 3   acetaminophen (TYLENOL) 500 MG tablet Take 500-1,000 mg by mouth daily as needed for moderate pain (pain score 4-6). (Patient not taking: Reported on 09/20/2023)     CALCIUM-MAGNESIUM-VITAMIN D PO Take 2 tablets by mouth daily. (Patient not taking: Reported on 09/20/2023)     No facility-administered medications prior to visit.    Allergies  Allergen Reactions   Diclofenac Itching, Nausea Only and Other (See Comments)    Wheezing   Hydrochlorothiazide     Wheezing. Losing teeth.   Mirtazapine     Unknown  reaction    Statins     myalgia    Review of Systems  Constitutional:  Negative for chills, fever and malaise/fatigue.  HENT:  Positive for congestion.  Negative for hearing loss.   Eyes:  Negative for discharge.  Respiratory:  Positive for cough, sputum production and shortness of breath.   Cardiovascular:  Negative for chest pain, palpitations and leg swelling.  Gastrointestinal:  Negative for abdominal pain, blood in stool, constipation, diarrhea, heartburn, nausea and vomiting.  Genitourinary:  Negative for dysuria, frequency, hematuria and urgency.  Musculoskeletal:  Positive for back pain, joint pain and myalgias. Negative for falls.  Skin:  Negative for rash.  Neurological:  Negative for dizziness, sensory change, loss of consciousness, weakness and headaches.  Endo/Heme/Allergies:  Negative for environmental allergies. Does not bruise/bleed easily.  Psychiatric/Behavioral:  Negative for depression and suicidal ideas. The patient is not nervous/anxious and does not have insomnia.        Objective:    Physical Exam Constitutional:      General: She is not in acute distress.    Appearance: Normal appearance. She is well-developed. She is not toxic-appearing.  HENT:     Head: Normocephalic and atraumatic.     Right Ear: External ear normal.     Left Ear: External ear normal.     Nose: Nose normal.  Eyes:     General:        Right eye: No discharge.        Left eye: No discharge.     Conjunctiva/sclera: Conjunctivae normal.  Neck:     Thyroid: No thyromegaly.  Cardiovascular:     Rate and Rhythm: Normal rate and regular rhythm.     Heart sounds: Normal heart sounds. No murmur heard. Pulmonary:     Effort: Pulmonary effort is normal. No respiratory distress.     Breath sounds: Normal breath sounds.  Abdominal:     General: Bowel sounds are normal.     Palpations: Abdomen is soft.     Tenderness: There is no abdominal tenderness. There is no guarding.  Musculoskeletal:        General: Normal range of motion.     Cervical back: Neck supple.  Lymphadenopathy:     Cervical: No cervical adenopathy.  Skin:    General: Skin  is warm and dry.  Neurological:     Mental Status: She is alert and oriented to person, place, and time.  Psychiatric:        Mood and Affect: Mood normal.        Behavior: Behavior normal.        Thought Content: Thought content normal.        Judgment: Judgment normal.     BP (!) 144/78   Pulse 71   Temp 98.1 F (36.7 C) (Oral)   Resp 16   Ht 5\' 3"  (1.6 m)   Wt 237 lb 9.6 oz (107.8 kg)   SpO2 97%   BMI 42.09 kg/m  Wt Readings from Last 3 Encounters:  09/20/23 237 lb 9.6 oz (107.8 kg)  09/01/23 230 lb (104.3 kg)  08/12/23 223 lb (101.2 kg)    Diabetic Foot Exam - Simple   No data filed    Lab Results  Component Value Date   WBC 4.7 07/27/2023   HGB 14.9 07/27/2023   HCT 45.3 07/27/2023   PLT 260 07/27/2023   GLUCOSE 134 (H) 07/27/2023   CHOL 184 05/18/2023   TRIG 215 (H) 05/18/2023  HDL 42 05/18/2023   LDLDIRECT 71.0 04/18/2020   LDLCALC 105 (H) 05/18/2023   ALT 27 05/18/2023   AST 22 05/18/2023   NA 138 07/27/2023   K 3.9 07/27/2023   CL 98 07/27/2023   CREATININE 0.91 07/27/2023   BUN 15 07/27/2023   CO2 21 07/27/2023   TSH 8.91 (H) 04/22/2023   HGBA1C 5.9 04/22/2023    Lab Results  Component Value Date   TSH 8.91 (H) 04/22/2023   Lab Results  Component Value Date   WBC 4.7 07/27/2023   HGB 14.9 07/27/2023   HCT 45.3 07/27/2023   MCV 92 07/27/2023   PLT 260 07/27/2023   Lab Results  Component Value Date   NA 138 07/27/2023   K 3.9 07/27/2023   CO2 21 07/27/2023   GLUCOSE 134 (H) 07/27/2023   BUN 15 07/27/2023   CREATININE 0.91 07/27/2023   BILITOT 0.3 05/18/2023   ALKPHOS 82 05/18/2023   AST 22 05/18/2023   ALT 27 05/18/2023   PROT 7.1 05/18/2023   ALBUMIN 4.3 05/18/2023   CALCIUM 9.6 07/27/2023   ANIONGAP 10 04/02/2023   EGFR 68 07/27/2023   GFR 73.46 04/22/2023   Lab Results  Component Value Date   CHOL 184 05/18/2023   Lab Results  Component Value Date   HDL 42 05/18/2023   Lab Results  Component Value Date    LDLCALC 105 (H) 05/18/2023   Lab Results  Component Value Date   TRIG 215 (H) 05/18/2023   Lab Results  Component Value Date   CHOLHDL 4.4 05/18/2023   Lab Results  Component Value Date   HGBA1C 5.9 04/22/2023       Assessment & Plan:  Essential hypertension Assessment & Plan: Well controlled, no changes to meds. Encouraged heart healthy diet such as the DASH diet and exercise as tolerated.    Orders: -     CBC with Differential/Platelet -     Comprehensive metabolic panel with GFR  Hyperglycemia Assessment & Plan: hgba1c acceptable, minimize simple carbs. Increase exercise as tolerated.   Orders: -     Hemoglobin A1c  Hypothyroidism, unspecified type Assessment & Plan: On Armour Thyroid, continue to monitor    Vitamin B12 deficiency Assessment & Plan: Supplement and monitor    Vitamin D deficiency Assessment & Plan: Supplement and monitor   Orders: -     VITAMIN D 25 Hydroxy (Vit-D Deficiency, Fractures)  Preventative health care Assessment & Plan: Patient encouraged to maintain heart healthy diet, regular exercise, adequate sleep. Consider daily probiotics. Take medications as prescribed. Labs ordered and reviewed  RSV (respiratory syncitial virus) vaccine at pharmacy, Arexvy Colonoscopy 2016 repeat in 2026 Mgm 08/2020 ordered today pap12/22 repeat in 3-5 years  Dexa 06/2020 repeat in 2024 or 2025    Breast cancer screening by mammogram -     3D Screening Mammogram, Left and Right; Future  Chronic pain of right knee -     Ambulatory referral to Sports Medicine  H/O tobacco use, presenting hazards to health -     Ambulatory Referral for Lung Cancer Scre  Estrogen deficiency -     DG Bone Density; Future  Post-menopausal -     DG Bone Density; Future  Muscle cramp -     Magnesium    Assessment and Plan Assessment & Plan Paresthesia of hands Intermittent tingling possibly influenced by diet or cervical spine nerve compression. -  Recommend dietary changes to reduce flour and sugar intake. - Consider imaging of  the cervical spine if symptoms persist. - Encourage massage therapy and cupping for symptom relief.  Knee pain Chronic knee pain, right knee more affected, possible meniscal damage or structural issues. - Refer to sports medicine for evaluation and management, including potential imaging and physical therapy. - Consider orthopedic consultation if surgical intervention is deemed necessary by sports medicine.  Shortness of breath Exertional shortness of breath possibly due to deconditioning or past smoking history. - Consider earlier consultation with Doctor Hilty if symptoms worsen.  Hypertension Blood pressure higher than desired, goal to maintain systolic pressure below 140 mmHg. - Check blood pressure weekly at home and report resting numbers. - Encourage reduction of sodium intake and adequate hydration.  Sleep disturbance Difficulty sleeping, magnesium supplementation helps, further interventions needed. - Recommend L-tryptophan and magnesium glycinate for sleep improvement. - Suggest using a Helight device to mimic sunset and promote melatonin production.  Muscle cramps Intermittent cramps possibly related to venous issues or magnesium levels. - Check magnesium levels to ensure appropriate supplementation.  Lung cancer screening Eligible for low-dose CT screening due to past smoking history. - Refer to pulmonary team for low-dose CT lung cancer screening.  General Health Maintenance Routine health maintenance and screenings discussed. - Order mammogram and bone density scan. - Discuss calcium intake and recommend calcium citrate supplementation if needed. - Schedule colonoscopy for next year. - Encourage regular dental visits and physical activity.  Goals of Care Discussed advance directives and importance of healthcare power of attorney. - Provide her with advance directive packet from the  Secretary of State of [place] . - Encourage completion and submission of advance directives for inclusion in medical records.  Follow-up Follow-up plans discussed for ongoing management and monitoring. - Schedule follow-up appointment in six months. - Arrange for annual physical examination. - Ensure lab work is completed, including hemoglobin A1c and magnesium levels.     Randie Bustle, MD

## 2023-09-20 NOTE — Patient Instructions (Addendum)
 Sports medicine referral placed if no news in roughly 2 weeks let us know L Tryptophan for sleep  Magnesium Glycinate 200-400 mg at bedtime  Helight for red light for sleep   BP check weekly with pulse and send [place] resting numbers 100-140/60-90  Bone density shows osteopenia, which is thinner than normal but not as bad as osteoporosis. Recommend calcium intake of 1200 to 1500 mg daily, divided into roughly 3 doses. Lince source is the diet and a single dairy serving is about 500 mg, a supplement of calcium citrate once or twice daily to balance diet is fine if not getting enough in diet. Also need Vitamin D 2000 IU caps, 1 cap daily if not already taking vitamin D. Also recommend weight baring exercise on hips and upper body to keep bones strong    Preventive Care 65 Years and Older, Female Preventive care refers to lifestyle choices and visits with your health care provider that can promote health and wellness. Preventive care visits are also called wellness exams. What can I expect for my preventive care visit? Counseling Your health care provider may ask you questions about your: Medical history, including: Past medical problems. Family medical history. Pregnancy and menstrual history. History of falls. Current health, including: Memory and ability to understand (cognition). Emotional well-being. Home life and relationship well-being. Sexual activity and sexual health. Lifestyle, including: Alcohol, nicotine or tobacco, and drug use. Access to firearms. Diet, exercise, and sleep habits. Work and work Astronomer. Sunscreen use. Safety issues such as seatbelt and bike helmet use. Physical exam Your health care provider will check your: Height and weight. These may be used to calculate your BMI (body mass index). BMI is a measurement that tells if you are at a healthy weight. Waist circumference. This measures the distance around your waistline. This measurement also tells if you  are at a healthy weight and may help predict your risk of certain diseases, such as type 2 diabetes and high blood pressure. Heart rate and blood pressure. Body temperature. Skin for abnormal spots. What immunizations do I need?  Vaccines are usually given at various ages, according to a schedule. Your health care provider will recommend vaccines for you based on your age, medical history, and lifestyle or other factors, such as travel or where you work. What tests do I need? Screening Your health care provider may recommend screening tests for certain conditions. This may include: Lipid and cholesterol levels. Hepatitis C test. Hepatitis B test. HIV (human immunodeficiency virus) test. STI (sexually transmitted infection) testing, if you are at risk. Lung cancer screening. Colorectal cancer screening. Diabetes screening. This is done by checking your blood sugar (glucose) after you have not eaten for a while (fasting). Mammogram. Talk with your health care provider about how often you should have regular mammograms. BRCA-related cancer screening. This may be done if you have a family history of breast, ovarian, tubal, or peritoneal cancers. Bone density scan. This is done to screen for osteoporosis. Talk with your health care provider about your test results, treatment options, and if necessary, the need for more tests. Follow these instructions at home: Eating and drinking  Eat a diet that includes fresh fruits and vegetables, whole grains, lean protein, and low-fat dairy products. Limit your intake of foods with high amounts of sugar, saturated fats, and salt. Take vitamin and mineral supplements as recommended by your health care provider. Do not drink alcohol if your health care provider tells you not to drink. If you drink  alcohol: Limit how much you have to 0-1 drink a day. Know how much alcohol is in your drink. In the [place] one drink equals one 12 oz bottle of beer (355 mL),  one 5 oz glass of wine (148 mL), or one 1 oz glass of hard liquor (44 mL). Lifestyle Brush your teeth every morning and night with fluoride toothpaste. Floss one time each day. Exercise for at least 30 minutes 5 or more days each week. Do not use any products that contain nicotine or tobacco. These products include cigarettes, chewing tobacco, and vaping devices, such as e-cigarettes. If you need help quitting, ask your health care provider. Do not use drugs. If you are sexually active, practice safe sex. Use a condom or other form of protection in order to prevent STIs. Take aspirin only as told by your health care provider. Make sure that you understand how much to take and what form to take. Work with your health care provider to find out whether it is safe and beneficial for you to take aspirin daily. Ask your health care provider if you need to take a cholesterol-lowering medicine (statin). Find healthy ways to manage stress, such as: Meditation, yoga, or listening to music. Journaling. Talking to a trusted person. Spending time with friends and family. Minimize exposure to UV radiation to reduce your risk of skin cancer. Safety Always wear your seat belt while driving or riding in a vehicle. Do not drive: If you have been drinking alcohol. Do not ride with someone who has been drinking. When you are tired or distracted. While texting. If you have been using any mind-altering substances or drugs. Wear a helmet and other protective equipment during sports activities. If you have firearms in your house, make sure you follow all gun safety procedures. What's next? Visit your health care provider once a year for an annual wellness visit. Ask your health care provider how often you should have your eyes and teeth checked. Stay up to date on all vaccines. This information is not intended to replace advice given to you by your health care provider. Make sure you discuss any questions you  have with your health care provider. Document Revised: 11/20/2020 Document Reviewed: 11/20/2020 Elsevier Patient Education  2024 [employer].

## 2023-09-21 ENCOUNTER — Encounter: Payer: Self-pay | Admitting: Family Medicine

## 2023-09-21 LAB — CBC WITH DIFFERENTIAL/PLATELET
Basophils Absolute: 0.1 10*3/uL (ref 0.0–0.1)
Basophils Relative: 1.2 % (ref 0.0–3.0)
Eosinophils Absolute: 0.2 10*3/uL (ref 0.0–0.7)
Eosinophils Relative: 4 % (ref 0.0–5.0)
HCT: 41.5 % (ref 36.0–46.0)
Hemoglobin: 14.1 g/dL (ref 12.0–15.0)
Lymphocytes Relative: 25.3 % (ref 12.0–46.0)
Lymphs Abs: 1.2 10*3/uL (ref 0.7–4.0)
MCHC: 34.1 g/dL (ref 30.0–36.0)
MCV: 90.9 fl (ref 78.0–100.0)
Monocytes Absolute: 0.6 10*3/uL (ref 0.1–1.0)
Monocytes Relative: 11.6 % (ref 3.0–12.0)
Neutro Abs: 2.9 10*3/uL (ref 1.4–7.7)
Neutrophils Relative %: 57.9 % (ref 43.0–77.0)
Platelets: 260 10*3/uL (ref 150.0–400.0)
RBC: 4.56 Mil/uL (ref 3.87–5.11)
RDW: 13.5 % (ref 11.5–15.5)
WBC: 4.9 10*3/uL (ref 4.0–10.5)

## 2023-09-21 LAB — COMPREHENSIVE METABOLIC PANEL WITH GFR
ALT: 34 U/L (ref 0–35)
AST: 27 U/L (ref 0–37)
Albumin: 4.5 g/dL (ref 3.5–5.2)
Alkaline Phosphatase: 61 U/L (ref 39–117)
BUN: 15 mg/dL (ref 6–23)
CO2: 28 meq/L (ref 19–32)
Calcium: 9.4 mg/dL (ref 8.4–10.5)
Chloride: 102 meq/L (ref 96–112)
Creatinine, Ser: 0.75 mg/dL (ref 0.40–1.20)
GFR: 80.34 mL/min (ref >60.00)
Glucose, Bld: 100 mg/dL — ABNORMAL HIGH (ref 70–99)
Potassium: 4.1 meq/L (ref 3.5–5.1)
Sodium: 138 meq/L (ref 135–145)
Total Bilirubin: 0.5 mg/dL (ref 0.2–1.2)
Total Protein: 7 g/dL (ref 6.0–8.3)

## 2023-09-21 LAB — HEMOGLOBIN A1C: Hgb A1c MFr Bld: 6.2 % (ref 4.6–6.5)

## 2023-09-21 LAB — MAGNESIUM: Magnesium: 2.2 mg/dL (ref 1.5–2.5)

## 2023-09-21 LAB — VITAMIN D 25 HYDROXY (VIT D DEFICIENCY, FRACTURES): VITD: 32.56 ng/mL (ref 30.00–100.00)

## 2023-09-24 ENCOUNTER — Encounter: Payer: Self-pay | Admitting: Family Medicine

## 2023-09-28 ENCOUNTER — Ambulatory Visit (HOSPITAL_BASED_OUTPATIENT_CLINIC_OR_DEPARTMENT_OTHER)
Admission: RE | Admit: 2023-09-28 | Discharge: 2023-09-28 | Disposition: A | Discharge status: Home / Self Care | Source: Ambulatory Visit | Attending: Family Medicine | Admitting: Family Medicine

## 2023-09-28 ENCOUNTER — Encounter (HOSPITAL_BASED_OUTPATIENT_CLINIC_OR_DEPARTMENT_OTHER): Payer: Self-pay

## 2023-09-28 DIAGNOSIS — Z1231 Encounter for screening mammogram for malignant neoplasm of breast: Secondary | ICD-10-CM | POA: Diagnosis not present

## 2023-09-29 ENCOUNTER — Other Ambulatory Visit: Payer: Self-pay

## 2023-09-29 ENCOUNTER — Encounter: Payer: Self-pay | Admitting: Family Medicine

## 2023-09-29 ENCOUNTER — Ambulatory Visit (INDEPENDENT_AMBULATORY_CARE_PROVIDER_SITE_OTHER)

## 2023-09-29 ENCOUNTER — Ambulatory Visit: Admitting: Family Medicine

## 2023-09-29 VITALS — BP 116/72 | HR 76 | Ht 63.0 in | Wt 238.0 lb

## 2023-09-29 DIAGNOSIS — G8929 Other chronic pain: Secondary | ICD-10-CM

## 2023-09-29 DIAGNOSIS — M25461 Effusion, right knee: Secondary | ICD-10-CM | POA: Diagnosis not present

## 2023-09-29 DIAGNOSIS — M25561 Pain in right knee: Secondary | ICD-10-CM | POA: Diagnosis not present

## 2023-09-29 NOTE — Progress Notes (Signed)
   I, Miquel Amen, CMA acting as a scribe for Destiny Juniper, MD.  Destiny [name] is a 73 y.o. female who presents to [facility] at [facility] today for R knee pain x 6-8 months, worsening recently. Has suffered a fall in the past, about 15 years ago, injuring the right knee. Pt locates pain to peri-patellar region. No recent imaging.   She is looking for some exercises or treatments that she can do on her own to help get her knee stronger and prevent it from getting worse.  R Knee swelling: present Mechanical symptoms: not currently Aggravates: WB, ambulation Treatments tried: Tylenol , IBU, BioFreeze  Dx testing: 07/03/20 DEXA scan  Pertinent review of systems: No fevers or chills  Relevant historical information: Hypertension and coronary artery disease.   Exam:  BP 116/72   Pulse 76   Ht 5\' 3"  (1.6 m)   Wt 238 lb (108 kg)   SpO2 97%   BMI 42.16 kg/m  General: Well Developed, well nourished, and in no acute distress.   MSK: Right knee mild effusion otherwise normal. Normal motion. Tender palpation mildly at medial joint line. Stable ligamentous exam. Intact strength.    Lab and Radiology Results  Diagnostic Limited MSK Ultrasound of: Right knee Quad tendon intact normal. Trace joint effusion present at superior patellar space. Patellar tendon is normal-appearing. Medial joint line minimal degeneration.  Lateral joint line normal. Posterior knee no Baker's cyst. Impression: Mild DJD  X-ray images right knee obtained today personally and independently interpreted. Mild DJD.  No acute fractures. Await formal radiology review  Assessment and Plan: 73 y.o. female with chronic right knee pain worsening after a fall landing on the anterior aspect of her knee about 15 years ago.  Dominant problems today are mild degenerative changes in some weakness about the knee.  Plan for trial of physical therapy.  Continue Tylenol  and Voltaren  gel.  Consider  steroid injection if not better.  Check back in about 2 months.   PDMP not reviewed this encounter. Orders Placed This Encounter  Procedures   US  LIMITED JOINT SPACE STRUCTURES LOW RIGHT(NO LINKED CHARGES)    Reason for Exam (SYMPTOM  OR DIAGNOSIS REQUIRED):   right knee pain    Preferred imaging location?:   [facility] Knee AP/LAT W/[place] Right    Standing Status:   Future    Number of Occurrences:   1    Expiration Date:   10/29/2023    Reason for Exam (SYMPTOM  OR DIAGNOSIS REQUIRED):   right knee pain    Preferred imaging location?:   [facility]   Ambulatory referral to Physical Therapy    Referral Priority:   Routine    Referral Type:   Physical Medicine    Referral Reason:   Specialty Services Required    Requested Specialty:   Physical Therapy    Number of Visits Requested:   1   No orders of the defined types were placed in this encounter.    Discussed warning signs or symptoms. Please see discharge instructions. Patient expresses understanding.   The above documentation has been reviewed and is accurate and complete Destiny [name], M.D.

## 2023-09-29 NOTE — Patient Instructions (Addendum)
 Thank you for coming in today.   Please get an Xray today before you leave   A referral for physical therapy has been submitted. A representative from the physical therapy office will contact you to coordinate scheduling after confirming your benefits with your insurance provider. If you do not hear from the physical therapy office within the next 1-2 weeks, please let us  know.   See you back in 2 months

## 2023-09-30 ENCOUNTER — Encounter: Payer: Medicare PPO | Admitting: Family Medicine

## 2023-10-04 NOTE — Progress Notes (Signed)
 Right knee x-ray shows minimal arthritis underneath the kneecap.

## 2023-10-10 ENCOUNTER — Other Ambulatory Visit: Payer: Self-pay | Admitting: Internal Medicine

## 2023-10-11 ENCOUNTER — Encounter: Payer: Self-pay | Admitting: Family Medicine

## 2023-10-11 ENCOUNTER — Other Ambulatory Visit: Payer: Self-pay | Admitting: Internal Medicine

## 2023-10-11 ENCOUNTER — Other Ambulatory Visit: Payer: Self-pay | Admitting: Family Medicine

## 2023-10-11 DIAGNOSIS — I10 Essential (primary) hypertension: Secondary | ICD-10-CM

## 2023-10-11 DIAGNOSIS — G8929 Other chronic pain: Secondary | ICD-10-CM

## 2023-10-11 NOTE — Telephone Encounter (Unsigned)
 Copied from CRM [id number]. Topic: Clinical - Medication Refill >> Oct 11, 2023  3:18 PM Annelle Kiel wrote: Most Recent Primary Care Visit:  Provider: Randie Bustle A  Department: [facility]  Visit Type: PHYSICAL  Date: 09/20/2023  Medication: both blood pressure medications   Has the patient contacted their pharmacy? No (Agent: If no, request that the patient contact the pharmacy for the refill. If patient does not wish to contact the pharmacy document the reason why and proceed with request.) (Agent: If yes, when and what did the pharmacy advise?)  Is this the correct pharmacy for this prescription? Yes If no, delete pharmacy and type the correct one.  This is the patient's preferred pharmacy:  [facility] - [place], [place] - [address] [facility] [address] [place] [address] [place] [address] Phone: [phone] Fax: [phone]    Has the prescription been filled recently? No  Is the patient out of the medication? Yes  Has the patient been seen for an appointment in the last year OR does the patient have an upcoming appointment? Yes  Can we respond through MyChart? Yes  Agent: Please be advised that Rx refills may take up to 3 business days. We ask that you follow-up with your pharmacy.

## 2023-10-12 MED ORDER — THYROID 120 MG PO TABS: 120.0000 mg | ORAL_TABLET | Freq: Every day | ORAL | 1 refills | Status: DC

## 2023-10-12 MED ORDER — METOPROLOL SUCCINATE ER 25 MG PO TB24
25.0000 mg | ORAL_TABLET | Freq: Every day | ORAL | 1 refills | Status: DC
Start: 2023-10-12 — End: 2023-10-19

## 2023-10-12 MED ORDER — FUROSEMIDE 40 MG PO TABS: 40.0000 mg | ORAL_TABLET | Freq: Every day | ORAL | 3 refills | Status: AC

## 2023-10-12 NOTE — Telephone Encounter (Signed)
 Pt last saw Dr Maximo Spar 07/23/23, last labs 09/20/23 Creat 0.75, age 73, weight 108kg, based on specified criteria pt is on appropriate dosage of Eliquis  5mg  BID for afib.  Will refill rx.

## 2023-10-15 ENCOUNTER — Encounter: Payer: Self-pay | Admitting: Emergency Medicine

## 2023-10-17 ENCOUNTER — Encounter: Payer: Self-pay | Admitting: Internal Medicine

## 2023-10-18 ENCOUNTER — Encounter: Payer: Self-pay | Admitting: Family Medicine

## 2023-10-18 DIAGNOSIS — I10 Essential (primary) hypertension: Secondary | ICD-10-CM

## 2023-10-19 MED ORDER — IRBESARTAN 150 MG PO TABS: 150.0000 mg | ORAL_TABLET | Freq: Every day | ORAL | 1 refills | Status: DC

## 2023-10-19 MED ORDER — THYROID 15 MG PO TABS: 15.0000 mg | ORAL_TABLET | Freq: Every day | ORAL | 1 refills | Status: DC

## 2023-10-19 MED ORDER — METOPROLOL SUCCINATE ER 25 MG PO TB24: 25.0000 mg | ORAL_TABLET | Freq: Every day | ORAL | 1 refills | Status: DC

## 2023-10-19 MED ORDER — THYROID 120 MG PO TABS: 120.0000 mg | ORAL_TABLET | Freq: Every day | ORAL | 1 refills | Status: DC

## 2023-10-20 ENCOUNTER — Encounter: Payer: Self-pay | Admitting: Urology

## 2023-10-20 ENCOUNTER — Ambulatory Visit (INDEPENDENT_AMBULATORY_CARE_PROVIDER_SITE_OTHER): Admitting: Urology

## 2023-10-20 DIAGNOSIS — N3281 Overactive bladder: Secondary | ICD-10-CM

## 2023-10-20 NOTE — Progress Notes (Signed)
 PTNS  Session # 1  Health & Social Factors:  Caffeine: 3-4 Alcohol: 0 Daytime voids #per day: 8-10 Night-time voids #per night: 2 Urgency: Strong Incontinence Episodes #per day: 1 Ankle used: Left Treatment Setting: 14 Feeling/ Response: Feeling Comments:   Performed By: Nila Barth, LPN  Follow Up: Next week

## 2023-10-20 NOTE — Patient Instructions (Signed)
Tracking Your Bladder Symptoms   Patient Name:___________________________________________________  Week Starting:____________________________________   Day Daytime  Voids Nighttime  Voids Urgency for the Day (none, mild, strong, severe) Number of Accidents/ Leaks Beverages Comments  Monday        Tuesday        Wednesday        Thursday        Friday        Saturday        Sunday         This week my symptoms were:  O much better O better O the same O worse

## 2023-10-27 ENCOUNTER — Ambulatory Visit: Admitting: Urology

## 2023-10-27 DIAGNOSIS — R350 Frequency of micturition: Secondary | ICD-10-CM

## 2023-10-27 NOTE — Progress Notes (Addendum)
 PTNS  Session # 2  Health & Social Factors: No Change Caffeine: 4 Alcohol: 0 Daytime voids #per day: 13 Night-time voids #per night: 2-3 Urgency: Strong Incontinence Episodes #per day: 2 Ankle used: Right Treatment Setting: 16 Feeling/ Response: F[place]   Performed By: C. Candee Cha, LPN   I attended the patient's visit

## 2023-10-27 NOTE — Patient Instructions (Signed)
Tracking Your Bladder Symptoms   Patient Name:___________________________________________________  Week Starting:____________________________________   Day Daytime  Voids Nighttime  Voids Urgency for the Day (none, mild, strong, severe) Number of Accidents/ Leaks Beverages Comments  Monday        Tuesday        Wednesday        Thursday        Friday        Saturday        Sunday         This week my symptoms were:  O much better O better O the same O worse

## 2023-11-02 ENCOUNTER — Other Ambulatory Visit (HOSPITAL_BASED_OUTPATIENT_CLINIC_OR_DEPARTMENT_OTHER)

## 2023-11-03 ENCOUNTER — Encounter: Payer: Self-pay | Admitting: Urology

## 2023-11-03 ENCOUNTER — Ambulatory Visit (INDEPENDENT_AMBULATORY_CARE_PROVIDER_SITE_OTHER): Admitting: Urology

## 2023-11-03 DIAGNOSIS — N3281 Overactive bladder: Secondary | ICD-10-CM

## 2023-11-03 NOTE — Patient Instructions (Signed)
Tracking Your Bladder Symptoms   Patient Name:___________________________________________________  Week Starting:____________________________________   Day Daytime  Voids Nighttime  Voids Urgency for the Day (none, mild, strong, severe) Number of Accidents/ Leaks Beverages Comments  Monday        Tuesday        Wednesday        Thursday        Friday        Saturday        Sunday         This week my symptoms were:  O much better O better O the same O worse

## 2023-11-03 NOTE — Progress Notes (Signed)
 PTNS  Session # 3  Health & Social Factors: no change Caffeine: 2-3 cups daily Alcohol: 0 Daytime voids #per day: 7 avg Night-time voids #per night: 3 avg Urgency: mild Incontinence Episodes #per day: 0-1 Ankle used: left Treatment Setting: 8 Feeling/ Response: Sensory Comments: n/a  Performed By: Crysta CMA

## 2023-11-08 ENCOUNTER — Other Ambulatory Visit: Payer: Self-pay

## 2023-11-08 ENCOUNTER — Ambulatory Visit: Attending: Family Medicine | Admitting: Physical Therapy

## 2023-11-08 DIAGNOSIS — R278 Other lack of coordination: Secondary | ICD-10-CM | POA: Diagnosis not present

## 2023-11-08 DIAGNOSIS — R2689 Other abnormalities of gait and mobility: Secondary | ICD-10-CM | POA: Diagnosis not present

## 2023-11-08 DIAGNOSIS — M6281 Muscle weakness (generalized): Secondary | ICD-10-CM | POA: Insufficient documentation

## 2023-11-08 DIAGNOSIS — G8929 Other chronic pain: Secondary | ICD-10-CM | POA: Insufficient documentation

## 2023-11-08 DIAGNOSIS — R29898 Other symptoms and signs involving the musculoskeletal system: Secondary | ICD-10-CM | POA: Insufficient documentation

## 2023-11-08 DIAGNOSIS — M25561 Pain in right knee: Secondary | ICD-10-CM | POA: Insufficient documentation

## 2023-11-08 NOTE — Therapy (Signed)
 OUTPATIENT PHYSICAL THERAPY LOWER EXTREMITY EVALUATION   Patient Name: SARANN TREGRE MRN: [id number] DOB:[birth year], 73 y.o., female Today's Date: 11/08/2023  END OF SESSION:  PT End of Session - 11/08/23 1450     Visit Number 1    Number of Visits 12    Date for PT Re-Evaluation 12/20/23    Authorization Type [employer]    PT Start Time 1451    PT Stop Time 1530    PT Time Calculation (min) 39 min             Past Medical History:  Diagnosis Date   Abnormal blood chemistry    Arthropathy    Back pain    Breast cyst    Cellulitis    Chest discomfort    Chronic bronchitis (HCC)    Constipation    Disturbance of skin sensation    Edema    Encounter for screening and preventative care 06/17/2016   Fatty liver    GERD (gastroesophageal reflux disease)    Hiatal hernia with gastroesophageal reflux 06/17/2016   High risk medication use    Hoarseness 06/15/2016   Hyperglycemia    Hyperglycemia 12/21/2016   Hyperlipidemia    Hyperlipidemia, mixed 06/15/2016   Hypertension    Hypopotassemia    Hypothyroidism    Joint pain    Menopause    Obesity    Palpitations    Positive ANA (antinuclear antibody)    Pre-diabetes    Shoulder pain, right    Skin cancer    Leg   Sleep apnea    denies   SOB (shortness of breath)    Thyroid  disease 12/11/2014   Thyroid  nodule    Urinary incontinence 04/01/2017   Vitamin D  deficiency    Vitamin D  deficiency    Past Surgical History:  Procedure Laterality Date   BREAST CYST ASPIRATION Left    30 years ago  1970's or 1980's   CORONARY PRESSURE/FFR STUDY N/A 04/25/2018   Procedure: INTRAVASCULAR PRESSURE WIRE/FFR STUDY;  Surgeon: Lucendia Rusk, MD;  Location: [facility] INVASIVE CV LAB;  Service: Cardiovascular;  Laterality: N/A;   LEFT HEART CATH AND CORONARY ANGIOGRAPHY N/A 04/25/2018   Procedure: LEFT HEART CATH AND CORONARY ANGIOGRAPHY;  Surgeon: Lucendia Rusk, MD;  Location: [facility] INVASIVE CV LAB;  Service:  Cardiovascular;  Laterality: N/A;   LIPOSUCTION  1990   over gluteal area    TRANSESOPHAGEAL ECHOCARDIOGRAM (CATH LAB) N/A 08/12/2023   Procedure: TRANSESOPHAGEAL ECHOCARDIOGRAM;  Surgeon: Jann Melody, MD;  Location: [facility] INVASIVE CV LAB;  Service: Cardiovascular;  Laterality: N/A;   VEIN SURGERY Left 2022   WISDOM TOOTH EXTRACTION     and all upper teeth   Patient Active Problem List   Diagnosis Date Noted   Mitral annular calcification 08/12/2023   Urine frequency 01/14/2023   Muscle cramp 04/21/2022   Hair loss 04/21/2022   Osteopenia 04/21/2022   Fatty liver disease, nonalcoholic 04/15/2021   Family history of premature CAD 05/13/2018   CAD (coronary artery disease) 05/13/2018   Endometrium, hyperplasia 04/13/2018   Vitamin B12 deficiency 04/12/2018   Arthritis 04/12/2018   Constipation 02/10/2018   Abdominal pain 02/10/2018   Atypical chest pain 02/10/2018   Dyspepsia 02/10/2018   Mixed incontinence urge and stress 04/01/2017   Morbid obesity (HCC) 01/25/2017   Mixed hyperlipidemia 01/07/2017   Vitamin D  deficiency 01/07/2017   Hyperglycemia 12/21/2016   Plantar fasciitis 09/02/2016   Preventative health care 06/17/2016   Hiatal hernia with  gastroesophageal reflux 06/17/2016   Hoarseness 06/15/2016   DDD (degenerative disc disease), cervical 05/04/2016   Foraminal stenosis of cervical region 05/04/2016   Neck pain 05/04/2016   Numbness and tingling in both hands 05/04/2016   Essential hypertension 03/26/2016   Hypothyroidism 12/11/2014    PCP: Neda Balk, MD  REFERRING PROVIDER: Syliva Even, MD  REFERRING DIAG: [phone] (ICD-10-CM) - Chronic pain of right knee  THERAPY DIAG:  Chronic pain of right knee  Muscle weakness (generalized)  Other lack of coordination  Other abnormalities of gait and mobility  Other symptoms and signs involving the musculoskeletal system  Rationale for Evaluation and Treatment: Rehabilitation  ONSET  DATE: ~1 year  SUBJECTIVE:   SUBJECTIVE STATEMENT: Pt states she went to run and there was ice on the cement and fell on her R knee >15 years ago. Pt states now she feels that her hip and foot are compensating for her pain. Pt states when she turns in bed she feels the knee. States there are days she has a limp. Light constant knee pain but it can move around. Pt reports times that her knee does give out (especially on stairs) but it's not ever made her fall. Pt states she sleeps with R knee flexed and hip externally rotated.   PERTINENT HISTORY: From 09/29/23 Sports Medicine H&P: R knee pain x 6-8 months, worsening recently. Has suffered a fall in the past, about 15 years ago, injuring the right knee. Pt locates pain to peri-patellar region. No recent imaging.   PAIN:  Are you having pain? Yes: NPRS scale: 0 currently, at worst 10 Pain location: R anterior knee Pain description: dull pain Aggravating factors: Cold weather, biking and walking >10 min, stairs Relieving factors: tylenol , sometimes icy/hot and biofreeze can help  PRECAUTIONS: None  RED FLAGS: None   WEIGHT BEARING RESTRICTIONS: No  FALLS:  Has patient fallen in last 6 months? 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  DIAGNOSTIC FINDINGS: 09/29/23 R knee x-ray IMPRESSION: 1. Minimal patellofemoral osteoarthritis. 2. Tiny joint effusion.  PATIENT SURVEYS:  LEFS  Extreme difficulty/unable (0), Quite a bit of difficulty (1), Moderate difficulty (2), Little difficulty (3), No difficulty (4) Survey date:  11/08/23  Any of your usual work,  housework or school activities 2  2. Usual hobbies, recreational or sporting activities 1  3. Getting into/out of the bath 4  4. Walking between rooms 3  5. Putting on socks/shoes 4  6. Squatting  3  7. Lifting an object, like a bag of groceries from the floor 4  8. Performing light activities around your home 4  9. Performing heavy activities around your home 3  10. Getting into/out of a car 3  11. Walking 2 blocks 1  12. Walking 1 mile 0  13. Going up/down 10 stairs (1 flight) 1  14. Standing for 1 hour 2  15.  sitting for 1 hour 4  16. Running on even ground 1  17. Running on uneven ground 0  18. Making sharp turns while running fast 0  19. Hopping  0  20. Rolling over in bed 2  Score total:  42/80     COGNITION: Overall cognitive status: Within functional limits for tasks assessed     SENSATION: WFL  EDEMA:  Occasionally  MUSCLE LENGTH: Hamstrings: Right ~80 deg but turns externally; Left ~80 deg Thomas test: Right = Left  POSTURE: R patella laterally displaced vs L patella  PALPATION: TTP R patellar tendon Increased firing of vastus lateralis vs VMO on R during quad setting  LOWER EXTREMITY ROM:  Active ROM Right eval Left eval  Hip flexion    Hip extension    Hip abduction    Hip adduction    Hip internal rotation prone 50 50  Hip external rotation prone 70 70  Knee flexion 115 120  Knee extension -5 0  Ankle dorsiflexion    Ankle plantarflexion    Ankle inversion    Ankle eversion     (Blank rows = not tested)  LOWER EXTREMITY MMT:  MMT Right eval Left eval  Hip flexion 4 5  Hip extension 4- 4  Hip abduction 3 3+  Hip adduction    Hip internal rotation    Hip external rotation    Knee flexion 5 5  Knee extension 4* 5  Ankle dorsiflexion    Ankle plantarflexion    Ankle inversion    Ankle eversion     (Blank rows = not tested, * = pain)  LOWER EXTREMITY SPECIAL TESTS:  Knee special tests: Patellafemoral grind test: positive    FUNCTIONAL TESTS:  Single leg balance: 6 sec on R, 7 sec on L Squat: increased knee flexion past toes, hips externally rotated  GAIT: Distance walked: Into clinic Assistive device utilized: None Level of assistance: Complete Independence Comments: feet externally rotated bilat                                                                                                                                TREATMENT DATE: 11/08/23 See HEP below Self care: bracing/taping for improved patellar alignment, NMES for improved VMO firing, and education below    PATIENT EDUCATION:  Education details: Exam findings, anatomy/physiology of condition, POC, initial HEP Person educated: Patient Education method: Explanation, Demonstration, and Handouts Education comprehension: verbalized understanding and needs further education  HOME EXERCISE PROGRAM: Access Code: [email or web address] URL: [email or web address] Date: 11/08/2023 Prepared by: Tinley Rought April Erman [place]  Exercises - Short Arc [place] with Celeste Cola Squeeze  - 1 x daily - 7 x weekly - 2 sets - 10 reps - Sidelying Hip Abduction  - 1 x daily - 7 x weekly - 2 sets - 10 reps   ASSESSMENT:  CLINICAL IMPRESSION: Patient is a 73 y.o. F who was seen today for physical therapy evaluation and treatment for R knee pain. PMH significant for prior R knee injury but no past surgical history. 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 Assessment was significant for laterally displaced patella, increased vastus lateralis activation/shortening and increased R hip ER, weak R VMO  and hip abductors causing patellar maltracking and increased patellar tendon inflammation. Pt will benefit from PT to improve on these deficits to increase her R LE instability and weakness for home and community tasks.   OBJECTIVE IMPAIRMENTS: Abnormal gait, decreased activity tolerance, decreased balance, decreased coordination, decreased endurance, decreased mobility,  difficulty walking, decreased ROM, decreased strength, increased fascial restrictions, improper body mechanics, postural dysfunction, and pain.   ACTIVITY LIMITATIONS: carrying, lifting, standing, squatting, stairs, transfers, locomotion level, and caring for others  PARTICIPATION LIMITATIONS: meal prep, cleaning, community activity, and yard work  PERSONAL FACTORS: Age, Fitness, Past/current experiences, and Time since onset of injury/illness/exacerbation are also affecting patient's functional outcome.   REHAB POTENTIAL: Good  CLINICAL DECISION MAKING: Evolving/moderate complexity  EVALUATION COMPLEXITY: Moderate   GOALS: Goals reviewed with patient? Yes  SHORT TERM GOALS: Target date: 11/29/2023  Pt will be ind with initial HEP Baseline: Goal status: INITIAL  2.  Pt's patella will be </=2 cm from center of her tibiofemoral joint Baseline: ~4 cm from center Goal status: INITIAL    LONG TERM GOALS: Target date: 12/20/2023   Pt will be ind with management and progression of HEP Baseline:  Goal status: INITIAL  2.  Pt will report >/=50% improvement of overall knee pain with activities Baseline:  Goal status: INITIAL  3.  Pt will be able to squat at least 25# with proper form for lifting/carrying tasks at home Baseline:  Goal status: INITIAL  4.  Pt will be able to ascend/descend her steps at home in a reciprocal pattern without pain to demo increased knee strength and stability Baseline:  Goal status: INITIAL  5.  Pt will have improved LEFS to >/=51/80 to demo MCID Baseline: 42 Goal status: INITIAL     PLAN:  PT FREQUENCY: 2x/week  PT DURATION: 6 weeks  PLANNED INTERVENTIONS: 97164- PT Re-evaluation, 97750- Physical Performance Testing, 97110-Therapeutic exercises, 97530- Therapeutic activity, [id number]- Neuromuscular re-education, 97535- Self Care, 84132- Manual therapy, [id number]- Gait training, [phone]- Aquatic Therapy, [phone]- Electrical stimulation (unattended),  [phone]- Electrical stimulation (manual), 97016- Vasopneumatic device, [id number]- Ionotophoresis 4mg /ml Dexamethasone , Patient/Family education, Balance training, Stair training, Taping, Joint mobilization, Cryotherapy, Moist heat, and Biofeedback  PLAN FOR NEXT SESSION: Assess response to HEP. NMES/Russian for VMO activation. Quad strengthening and hip abductor strengthening. Work on patellar/knee positioning/mechanics with squatting/stairs. Stretch ITB/lateral quads   Mahaley Schwering April Ma L Moana Munford, PT, DPT 11/08/2023, 4:03 PM

## 2023-11-08 NOTE — Patient Instructions (Signed)
 Top 3 VMO Strength Exercises 1. Quad Sets - With the knee extended, contract the muscles in the front of the thigh as tightly as possible. Hold 10 seconds, 10 X.   2. Straight Leg Raises - Lying on your back with the leg straight and the opposite leg bent, tighten the thigh and lift the straight leg about 12 inches off the surface. Hold 3 seconds, repeat 10 x 2 sets.

## 2023-11-10 ENCOUNTER — Ambulatory Visit

## 2023-11-10 DIAGNOSIS — M6281 Muscle weakness (generalized): Secondary | ICD-10-CM

## 2023-11-10 DIAGNOSIS — R2689 Other abnormalities of gait and mobility: Secondary | ICD-10-CM | POA: Diagnosis not present

## 2023-11-10 DIAGNOSIS — R278 Other lack of coordination: Secondary | ICD-10-CM

## 2023-11-10 DIAGNOSIS — G8929 Other chronic pain: Secondary | ICD-10-CM

## 2023-11-10 DIAGNOSIS — R29898 Other symptoms and signs involving the musculoskeletal system: Secondary | ICD-10-CM

## 2023-11-10 DIAGNOSIS — M25561 Pain in right knee: Secondary | ICD-10-CM | POA: Diagnosis not present

## 2023-11-10 NOTE — Therapy (Signed)
 OUTPATIENT PHYSICAL THERAPY LOWER EXTREMITY EVALUATION   Patient Name: Destiny [name] MRN: [id number] DOB:1951-04-10, 73 y.o., female Today's Date: 11/10/2023  END OF SESSION:  PT End of Session - 11/10/23 1450     Visit Number 2    Number of Visits 12    Date for PT Re-Evaluation 12/20/23    Authorization Type [employer]    PT Start Time 1403    PT Stop Time 1447    PT Time Calculation (min) 44 min              Past Medical History:  Diagnosis Date   Abnormal blood chemistry    Arthropathy    Back pain    Breast cyst    Cellulitis    Chest discomfort    Chronic bronchitis (HCC)    Constipation    Disturbance of skin sensation    Edema    Encounter for screening and preventative care 06/17/2016   Fatty liver    GERD (gastroesophageal reflux disease)    Hiatal hernia with gastroesophageal reflux 06/17/2016   High risk medication use    Hoarseness 06/15/2016   Hyperglycemia    Hyperglycemia 12/21/2016   Hyperlipidemia    Hyperlipidemia, mixed 06/15/2016   Hypertension    Hypopotassemia    Hypothyroidism    Joint pain    Menopause    Obesity    Palpitations    Positive ANA (antinuclear antibody)    Pre-diabetes    Shoulder pain, right    Skin cancer    Leg   Sleep apnea    denies   SOB (shortness of breath)    Thyroid  disease 12/11/2014   Thyroid  nodule    Urinary incontinence 04/01/2017   Vitamin D  deficiency    Vitamin D  deficiency    Past Surgical History:  Procedure Laterality Date   BREAST CYST ASPIRATION Left    30 years ago  1970's or 1980's   CORONARY PRESSURE/FFR STUDY N/A 04/25/2018   Procedure: INTRAVASCULAR PRESSURE WIRE/FFR STUDY;  Surgeon: Lucendia Rusk, MD;  Location: [facility] INVASIVE CV LAB;  Service: Cardiovascular;  Laterality: N/A;   LEFT HEART CATH AND CORONARY ANGIOGRAPHY N/A 04/25/2018   Procedure: LEFT HEART CATH AND CORONARY ANGIOGRAPHY;  Surgeon: Lucendia Rusk, MD;  Location: [facility] INVASIVE CV LAB;  Service:  Cardiovascular;  Laterality: N/A;   LIPOSUCTION  1990   over gluteal area    TRANSESOPHAGEAL ECHOCARDIOGRAM (CATH LAB) N/A 08/12/2023   Procedure: TRANSESOPHAGEAL ECHOCARDIOGRAM;  Surgeon: Jann Melody, MD;  Location: [facility] INVASIVE CV LAB;  Service: Cardiovascular;  Laterality: N/A;   VEIN SURGERY Left 2022   WISDOM TOOTH EXTRACTION     and all upper teeth   Patient Active Problem List   Diagnosis Date Noted   Mitral annular calcification 08/12/2023   Urine frequency 01/14/2023   Muscle cramp 04/21/2022   Hair loss 04/21/2022   Osteopenia 04/21/2022   Fatty liver disease, nonalcoholic 04/15/2021   Family history of premature CAD 05/13/2018   CAD (coronary artery disease) 05/13/2018   Endometrium, hyperplasia 04/13/2018   Vitamin B12 deficiency 04/12/2018   Arthritis 04/12/2018   Constipation 02/10/2018   Abdominal pain 02/10/2018   Atypical chest pain 02/10/2018   Dyspepsia 02/10/2018   Mixed incontinence urge and stress 04/01/2017   Morbid obesity (HCC) 01/25/2017   Mixed hyperlipidemia 01/07/2017   Vitamin D  deficiency 01/07/2017   Hyperglycemia 12/21/2016   Plantar fasciitis 09/02/2016   Preventative health care 06/17/2016   Hiatal hernia  with gastroesophageal reflux 06/17/2016   Hoarseness 06/15/2016   DDD (degenerative disc disease), cervical 05/04/2016   Foraminal stenosis of cervical region 05/04/2016   Neck pain 05/04/2016   Numbness and tingling in both hands 05/04/2016   Essential hypertension 03/26/2016   Hypothyroidism 12/11/2014    PCP: Neda Balk, MD  REFERRING PROVIDER: Syliva Even, MD  REFERRING DIAG: [id number] (ICD-10-CM) - Chronic pain of right knee  THERAPY DIAG:  Chronic pain of right knee  Muscle weakness (generalized)  Other lack of coordination  Other abnormalities of gait and mobility  Other symptoms and signs involving the musculoskeletal system  Rationale for Evaluation and Treatment: Rehabilitation  ONSET  DATE: ~1 year  SUBJECTIVE:   SUBJECTIVE STATEMENT: Pt states she walked a lot yesterday with her husband at the [place], today she has been sitting all day.    PERTINENT HISTORY: From 09/29/23 Sports Medicine H&P: R knee pain x 6-8 months, worsening recently. Has suffered a fall in the past, about 15 years ago, injuring the right knee. Pt locates pain to peri-patellar region. No recent imaging.   PAIN:  Are you having pain? Yes: NPRS scale: 0 currently, at worst 10 Pain location: R anterior knee Pain description: dull pain Aggravating factors: Cold weather, biking and walking >10 min, stairs Relieving factors: tylenol , sometimes icy/hot and biofreeze can help  PRECAUTIONS: None  RED FLAGS: None   WEIGHT BEARING RESTRICTIONS: No  FALLS:  Has patient fallen in last 6 months? 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  DIAGNOSTIC FINDINGS: 09/29/23 R knee x-ray IMPRESSION: 1. Minimal patellofemoral osteoarthritis. 2. Tiny joint effusion.  PATIENT SURVEYS:  LEFS  Extreme difficulty/unable (0), Quite a bit of difficulty (1), Moderate difficulty (2), Little difficulty (3), No difficulty (4) Survey date:  11/08/23  Any of your usual work, housework or school activities 2  2. Usual hobbies, recreational or sporting activities 1  3. Getting into/out of the bath 4  4. Walking between rooms 3  5. Putting on socks/shoes 4  6. Squatting  3  7. Lifting an object, like a bag of groceries from the floor 4  8. Performing light activities around your home 4  9. Performing heavy activities around your home 3  10.  Getting into/out of a car 3  11. Walking 2 blocks 1  12. Walking 1 mile 0  13. Going up/down 10 stairs (1 flight) 1  14. Standing for 1 hour 2  15.  sitting for 1 hour 4  16. Running on even ground 1  17. Running on uneven ground 0  18. Making sharp turns while running fast 0  19. Hopping  0  20. Rolling over in bed 2  Score total:  42/80     COGNITION: Overall cognitive status: Within functional limits for tasks assessed     SENSATION: WFL  EDEMA:  Occasionally  MUSCLE LENGTH: Hamstrings: Right ~80 deg but turns externally; Left ~80 deg Thomas test: Right = Left  POSTURE: R patella laterally displaced vs L patella  PALPATION: TTP R patellar tendon Increased firing of vastus lateralis vs VMO on R during quad setting  LOWER EXTREMITY ROM:  Active ROM Right eval Left eval  Hip flexion    Hip extension    Hip abduction    Hip adduction    Hip internal rotation prone 50 50  Hip external rotation prone 70 70  Knee flexion 115 120  Knee extension -5 0  Ankle dorsiflexion    Ankle plantarflexion    Ankle inversion    Ankle eversion     (Blank rows = not tested)  LOWER EXTREMITY MMT:  MMT Right eval Left eval  Hip flexion 4 5  Hip extension 4- 4  Hip abduction 3 3+  Hip adduction    Hip internal rotation    Hip external rotation    Knee flexion 5 5  Knee extension 4* 5  Ankle dorsiflexion    Ankle plantarflexion    Ankle inversion    Ankle eversion     (Blank rows = not tested, * = pain)  LOWER EXTREMITY SPECIAL TESTS:  Knee special tests: Patellafemoral grind test: positive   FUNCTIONAL TESTS:  Single leg balance: 6 sec on R, 7 sec on L Squat: increased knee flexion past toes, hips externally rotated  GAIT: Distance walked: Into clinic Assistive device utilized: None Level of assistance: Complete Independence Comments: feet externally rotated bilat                                                                                                                                 TREATMENT DATE:  11/10/23 Nustep L5x7min No arms NMES to R quads x 10 min 10/30  Mini squats 10x3" Retro step with arm raise x 20 SLR x 10  Hip abduction x 10  11/08/23 See HEP below Self care: bracing/taping for improved patellar alignment, NMES for improved VMO firing, and education below    PATIENT EDUCATION:  Education details: Exam findings, anatomy/physiology of condition, POC, initial HEP Person educated: Patient Education method: Explanation, Demonstration, and Handouts Education comprehension: verbalized understanding and needs further education  HOME EXERCISE PROGRAM: Access Code: [email or web address] URL: [email or web address] Date: 11/08/2023 Prepared by: Gellen April Erman [place]  Exercises - Short Arc [place] with Ball Squeeze  - 1 x daily - 7 x weekly - 2 sets - 10 reps - Sidelying Hip Abduction  - 1 x daily - 7 x weekly - 2 sets - 10 reps   ASSESSMENT:  CLINICAL IMPRESSION: Progressed motor training and strengthening for R knee musculature to improve stability with daily activities. Cues provided as needed to correct form and target the appropriate muscles. Good response to treatment.   IE: Patient is a 73 y.o. F who was seen today for physical therapy evaluation and treatment for R knee pain. PMH significant for prior R knee injury but no past surgical history. 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 Assessment was significant for laterally displaced patella, increased vastus lateralis activation/shortening and increased R hip ER, weak R VMO and hip abductors causing patellar maltracking and  increased patellar tendon inflammation. Pt will benefit from PT to improve on these deficits to increase her R LE instability and weakness for home and community tasks.   OBJECTIVE IMPAIRMENTS: Abnormal gait, decreased activity tolerance, decreased balance, decreased coordination, decreased endurance, decreased mobility, difficulty walking,  decreased ROM, decreased strength, increased fascial restrictions, improper body mechanics, postural dysfunction, and pain.   ACTIVITY LIMITATIONS: carrying, lifting, standing, squatting, stairs, transfers, locomotion level, and caring for others  PARTICIPATION LIMITATIONS: meal prep, cleaning, community activity, and yard work  PERSONAL FACTORS: Age, Fitness, Past/current experiences, and Time since onset of injury/illness/exacerbation are also affecting patient's functional outcome.   REHAB POTENTIAL: Good  CLINICAL DECISION MAKING: Evolving/moderate complexity  EVALUATION COMPLEXITY: Moderate   GOALS: Goals reviewed with patient? Yes  SHORT TERM GOALS: Target date: 11/29/2023  Pt will be ind with initial HEP Baseline: Goal status: INITIAL  2.  Pt's patella will be </=2 cm from center of her tibiofemoral joint Baseline: ~4 cm from center Goal status: INITIAL    LONG TERM GOALS: Target date: 12/20/2023   Pt will be ind with management and progression of HEP Baseline:  Goal status: INITIAL  2.  Pt will report >/=50% improvement of overall knee pain with activities Baseline:  Goal status: INITIAL  3.  Pt will be able to squat at least 25# with proper form for lifting/carrying tasks at home Baseline:  Goal status: INITIAL  4.  Pt will be able to ascend/descend her steps at home in a reciprocal pattern without pain to demo increased knee strength and stability Baseline:  Goal status: INITIAL  5.  Pt will have improved LEFS to >/=51/80 to demo MCID Baseline: 42 Goal status: INITIAL     PLAN:  PT FREQUENCY: 2x/week  PT DURATION: 6 weeks  PLANNED INTERVENTIONS: 97164- PT Re-evaluation, 97750- Physical Performance Testing, 97110-Therapeutic exercises, 97530- Therapeutic activity, [id number]- Neuromuscular re-education, 97535- Self Care, 47425- Manual therapy, [id number]- Gait training, [phone]- Aquatic Therapy, [phone]- Electrical stimulation (unattended), [phone]- Electrical  stimulation (manual), 97016- Vasopneumatic device, [id number]- Ionotophoresis 4mg /ml Dexamethasone , Patient/Family education, Balance training, Stair training, Taping, Joint mobilization, Cryotherapy, Moist heat, and Biofeedback  PLAN FOR NEXT SESSION: Assess response to HEP. NMES/Russian for VMO activation. Quad strengthening and hip abductor strengthening. Work on patellar/knee positioning/mechanics with squatting/stairs. Stretch ITB/lateral quads   Samuella Crocker, PTA 11/10/2023, 2:50 PM

## 2023-11-11 ENCOUNTER — Encounter: Payer: Self-pay | Admitting: Urology

## 2023-11-11 ENCOUNTER — Ambulatory Visit (INDEPENDENT_AMBULATORY_CARE_PROVIDER_SITE_OTHER): Admitting: Urology

## 2023-11-11 DIAGNOSIS — N3946 Mixed incontinence: Secondary | ICD-10-CM

## 2023-11-11 NOTE — Patient Instructions (Signed)
Tracking Your Bladder Symptoms   Patient Name:___________________________________________________  Week Starting:____________________________________   Day Daytime  Voids Nighttime  Voids Urgency for the Day (none, mild, strong, severe) Number of Accidents/ Leaks Beverages Comments  Monday        Tuesday        Wednesday        Thursday        Friday        Saturday        Sunday         This week my symptoms were:  O much better O better O the same O worse

## 2023-11-11 NOTE — Progress Notes (Signed)
 PTNS  Session # 4  Health & Social Factors: Change Caffeine: 3-4 Alcohol: 1 Daytime voids #per day: 10 Night-time voids #per night: 2 Urgency: Mild Incontinence Episodes #per day: 1 Ankle used: Right Treatment Setting: 19 Feeling/ Response: Sensory Comments: Urgency is less in intensity   Performed By: C.Candee Cha, LPN

## 2023-11-17 ENCOUNTER — Ambulatory Visit: Payer: Self-pay

## 2023-11-17 ENCOUNTER — Ambulatory Visit: Attending: Cardiovascular Disease | Admitting: Pharmacist Clinician (PhC)/ Clinical Pharmacy Specialist

## 2023-11-17 ENCOUNTER — Encounter: Payer: Self-pay | Admitting: Pharmacist Clinician (PhC)/ Clinical Pharmacy Specialist

## 2023-11-17 DIAGNOSIS — R278 Other lack of coordination: Secondary | ICD-10-CM

## 2023-11-17 DIAGNOSIS — G8929 Other chronic pain: Secondary | ICD-10-CM | POA: Diagnosis not present

## 2023-11-17 DIAGNOSIS — M6281 Muscle weakness (generalized): Secondary | ICD-10-CM

## 2023-11-17 DIAGNOSIS — R2689 Other abnormalities of gait and mobility: Secondary | ICD-10-CM | POA: Diagnosis not present

## 2023-11-17 DIAGNOSIS — M25561 Pain in right knee: Secondary | ICD-10-CM | POA: Diagnosis not present

## 2023-11-17 DIAGNOSIS — R29898 Other symptoms and signs involving the musculoskeletal system: Secondary | ICD-10-CM

## 2023-11-17 NOTE — Patient Instructions (Signed)
 We will start the prior authorization process to get Zepbound covered by your insurance.   TIPS FOR SUCCESS Write down the reasons why you want to lose weight and post it in a place where you'll see it often. Start small and work your way up. Keep in mind that it takes time to achieve goals, and small steps add up. Any additional movements help to burn calories. Taking the stairs rather than the elevator and parking at the far end of your parking lot are easy ways to start. Brisk walking for at least 30 minutes 4 or more days of the week is an excellent goal to work toward  [employer] WHAT IT MEANS TO FEEL FULL Did you know that it can take 15 minutes or more for your brain to receive the message that you've eaten? That means that, if you eat less food, but consume it slower, you may still feel satisfied. Eating a lot of fruits and vegetables can also help you feel fuller. Eat off of smaller plates so that moderate portions don't seem too small  TITRATION PLAN Will plan to follow the titration plan as below, pending patient is tolerating each dose before increasing to the next. Can slow titration if needed for tolerability.    Start with the 2.5 mg dose of Zepbound and increase by 2.5 mg increments every 4 weeks as tolerated.  Follow up by MyChart every 4 weeks  If you have any questions or concerns, please reach out to us .  Shaelyn Decarli/Chris at [phone].  THANK YOU FOR CHOOSING [facility]

## 2023-11-17 NOTE — Therapy (Signed)
 OUTPATIENT PHYSICAL THERAPY LOWER EXTREMITY TREATMENT   Patient Name: Destiny [name] MRN: [id number] DOB:20-Aug-1950, 73 y.o., female Today's Date: 11/17/2023  END OF SESSION:  PT End of Session - 11/17/23 1204     Visit Number 3    Number of Visits 12    Date for PT Re-Evaluation 12/20/23    Authorization Type [employer]    PT Start Time 1148    PT Stop Time 1232    PT Time Calculation (min) 44 min               Past Medical History:  Diagnosis Date   Abnormal blood chemistry    Arthropathy    Back pain    Breast cyst    Cellulitis    Chest discomfort    Chronic bronchitis (HCC)    Constipation    Disturbance of skin sensation    Edema    Encounter for screening and preventative care 06/17/2016   Fatty liver    GERD (gastroesophageal reflux disease)    Hiatal hernia with gastroesophageal reflux 06/17/2016   High risk medication use    Hoarseness 06/15/2016   Hyperglycemia    Hyperglycemia 12/21/2016   Hyperlipidemia    Hyperlipidemia, mixed 06/15/2016   Hypertension    Hypopotassemia    Hypothyroidism    Joint pain    Menopause    Obesity    Palpitations    Positive ANA (antinuclear antibody)    Pre-diabetes    Shoulder pain, right    Skin cancer    Leg   Sleep apnea    denies   SOB (shortness of breath)    Thyroid  disease 12/11/2014   Thyroid  nodule    Urinary incontinence 04/01/2017   Vitamin D  deficiency    Vitamin D  deficiency    Past Surgical History:  Procedure Laterality Date   BREAST CYST ASPIRATION Left    30 years ago  1970's or 1980's   CORONARY PRESSURE/FFR STUDY N/A 04/25/2018   Procedure: INTRAVASCULAR PRESSURE WIRE/FFR STUDY;  Surgeon: Lucendia Rusk, MD;  Location: [facility] INVASIVE CV LAB;  Service: Cardiovascular;  Laterality: N/A;   LEFT HEART CATH AND CORONARY ANGIOGRAPHY N/A 04/25/2018   Procedure: LEFT HEART CATH AND CORONARY ANGIOGRAPHY;  Surgeon: Lucendia Rusk, MD;  Location: [facility] INVASIVE CV LAB;  Service:  Cardiovascular;  Laterality: N/A;   LIPOSUCTION  1990   over gluteal area    TRANSESOPHAGEAL ECHOCARDIOGRAM (CATH LAB) N/A 08/12/2023   Procedure: TRANSESOPHAGEAL ECHOCARDIOGRAM;  Surgeon: Jann Melody, MD;  Location: [facility] INVASIVE CV LAB;  Service: Cardiovascular;  Laterality: N/A;   VEIN SURGERY Left 2022   WISDOM TOOTH EXTRACTION     and all upper teeth   Patient Active Problem List   Diagnosis Date Noted   Mitral annular calcification 08/12/2023   Urine frequency 01/14/2023   Muscle cramp 04/21/2022   Hair loss 04/21/2022   Osteopenia 04/21/2022   Fatty liver disease, nonalcoholic 04/15/2021   Family history of premature CAD 05/13/2018   CAD (coronary artery disease) 05/13/2018   Endometrium, hyperplasia 04/13/2018   Vitamin B12 deficiency 04/12/2018   Arthritis 04/12/2018   Constipation 02/10/2018   Abdominal pain 02/10/2018   Atypical chest pain 02/10/2018   Dyspepsia 02/10/2018   Mixed incontinence urge and stress 04/01/2017   Morbid obesity (HCC) 01/25/2017   Mixed hyperlipidemia 01/07/2017   Vitamin D  deficiency 01/07/2017   Hyperglycemia 12/21/2016   Plantar fasciitis 09/02/2016   Preventative health care 06/17/2016   Hiatal  hernia with gastroesophageal reflux 06/17/2016   Hoarseness 06/15/2016   DDD (degenerative disc disease), cervical 05/04/2016   Foraminal stenosis of cervical region 05/04/2016   Neck pain 05/04/2016   Numbness and tingling in both hands 05/04/2016   Essential hypertension 03/26/2016   Hypothyroidism 12/11/2014    PCP: Neda Balk, MD  REFERRING PROVIDER: Syliva Even, MD  REFERRING DIAG: [phone] (ICD-10-CM) - Chronic pain of right knee  THERAPY DIAG:  Chronic pain of right knee  Muscle weakness (generalized)  Other lack of coordination  Other abnormalities of gait and mobility  Other symptoms and signs involving the musculoskeletal system  Rationale for Evaluation and Treatment: Rehabilitation  ONSET  DATE: ~1 year  SUBJECTIVE:   SUBJECTIVE STATEMENT: Tried the brace suggested but it didn't feel good, pain is better.   PERTINENT HISTORY: From 09/29/23 Sports Medicine H&P: R knee pain x 6-8 months, worsening recently. Has suffered a fall in the past, about 15 years ago, injuring the right knee. Pt locates pain to peri-patellar region. No recent imaging.   PAIN:  Are you having pain? Yes: NPRS scale: 0 currently, at worst 10 Pain location: R anterior knee Pain description: dull pain Aggravating factors: Cold weather, biking and walking >10 min, stairs Relieving factors: tylenol , sometimes icy/hot and biofreeze can help  PRECAUTIONS: None  RED FLAGS: None   WEIGHT BEARING RESTRICTIONS: No  FALLS:  Has patient fallen in last 6 months? 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  DIAGNOSTIC FINDINGS: 09/29/23 R knee x-ray IMPRESSION: 1. Minimal patellofemoral osteoarthritis. 2. Tiny joint effusion.  PATIENT SURVEYS:  LEFS  Extreme difficulty/unable (0), Quite a bit of difficulty (1), Moderate difficulty (2), Little difficulty (3), No difficulty (4) Survey date:  11/08/23  Any of your usual work, housework or school activities 2  2. Usual hobbies, recreational or sporting activities 1  3. Getting into/out of the bath 4  4. Walking between rooms 3  5. Putting on socks/shoes 4  6. Squatting  3  7. Lifting an object, like a bag of groceries from the floor 4  8. Performing light activities around your home 4  9. Performing heavy activities around your home 3  10. Getting into/out of a car 3   11. Walking 2 blocks 1  12. Walking 1 mile 0  13. Going up/down 10 stairs (1 flight) 1  14. Standing for 1 hour 2  15.  sitting for 1 hour 4  16. Running on even ground 1  17. Running on uneven ground 0  18. Making sharp turns while running fast 0  19. Hopping  0  20. Rolling over in bed 2  Score total:  42/80     COGNITION: Overall cognitive status: Within functional limits for tasks assessed     SENSATION: WFL  EDEMA:  Occasionally  MUSCLE LENGTH: Hamstrings: Right ~80 deg but turns externally; Left ~80 deg Thomas test: Right = Left  POSTURE: R patella laterally displaced vs L patella  PALPATION: TTP R patellar tendon Increased firing of vastus lateralis vs VMO on R during quad setting  LOWER EXTREMITY ROM:  Active ROM Right eval Left eval  Hip flexion    Hip extension    Hip abduction    Hip adduction    Hip internal rotation prone 50 50  Hip external rotation prone 70 70  Knee flexion 115 120  Knee extension -5 0  Ankle dorsiflexion    Ankle plantarflexion    Ankle inversion    Ankle eversion     (Blank rows = not tested)  LOWER EXTREMITY MMT:  MMT Right eval Left eval  Hip flexion 4 5  Hip extension 4- 4  Hip abduction 3 3+  Hip adduction    Hip internal rotation    Hip external rotation    Knee flexion 5 5  Knee extension 4* 5  Ankle dorsiflexion    Ankle plantarflexion    Ankle inversion    Ankle eversion     (Blank rows = not tested, * = pain)  LOWER EXTREMITY SPECIAL TESTS:  Knee special tests: Patellafemoral grind test: positive   FUNCTIONAL TESTS:  Single leg balance: 6 sec on R, 7 sec on L Squat: increased knee flexion past toes, hips externally rotated  GAIT: Distance walked: Into clinic Assistive device utilized: None Level of assistance: Complete Independence Comments: feet externally rotated bilat                                                                                                                                 TREATMENT DATE:  11/17/23 Nustep L5x67min UE/LE KT tape encircling patella 2 I strips for patellar tracking/stabilization Mini squats 2x10; 3 sec hold Retro steps 2x15 with arm raise Toe raises back to counter x 15 Standing calf raises x15  Standing hip abduction x 10 BLE  11/10/23 Nustep L5x39min No arms NMES to R quads x 10 min 10/30  Mini squats 10x3 Retro step with arm raise x 20 SLR x 10  Hip abduction x 10  11/08/23 See HEP below Self care: bracing/taping for improved patellar alignment, NMES for improved VMO firing, and education below    PATIENT EDUCATION:  Education details: Exam findings, anatomy/physiology of condition, POC, initial HEP Person educated: Patient Education method: Explanation, Demonstration, and Handouts Education comprehension: verbalized understanding and needs further education  HOME EXERCISE PROGRAM: Access Code: [email or web address] URL: [email or web address] Date: 11/08/2023 Prepared by: Gellen April Erman [place]  Exercises - Short Arc [place] with Ball Squeeze  - 1 x daily - 7 x weekly - 2 sets - 10 reps - Sidelying Hip Abduction  - 1 x daily - 7 x weekly - 2 sets - 10 reps   ASSESSMENT:  CLINICAL IMPRESSION: Continued working on motor recruitment and involuntary contraction of quads with proper movement/mechanics of knee joint while in standing. We applied KT tape to act as a patellar brace, so will assess if this is affective next visit. Pt was fatigued in her quads from the interventions today but no increased pain.  IE: Patient is a 73 y.o. F who was seen  today for physical therapy evaluation and treatment for R knee pain. PMH significant for prior R knee injury but no past surgical history. 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 Assessment was significant for laterally displaced patella, increased vastus lateralis activation/shortening and increased R hip ER, weak R VMO and hip abductors causing patellar maltracking and  increased patellar tendon inflammation. Pt will benefit from PT to improve on these deficits to increase her R LE instability and weakness for home and community tasks.   OBJECTIVE IMPAIRMENTS: Abnormal gait, decreased activity tolerance, decreased balance, decreased coordination, decreased endurance, decreased mobility, difficulty walking, decreased ROM, decreased strength, increased fascial restrictions, improper body mechanics, postural dysfunction, and pain.   ACTIVITY LIMITATIONS: carrying, lifting, standing, squatting, stairs, transfers, locomotion level, and caring for others  PARTICIPATION LIMITATIONS: meal prep, cleaning, community activity, and yard work  PERSONAL FACTORS: Age, Fitness, Past/current experiences, and Time since onset of injury/illness/exacerbation are also affecting patient's functional outcome.   REHAB POTENTIAL: Good  CLINICAL DECISION MAKING: Evolving/moderate complexity  EVALUATION COMPLEXITY: Moderate   GOALS: Goals reviewed with patient? Yes  SHORT TERM GOALS: Target date: 11/29/2023  Pt will be ind with initial HEP Baseline: Goal status: MET- 11/17/23  2.  Pt's patella will be </=2 cm from center of her tibiofemoral joint Baseline: ~4 cm from center Goal status: INITIAL    LONG TERM GOALS: Target date: 12/20/2023   Pt will be ind with management and progression of HEP Baseline:  Goal status: INITIAL  2.  Pt will report >/=50% improvement of overall knee pain with activities Baseline:  Goal status: INITIAL  3.  Pt will be able to squat at least 25# with proper form for lifting/carrying tasks at home Baseline:  Goal status: INITIAL  4.  Pt will be able to ascend/descend her steps at home in a reciprocal pattern without pain to demo increased knee strength and stability Baseline:  Goal status: INITIAL  5.  Pt will have improved LEFS to >/=51/80 to demo MCID Baseline: 42 Goal status: INITIAL     PLAN:  PT FREQUENCY: 2x/week  PT  DURATION: 6 weeks  PLANNED INTERVENTIONS: 97164- PT Re-evaluation, 97750- Physical Performance Testing, 97110-Therapeutic exercises, 97530- Therapeutic activity, [id number]- Neuromuscular re-education, 97535- Self Care, 62130- Manual therapy, [id number]- Gait training, [phone]- Aquatic Therapy, [phone]- Electrical stimulation (unattended), [phone]- Electrical stimulation (manual), 97016- Vasopneumatic device, [id number]- Ionotophoresis 4mg /ml Dexamethasone , Patient/Family education, Balance training, Stair training, Taping, Joint mobilization, Cryotherapy, Moist heat, and Biofeedback  PLAN FOR NEXT SESSION: Assess response to HEP. NMES/Russian for VMO activation. Quad strengthening and hip abductor strengthening. Work on patellar/knee positioning/mechanics with squatting/stairs. Stretch ITB/lateral quads   Samuella Crocker, PTA 11/17/2023, 12:37 PM

## 2023-11-17 NOTE — Assessment & Plan Note (Signed)
 Patient has not met goal of at least 5% of body weight loss with comprehensive lifestyle modifications alone in the past 3-6 months. Pharmacotherapy is appropriate to pursue as augmentation. Will start Zepbound - patient has diagnosed OSA.    Confirmed patient not pregnant and no personal or family history of medullary thyroid  carcinoma (MTC) or Multiple Endocrine Neoplasia syndrome type 2 (MEN 2).   Advised patient on common side effects including nausea, diarrhea, dyspepsia, decreased appetite, and fatigue. Counseled patient on reducing meal size and how to titrate medication to minimize side effects. Patient aware to call if intolerable side effects or if experiencing dehydration, abdominal pain, or dizziness. Patient will adhere to dietary modifications and will target at least 150 minutes of moderate intensity exercise weekly.   Injection technique reviewed at today's visit.    Titration Plan:  Will plan to follow the titration plan as below, pending patient is tolerating each dose before increasing to the next. Can slow titration if needed for tolerability.    -Month 1: Inject 2.5 mg SQ once weekly x 4 weeks, then reach out to me via MyChart to determine need to go to next dose.  Follow up via MyChart.

## 2023-11-17 NOTE — Progress Notes (Signed)
 Office Visit    Patient Name: Destiny [name] Date of Encounter: 11/17/2023  Primary Care Provider:  Neda Balk, MD Primary Cardiologist:  Hazle Lites, MD  Chief Complaint    Weight management - OSA  Significant Past Medical History   HLD 12/24 LDL 105 on Repatha  (familial, was at 205 in 2020)  AF CHADS2-VASc = 4  preDM 4/25 A1c 6.2  CAD By CT  OSA Severe OSA per study    Allergies  Allergen Reactions   Diclofenac  Itching, Nausea Only and Other (See Comments)    Wheezing   Hydrochlorothiazide      Wheezing. Losing teeth.   Mirtazapine     Unknown reaction    Statins     myalgia    History of Present Illness    Destiny [name] is a 73 y.o. female patient of Dr Maximo Spar, in the office today to discuss options for weight management.  Patient notes that she has tried many different diet plan over the years with varied success.  Unfortunately as the diets stop the weight comes back on.  She did have a sleep study in 2024 which noted severe sleep apnea.    Previously tried meds: Weight Watchers in the 70's and 80's, did well, but now found harder to use.    Baseline weight/BMI: 238 lb // 42.17  Insurance payor: [employer]  Diet: stays away from processed foods, not as active and not as much hungry; breakfast and early supper; quinoa, oatmeal pancakes;   Exercise: limited by knee injury   Confirmed patient not pregnant and no personal or family history of medullary thyroid  carcinoma (MTC) or Multiple Endocrine Neoplasia syndrome type 2 (MEN 2).   Accessory Clinical Findings    Lab Results  Component Value Date   CREATININE 0.75 09/20/2023   BUN 15 09/20/2023   NA 138 09/20/2023   K 4.1 09/20/2023   CL 102 09/20/2023   CO2 28 09/20/2023   Lab Results  Component Value Date   ALT 34 09/20/2023   AST 27 09/20/2023   ALKPHOS 61 09/20/2023   BILITOT 0.5 09/20/2023   Lab Results  Component Value Date   HGBA1C 6.2 09/20/2023      Home  Medications/Allergies    Current Outpatient Medications  Medication Sig Dispense Refill   albuterol  (VENTOLIN  HFA) 108 (90 Base) MCG/ACT inhaler Inhale 2 puffs into the lungs every 6 (six) hours as needed for wheezing or shortness of breath. 18 g 5   Coenzyme Q10 (COQ-10 PO) Take 1 tablet by mouth daily.     ELIQUIS  5 MG TABS tablet TAKE ONE TABLET BY MOUTH TWICE A DAY 180 tablet 3   Evolocumab  (REPATHA  SURECLICK) 140 MG/ML SOAJ Inject 140 mg into the skin every 14 (fourteen) days. 6 mL 2   furosemide  (LASIX ) 40 MG tablet Take 1 tablet (40 mg total) by mouth daily. prn weight gain>3#/24 hours, SOB 90 tablet 3   irbesartan  (AVAPRO ) 150 MG tablet Take 1 tablet (150 mg total) by mouth daily. 90 tablet 1   metoprolol  succinate (TOPROL -XL) 25 MG 24 hr tablet Take 1 tablet (25 mg total) by mouth daily. Take with or immediately following a meal 90 tablet 1   nitroGLYCERIN  (NITROSTAT ) 0.4 MG SL tablet Place 1 tablet (0.4 mg total) under the tongue every 5 (five) minutes as needed for chest pain. 25 tablet 1   OVER THE COUNTER MEDICATION Take 3 tablets by mouth daily. Nutrifi rejuveniix supplement  OVER THE COUNTER MEDICATION Take 2 tablets by mouth daily. Nutrifi optimal-m     OVER THE COUNTER MEDICATION Take 2 tablets by mouth daily. Nutrifi optimal-v     thyroid  (ARMOUR THYROID ) 15 MG tablet Take 1 tablet (15 mg total) by mouth daily. Take with 120mg  daily 90 tablet 1   thyroid  (ARMOUR) 120 MG tablet Take 1 tablet (120 mg total) by mouth daily before breakfast. Take with 15 mg to equal 135 mg 90 tablet 1   Vibegron  (GEMTESA ) 75 MG TABS Take 1 tablet (75 mg total) by mouth daily. 90 tablet 3   Vitamin D , Ergocalciferol , (DRISDOL ) 1.25 MG (50000 UNIT) CAPS capsule TAKE ONE CAPSULE BY MOUTH EVERY SEVEN DAYS (MAY CONTAIN SOY OR PEANUT--TALK TO YOUR DOCTOR BEFORE TAKING IF YOU ARE ALLERGIC) 12 capsule 3   No current facility-administered medications for this visit.     Allergies  Allergen Reactions    Diclofenac  Itching, Nausea Only and Other (See Comments)    Wheezing   Hydrochlorothiazide      Wheezing. Losing teeth.   Mirtazapine     Unknown reaction    Statins     myalgia    Assessment & Plan    Morbid obesity (HCC) Patient has not met goal of at least 5% of body weight loss with comprehensive lifestyle modifications alone in the past 3-6 months. Pharmacotherapy is appropriate to pursue as augmentation. Will start Zepbound - patient has diagnosed OSA.    Confirmed patient not pregnant and no personal or family history of medullary thyroid  carcinoma (MTC) or Multiple Endocrine Neoplasia syndrome type 2 (MEN 2).   Advised patient on common side effects including nausea, diarrhea, dyspepsia, decreased appetite, and fatigue. Counseled patient on reducing meal size and how to titrate medication to minimize side effects. Patient aware to call if intolerable side effects or if experiencing dehydration, abdominal pain, or dizziness. Patient will adhere to dietary modifications and will target at least 150 minutes of moderate intensity exercise weekly.   Injection technique reviewed at today's visit.    Titration Plan:  Will plan to follow the titration plan as below, pending patient is tolerating each dose before increasing to the next. Can slow titration if needed for tolerability.    -Month 1: Inject 2.5 mg SQ once weekly x 4 weeks, then reach out to me via MyChart to determine need to go to next dose.  Follow up via MyChart.   Markesia Crilly PharmD CPP [facility]  [address] [place], [place] [address] [phone]

## 2023-11-18 ENCOUNTER — Encounter: Payer: Self-pay | Admitting: Urology

## 2023-11-18 ENCOUNTER — Ambulatory Visit: Admitting: Urology

## 2023-11-18 DIAGNOSIS — N3281 Overactive bladder: Secondary | ICD-10-CM | POA: Diagnosis not present

## 2023-11-18 DIAGNOSIS — R350 Frequency of micturition: Secondary | ICD-10-CM

## 2023-11-18 NOTE — Patient Instructions (Signed)
Tracking Your Bladder Symptoms   Patient Name:___________________________________________________  Week Starting:____________________________________   Day Daytime  Voids Nighttime  Voids Urgency for the Day (none, mild, strong, severe) Number of Accidents/ Leaks Beverages Comments  Monday        Tuesday        Wednesday        Thursday        Friday        Saturday        Sunday         This week my symptoms were:  O much better O better O the same O worse

## 2023-11-18 NOTE — Progress Notes (Signed)
 PTNS  Session # 5  Health & Social Factors: Change Caffeine: 2-3 Alcohol: 1 Daytime voids #per day: 9 Night-time voids #per night: 0-1 Urgency: Mild Incontinence Episodes #per day: 0 Ankle used: Right Treatment Setting: 13 Feeling/ Response: Sensory  Comments: Pt states she is having more volume each time she urinates and is able to empty her bladder better. Pt also states that she is having more warning time when the urge to urinate hits and therefore less accidents.   Performed By: C. Candee Cha, LPN

## 2023-11-22 DIAGNOSIS — E782 Mixed hyperlipidemia: Secondary | ICD-10-CM | POA: Diagnosis not present

## 2023-11-23 ENCOUNTER — Encounter: Payer: Self-pay | Admitting: Pharmacist Clinician (PhC)/ Clinical Pharmacy Specialist

## 2023-11-23 LAB — LIPID PANEL
Chol/HDL Ratio: 3 ratio (ref 0.0–4.4)
Cholesterol, Total: 125 mg/dL (ref 100–199)
HDL: 42 mg/dL (ref >39)
LDL Chol Calc (NIH): 52 mg/dL (ref 0–99)
Triglycerides: 188 mg/dL — ABNORMAL HIGH (ref 0–149)
VLDL Cholesterol Cal: 31 mg/dL (ref 5–40)

## 2023-11-24 ENCOUNTER — Ambulatory Visit (INDEPENDENT_AMBULATORY_CARE_PROVIDER_SITE_OTHER): Admitting: Urology

## 2023-11-24 ENCOUNTER — Telehealth: Payer: Self-pay | Admitting: Pharmacy Technician

## 2023-11-24 ENCOUNTER — Other Ambulatory Visit (HOSPITAL_COMMUNITY): Payer: Self-pay

## 2023-11-24 ENCOUNTER — Ambulatory Visit

## 2023-11-24 ENCOUNTER — Telehealth (HOSPITAL_BASED_OUTPATIENT_CLINIC_OR_DEPARTMENT_OTHER): Payer: Self-pay | Admitting: Pharmacist Clinician (PhC)/ Clinical Pharmacy Specialist

## 2023-11-24 DIAGNOSIS — R29898 Other symptoms and signs involving the musculoskeletal system: Secondary | ICD-10-CM

## 2023-11-24 DIAGNOSIS — R350 Frequency of micturition: Secondary | ICD-10-CM | POA: Diagnosis not present

## 2023-11-24 DIAGNOSIS — M6281 Muscle weakness (generalized): Secondary | ICD-10-CM

## 2023-11-24 DIAGNOSIS — G8929 Other chronic pain: Secondary | ICD-10-CM | POA: Diagnosis not present

## 2023-11-24 DIAGNOSIS — R2689 Other abnormalities of gait and mobility: Secondary | ICD-10-CM | POA: Diagnosis not present

## 2023-11-24 DIAGNOSIS — R278 Other lack of coordination: Secondary | ICD-10-CM | POA: Diagnosis not present

## 2023-11-24 DIAGNOSIS — M25561 Pain in right knee: Secondary | ICD-10-CM | POA: Diagnosis not present

## 2023-11-24 NOTE — Patient Instructions (Signed)
Tracking Your Bladder Symptoms   Patient Name:___________________________________________________  Week Starting:____________________________________   Day Daytime  Voids Nighttime  Voids Urgency for the Day (none, mild, strong, severe) Number of Accidents/ Leaks Beverages Comments  Monday        Tuesday        Wednesday        Thursday        Friday        Saturday        Sunday         This week my symptoms were:  O much better O better O the same O worse

## 2023-11-24 NOTE — Therapy (Signed)
 OUTPATIENT PHYSICAL THERAPY LOWER EXTREMITY TREATMENT   Patient Name: Destiny [name] MRN: [id number] DOB:11-Dec-1950, 73 y.o., female Today's Date: 11/24/2023  END OF SESSION:  PT End of Session - 11/24/23 1446     Visit Number 4    Number of Visits 12    Date for PT Re-Evaluation 12/20/23    Authorization Type [employer]    PT Start Time 1404    PT Stop Time 1446    PT Time Calculation (min) 42 min    Activity Tolerance Patient tolerated treatment well             Past Medical History:  Diagnosis Date   Abnormal blood chemistry    Arthropathy    Back pain    Breast cyst    Cellulitis    Chest discomfort    Chronic bronchitis (HCC)    Constipation    Disturbance of skin sensation    Edema    Encounter for screening and preventative care 06/17/2016   Fatty liver    GERD (gastroesophageal reflux disease)    Hiatal hernia with gastroesophageal reflux 06/17/2016   High risk medication use    Hoarseness 06/15/2016   Hyperglycemia    Hyperglycemia 12/21/2016   Hyperlipidemia    Hyperlipidemia, mixed 06/15/2016   Hypertension    Hypopotassemia    Hypothyroidism    Joint pain    Menopause    Obesity    Palpitations    Positive ANA (antinuclear antibody)    Pre-diabetes    Shoulder pain, right    Skin cancer    Leg   Sleep apnea    denies   SOB (shortness of breath)    Thyroid  disease 12/11/2014   Thyroid  nodule    Urinary incontinence 04/01/2017   Vitamin D  deficiency    Vitamin D  deficiency    Past Surgical History:  Procedure Laterality Date   BREAST CYST ASPIRATION Left    30 years ago  1970's or 1980's   CORONARY PRESSURE/FFR STUDY N/A 04/25/2018   Procedure: INTRAVASCULAR PRESSURE WIRE/FFR STUDY;  Surgeon: Lucendia Rusk, MD;  Location: [facility] INVASIVE CV LAB;  Service: Cardiovascular;  Laterality: N/A;   LEFT HEART CATH AND CORONARY ANGIOGRAPHY N/A 04/25/2018   Procedure: LEFT HEART CATH AND CORONARY ANGIOGRAPHY;  Surgeon: Lucendia Rusk,  MD;  Location: [facility] INVAS[facility] CV LAB;  Service: Cardiovascular;  Laterality: N/A;   LIPOSUCTION  1990   over gluteal area    TRANSESOPHAGEAL ECHOCARDIOGRAM (CATH LAB) N/A 08/12/2023   Procedure: TRANSESOPHAGEAL ECHOCARDIOGRAM;  Surgeon: Jann Melody, MD;  Location: [facility] INVASIVE CV LAB;  Service: Cardiovascular;  Laterality: N/A;   VEIN SURGERY Left 2022   WISDOM TOOTH EXTRACTION     and all upper teeth   Patient Active Problem List   Diagnosis Date Noted   Mitral annular calcification 08/12/2023   Urine frequency 01/14/2023   Muscle cramp 04/21/2022   Hair loss 04/21/2022   Osteopenia 04/21/2022   Fatty liver disease, nonalcoholic 04/15/2021   Family history of premature CAD 05/13/2018   CAD (coronary artery disease) 05/13/2018   Endometrium, hyperplasia 04/13/2018   Vitamin B12 deficiency 04/12/2018   Arthritis 04/12/2018   Constipation 02/10/2018   Abdominal pain 02/10/2018   Atypical chest pain 02/10/2018   Dyspepsia 02/10/2018   Mixed incontinence urge and stress 04/01/2017   Morbid obesity (HCC) 01/25/2017   Mixed hyperlipidemia 01/07/2017   Vitamin D  deficiency 01/07/2017   Hyperglycemia 12/21/2016   Plantar fasciitis 09/02/2016  Preventative health care 06/17/2016   Hiatal hernia with gastroesophageal reflux 06/17/2016   Hoarseness 06/15/2016   DDD (degenerative disc disease), cervical 05/04/2016   Foraminal stenosis of cervical region 05/04/2016   Neck pain 05/04/2016   Numbness and tingling in both hands 05/04/2016   Essential hypertension 03/26/2016   Hypothyroidism 12/11/2014    PCP: Neda Balk, MD  REFERRING PROVIDER: Syliva Even, MD  REFERRING DIAG: [phone] (ICD-10-CM) - Chronic pain of right knee  THERAPY DIAG:  Chronic pain of right knee  Muscle weakness (generalized)  Other lack of coordination  Other abnormalities of gait and mobility  Other symptoms and signs involving the musculoskeletal system  Rationale for  Evaluation and Treatment: Rehabilitation  ONSET DATE: ~1 year  SUBJECTIVE:   SUBJECTIVE STATEMENT: Pt reports improvement in pain, the tape did not stay on.   PERTINENT HISTORY: From 09/29/23 Sports Medicine H&P: R knee pain x 6-8 months, worsening recently. Has suffered a fall in the past, about 15 years ago, injuring the right knee. Pt locates pain to peri-patellar region. No recent imaging.   PAIN:  Are you having pain? Yes: NPRS scale: 0 currently, at worst 10 Pain location: R anterior knee Pain description: dull pain Aggravating factors: Cold weather, biking and walking >10 min, stairs Relieving factors: tylenol , sometimes icy/hot and biofreeze can help  PRECAUTIONS: None  RED FLAGS: None   WEIGHT BEARING RESTRICTIONS: No  FALLS:  Has patient fallen in last 6 months? 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  DIAGNOSTIC FINDINGS: 09/29/23 R knee x-ray IMPRESSION: 1. Minimal patellofemoral osteoarthritis. 2. Tiny joint effusion.  PATIENT SURVEYS:  LEFS  Extreme difficulty/unable (0), Quite a bit of difficulty (1), Moderate difficulty (2), Little difficulty (3), No difficulty (4) Survey date:  11/08/23  Any of your usual work, housework or school activities 2  2. Usual hobbies, recreational or sporting activities 1  3. Getting into/out of the bath 4  4. Walking between rooms 3  5. Putting on socks/shoes 4  6. Squatting  3  7. Lifting an object, like a bag of groceries from the floor 4  8. Performing light activities around your home 4  9. Performing heavy activities around your home  3  10. Getting into/out of a car 3  11. Walking 2 blocks 1  12. Walking 1 mile 0  13. Going up/down 10 stairs (1 flight) 1  14. Standing for 1 hour 2  15.  sitting for 1 hour 4  16. Running on even ground 1  17. Running on uneven ground 0  18. Making sharp turns while running fast 0  19. Hopping  0  20. Rolling over in bed 2  Score total:  42/80     COGNITION: Overall cognitive status: Within functional limits for tasks assessed     SENSATION: WFL  EDEMA:  Occasionally  MUSCLE LENGTH: Hamstrings: Right ~80 deg but turns externally; Left ~80 deg Thomas test: Right = Left  POSTURE: R patella laterally displaced vs L patella  PALPATION: TTP R patellar tendon Increased firing of vastus lateralis vs VMO on R during quad setting  LOWER EXTREMITY ROM:  Active  ROM Right eval Left eval  Hip flexion    Hip extension    Hip abduction    Hip adduction    Hip internal rotation prone 50 50  Hip external rotation prone 70 70  Knee flexion 115 120  Knee extension -5 0  Ankle dorsiflexion    Ankle plantarflexion    Ankle inversion    Ankle eversion     (Blank rows = not tested)  LOWER EXTREMITY MMT:  MMT Right eval Left eval  Hip flexion 4 5  Hip extension 4- 4  Hip abduction 3 3+  Hip adduction    Hip internal rotation    Hip external rotation    Knee flexion 5 5  Knee extension 4* 5  Ankle dorsiflexion    Ankle plantarflexion    Ankle inversion    Ankle eversion     (Blank rows = not tested, * = pain)  LOWER EXTREMITY SPECIAL TESTS:  Knee special tests: Patellafemoral grind test: positive   FUNCTIONAL TESTS:  Single leg balance: 6 sec on R, 7 sec on L Squat: increased knee flexion past toes, hips externally rotated  GAIT: Distance walked: Into clinic Assistive device utilized: None Level of assistance: Complete Independence Comments: feet externally rotated bilat                                                                                                                                 TREATMENT DATE:  11/17/23 Nustep L5x33min UE/LE KT tape encircling patella 2 I strips for patellar tracking/stabilization Leg ext 10lb 2x10 BLE Leg curls 15lb 2x10 BLE Leg press 15lb x 20 BLE 4 way hip strengthening RTB 2x10 B Squats x 10  11/17/23 Nustep L5x21min UE/LE KT tape encircling patella 2 I strips for patellar tracking/stabilization Mini squats 2x10; 3 sec hold Retro steps 2x15 with arm raise Toe raises back to counter x 15 Standing calf raises x15  Standing hip abduction x 10 BLE  11/10/23 Nustep L5x82min No arms NMES to R quads x 10 min 10/30  Mini squats 10x3 Retro step with arm raise x 20 SLR x 10  Hip abduction x 10  11/08/23 See HEP below Self care: bracing/taping for improved patellar alignment, NMES for improved VMO firing, and education below    PATIENT EDUCATION:  Education details: Exam findings, anatomy/physiology of condition, POC, initial HEP Person educated: Patient Education method: Explanation, Demonstration, and Handouts Education comprehension: verbalized understanding and needs further education  HOME EXERCISE PROGRAM: Access Code: [email or web address] URL: [email or web address] Date: 11/24/2023 Prepared by: Undrea Shipes  Exercises - Mini Squat with Counter Support  - 1 x daily - 7 x weekly - 2 sets - 10 reps - 3-5 sec hold - Retro Step  - 1 x daily - 7 x weekly - 3 sets - 10 reps - Toe Raise With Back Against Wall  - 1 x daily - 7 x weekly - 3 sets - 10 reps -  Standing Hip Abduction with Anchored Resistance  - 1 x daily - 7 x weekly - 2 sets - 10 reps - Standing Hip Extension with Anchored Resistance  - 1 x daily - 7 x weekly - 2 sets - 10 reps - Standing Hip Adduction with Anchored Resistance  - 1 x daily - 7 x weekly - 2 sets - 10 reps - Standing Hip Flexion with Anchored Resistance and Chair Support  - 1 x daily - 7 x weekly - 2 sets - 10 reps   ASSESSMENT:  CLINICAL IMPRESSION: Continued  working on motor recruitment and involuntary contraction of quads with proper movement/mechanics of knee joint while in standing. We applied KT tape to act as a patellar brace. Introduced more hip isolated strengthening in standing with TB resistance to promote independence with gym routine.  IE: Patient is a 73 y.o. F who was seen today for physical therapy evaluation and treatment for R knee pain. PMH significant for prior R knee injury but no past surgical history. 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 Assessment was significant for laterally displaced patella, increased vastus lateralis activation/shortening and increased R hip ER, weak R VMO and hip abductors causing patellar maltracking and increased patellar tendon inflammation. Pt will benefit from PT to improve on these deficits to increase her R LE instability and weakness for home and community tasks.   OBJECTIVE IMPAIRMENTS: Abnormal gait, decreased activity tolerance, decreased balance, decreased coordination, decreased endurance, decreased mobility, difficulty walking, decreased ROM, decreased strength, increased fascial restrictions, improper body mechanics, postural dysfunction, and pain.   ACTIVITY LIMITATIONS: carrying, lifting, standing, squatting, stairs, transfers, locomotion level, and caring for others  PARTICIPATION LIMITATIONS: meal prep, cleaning, community activity, and yard work  PERSONAL FACTORS: Age, Fitness, Past/current experiences, and Time since onset of injury/illness/exacerbation are also affecting patient's functional outcome.   REHAB POTENTIAL: Good  CLINICAL DECISION MAKING: Evolving/moderate complexity  EVALUATION COMPLEXITY: Moderate   GOALS: Goals reviewed with patient? Yes  SHORT TERM GOALS: Target date: 11/29/2023  Pt will be ind with initial HEP Baseline: Goal status: MET- 11/17/23  2.  Pt's patella will be </=2 cm from center of her tibiofemoral joint Baseline: ~4 cm from center Goal  status: INITIAL    LONG TERM GOALS: Target date: 12/20/2023   Pt will be ind with management and progression of HEP Baseline:  Goal status: INITIAL  2.  Pt will report >/=50% improvement of overall knee pain with activities Baseline:  Goal status: IN PROGRESS- 11/24/23  3.  Pt will be able to squat at least 25# with proper form for lifting/carrying tasks at home Baseline:  Goal status: INITIAL  4.  Pt will be able to ascend/descend her steps at home in a reciprocal pattern without pain to demo increased knee strength and stability Baseline:  Goal status: PROGRESS- 11/24/23  5.  Pt will have improved LEFS to >/=51/80 to demo MCID Baseline: 42 Goal status: INITIAL     PLAN:  PT FREQUENCY: 2x/week  PT DURATION: 6 weeks  PLANNED INTERVENTIONS: 97164- PT Re-evaluation, 97750- Physical Performance Testing, 97110-Therapeutic exercises, 97530- Therapeutic activity, [id number]- Neuromuscular re-education, 97535- Self Care, 16109- Manual therapy, [id number]- Gait training, [phone]- Aquatic Therapy, [phone]- Electrical stimulation (unattended), [phone]- Electrical stimulation (manual), 97016- Vasopneumatic device, [id number]- Ionotophoresis 4mg /ml Dexamethasone , Patient/Family education, Balance training, Stair training, Taping, Joint mobilization, Cryotherapy, Moist heat, and Biofeedback  PLAN FOR NEXT SESSION: Assess response to HEP. NMES/Russian for VMO activation. Quad strengthening and hip abductor strengthening. Work on  patellar/knee positioning/mechanics with squatting/stairs. Stretch ITB/lateral quads   Samuella Crocker, PTA 11/24/2023, 2:46 PM

## 2023-11-24 NOTE — Telephone Encounter (Signed)
 Pharmacy Patient Advocate Encounter  Received notification from [employer] that Prior Authorization for Zepbound has been DENIED.  Full denial letter will be uploaded to the media tab. See denial reason below.   PA #/Case ID/Reference #: [id number]

## 2023-11-24 NOTE — Telephone Encounter (Signed)
 Pharmacy Patient Advocate Encounter   Received notification from Pt Calls Messages that prior authorization for Zepbound 2.5 MG is required/requested.   Insurance verification completed.   The patient is insured through [employer] .   Per test claim: PA required; PA submitted to above mentioned insurance via CoverMyMeds Key/confirmation #/EOC [id number] Status is pending

## 2023-11-24 NOTE — Telephone Encounter (Signed)
 Please do PA for Zepbound - [facility]

## 2023-11-24 NOTE — Progress Notes (Signed)
 PTNS  Session # 6  Health & Social Factors: Change Caffeine: 1-2 Alcohol: 0 Daytime voids #per day: 7-10 Night-time voids #per night: 1-2 Urgency: Strong Incontinence Episodes #per day: 2 Ankle used: Left  Treatment Setting: 19 Feeling/ Response: Feeling Comments: Pt noted an increase in urgency and incontinence this past week  Performed By: C. Candee Cha, LPN

## 2023-11-25 ENCOUNTER — Encounter: Payer: Self-pay | Admitting: Family Medicine

## 2023-11-25 ENCOUNTER — Ambulatory Visit (INDEPENDENT_AMBULATORY_CARE_PROVIDER_SITE_OTHER): Admitting: Family Medicine

## 2023-11-25 VITALS — BP 122/84 | HR 70 | Ht 63.0 in | Wt 238.0 lb

## 2023-11-25 DIAGNOSIS — G8929 Other chronic pain: Secondary | ICD-10-CM | POA: Diagnosis not present

## 2023-11-25 DIAGNOSIS — M25561 Pain in right knee: Secondary | ICD-10-CM | POA: Diagnosis not present

## 2023-11-25 NOTE — Patient Instructions (Addendum)
 Thank you for coming in today.   Continue Physical Therapy.  See you back as needed.

## 2023-11-25 NOTE — Progress Notes (Signed)
   I, Miquel Amen, CMA acting as a scribe for [name], MD.  Destiny [name] is a 73 y.o. female who presents to [facility] today for f/u R knee pain. Pt was last seen by Dr. Alease Hunter on 09/29/23 and was advised to cont Tylenol , Voltaren  gel, and was referred to PT, completing 4 visits.  Today, pt reports some improvement of knee sx with PT and taping. Has not been using Voltaren  Gel. Notes that sx tend to be worse in the winter months. Feels like she is getting stronger. Compliant with HEP. Not needing Tylenol  more recently. Some continued swelling around the knee. Wonders if the patella is shifting. Denies mechanical sx.   Dx testing: 09/29/23 R knee XR 07/03/20 DEXA scan   Pertinent review of systems: Fevers or chills  Relevant historical information: Hypertension obesity   Exam:  BP 122/84   Pulse 70   Ht 5' 3 (1.6 m)   Wt 238 lb (108 kg)   SpO2 97%   BMI 42.16 kg/m  General: Well Developed, well nourished, and in no acute distress.   MSK: Right knee mild effusion otherwise normal-appearing Normal motion.    Lab and Radiology Results   EXAM: RIGHT KNEE 3 VIEWS   COMPARISON:  None Available.   FINDINGS: Normal bone mineralization. Minimal superior and inferior patellar degenerative spurring. Mild chronic enthesopathic change at the quadriceps insertion on the patella. Tiny joint effusion. The medial and lateral compartment joint spaces are maintained. No acute fracture is seen. No dislocation. Moderate atherosclerotic calcifications.   IMPRESSION: 1. Minimal patellofemoral osteoarthritis. 2. Tiny joint effusion.     Electronically Signed   By: Bertina Broccoli M.D.   On: 10/03/2023 18:33   I, [name], personally (independently) visualized and performed the interpretation of the images attached in this note.      Assessment and Plan: 73 y.o. female with right knee pain due to patellofemoral chondromalacia and  patellofemoral pain syndrome.  Significantly improving with physical therapy.  Plan to finish out PT and continue home exercise program and kinesiotaping.  Recommend Voltaren  gel.  Check back as needed.  Consider steroid injection in the future if not improving. Happy to reauthorize PT in the future if needed.  PDMP not reviewed this encounter. No orders of the defined types were placed in this encounter.  No orders of the defined types were placed in this encounter.    Discussed warning signs or symptoms. Please see discharge instructions. Patient expresses understanding.   The above documentation has been reviewed and is accurate and complete [name], M.D.

## 2023-11-26 ENCOUNTER — Ambulatory Visit (HOSPITAL_BASED_OUTPATIENT_CLINIC_OR_DEPARTMENT_OTHER): Payer: Self-pay | Admitting: Internal Medicine

## 2023-11-29 ENCOUNTER — Telehealth (HOSPITAL_BASED_OUTPATIENT_CLINIC_OR_DEPARTMENT_OTHER): Payer: Self-pay

## 2023-11-29 ENCOUNTER — Telehealth: Payer: Self-pay | Admitting: Pharmacy Technician

## 2023-11-29 ENCOUNTER — Other Ambulatory Visit (HOSPITAL_COMMUNITY): Payer: Self-pay

## 2023-11-29 ENCOUNTER — Ambulatory Visit

## 2023-11-29 DIAGNOSIS — M25561 Pain in right knee: Secondary | ICD-10-CM | POA: Diagnosis not present

## 2023-11-29 DIAGNOSIS — G8929 Other chronic pain: Secondary | ICD-10-CM

## 2023-11-29 DIAGNOSIS — M6281 Muscle weakness (generalized): Secondary | ICD-10-CM | POA: Diagnosis not present

## 2023-11-29 DIAGNOSIS — G4733 Obstructive sleep apnea (adult) (pediatric): Secondary | ICD-10-CM

## 2023-11-29 DIAGNOSIS — R29898 Other symptoms and signs involving the musculoskeletal system: Secondary | ICD-10-CM

## 2023-11-29 DIAGNOSIS — R2689 Other abnormalities of gait and mobility: Secondary | ICD-10-CM | POA: Diagnosis not present

## 2023-11-29 DIAGNOSIS — R278 Other lack of coordination: Secondary | ICD-10-CM

## 2023-11-29 NOTE — Therapy (Signed)
 OUTPATIENT PHYSICAL THERAPY LOWER EXTREMITY TREATMENT   Patient Name: Destiny [name] MRN: [id number] DOB:[birth year], 73 y.o., female Today's Date: 11/29/2023  END OF SESSION:  PT End of Session - 11/29/23 1543     Visit Number 5    Number of Visits 12    Date for PT Re-Evaluation 12/20/23    Authorization Type [employer]    PT Start Time 1536    PT Stop Time 1620    PT Time Calculation (min) 44 min    Activity Tolerance Patient tolerated treatment well              Past Medical History:  Diagnosis Date   Abnormal blood chemistry    Arthropathy    Back pain    Breast cyst    Cellulitis    Chest discomfort    Chronic bronchitis (HCC)    Constipation    Disturbance of skin sensation    Edema    Encounter for screening and preventative care 06/17/2016   Fatty liver    GERD (gastroesophageal reflux disease)    Hiatal hernia with gastroesophageal reflux 06/17/2016   High risk medication use    Hoarseness 06/15/2016   Hyperglycemia    Hyperglycemia 12/21/2016   Hyperlipidemia    Hyperlipidemia, mixed 06/15/2016   Hypertension    Hypopotassemia    Hypothyroidism    Joint pain    Menopause    Obesity    Palpitations    Positive ANA (antinuclear antibody)    Pre-diabetes    Shoulder pain, right    Skin cancer    Leg   Sleep apnea    denies   SOB (shortness of breath)    Thyroid  disease 12/11/2014   Thyroid  nodule    Urinary incontinence 04/01/2017   Vitamin D  deficiency    Vitamin D  deficiency    Past Surgical History:  Procedure Laterality Date   BREAST CYST ASPIRATION Left    30 years ago  1970's or 1980's   CORONARY PRESSURE/FFR STUDY N/A 04/25/2018   Procedure: INTRAVASCULAR PRESSURE WIRE/FFR STUDY;  Surgeon: Dann Candyce RAMAN, MD;  Location: [facility] INVASIVE CV LAB;  Service: Cardiovascular;  Laterality: N/A;   LEFT HEART CATH AND CORONARY ANGIOGRAPHY N/A 04/25/2018   Procedure: LEFT HEART CATH AND CORONARY ANGIOGRAPHY;  Surgeon: Dann Candyce RAMAN,  MD;  Location: [facility] INVASIVE CV LAB;  Service: Cardiovascular;  Laterality: N/A;   LIPOSUCTION  1990   over gluteal area    TRANSESOPHAGEAL ECHOCARDIOGRAM (CATH LAB) N/A 08/12/2023   Procedure: TRANSESOPHAGEAL ECHOCARDIOGRAM;  Surgeon: Santo Stanly DELENA, MD;  Location: [facility] INVASIVE CV LAB;  Service: Cardiovascular;  Laterality: N/A;   VEIN SURGERY Left 2022   WISDOM TOOTH EXTRACTION     and all upper teeth   Patient Active Problem List   Diagnosis Date Noted   Mitral annular calcification 08/12/2023   Urine frequency 01/14/2023   Muscle cramp 04/21/2022   Hair loss 04/21/2022   Osteopenia 04/21/2022   Fatty liver disease, nonalcoholic 04/15/2021   Family history of premature CAD 05/13/2018   CAD (coronary artery disease) 05/13/2018   Endometrium, hyperplasia 04/13/2018   Vitamin B12 deficiency 04/12/2018   Arthritis 04/12/2018   Constipation 02/10/2018   Abdominal pain 02/10/2018   Atypical chest pain 02/10/2018   Dyspepsia 02/10/2018   Mixed incontinence urge and stress 04/01/2017   Morbid obesity (HCC) 01/25/2017   Mixed hyperlipidemia 01/07/2017   Vitamin D  deficiency 01/07/2017   Hyperglycemia 12/21/2016   Plantar fasciitis 09/02/2016  Preventative health care 06/17/2016   Hiatal hernia with gastroesophageal reflux 06/17/2016   Hoarseness 06/15/2016   DDD (degenerative disc disease), cervical 05/04/2016   Foraminal stenosis of cervical region 05/04/2016   Neck pain 05/04/2016   Numbness and tingling in both hands 05/04/2016   Essential hypertension 03/26/2016   Hypothyroidism 12/11/2014    PCP: Domenica Harlene LABOR, MD  REFERRING PROVIDER: Joane Artist RAMAN, MD  REFERRING DIAG: [phone] (ICD-10-CM) - Chronic pain of right knee  THERAPY DIAG:  Chronic pain of right knee  Muscle weakness (generalized)  Other lack of coordination  Other abnormalities of gait and mobility  Other symptoms and signs involving the musculoskeletal system  Rationale for  Evaluation and Treatment: Rehabilitation  ONSET DATE: ~1 year  SUBJECTIVE:   SUBJECTIVE STATEMENT: MD pleased and will write in more PT if needed. Using a knee brace for stability   PERTINENT HISTORY: From 09/29/23 Sports Medicine H&P: R knee pain x 6-8 months, worsening recently. Has suffered a fall in the past, about 15 years ago, injuring the right knee. Pt locates pain to peri-patellar region. No recent imaging.   PAIN:  Are you having pain? Yes: NPRS scale: 0 currently, at worst 10 Pain location: R anterior knee Pain description: dull pain Aggravating factors: Cold weather, biking and walking >10 min, stairs Relieving factors: tylenol , sometimes icy/hot and biofreeze can help  PRECAUTIONS: None  RED FLAGS: None   WEIGHT BEARING RESTRICTIONS: No  FALLS:  Has patient fallen in last 6 months? 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  DIAGNOSTIC FINDINGS: 09/29/23 R knee x-ray IMPRESSION: 1. Minimal patellofemoral osteoarthritis. 2. Tiny joint effusion.  PATIENT SURVEYS:  LEFS  Extreme difficulty/unable (0), Quite a bit of difficulty (1), Moderate difficulty (2), Little difficulty (3), No difficulty (4) Survey date:  11/08/23  Any of your usual work, housework or school activities 2  2. Usual hobbies, recreational or sporting activities 1  3. Getting into/out of the bath 4  4. Walking between rooms 3  5. Putting on socks/shoes 4  6. Squatting  3  7. Lifting an object, like a bag of groceries from the floor 4  8. Performing light activities around your home 4  9. Performing heavy  activities around your home 3  10. Getting into/out of a car 3  11. Walking 2 blocks 1  12. Walking 1 mile 0  13. Going up/down 10 stairs (1 flight) 1  14. Standing for 1 hour 2  15.  sitting for 1 hour 4  16. Running on even ground 1  17. Running on uneven ground 0  18. Making sharp turns while running fast 0  19. Hopping  0  20. Rolling over in bed 2  Score total:  42/80     COGNITION: Overall cognitive status: Within functional limits for tasks assessed     SENSATION: WFL  EDEMA:  Occasionally  MUSCLE LENGTH: Hamstrings: Right ~80 deg but turns externally; Left ~80 deg Thomas test: Right = Left  POSTURE: R patella laterally displaced vs L patella  PALPATION: TTP R patellar tendon Increased firing of vastus lateralis vs VMO on R during quad setting  LOWER EXTREMITY ROM:  Active ROM Right eval Left eval  Hip flexion    Hip extension    Hip abduction    Hip adduction    Hip internal rotation prone 50 50  Hip external rotation prone 70 70  Knee flexion 115 120  Knee extension -5 0  Ankle dorsiflexion    Ankle plantarflexion    Ankle inversion    Ankle eversion     (Blank rows = not tested)  LOWER EXTREMITY MMT:  MMT Right eval Left eval  Hip flexion 4 5  Hip extension 4- 4  Hip abduction 3 3+  Hip adduction    Hip internal rotation    Hip external rotation    Knee flexion 5 5  Knee extension 4* 5  Ankle dorsiflexion    Ankle plantarflexion    Ankle inversion    Ankle eversion     (Blank rows = not tested, * = pain)  LOWER EXTREMITY SPECIAL TESTS:  Knee special tests: Patellafemoral grind test: positive   FUNCTIONAL TESTS:  Single leg balance: 6 sec on R, 7 sec on L Squat: increased knee flexion past toes, hips externally rotated  GAIT: Distance walked: Into clinic Assistive device utilized: None Level of assistance: Complete Independence Comments: feet externally rotated bilat                                                                                                                                 TREATMENT DATE:  11/29/23 Nustep L6x66min LE Squats 2x10 - cues for knees over toes 4 way hip strengthening GTB 2x10 Bilat Bridges 2x10 5 sec hold S/L clamshell RTB 2x10 B Education on KT tape for patellar tracking Tibia on femur mobs to increase flexion- decreased pain with end range flexion  11/17/23 Nustep L5x65min UE/LE KT tape encircling patella 2 I strips for patellar tracking/stabilization Leg ext 10lb 2x10 BLE Leg curls 15lb 2x10 BLE Leg press 15lb x 20 BLE 4 way hip strengthening RTB 2x10 B Squats x 10  11/17/23 Nustep L5x59min UE/LE KT tape encircling patella 2 I strips for patellar tracking/stabilization Mini squats 2x10; 3 sec hold Retro steps 2x15 with arm raise Toe raises back to counter x 15 Standing calf raises x15  Standing hip abduction x 10 BLE  11/10/23 Nustep L5x21min No arms NMES to R quads x 10 min 10/30  Mini squats 10x3 Retro step with arm raise x 20 SLR x 10  Hip abduction x 10  11/08/23 See HEP below Self care: bracing/taping for improved patellar alignment, NMES for improved VMO firing, and education below    PATIENT EDUCATION:  Education details: Exam findings, anatomy/physiology of condition, POC, initial HEP Person educated: Patient Education method: Explanation, Demonstration, and Handouts Education comprehension: verbalized understanding and needs further education  HOME EXERCISE PROGRAM: Access Code: [email or web address] URL: [email or web address] Date: 11/24/2023 Prepared by: Sanvika Cuttino  Exercises - Mini Squat with Counter Support  - 1 x  daily - 7 x weekly - 2 sets - 10 reps - 3-5 sec hold - Retro Step  - 1 x daily - 7 x weekly - 3 sets - 10 reps - Toe Raise With Back Against Wall  - 1 x daily - 7 x weekly - 3 sets - 10 reps - Standing Hip Abduction with Anchored Resistance  - 1 x daily - 7 x weekly - 2 sets - 10 reps - Standing Hip Extension with Anchored  Resistance  - 1 x daily - 7 x weekly - 2 sets - 10 reps - Standing Hip Adduction with Anchored Resistance  - 1 x daily - 7 x weekly - 2 sets - 10 reps - Standing Hip Flexion with Anchored Resistance and Chair Support  - 1 x daily - 7 x weekly - 2 sets - 10 reps   ASSESSMENT:  CLINICAL IMPRESSION: Pt noted a good report from visit with Dr. Joane last week. The plan according to MD note are to finish out PT then transition to HEP. Continued strengthening on LE's, incorporating more hip strengthening in standing to improve muscular co-contractions and motor control. Provided education on KT tape for self application. Good responses to treatment.    IE: Patient is a 73 y.o. F who was seen today for physical therapy evaluation and treatment for R knee pain. PMH significant for prior R knee injury but no past surgical history. 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 Assessment was significant for laterally displaced patella, increased vastus lateralis activation/shortening and increased R hip ER, weak R VMO and hip abductors causing patellar maltracking and increased patellar tendon inflammation. Pt will benefit from PT to improve on these deficits to increase her R LE instability and weakness for home and community tasks.   OBJECTIVE IMPAIRMENTS: Abnormal gait, decreased activity tolerance, decreased balance, decreased coordination, decreased endurance, decreased mobility, difficulty walking, decreased ROM, decreased strength, increased fascial restrictions, improper body mechanics, postural dysfunction, and pain.   ACTIVITY LIMITATIONS: carrying, lifting, standing, squatting, stairs, transfers, locomotion level, and caring for others  PARTICIPATION LIMITATIONS: meal prep, cleaning, community activity, and yard work  PERSONAL FACTORS: Age, Fitness, Past/current experiences, and Time since onset of injury/illness/exacerbation are also affecting patient's functional outcome.   REHAB POTENTIAL:  Good  CLINICAL DECISION MAKING: Evolving/moderate complexity  EVALUATION COMPLEXITY: Moderate   GOALS: Goals reviewed with patient? Yes  SHORT TERM GOALS: Target date: 11/29/2023  Pt will be ind with initial HEP Baseline: Goal status: MET- 11/17/23  2.  Pt's patella will be </=2 cm from center of her tibiofemoral joint Baseline: ~4 cm from center Goal status: INITIAL    LONG TERM GOALS: Target date: 12/20/2023   Pt will be ind with management and progression of HEP Baseline:  Goal status: INITIAL  2.  Pt will report >/=50% improvement of overall knee pain with activities Baseline:  Goal status: IN PROGRESS- 11/24/23  3.  Pt will be able to squat at least 25# with proper form for lifting/carrying tasks at home Baseline:  Goal status: INITIAL  4.  Pt will be able to ascend/descend her steps at home in a reciprocal pattern without pain to demo increased knee strength and stability Baseline:  Goal status: PROGRESS- 11/24/23  5.  Pt will have improved LEFS to >/=51/80 to demo MCID Baseline: 42 Goal status: INITIAL     PLAN:  PT FREQUENCY: 2x/week  PT DURATION: 6 weeks  PLANNED INTERVENTIONS: 97164- PT Re-evaluation, 97750- Physical Performance Testing, 97110-Therapeutic  exercises, 97530- Therapeutic activity, [id number]- Neuromuscular re-education, [phone]- Self Care, 02859- Manual therapy, [phone]- Gait training, [phone]- Aquatic Therapy, [phone]- Electrical stimulation (unattended), [phone]- Electrical stimulation (manual), 97016- Vasopneumatic device, [phone]- Ionotophoresis 4mg /ml Dexamethasone , Patient/Family education, Balance training, Stair training, Taping, Joint mobilization, Cryotherapy, Moist heat, and Biofeedback  PLAN FOR NEXT SESSION: Quad strengthening and hip abductor strengthening. Work on patellar/knee positioning/mechanics with squatting/stairs. Stretch ITB/lateral quads   Sol LITTIE Gaskins, PTA 11/29/2023, 4:34 PM

## 2023-11-29 NOTE — Telephone Encounter (Signed)
 [facility] is her primary.  Can you run it through them please

## 2023-11-29 NOTE — Telephone Encounter (Signed)
 Pharmacy Patient Advocate Encounter   Received notification from Pt Calls Messages that prior authorization for Zepbound 2.5MG  is required/requested.   Insurance verification completed.   The patient is insured through [facility] .   Per test claim: PA required; PA submitted to above mentioned insurance via Fax Key/confirmation #/EOC faxed Status is pending

## 2023-11-30 ENCOUNTER — Encounter: Payer: Self-pay | Admitting: Internal Medicine

## 2023-11-30 ENCOUNTER — Other Ambulatory Visit (HOSPITAL_COMMUNITY): Payer: Self-pay

## 2023-11-30 ENCOUNTER — Ambulatory Visit: Payer: Medicare PPO | Attending: Internal Medicine | Admitting: Internal Medicine

## 2023-11-30 VITALS — BP 128/76 | HR 66 | Ht 63.0 in | Wt 228.0 lb

## 2023-11-30 DIAGNOSIS — I2510 Atherosclerotic heart disease of native coronary artery without angina pectoris: Secondary | ICD-10-CM | POA: Diagnosis not present

## 2023-11-30 DIAGNOSIS — I3481 Nonrheumatic mitral (valve) annulus calcification: Secondary | ICD-10-CM

## 2023-11-30 DIAGNOSIS — I480 Paroxysmal atrial fibrillation: Secondary | ICD-10-CM

## 2023-11-30 DIAGNOSIS — E785 Hyperlipidemia, unspecified: Secondary | ICD-10-CM | POA: Diagnosis not present

## 2023-11-30 DIAGNOSIS — I10 Essential (primary) hypertension: Secondary | ICD-10-CM | POA: Diagnosis not present

## 2023-11-30 MED ORDER — ZEPBOUND 2.5 MG/0.5ML ~~LOC~~ SOAJ: 2.5000 mg | SUBCUTANEOUS | 0 refills | Status: DC

## 2023-11-30 NOTE — Patient Instructions (Addendum)
 Medication Instructions:  Your physician recommends that you continue on your current medications as directed. Please refer to the Current Medication list given to you today.  *If you need a refill on your cardiac medications before your next appointment, please call your pharmacy*   Follow-Up: At [facility], you and your health needs are our priority.  As part of our continuing mission to provide you with exceptional heart care, our providers are all part of one team.  This team includes your primary Cardiologist (physician) and Advanced Practice Providers or APPs (Physician Assistants and Nurse Practitioners) who all work together to provide you with the care you need, when you need it.  Your next appointment:   In 6 months with any APP   Other Instructions Referral to cardiovascular surgery Dr. Reyes Fruits MD for Caseous posterior mitral annular calcification

## 2023-11-30 NOTE — Progress Notes (Unsigned)
 LIPID CLINIC CONSULT NOTE  Chief Complaint:  Follow-up dyslipidemia  Primary Care Physician: Domenica Harlene LABOR, MD  Primary Cardiologist:  Vinie JAYSON Maxcy, MD  HPI:  Destiny [name] is a 73 y.o. female who is being seen today for the evaluation of dyslipidemia at the request of Domenica Harlene LABOR, MD. This is a pleasant 73 year old female with past medical history significant for moderate obesity, hypertension, dyslipidemia, hypothyroidism, palpitations and prior chest pain which led to heart catheterization in November 2019.  This demonstrated a 50% proximal circumflex lesion which was not significant by FFR, a small 75% ostial diagonal lesion as well as a mid LAD lesion of 25% stenosis.  LVEDP was elevated at 20 mmHg and LVEF was normal.  Based on these findings aggressive medical therapy was recommended.  At that time her chest pain seemed somewhat atypical and according to her still persists.  It feels like a twinge and is present at rest and with exertion.  She was however found to have a markedly abnormal lipid profile.  In addition she is struggled with longstanding hypothyroidism and recently had converted from Armour Thyroid  over to Synthroid .  Her dose was increased up to 100 mcg daily by her PCP in July.  Was a month ago which showed total cholesterol 263, HDL 39, triglycerides 216, and LDL 183.  He has had triglycerides over 400 in the past.  She reports her diet has been generally unhealthy and does eat a lot of fast food, fried foods and saturated fats.  Recently she started the 56day diet which she says is good so far and she feels like she would likely see a change in her lipids.  She also has a history of statin intolerance.  She has been tried on both rosuvastatin , atorvastatin, and ezetimibe, all of which have caused significant worsening of myalgias.  04/24/2019  Destiny [name] returns today for follow-up of dyslipidemia.  She successfully been using Repatha  which is both been affordable  and easy to use.  She has no significant side effects.  She has discontinued her red yeast rice.  Unfortunately she has not had repeat lipid testing.  We will plan to do that today since she is fasting.  She denies any side effects such as myalgia.  10/23/2019  Destiny [name] is seen today for follow-up.  She is done well on Repatha .  Total cholesterol now 128, triglycerides 158, HDL 44 and LDL 51.  She reports some occasional chest discomfort, not associated with exertion always or relieved by rest.  It is not limited her activities.  She has not required any nitro.  She has not had a new prescription in a while.  Her cardiologist Dr. Edwyna saw her last in 2019.  I would advise follow-up there.  12/06/2020  Destiny [name] is seen today in follow-up.  Overall she continues to do well on Repatha .  Recent lipids showed a slight increase in triglycerides however she had some difficulty with her thyroid  and was somewhat hypothyroid, now it has been repleted.  Total cholesterol 128, triglycerides 211, HDL 38 and LDL 56.  Overall she says she feels well denies chest pain or worsening shortness of breath.  05/07/2022  Destiny [name] returns today for follow-up.  She has had a significant improvement in her lipids on Repatha .  She is tolerating this quite well.  Total cholesterol now 130, triglycerides 84, HDL 54 and LDL 60.  She denies any chest pain or shortness of breath.  EKG today  is stable.  11/27/2022  Destiny [name] returns today for follow-up.  She had called in the office because her Apple Watch indicated an episode of atrial fibrillation.  She reports she has had episodes of palpitations going on for several years and decided to do an EKG with her Apple Watch at night because she felt strange.  This did show atrial fibrillation.  The overall duration was fairly short however given her age and comorbidities her CHA2DS2-VASc score is 4 and is increased risk of stroke.  EKG today shows she is back in sinus  rhythm.  05/24/2023  Destiny [name] is seen today in follow-up.  Since I last saw her she has had a lot of stress at home.  She was diagnosed with severe obstructive sleep apnea but has some trepidation about starting on therapy.  She says she might be willing to try it.  Her AHI was over 50 an hour.  Her cholesterol is gone up as well.  LDL is 105 now from 60.  She reports compliance with Repatha  but she says her diet has fallen apart.  She has taken a break from it.  Her A1c also was 5.9%.  Blood pressure is elevated today.  She says her thyroid  was also out of whack.  She intends to continue to work on this.  07/23/2023  Destiny [name] is seen today in follow-up.  She had recent echocardiography performed by her primary care provider in December for murmur.  Echo showed LVEF 70 to 75%, grade 1 diastolic dysfunction, there was a description of a cystic appearance of the posterior mitral annulus consistent with caseous mitral annular calcification.  There was mild mitral stenosis.  I discussed these findings with her today.  We scheduled this appointment as the echocardiographic abnormality needs further evaluation.  In review of the literature caseous mitral annular calcification can be associated with arrhythmias, increased risk for stroke and possibly endocarditis.  How to [place] manage it is actually debatable however there is some literature suggesting surgical resection and mitral valve reconstruction has been performed.  At the current time is not clear that she is symptomatic.  PMHx:  Past Medical History:  Diagnosis Date   Abnormal blood chemistry    Arthropathy    Back pain    Breast cyst    Cellulitis    Chest discomfort    Chronic bronchitis (HCC)    Constipation    Disturbance of skin sensation    Edema    Encounter for screening and preventative care 06/17/2016   Fatty liver    GERD (gastroesophageal reflux disease)    Hiatal hernia with gastroesophageal reflux 06/17/2016   High risk  medication use    Hoarseness 06/15/2016   Hyperglycemia    Hyperglycemia 12/21/2016   Hyperlipidemia    Hyperlipidemia, mixed 06/15/2016   Hypertension    Hypopotassemia    Hypothyroidism    Joint pain    Menopause    Obesity    Palpitations    Positive ANA (antinuclear antibody)    Pre-diabetes    Shoulder pain, right    Skin cancer    Leg   Sleep apnea    denies   SOB (shortness of breath)    Thyroid  disease 12/11/2014   Thyroid  nodule    Urinary incontinence 04/01/2017   Vitamin D  deficiency    Vitamin D  deficiency     Past Surgical History:  Procedure Laterality Date   BREAST CYST ASPIRATION Left    30 years  ago  1970's or 1980's   CORONARY PRESSURE/FFR STUDY N/A 04/25/2018   Procedure: INTRAVASCULAR PRESSURE WIRE/FFR STUDY;  Surgeon: Dann Candyce RAMAN, MD;  Location: [facility] INVASIVE CV LAB;  Service: Cardiovascular;  Laterality: N/A;   LEFT HEART CATH AND CORONARY ANGIOGRAPHY N/A 04/25/2018   Procedure: LEFT HEART CATH AND CORONARY ANGIOGRAPHY;  Surgeon: Dann Candyce RAMAN, MD;  Location: [facility] INVASIVE CV LAB;  Service: Cardiovascular;  Laterality: N/A;   LIPOSUCTION  1990   over gluteal area    TRANSESOPHAGEAL ECHOCARDIOGRAM (CATH LAB) N/A 08/12/2023   Procedure: TRANSESOPHAGEAL ECHOCARDIOGRAM;  Surgeon: Santo Stanly LABOR, MD;  Location: [facility] INVASIVE CV LAB;  Service: Cardiovascular;  Laterality: N/A;   VEIN SURGERY Left 2022   WISDOM TOOTH EXTRACTION     and all upper teeth    FAMHx:  Family History  Problem Relation Age of Onset   Irregular heart beat Mother    Heart disease Mother    Rheumatic fever Mother    Hypertension Father    Ulcers Father    Hyperlipidemia Father    Stroke Maternal Grandmother    Diabetes Maternal Grandmother    Hypertension Paternal Grandmother    Ulcers Paternal Grandfather     SOCHx:   reports that she quit smoking about 14 years ago. Her smoking use included cigarettes. She started smoking about 41 years ago. She has a 27  pack-year smoking history. She has never used smokeless tobacco. She reports current alcohol use. She reports that she does not use drugs.  ALLERGIES:  Allergies  Allergen Reactions   Diclofenac  Itching, Nausea Only and Other (See Comments)    Wheezing   Hydrochlorothiazide      Wheezing. Losing teeth.   Mirtazapine     Unknown reaction    Statins     myalgia    ROS: Pertinent items noted in HPI and remainder of comprehensive ROS otherwise negative.  HOME MEDS: Current Outpatient Medications on File Prior to Visit  Medication Sig Dispense Refill   albuterol  (VENTOLIN  HFA) 108 (90 Base) MCG/ACT inhaler Inhale 2 puffs into the lungs every 6 (six) hours as needed for wheezing or shortness of breath. 18 g 5   Coenzyme Q10 (COQ-10 PO) Take 1 tablet by mouth daily.     ELIQUIS  5 MG TABS tablet TAKE ONE TABLET BY MOUTH TWICE A DAY 180 tablet 3   Evolocumab  (REPATHA  SURECLICK) 140 MG/ML SOAJ Inject 140 mg into the skin every 14 (fourteen) days. 6 mL 2   furosemide  (LASIX ) 40 MG tablet Take 1 tablet (40 mg total) by mouth daily. prn weight gain>3#/24 hours, SOB 90 tablet 3   irbesartan  (AVAPRO ) 150 MG tablet Take 1 tablet (150 mg total) by mouth daily. 90 tablet 1   metoprolol  succinate (TOPROL -XL) 25 MG 24 hr tablet Take 1 tablet (25 mg total) by mouth daily. Take with or immediately following a meal 90 tablet 1   nitroGLYCERIN  (NITROSTAT ) 0.4 MG SL tablet Place 1 tablet (0.4 mg total) under the tongue every 5 (five) minutes as needed for chest pain. 25 tablet 1   OVER THE COUNTER MEDICATION Take 3 tablets by mouth daily. Nutrifi rejuveniix supplement     OVER THE COUNTER MEDICATION Take 2 tablets by mouth daily. Nutrifi optimal-m     OVER THE COUNTER MEDICATION Take 2 tablets by mouth daily. Nutrifi optimal-v     thyroid  (ARMOUR THYROID ) 15 MG tablet Take 1 tablet (15 mg total) by mouth daily. Take with 120mg  daily 90 tablet 1  thyroid  (ARMOUR) 120 MG tablet Take 1 tablet (120 mg total) by  mouth daily before breakfast. Take with 15 mg to equal 135 mg 90 tablet 1   Vibegron  (GEMTESA ) 75 MG TABS Take 1 tablet (75 mg total) by mouth daily. 90 tablet 3   Vitamin D , Ergocalciferol , (DRISDOL ) 1.25 MG (50000 UNIT) CAPS capsule TAKE ONE CAPSULE BY MOUTH EVERY SEVEN DAYS (MAY CONTAIN SOY OR PEANUT--TALK TO YOUR DOCTOR BEFORE TAKING IF YOU ARE ALLERGIC) 12 capsule 3   No current facility-administered medications on file prior to visit.    LABS/IMAGING: No results found for this or any previous visit (from the past 48 hours). No results found.  LIPID PANEL:    Component Value Date/Time   CHOL 125 11/22/2023 0958   TRIG 188 (H) 11/22/2023 0958   HDL 42 11/22/2023 0958   CHOLHDL 3.0 11/22/2023 0958   CHOLHDL 3 04/18/2020 1418   VLDL 47.4 (H) 04/18/2020 1418   LDLCALC 52 11/22/2023 0958   LDLCALC 183 (H) 12/08/2018 1531   LDLDIRECT 71.0 04/18/2020 1418    WEIGHTS: Wt Readings from Last 3 Encounters:  11/30/23 228 lb (103.4 kg)  11/25/23 238 lb (108 kg)  09/29/23 238 lb (108 kg)    VITALS: BP 128/76 (BP Location: Left Arm, Patient Position: Sitting, Cuff Size: Large)   Pulse 66   Ht 5' 3 (1.6 m)   Wt 228 lb (103.4 kg)   SpO2 95%   BMI 40.39 kg/m   EXAM: General appearance: alert and no distress Lungs: clear to auscultation bilaterally Heart: regular rate and rhythm, S1, S2 normal, and systolic murmur: early systolic 2/6, blowing at apex Extremities: extremities normal, atraumatic, no cyanosis or edema Neurologic: Grossly normal  EKG: Deferred  ASSESSMENT: Caseous posterior mitral annular calcification Paroxysmal atrial fibrillation-CHA2DS2-VASc score 4 Mixed dyslipidemia with elevated LDL and triglycerides Coronary artery disease with moderate to severe disease by cath (04/2018)-managed medically Morbid obesity Hypertension Palpitations Hypothyroidism Severe OSA  PLAN: 1.   Destiny [name] is noted to have caseous mitral annular calcification of the posterior  leaflet with a large mass measuring about 3 x 3 cm.  There is associated mitral stenosis and regurgitation.  In reviewing the literature since this is quite rare, it does appear that there are risk factors including stroke, arrhythmia, endocarditis and other associated diseases.  I believe the mitral annulus needs to be better evaluated.  Will try to schedule for TEE however since I am not available next week I will try to schedule with one of my partners as soon as possible.  Based on these findings, she may need CT surgical evaluation by Dr. Maryjane.    Informed Consent   Shared Decision Making/Informed Consent   The risks [esophageal damage, perforation (1:10,000 risk), bleeding, pharyngeal hematoma as well as other potential complications associated with conscious sedation including aspiration, arrhythmia, respiratory failure and death], benefits (treatment guidance and diagnostic support) and alternatives of a transesophageal echocardiogram were discussed in detail with Destiny [name] and she is willing to proceed.     Follow-up with me after.  Vinie KYM Maxcy, MD, [facility], FACP  [place]  [facility]  Medical Director of the Advanced Lipid Disorders &  Cardiovascular Risk Reduction Clinic Diplomate of the American Board of Clinical Lipidology Attending Cardiologist  Direct Dial: [phone]  Fax: [phone]  Website:  [email or web address]  Vinie BROCKS Nashea Chumney 11/30/2023, 3:17 PM

## 2023-11-30 NOTE — Telephone Encounter (Signed)
 Patient stopped by office after seeing Dr. Mona to give us  more info.  Her Rx coverage is through [facility]- which we were aware of. Typically  w/ [facility] we don't submit prior authorization. Just send Rx to [facility] by Mail w/ dx code. I did find on website that [facility] is now covering Zepbound for OSA. I sent rx w/ OSA dx and put AHI in comments. Pt will f/u with pharmacy to see if additional info is needed

## 2023-12-02 ENCOUNTER — Ambulatory Visit: Admitting: Urology

## 2023-12-02 ENCOUNTER — Encounter: Payer: Self-pay | Admitting: Urology

## 2023-12-02 DIAGNOSIS — N3281 Overactive bladder: Secondary | ICD-10-CM

## 2023-12-02 NOTE — Progress Notes (Signed)
 PTNS  Session # 7  Health & Social Factors: Change Caffeine: 2-3 Alcohol: 0 Daytime voids #per day: 5-7 Night-time voids #per night: 0-2 Urgency: Strong/Severe Incontinence Episodes #per day: 0 Ankle used: Right Treatment Setting: 19 Feeling/ Response: Feeling Comments: Pt has had great results with less day and night time voids  Performed By: C. Koleen, LPN

## 2023-12-02 NOTE — Patient Instructions (Signed)
Tracking Your Bladder Symptoms   Patient Name:___________________________________________________  Week Starting:____________________________________   Day Daytime  Voids Nighttime  Voids Urgency for the Day (none, mild, strong, severe) Number of Accidents/ Leaks Beverages Comments  Monday        Tuesday        Wednesday        Thursday        Friday        Saturday        Sunday         This week my symptoms were:  O much better O better O the same O worse

## 2023-12-03 ENCOUNTER — Ambulatory Visit

## 2023-12-06 ENCOUNTER — Ambulatory Visit: Admitting: Physical Therapy

## 2023-12-06 ENCOUNTER — Encounter: Payer: Self-pay | Admitting: Physical Therapy

## 2023-12-06 ENCOUNTER — Ambulatory Visit (INDEPENDENT_AMBULATORY_CARE_PROVIDER_SITE_OTHER): Admitting: Urology

## 2023-12-06 DIAGNOSIS — R278 Other lack of coordination: Secondary | ICD-10-CM

## 2023-12-06 DIAGNOSIS — R29898 Other symptoms and signs involving the musculoskeletal system: Secondary | ICD-10-CM | POA: Diagnosis not present

## 2023-12-06 DIAGNOSIS — G8929 Other chronic pain: Secondary | ICD-10-CM | POA: Diagnosis not present

## 2023-12-06 DIAGNOSIS — M6281 Muscle weakness (generalized): Secondary | ICD-10-CM

## 2023-12-06 DIAGNOSIS — N3281 Overactive bladder: Secondary | ICD-10-CM

## 2023-12-06 DIAGNOSIS — R2689 Other abnormalities of gait and mobility: Secondary | ICD-10-CM

## 2023-12-06 DIAGNOSIS — M25561 Pain in right knee: Secondary | ICD-10-CM | POA: Diagnosis not present

## 2023-12-06 NOTE — Patient Instructions (Signed)
Tracking Your Bladder Symptoms   Patient Name:___________________________________________________  Week Starting:____________________________________   Day Daytime  Voids Nighttime  Voids Urgency for the Day (none, mild, strong, severe) Number of Accidents/ Leaks Beverages Comments  Monday        Tuesday        Wednesday        Thursday        Friday        Saturday        Sunday         This week my symptoms were:  O much better O better O the same O worse

## 2023-12-06 NOTE — Progress Notes (Signed)
 PTNS  Session # 8  Health & Social Factors: Change Caffeine: 2-3 Alcohol: 0 Daytime voids #per day: 7 Night-time voids #per night: 0-1 Urgency: Mild Incontinence Episodes #per day: 1-2 Ankle used: Left Treatment Setting: 15 Feeling/ Response: Both  Comments: Pt has been having less urgency.  Performed By: C. Koleen, LPN

## 2023-12-06 NOTE — Therapy (Signed)
 OUTPATIENT PHYSICAL THERAPY LOWER EXTREMITY TREATMENT   Patient Name: Destiny [name] MRN: [id number] DOB:1950/08/03, 73 y.o., female Today's Date: 12/06/2023  END OF SESSION:  PT End of Session - 12/06/23 1400     Visit Number 6    Number of Visits 12    Date for PT Re-Evaluation 12/20/23    Authorization Type [employer]    PT Start Time 1400    PT Stop Time 1440    PT Time Calculation (min) 40 min    Activity Tolerance Patient tolerated treatment well              Past Medical History:  Diagnosis Date   Abnormal blood chemistry    Arthropathy    Back pain    Breast cyst    Cellulitis    Chest discomfort    Chronic bronchitis (HCC)    Constipation    Disturbance of skin sensation    Edema    Encounter for screening and preventative care 06/17/2016   Fatty liver    GERD (gastroesophageal reflux disease)    Hiatal hernia with gastroesophageal reflux 06/17/2016   High risk medication use    Hoarseness 06/15/2016   Hyperglycemia    Hyperglycemia 12/21/2016   Hyperlipidemia    Hyperlipidemia, mixed 06/15/2016   Hypertension    Hypopotassemia    Hypothyroidism    Joint pain    Menopause    Obesity    Palpitations    Positive ANA (antinuclear antibody)    Pre-diabetes    Shoulder pain, right    Skin cancer    Leg   Sleep apnea    denies   SOB (shortness of breath)    Thyroid  disease 12/11/2014   Thyroid  nodule    Urinary incontinence 04/01/2017   Vitamin D  deficiency    Vitamin D  deficiency    Past Surgical History:  Procedure Laterality Date   BREAST CYST ASPIRATION Left    30 years ago  1970's or 1980's   CORONARY PRESSURE/FFR STUDY N/A 04/25/2018   Procedure: INTRAVASCULAR PRESSURE WIRE/FFR STUDY;  Surgeon: Dann Candyce RAMAN, MD;  Location: [facility] INVASIVE CV [facility];  Service: Cardiovascular;  Laterality: N/A;   LEFT HEART CATH AND CORONARY ANGIOGRAPHY N/A 04/25/2018   Procedure: LEFT HEART CATH AND CORONARY ANGIOGRAPHY;  Surgeon: Dann Candyce RAMAN,  MD;  Location: [facility] INVASIVE CV LAB;  Service: Cardiovascular;  Laterality: N/A;   LIPOSUCTION  1990   over gluteal area    TRANSESOPHAGEAL ECHOCARDIOGRAM (CATH LAB) N/A 08/12/2023   Procedure: TRANSESOPHAGEAL ECHOCARDIOGRAM;  Surgeon: Santo Stanly DELENA, MD;  Location: [facility] INVASIVE CV [facility];  Service: Cardiovascular;  Laterality: N/A;   VEIN SURGERY Left 2022   WISDOM TOOTH EXTRACTION     and all upper teeth   Patient Active Problem List   Diagnosis Date Noted   Mitral annular calcification 08/12/2023   Urine frequency 01/14/2023   Muscle cramp 04/21/2022   Hair loss 04/21/2022   Osteopenia 04/21/2022   Fatty liver disease, nonalcoholic 04/15/2021   Family history of premature CAD 05/13/2018   CAD (coronary artery disease) 05/13/2018   Endometrium, hyperplasia 04/13/2018   Vitamin B12 deficiency 04/12/2018   Arthritis 04/12/2018   Constipation 02/10/2018   Abdominal pain 02/10/2018   Atypical chest pain 02/10/2018   Dyspepsia 02/10/2018   Mixed incontinence urge and stress 04/01/2017   Morbid obesity (HCC) 01/25/2017   Mixed hyperlipidemia 01/07/2017   Vitamin D  deficiency 01/07/2017   Hyperglycemia 12/21/2016   Plantar fasciitis 09/02/2016  Preventative health care 06/17/2016   Hiatal hernia with gastroesophageal reflux 06/17/2016   Hoarseness 06/15/2016   DDD (degenerative disc disease), cervical 05/04/2016   Foraminal stenosis of cervical region 05/04/2016   Neck pain 05/04/2016   Numbness and tingling in both hands 05/04/2016   Essential hypertension 03/26/2016   Hypothyroidism 12/11/2014    PCP: Domenica Harlene LABOR, MD  REFERRING PROVIDER: Joane Artist RAMAN, MD  REFERRING DIAG: [phone] (ICD-10-CM) - Chronic pain of right knee  THERAPY DIAG:  Chronic pain of right knee  Muscle weakness (generalized)  Other lack of coordination  Other abnormalities of gait and mobility  Other symptoms and signs involving the musculoskeletal system  Rationale for  Evaluation and Treatment: Rehabilitation  ONSET DATE: ~1 year  SUBJECTIVE:   SUBJECTIVE STATEMENT: It's getting better. I can feel it straightening. Pt states he can tell when she does not do her exercises that her foot starts to turn.    PERTINENT HISTORY: From 09/29/23 Sports Medicine H&P: R knee pain x 6-8 months, worsening recently. Has suffered a fall in the past, about 15 years ago, injuring the right knee. Pt locates pain to peri-patellar region. No recent imaging.   PAIN:  Are you having pain? Yes: NPRS scale: 0 currently, at worst 10 Pain location: R anterior knee Pain description: dull pain Aggravating factors: Cold weather, biking and walking >10 min, stairs Relieving factors: tylenol , sometimes icy/hot and biofreeze can help  PRECAUTIONS: None  RED FLAGS: None   WEIGHT BEARING RESTRICTIONS: No  FALLS:  Has patient fallen in last 6 months? 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  DIAGNOSTIC FINDINGS: 09/29/23 R knee x-ray IMPRESSION: 1. Minimal patellofemoral osteoarthritis. 2. Tiny joint effusion.  PATIENT SURVEYS:  LEFS  Extreme difficulty/unable (0), Quite a bit of difficulty (1), Moderate difficulty (2), Little difficulty (3), No difficulty (4) Survey date:  11/08/23  Any of your usual work, housework or school activities 2  2. Usual hobbies, recreational or sporting activities 1  3. Getting into/out of the bath 4  4. Walking between rooms 3  5. Putting on socks/shoes 4  6. Squatting  3  7. Lifting an object, like a bag of groceries from the floor 4  8. Performing  light activities around your home 4  9. Performing heavy activities around your home 3  10. Getting into/out of a car 3  11. Walking 2 blocks 1  12. Walking 1 mile 0  13. Going up/down 10 stairs (1 flight) 1  14. Standing for 1 hour 2  15.  sitting for 1 hour 4  16. Running on even ground 1  17. Running on uneven ground 0  18. Making sharp turns while running fast 0  19. Hopping  0  20. Rolling over in bed 2  Score total:  42/80     COGNITION: Overall cognitive status: Within functional limits for tasks assessed     SENSATION: WFL  EDEMA:  Occasionally  MUSCLE LENGTH: Hamstrings: Right ~80 deg but turns externally; Left ~80 deg Thomas test: Right = Left  POSTURE: R patella laterally displaced vs L patella  PALPATION: TTP R patellar tendon Increased firing  of vastus lateralis vs VMO on R during quad setting  LOWER EXTREMITY ROM:  Active ROM Right eval Left eval  Hip flexion    Hip extension    Hip abduction    Hip adduction    Hip internal rotation prone 50 50  Hip external rotation prone 70 70  Knee flexion 115 120  Knee extension -5 0  Ankle dorsiflexion    Ankle plantarflexion    Ankle inversion    Ankle eversion     (Blank rows = not tested)  LOWER EXTREMITY MMT:  MMT Right eval Left eval  Hip flexion 4 5  Hip extension 4- 4  Hip abduction 3 3+  Hip adduction    Hip internal rotation    Hip external rotation    Knee flexion 5 5  Knee extension 4* 5  Ankle dorsiflexion    Ankle plantarflexion    Ankle inversion    Ankle eversion     (Blank rows = not tested, * = pain)  LOWER EXTREMITY SPECIAL TESTS:  Knee special tests: Patellafemoral grind test: positive   FUNCTIONAL TESTS:  Single leg balance: 6 sec on R, 7 sec on L Squat: increased knee flexion past toes, hips externally rotated  GAIT: Distance walked: Into clinic Assistive device utilized: None Level of assistance: Complete Independence Comments: feet externally rotated  bilat                                                                                                                                TREATMENT DATE:  12/06/23 Nustep L6 x 6 min LEs only Squats 10# KB 2x10 Seated SLR 2x10 4 way hip strengthening GTB 2x10 3 way hip slider 2x10 Fwd runner's step up 2x10 6 step   11/29/23 Nustep L6x30min LE Squats 2x10 - cues for knees over toes 4 way hip strengthening GTB 2x10 Bilat Bridges 2x10 5 sec hold S/L clamshell RTB 2x10 B Education on KT tape for patellar tracking Tibia on femur mobs to increase flexion- decreased pain with end range flexion  11/17/23 Nustep L5x12min UE/LE KT tape encircling patella 2 I strips for patellar tracking/stabilization Leg ext 10lb 2x10 BLE Leg curls 15lb 2x10 BLE Leg press 15lb x 20 BLE 4 way hip strengthening RTB 2x10 B Squats x 10  11/17/23 Nustep L5x51min UE/LE KT tape encircling patella 2 I strips for patellar tracking/stabilization Mini squats 2x10; 3 sec hold Retro steps 2x15 with arm raise Toe raises back to counter x 15 Standing calf raises x15  Standing hip abduction x 10 BLE  11/10/23 Nustep L5x85min No arms NMES to R quads x 10 min 10/30  Mini squats 10x3 Retro step with arm raise x 20 SLR x 10  Hip abduction x 10   11/08/23 See HEP below Self care: bracing/taping for improved patellar alignment, NMES for improved VMO firing, and education below    PATIENT EDUCATION:  Education details: Exam findings, anatomy/physiology of condition,  POC, initial HEP Person educated: Patient Education method: Explanation, Demonstration, and Handouts Education comprehension: verbalized understanding and needs further education  HOME EXERCISE PROGRAM: Access Code: [email or web address] URL: [email or web address] Date: 11/24/2023 Prepared by: Sol Gaskins  Exercises - Mini Squat with Counter Support  - 1 x daily - 7 x weekly - 2 sets - 10 reps - 3-5 sec hold - Retro Step  - 1 x daily - 7 x weekly -  3 sets - 10 reps - Toe Raise With Back Against Wall  - 1 x daily - 7 x weekly - 3 sets - 10 reps - Standing Hip Abduction with Anchored Resistance  - 1 x daily - 7 x weekly - 2 sets - 10 reps - Standing Hip Extension with Anchored Resistance  - 1 x daily - 7 x weekly - 2 sets - 10 reps - Standing Hip Adduction with Anchored Resistance  - 1 x daily - 7 x weekly - 2 sets - 10 reps - Standing Hip Flexion with Anchored Resistance and Chair Support  - 1 x daily - 7 x weekly - 2 sets - 10 reps   ASSESSMENT:  CLINICAL IMPRESSION: Destiny [name] is demonstrating good improvements with her knee alignment. R patella is more centrally placed at rest. Able to obtain full knee extension without pain. LEFS goal has been met.    IE: Patient is a 73 y.o. F who was seen today for physical therapy evaluation and treatment for R knee pain. PMH significant for prior R knee injury but no past surgical history. 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 Assessment was significant for laterally displaced patella, increased vastus lateralis activation/shortening and increased R hip ER, weak R VMO and hip abductors causing patellar maltracking and increased patellar tendon inflammation. Pt will benefit from PT to improve on these deficits to increase her R LE instability and weakness for home and community tasks.   OBJECTIVE IMPAIRMENTS: Abnormal gait, decreased activity tolerance, decreased balance, decreased coordination, decreased endurance, decreased mobility, difficulty walking, decreased ROM, decreased strength, increased fascial restrictions, improper body mechanics, postural dysfunction, and pain.   ACTIVITY LIMITATIONS: carrying, lifting, standing, squatting, stairs, transfers, locomotion level, and caring for others  PARTICIPATION LIMITATIONS: meal prep, cleaning, community activity, and yard work  PERSONAL FACTORS: Age, Fitness, Past/current experiences, and Time since onset of injury/illness/exacerbation are  also affecting patient's functional outcome.   REHAB POTENTIAL: Good  CLINICAL DECISION MAKING: Evolving/moderate complexity  EVALUATION COMPLEXITY: Moderate   GOALS: Goals reviewed with patient? Yes  SHORT TERM GOALS: Target date: 11/29/2023  Pt will be ind with initial HEP Baseline: Goal status: MET- 11/17/23  2.  Pt's patella will be </=2 cm from center of her tibiofemoral joint Baseline: ~4 cm from center Goal status: MET 12/06/23    LONG TERM GOALS: Target date: 12/20/2023   Pt will be ind with management and progression of HEP Baseline:  Goal status: IN PROGRESS  2.  Pt will report >/=50% improvement of overall knee pain with activities Baseline:  Goal status: IN PROGRESS- 11/24/23  3.  Pt will be able to squat at least 25# with proper form for lifting/carrying tasks at home Baseline:  12/06/23: 10# KB squats performed today Goal status: IN PROGRESS 12/06/23  4.  Pt will be able to ascend/descend her steps at home in a reciprocal pattern without pain to demo increased knee strength and stability Baseline:  Goal status: IN PROGRESS- 11/24/23  5.  Pt will have improved LEFS to >/=51/80  to demo MCID Baseline: 42 Goal status: MET - 12/06/23  Lower Extremity Functional Score: 67 / 80 = 83.8 %     PLAN:  PT FREQUENCY: 2x/week  PT DURATION: 6 weeks  PLANNED INTERVENTIONS: 97164- PT Re-evaluation, 97750- Physical Performance Testing, 97110-Therapeutic exercises, 97530- Therapeutic activity, [id number]- Neuromuscular re-education, 97535- Self Care, [id number]- Manual therapy, [id number]- Gait training, [phone]- Aquatic Therapy, [phone]- Electrical stimulation (unattended), [phone]- Electrical stimulation (manual), 97016- Vasopneumatic device, [id number]- Ionotophoresis 4mg /ml Dexamethasone , Patient/Family education, Balance training, Stair training, Taping, Joint mobilization, Cryotherapy, Moist heat, and Biofeedback  PLAN FOR NEXT SESSION: Quad strengthening and hip abductor strengthening. Work  on patellar/knee positioning/mechanics with squatting/stairs. Stretch ITB/lateral quads   Azelie Noguera April Ma L Gerber Penza, PT 12/06/2023, 2:00 PM

## 2023-12-13 ENCOUNTER — Ambulatory Visit: Attending: Family Medicine | Admitting: Physical Therapy

## 2023-12-13 DIAGNOSIS — R2689 Other abnormalities of gait and mobility: Secondary | ICD-10-CM | POA: Diagnosis not present

## 2023-12-13 DIAGNOSIS — G8929 Other chronic pain: Secondary | ICD-10-CM | POA: Insufficient documentation

## 2023-12-13 DIAGNOSIS — R29898 Other symptoms and signs involving the musculoskeletal system: Secondary | ICD-10-CM | POA: Insufficient documentation

## 2023-12-13 DIAGNOSIS — M6281 Muscle weakness (generalized): Secondary | ICD-10-CM | POA: Diagnosis not present

## 2023-12-13 DIAGNOSIS — M25561 Pain in right knee: Secondary | ICD-10-CM | POA: Insufficient documentation

## 2023-12-13 DIAGNOSIS — R278 Other lack of coordination: Secondary | ICD-10-CM | POA: Diagnosis not present

## 2023-12-13 NOTE — Therapy (Addendum)
 OUTPATIENT PHYSICAL THERAPY LOWER EXTREMITY TREATMENT AND D/C  PHYSICAL THERAPY DISCHARGE SUMMARY  Visits from Start of Care: 7  Current functional level related to goals / functional outcomes: See below   Remaining deficits: See below. Pt has met all LTGs except squatting goals.    Education / Equipment: See below. Finalized HEP   Patient agrees to discharge. Patient goals were met. Patient is being discharged due to meeting the stated rehab goals.    Patient Name: ELZENA MUSTON MRN: [id number] DOB:[birth year], 73 y.o., female Today's Date: 12/13/2023  END OF SESSION:  PT End of Session - 12/13/23 1533     Visit Number 7    Number of Visits 12    Date for PT Re-Evaluation 12/20/23    Authorization Type [employer]    PT Start Time 1534    PT Stop Time 1612    PT Time Calculation (min) 38 min    Activity Tolerance Patient tolerated treatment well         Past Medical History:  Diagnosis Date   Abnormal blood chemistry    Arthropathy    Back pain    Breast cyst    Cellulitis    Chest discomfort    Chronic bronchitis (HCC)    Constipation    Disturbance of skin sensation    Edema    Encounter for screening and preventative care 06/17/2016   Fatty liver    GERD (gastroesophageal reflux disease)    Hiatal hernia with gastroesophageal reflux 06/17/2016   High risk medication use    Hoarseness 06/15/2016   Hyperglycemia    Hyperglycemia 12/21/2016   Hyperlipidemia    Hyperlipidemia, mixed 06/15/2016   Hypertension    Hypopotassemia    Hypothyroidism    Joint pain    Menopause    Obesity    Palpitations    Positive ANA (antinuclear antibody)    Pre-diabetes    Shoulder pain, right    Skin cancer    Leg   Sleep apnea    denies   SOB (shortness of breath)    Thyroid  disease 12/11/2014   Thyroid  nodule    Urinary incontinence 04/01/2017   Vitamin D  deficiency    Vitamin D  deficiency    Past Surgical History:  Procedure Laterality Date   BREAST CYST  ASPIRATION Left    30 years ago  1970's or 1980's   CORONARY PRESSURE/FFR STUDY N/A 04/25/2018   Procedure: INTRAVASCULAR PRESSURE WIRE/FFR STUDY;  Surgeon: Dann Candyce RAMAN, MD;  Location: [facility] INVASIVE CV LAB;  Service: Cardiovascular;  Laterality: N/A;   LEFT HEART CATH AND CORONARY ANGIOGRAPHY N/A 04/25/2018   Procedure: LEFT HEART CATH AND CORONARY ANGIOGRAPHY;  Surgeon: Dann Candyce RAMAN, MD;  Location: [facility] INVASIVE CV LAB;  Service: Cardiovascular;  Laterality: N/A;   LIPOSUCTION  1990   over gluteal area    TRANSESOPHAGEAL ECHOCARDIOGRAM (CATH LAB) N/A 08/12/2023   Procedure: TRANSESOPHAGEAL ECHOCARDIOGRAM;  Surgeon: Santo Stanly DELENA, MD;  Location: [facility] INVASIVE CV LAB;  Service: Cardiovascular;  Laterality: N/A;   VEIN SURGERY Left 2022   WISDOM TOOTH EXTRACTION     and all upper teeth   Patient Active Problem List   Diagnosis Date Noted   Mitral annular calcification 08/12/2023   Urine frequency 01/14/2023   Muscle cramp 04/21/2022   Hair loss 04/21/2022   Osteopenia 04/21/2022   Fatty liver disease, nonalcoholic 04/15/2021   Family history of premature CAD 05/13/2018   CAD (coronary artery disease) 05/13/2018  Endometrium, hyperplasia 04/13/2018   Vitamin B12 deficiency 04/12/2018   Arthritis 04/12/2018   Constipation 02/10/2018   Abdominal pain 02/10/2018   Atypical chest pain 02/10/2018   Dyspepsia 02/10/2018   Mixed incontinence urge and stress 04/01/2017   Morbid obesity (HCC) 01/25/2017   Mixed hyperlipidemia 01/07/2017   Vitamin D  deficiency 01/07/2017   Hyperglycemia 12/21/2016   Plantar fasciitis 09/02/2016   Preventative health care 06/17/2016   Hiatal hernia with gastroesophageal reflux 06/17/2016   Hoarseness 06/15/2016   DDD (degenerative disc disease), cervical 05/04/2016   Foraminal stenosis of cervical region 05/04/2016   Neck pain 05/04/2016   Numbness and tingling in both hands 05/04/2016   Essential hypertension 03/26/2016    Hypothyroidism 12/11/2014    PCP: Domenica Harlene LABOR, MD  REFERRING PROVIDER: Joane Artist RAMAN, MD  REFERRING DIAG: [phone] (ICD-10-CM) - Chronic pain of right knee  THERAPY DIAG:  Chronic pain of right knee  Muscle weakness (generalized)  Other lack of coordination  Other abnormalities of gait and mobility  Other symptoms and signs involving the musculoskeletal system  Rationale for Evaluation and Treatment: Rehabilitation  ONSET DATE: ~1 year  SUBJECTIVE:   SUBJECTIVE STATEMENT: Did a lot of walking on Friday. Had to use her brace.    PERTINENT HISTORY: From 09/29/23 Sports Medicine H&P: R knee pain x 6-8 months, worsening recently. Has suffered a fall in the past, about 15 years ago, injuring the right knee. Pt locates pain to peri-patellar region. No recent imaging.   PAIN:  Are you having pain? Yes: NPRS scale: 0 currently, at worst 4-5 Pain location: R anterior knee Pain description: dull pain Aggravating factors: Cold weather, biking and walking >10 min, stairs Relieving factors: tylenol , sometimes icy/hot and biofreeze can help  PRECAUTIONS: None  RED FLAGS: None   WEIGHT BEARING RESTRICTIONS: No  FALLS:  Has patient fallen in last 6 months? 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  DIAGNOSTIC FINDINGS: 09/29/23 R knee x-ray IMPRESSION: 1. Minimal patellofemoral osteoarthritis. 2. Tiny joint effusion.  PATIENT SURVEYS:  LEFS  Extreme difficulty/unable (0), Quite a bit of difficulty (1), Moderate difficulty (2), Little difficulty (3), No difficulty (4) Survey date:   11/08/23 12/13/23  Any of your usual work, housework or school activities 2 4  2. Usual hobbies, recreational or sporting activities 1 4  3. Getting into/out of the bath 4 4  4. Walking between rooms 3 4  5. Putting on socks/shoes 4 4  6. Squatting  3 3  7. Lifting an object, like a bag of groceries from the floor 4 4  8. Performing light activities around your home 4 4  9. Performing heavy activities around your home 3 4  10. Getting into/out of a car 3 4  11. Walking 2 blocks 1 1  12. Walking 1 mile 0 1  13. Going up/down 10 stairs (1 flight) 1 4  14. Standing for 1 hour 2 4  15.  sitting for 1 hour 4 4  16. Running on even ground 1 2  17. Running on uneven ground 0 1  18. Making sharp turns while running fast 0 2  19. Hopping  0 3  20. Rolling over in bed 2 4  Score total:  42/80 65/80     COGNITION: Overall cognitive status: Within functional limits for tasks assessed     SENSATION: WFL  EDEMA:  Occasionally  MUSCLE LENGTH: Hamstrings: Right ~80 deg but turns externally; Left ~80 deg Thomas test: Right = Left  POSTURE: R patella laterally displaced vs L patella  PALPATION: TTP R patellar tendon Increased firing of vastus lateralis vs VMO on R during quad setting  LOWER EXTREMITY ROM:  Active ROM Right eval Left eval  Hip flexion    Hip extension    Hip abduction    Hip adduction    Hip internal rotation prone 50 50  Hip external rotation prone 70 70  Knee flexion 115 120  Knee extension -5 0  Ankle dorsiflexion    Ankle plantarflexion    Ankle inversion    Ankle eversion     (Blank rows = not tested)  LOWER EXTREMITY MMT:  MMT Right eval Left eval  Hip flexion 4 5  Hip extension 4- 4  Hip abduction 3 3+  Hip adduction    Hip internal rotation    Hip external rotation    Knee flexion 5 5  Knee extension 4* 5  Ankle dorsiflexion    Ankle plantarflexion    Ankle inversion    Ankle eversion     (Blank rows = not tested, * = pain)  LOWER  EXTREMITY SPECIAL TESTS:  Knee special tests: Patellafemoral grind test: positive   FUNCTIONAL TESTS:  Single leg balance: 6 sec on R, 7 sec on L Squat: increased knee flexion past toes, hips externally rotated  GAIT: Distance walked: Into clinic Assistive device utilized: None Level of assistance: Complete Independence Comments: feet externally rotated bilat                                                                                                                                TREATMENT DATE:  12/13/23 Nustep L6 x 10 min LEs only while performing LEFS Leg press double leg 25# 2x10, single leg 20# 2x10 Side step green TB 2x10 Fwd/bwd monster walk green TB 2x10 Diver to counter 2x10 3 way hip slider 2x10   12/06/23 Nustep L6 x 6 min LEs only Squats 10# KB 2x10 Seated SLR 2x10 4 way hip strengthening GTB 2x10 3 way hip slider 2x10 Fwd runner's step up 2x10 6 step   11/29/23 Nustep L6x23min LE Squats 2x10 - cues for knees over toes 4 way hip strengthening GTB 2x10 Bilat Bridges 2x10 5 sec hold S/L clamshell RTB 2x10 B Education on KT tape for patellar tracking Tibia on femur mobs to increase flexion- decreased pain with end range flexion  11/17/23 Nustep L5x49min UE/LE KT tape encircling patella 2 I strips for patellar tracking/stabilization Leg ext 10lb 2x10 BLE Leg curls 15lb 2x10 BLE Leg press 15lb x 20 BLE 4 way hip strengthening  RTB 2x10 B Squats x 10  11/17/23 Nustep L5x33min UE/LE KT tape encircling patella 2 I strips for patellar tracking/stabilization Mini squats 2x10; 3 sec hold Retro steps 2x15 with arm raise Toe raises back to counter x 15 Standing calf raises x15  Standing hip abduction x 10 BLE   PATIENT EDUCATION:  Education details: Exam findings, anatomy/physiology of condition, POC, initial HEP Person educated: Patient Education method: Explanation, Demonstration, and Handouts Education comprehension: verbalized understanding and needs  further education  HOME EXERCISE PROGRAM: Access Code: [email or web address] URL: [email or web address] Date: 11/24/2023 Prepared by: Braylin Clark  Exercises - Mini Squat with Counter Support  - 1 x daily - 7 x weekly - 2 sets - 10 reps - 3-5 sec hold - Retro Step  - 1 x daily - 7 x weekly - 3 sets - 10 reps - Toe Raise With Back Against Wall  - 1 x daily - 7 x weekly - 3 sets - 10 reps - Standing Hip Abduction with Anchored Resistance  - 1 x daily - 7 x weekly - 2 sets - 10 reps - Standing Hip Extension with Anchored Resistance  - 1 x daily - 7 x weekly - 2 sets - 10 reps - Standing Hip Adduction with Anchored Resistance  - 1 x daily - 7 x weekly - 2 sets - 10 reps - Standing Hip Flexion with Anchored Resistance and Chair Support  - 1 x daily - 7 x weekly - 2 sets - 10 reps   ASSESSMENT:  CLINICAL IMPRESSION: Continued quad strengthening. 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 Pt has met all of her LTGs and feels ready to d/c to her HEP.    IE: Patient is a 73 y.o. F who was seen today for physical therapy evaluation and treatment for R knee pain. PMH significant for prior R knee injury but no past surgical history. 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 Assessment was significant for laterally displaced patella, increased vastus lateralis activation/shortening and increased R hip ER, weak R VMO and hip abductors causing patellar maltracking and increased patellar tendon inflammation. Pt will benefit from PT to improve on these deficits to increase her R LE instability and weakness for home and community tasks.   OBJECTIVE IMPAIRMENTS: Abnormal gait, decreased activity tolerance, decreased balance, decreased coordination, decreased endurance, decreased mobility, difficulty walking, decreased ROM, decreased strength, increased fascial restrictions, improper body mechanics, postural dysfunction, and pain.   ACTIVITY LIMITATIONS: carrying, lifting,  standing, squatting, stairs, transfers, locomotion level, and caring for others  PARTICIPATION LIMITATIONS: meal prep, cleaning, community activity, and yard work  PERSONAL FACTORS: Age, Fitness, Past/current experiences, and Time since onset of injury/illness/exacerbation are also affecting patient's functional outcome.   REHAB POTENTIAL: Good  CLINICAL DECISION MAKING: Evolving/moderate complexity  EVALUATION COMPLEXITY: Moderate   GOALS: Goals reviewed with patient? Yes  SHORT TERM GOALS: Target date: 11/29/2023  Pt will be ind with initial HEP Baseline: Goal status: MET- 11/17/23  2.  Pt's patella will be </=2 cm from center of her tibiofemoral joint Baseline: ~4 cm from center Goal status: MET 12/06/23    LONG TERM GOALS: Target date: 12/20/2023   Pt will be ind with management and progression of HEP Baseline:  Goal status: MET 12/13/23  2.  Pt will report >/=50% improvement of overall knee pain with activities Baseline:  Goal status: MET 80% improvement - 12/13/23  3.  Pt will be able to squat at least 25# with proper form  for lifting/carrying tasks at home Baseline:  12/06/23: 10# KB squats performed today Goal status: IN PROGRESS 12/13/23  4.  Pt will be able to ascend/descend her steps at home in a reciprocal pattern without pain to demo increased knee strength and stability Baseline:  IN PROGRESS- 11/24/23 Goal status: MET They don't bother me a bit 12/13/23  5.  Pt will have improved LEFS to >/=51/80 to demo MCID Baseline: 42 Goal status: MET - 12/06/23  Lower Extremity Functional Score: 67 / 80 = 83.8 %     PLAN:  PT FREQUENCY: 2x/week  PT DURATION: 6 weeks  PLANNED INTERVENTIONS: 97164- PT Re-evaluation, 97750- Physical Performance Testing, 97110-Therapeutic exercises, 97530- Therapeutic activity, [id number]- Neuromuscular re-education, 97535- Self Care, [id number]- Manual therapy, [id number]- Gait training, [phone]- Aquatic Therapy, [phone]- Electrical stimulation  (unattended), [phone]- Electrical stimulation (manual), 97016- Vasopneumatic device, [id number]- Ionotophoresis 4mg /ml Dexamethasone , Patient/Family education, Balance training, Stair training, Taping, Joint mobilization, Cryotherapy, Moist heat, and Biofeedback  PLAN FOR NEXT SESSION: Quad strengthening and hip abductor strengthening. Work on patellar/knee positioning/mechanics with squatting/stairs. Stretch ITB/lateral quads   Mechell Girgis April Ma L Juanjesus Pepperman, PT, DPT 12/13/2023, 3:34 PM

## 2023-12-15 ENCOUNTER — Encounter: Payer: Self-pay | Admitting: Internal Medicine

## 2023-12-15 ENCOUNTER — Encounter

## 2023-12-15 DIAGNOSIS — G4733 Obstructive sleep apnea (adult) (pediatric): Secondary | ICD-10-CM

## 2023-12-15 DIAGNOSIS — Z6841 Body Mass Index (BMI) 40.0 and over, adult: Secondary | ICD-10-CM

## 2023-12-15 MED ORDER — ZEPBOUND 5 MG/0.5ML ~~LOC~~ SOAJ: 5.0000 mg | SUBCUTANEOUS | 0 refills | Status: DC

## 2023-12-16 ENCOUNTER — Ambulatory Visit (INDEPENDENT_AMBULATORY_CARE_PROVIDER_SITE_OTHER): Admitting: Urology

## 2023-12-16 ENCOUNTER — Encounter: Payer: Self-pay | Admitting: Urology

## 2023-12-16 DIAGNOSIS — N3281 Overactive bladder: Secondary | ICD-10-CM

## 2023-12-16 NOTE — Progress Notes (Signed)
 PTNS  Session # 9  Health & Social Factors: Change Caffeine: 2-3 Alcohol: 0 Daytime voids #per day: 8-10 Night-time voids #per night: 0-1 Urgency: Mild/Strong Incontinence Episodes #per day: 0 Ankle used: Right Treatment Setting: 19 Feeling/ Response: Both Comments: Pt stated she is now able to sleep through the night without having to get up as much. She also has noticed a decreased in incontinence episodes.  Performed By: C.Koleen, LPN

## 2023-12-16 NOTE — Patient Instructions (Signed)
Tracking Your Bladder Symptoms   Patient Name:___________________________________________________  Week Starting:____________________________________   Day Daytime  Voids Nighttime  Voids Urgency for the Day (none, mild, strong, severe) Number of Accidents/ Leaks Beverages Comments  Monday        Tuesday        Wednesday        Thursday        Friday        Saturday        Sunday         This week my symptoms were:  O much better O better O the same O worse

## 2023-12-20 ENCOUNTER — Other Ambulatory Visit: Payer: Self-pay | Admitting: Internal Medicine

## 2023-12-23 ENCOUNTER — Ambulatory Visit (INDEPENDENT_AMBULATORY_CARE_PROVIDER_SITE_OTHER): Admitting: Urology

## 2023-12-23 ENCOUNTER — Encounter: Payer: Self-pay | Admitting: Urology

## 2023-12-23 DIAGNOSIS — N3281 Overactive bladder: Secondary | ICD-10-CM

## 2023-12-23 NOTE — Patient Instructions (Signed)
Tracking Your Bladder Symptoms   Patient Name:___________________________________________________  Week Starting:____________________________________   Day Daytime  Voids Nighttime  Voids Urgency for the Day (none, mild, strong, severe) Number of Accidents/ Leaks Beverages Comments  Monday        Tuesday        Wednesday        Thursday        Friday        Saturday        Sunday         This week my symptoms were:  O much better O better O the same O worse

## 2023-12-23 NOTE — Progress Notes (Signed)
 PTNS  Session # 11  Health & Social Factors: No Change  Caffeine: 2-3 Alcohol: 0 Daytime voids #per day: 7-9 Night-time voids #per night: 0-1 Urgency: MIld Incontinence Episodes #per day: 0 Ankle used: Left Treatment Setting: 19 Feeling/ Response: Both Comments:   Performed By: C. Koleen, LPN   Follow Up:

## 2023-12-29 ENCOUNTER — Ambulatory Visit: Admitting: Urology

## 2023-12-29 DIAGNOSIS — N3281 Overactive bladder: Secondary | ICD-10-CM | POA: Diagnosis not present

## 2023-12-29 NOTE — Progress Notes (Unsigned)
 PTNS  Session # 12  Health & Social Factors: no change Caffeine: 2-3 Alcohol: 0 Daytime voids #per day: 8 Night-time voids #per night: 0 Urgency: Mild Incontinence Episodes #per day: 0 Ankle used: Right Treatment Setting: 19 Feeling/ Response: Toe flex & sensory Comments: Patient notes improvement in her symptoms.  Performed By: Mitzie LPN

## 2023-12-30 ENCOUNTER — Ambulatory Visit: Admitting: Urology

## 2023-12-30 ENCOUNTER — Encounter: Payer: Self-pay | Admitting: Urology

## 2024-01-17 ENCOUNTER — Encounter: Payer: Self-pay | Admitting: Internal Medicine

## 2024-01-17 DIAGNOSIS — Z6841 Body Mass Index (BMI) 40.0 and over, adult: Secondary | ICD-10-CM

## 2024-01-18 MED ORDER — ZEPBOUND 7.5 MG/0.5ML ~~LOC~~ SOAJ: 7.5000 mg | SUBCUTANEOUS | 0 refills | Status: DC

## 2024-01-21 ENCOUNTER — Ambulatory Visit: Admitting: Urology

## 2024-01-21 NOTE — Progress Notes (Deleted)
 Assessment: 1. Mixed incontinence urge and stress   2. Overactive bladder     Plan: Continue Gemtesa . Continue maintenance PTNS monthly Return visit in 4 months  Chief Complaint: No chief complaint on file.   HPI: Destiny [name] is a 73 y.o. female who presents for continued evaluation of urinary incontinence.   At her initial visit in 10/22, she reported symptoms for approximately 7 years with gradual worsening.  She reported urinary frequency, voiding 3-4 times per hour, urgency, nocturia 1-2 times, urge incontinence and occasional unaware incontinence.  She reported some leakage with stress incontinence as well.  She also reported a decreased force of stream.  She had tried Kegel's in the past.  She was not using any pads.  No dysuria or gross hematuria.  No recent UTIs.  No prior medical therapy. PVR = 115 mL She was given a trial of Myrbetriq 50 mg daily. At her visit in November 2022, she reported no change in her symptoms with the Myrbetriq.  She continued to have urgency, frequency, and urge incontinence.  No dysuria or gross hematuria.   She was subsequently changed to Gemtesa  75 mg daily.  At her visit in August 2024, she continued on Gemtesa .  She reported some improvement in her urinary symptoms with decreased frequency, urgency, and decreased incontinence.  However, she continued to have some episodes of incontinence without awareness.  She was interested in pursuing PTNS.  This was approved through her insurance company but she did not follow-up with scheduling this.  At her visit in March 2025, she continued on Gemtesa .  She continued to report symptoms of frequency, urgency, and episodes of incontinence with urgency and without awareness.  She was using incontinence pads.  No dysuria or gross hematuria. She has completed 12 treatments of PTNS.  Her last treatment was given on 12/29/2023.  She has noted improvement in her symptoms.  Portions of the above documentation  were copied from a prior visit for review purposes only.  Allergies: Allergies  Allergen Reactions   Diclofenac  Itching, Nausea Only and Other (See Comments)    Wheezing   Hydrochlorothiazide      Wheezing. Losing teeth.   Mirtazapine     Unknown reaction    Statins     myalgia    PMH: Past Medical History:  Diagnosis Date   Abnormal blood chemistry    Arthropathy    Back pain    Breast cyst    Cellulitis    Chest discomfort    Chronic bronchitis (HCC)    Constipation    Disturbance of skin sensation    Edema    Encounter for screening and preventative care 06/17/2016   Fatty liver    GERD (gastroesophageal reflux disease)    Hiatal hernia with gastroesophageal reflux 06/17/2016   High risk medication use    Hoarseness 06/15/2016   Hyperglycemia    Hyperglycemia 12/21/2016   Hyperlipidemia    Hyperlipidemia, mixed 06/15/2016   Hypertension    Hypopotassemia    Hypothyroidism    Joint pain    Menopause    Obesity    Palpitations    Positive ANA (antinuclear antibody)    Pre-diabetes    Shoulder pain, right    Skin cancer    Leg   Sleep apnea    denies   SOB (shortness of breath)    Thyroid  disease 12/11/2014   Thyroid  nodule    Urinary incontinence 04/01/2017   Vitamin D  deficiency  Vitamin D  deficiency     PSH: Past Surgical History:  Procedure Laterality Date   BREAST CYST ASPIRATION Left    30 years ago  1970's or 1980's   CORONARY PRESSURE/FFR STUDY N/A 04/25/2018   Procedure: INTRAVASCULAR PRESSURE WIRE/FFR STUDY;  Surgeon: Dann Candyce RAMAN, MD;  Location: [facility] INVASIVE CV LAB;  Service: Cardiovascular;  Laterality: N/A;   LEFT HEART CATH AND CORONARY ANGIOGRAPHY N/A 04/25/2018   Procedure: LEFT HEART CATH AND CORONARY ANGIOGRAPHY;  Surgeon: Dann Candyce RAMAN, MD;  Location: [facility] INVASIVE CV LAB;  Service: Cardiovascular;  Laterality: N/A;   LIPOSUCTION  1990   over gluteal area    TRANSESOPHAGEAL ECHOCARDIOGRAM (CATH LAB) N/A 08/12/2023    Procedure: TRANSESOPHAGEAL ECHOCARDIOGRAM;  Surgeon: Santo Stanly LABOR, MD;  Location: [facility] INVASIVE CV LAB;  Service: Cardiovascular;  Laterality: N/A;   VEIN SURGERY Left 2022   WISDOM TOOTH EXTRACTION     and all upper teeth    SH: Social History   Tobacco Use   Smoking status: Former    Current packs/day: 0.00    Average packs/day: 1 pack/day for 27.0 years (27.0 ttl pk-yrs)    Types: Cigarettes    Start date: 3    Quit date: 2011    Years since quitting: 14.6   Smokeless tobacco: Never  Vaping Use   Vaping status: Never Used  Substance Use Topics   Alcohol use: Yes    Alcohol/week: 0.0 standard drinks of alcohol   Drug use: No    ROS: Constitutional:  Negative for fever, chills, weight loss CV: Negative for chest pain, previous MI, hypertension Respiratory:  Negative for shortness of breath, wheezing, sleep apnea, frequent cough GI:  Negative for nausea, vomiting, bloody stool, GERD  PE: There were no vitals taken for this visit. GENERAL APPEARANCE:  Well appearing, well developed, well nourished, NAD HEENT:  Atraumatic, normocephalic, oropharynx clear NECK:  Supple without lymphadenopathy or thyromegaly ABDOMEN:  Soft, non-tender, no masses EXTREMITIES:  Moves all extremities well, without clubbing, cyanosis, or edema NEUROLOGIC:  Alert and oriented x 3, normal gait, CN II-XII grossly intact MENTAL STATUS:  appropriate BACK:  Non-tender to palpation, No CVAT SKIN:  Warm, dry, and intact   Results: U/A:

## 2024-01-27 ENCOUNTER — Ambulatory Visit: Admitting: Urology

## 2024-01-27 NOTE — Progress Notes (Deleted)
 Assessment: 1. Overactive bladder   2. Mixed incontinence urge and stress      Plan: Continue Gemtesa . Continue maintenance PTNS monthly Return visit in 4 months  Chief Complaint: No chief complaint on file.   HPI: Destiny [name] is a 73 y.o. female who presents for continued evaluation of urinary incontinence.   At her initial visit in 10/22, she reported symptoms for approximately 7 years with gradual worsening.  She reported urinary frequency, voiding 3-4 times per hour, urgency, nocturia 1-2 times, urge incontinence and occasional unaware incontinence.  She reported some leakage with stress incontinence as well.  She also reported a decreased force of stream.  She had tried Kegel's in the past.  She was not using any pads.  No dysuria or gross hematuria.  No recent UTIs.  No prior medical therapy. PVR = 115 mL She was given a trial of Myrbetriq 50 mg daily. At her visit in November 2022, she reported no change in her symptoms with the Myrbetriq.  She continued to have urgency, frequency, and urge incontinence.  No dysuria or gross hematuria.   She was subsequently changed to Gemtesa  75 mg daily.  At her visit in August 2024, she continued on Gemtesa .  She reported some improvement in her urinary symptoms with decreased frequency, urgency, and decreased incontinence.  However, she continued to have some episodes of incontinence without awareness.  She was interested in pursuing PTNS.  This was approved through her insurance company but she did not follow-up with scheduling this.  At her visit in March 2025, she continued on Gemtesa .  She continued to report symptoms of frequency, urgency, and episodes of incontinence with urgency and without awareness.  She was using incontinence pads.  No dysuria or gross hematuria. She has completed 12 treatments of PTNS.  Her last treatment was given on 12/29/2023.  She has noted improvement in her symptoms.  Portions of the above documentation  were copied from a prior visit for review purposes only.  Allergies: Allergies  Allergen Reactions   Diclofenac  Itching, Nausea Only and Other (See Comments)    Wheezing   Hydrochlorothiazide      Wheezing. Losing teeth.   Mirtazapine     Unknown reaction    Statins     myalgia    PMH: Past Medical History:  Diagnosis Date   Abnormal blood chemistry    Arthropathy    Back pain    Breast cyst    Cellulitis    Chest discomfort    Chronic bronchitis (HCC)    Constipation    Disturbance of skin sensation    Edema    Encounter for screening and preventative care 06/17/2016   Fatty liver    GERD (gastroesophageal reflux disease)    Hiatal hernia with gastroesophageal reflux 06/17/2016   High risk medication use    Hoarseness 06/15/2016   Hyperglycemia    Hyperglycemia 12/21/2016   Hyperlipidemia    Hyperlipidemia, mixed 06/15/2016   Hypertension    Hypopotassemia    Hypothyroidism    Joint pain    Menopause    Obesity    Palpitations    Positive ANA (antinuclear antibody)    Pre-diabetes    Shoulder pain, right    Skin cancer    Leg   Sleep apnea    denies   SOB (shortness of breath)    Thyroid  disease 12/11/2014   Thyroid  nodule    Urinary incontinence 04/01/2017   Vitamin D  deficiency  Vitamin D  deficiency     PSH: Past Surgical History:  Procedure Laterality Date   BREAST CYST ASPIRATION Left    30 years ago  1970's or 1980's   CORONARY PRESSURE/FFR STUDY N/A 04/25/2018   Procedure: INTRAVASCULAR PRESSURE WIRE/FFR STUDY;  Surgeon: Dann Candyce RAMAN, MD;  Location[facility] INVASIVE CV LAB;  Service: Cardiovascular;  Laterality: N/A;   LEFT HEART CATH AND CORONARY ANGIOGRAPHY N/A 04/25/2018   Procedure: LEFT HEART CATH AND CORONARY ANGIOGRAPHY;  Surgeon: Dann Candyce RAMAN, MD;  Location: [facility] INVASIVE CV LAB;  Service: Cardiovascular;  Laterality: N/A;   LIPOSUCTION  1990   over gluteal area    TRANSESOPHAGEAL ECHOCARDIOGRAM (CATH LAB) N/A 08/12/2023    Procedure: TRANSESOPHAGEAL ECHOCARDIOGRAM;  Surgeon: Santo Stanly LABOR, MD;  Location: [facility] INVASIVE CV LAB;  Service: Cardiovascular;  Laterality: N/A;   VEIN SURGERY Left 2022   WISDOM TOOTH EXTRACTION     and all upper teeth    SH: Social History   Tobacco Use   Smoking status: Former    Current packs/day: 0.00    Average packs/day: 1 pack/day for 27.0 years (27.0 ttl pk-yrs)    Types: Cigarettes    Start date: 80    Quit date: 2011    Years since quitting: 14.6   Smokeless tobacco: Never  Vaping Use   Vaping status: Never Used  Substance Use Topics   Alcohol use: Yes    Alcohol/week: 0.0 standard drinks of alcohol   Drug use: No    ROS: Constitutional:  Negative for fever, chills, weight loss CV: Negative for chest pain, previous MI, hypertension Respiratory:  Negative for shortness of breath, wheezing, sleep apnea, frequent cough GI:  Negative for nausea, vomiting, bloody stool, GERD  PE: There were no vitals taken for this visit. GENERAL APPEARANCE:  Well appearing, well developed, well nourished, NAD HEENT:  Atraumatic, normocephalic, oropharynx clear NECK:  Supple without lymphadenopathy or thyromegaly ABDOMEN:  Soft, non-tender, no masses EXTREMITIES:  Moves all extremities well, without clubbing, cyanosis, or edema NEUROLOGIC:  Alert and oriented x 3, normal gait, CN II-XII grossly intact MENTAL STATUS:  appropriate BACK:  Non-tender to palpation, No CVAT SKIN:  Warm, dry, and intact   Results: U/A:

## 2024-02-21 ENCOUNTER — Encounter: Payer: Self-pay | Admitting: Internal Medicine

## 2024-02-21 MED ORDER — ZEPBOUND 10 MG/0.5ML ~~LOC~~ SOAJ: 10.0000 mg | SUBCUTANEOUS | 0 refills | Status: DC

## 2024-02-22 DIAGNOSIS — E039 Hypothyroidism, unspecified: Secondary | ICD-10-CM | POA: Diagnosis not present

## 2024-02-28 DIAGNOSIS — I509 Heart failure, unspecified: Secondary | ICD-10-CM | POA: Diagnosis not present

## 2024-02-28 DIAGNOSIS — I3481 Nonrheumatic mitral (valve) annulus calcification: Secondary | ICD-10-CM | POA: Diagnosis not present

## 2024-02-28 DIAGNOSIS — I110 Hypertensive heart disease with heart failure: Secondary | ICD-10-CM | POA: Diagnosis not present

## 2024-02-28 DIAGNOSIS — Z7901 Long term (current) use of anticoagulants: Secondary | ICD-10-CM | POA: Diagnosis not present

## 2024-02-28 DIAGNOSIS — I4891 Unspecified atrial fibrillation: Secondary | ICD-10-CM | POA: Diagnosis not present

## 2024-02-28 DIAGNOSIS — Z79899 Other long term (current) drug therapy: Secondary | ICD-10-CM | POA: Diagnosis not present

## 2024-02-28 DIAGNOSIS — I2510 Atherosclerotic heart disease of native coronary artery without angina pectoris: Secondary | ICD-10-CM | POA: Diagnosis not present

## 2024-02-28 DIAGNOSIS — I517 Cardiomegaly: Secondary | ICD-10-CM | POA: Diagnosis not present

## 2024-03-07 ENCOUNTER — Encounter: Payer: Self-pay | Admitting: Family Medicine

## 2024-03-07 ENCOUNTER — Ambulatory Visit (INDEPENDENT_AMBULATORY_CARE_PROVIDER_SITE_OTHER): Admitting: Family Medicine

## 2024-03-07 VITALS — BP 130/74 | HR 88 | Temp 96.0°F | Resp 16 | Ht 63.0 in | Wt 221.0 lb

## 2024-03-07 DIAGNOSIS — J309 Allergic rhinitis, unspecified: Secondary | ICD-10-CM | POA: Diagnosis not present

## 2024-03-07 MED ORDER — METHYLPREDNISOLONE ACETATE 80 MG/ML IJ SUSP
80.0000 mg | Freq: Once | INTRAMUSCULAR | Status: AC
  Administered 2024-03-07: 80 mg via INTRAMUSCULAR

## 2024-03-07 NOTE — Patient Instructions (Signed)
 Continue to push fluids, practice good hand hygiene, and cover your mouth if you cough. ? ?If you start having fevers, shaking or shortness of breath, seek immediate care. ? ?OK to take Tylenol 1000 mg (2 extra strength tabs) or 975 mg (3 regular strength tabs) every 6 hours as needed. ? ?Let us know if you need anything. ?

## 2024-03-07 NOTE — Progress Notes (Signed)
 CC: URI complaints  Destiny [name] here for URI complaints.  Duration: 2 days  Associated symptoms: rhinorrhea, sore throat, and coughing Denies: sinus congestion, ear pain, ear drainage, wheezing, new shortness of breath, myalgia, and fevers Treatment to date: none Sick contacts: Yes; massage therapist dx'd w COVID.   Past Medical History:  Diagnosis Date   Abnormal blood chemistry    Arthropathy    Back pain    Breast cyst    Cellulitis    Chest discomfort    Chronic bronchitis (HCC)    Constipation    Disturbance of skin sensation    Edema    Encounter for screening and preventative care 06/17/2016   Fatty liver    GERD (gastroesophageal reflux disease)    Hiatal hernia with gastroesophageal reflux 06/17/2016   High risk medication use    Hoarseness 06/15/2016   Hyperglycemia    Hyperglycemia 12/21/2016   Hyperlipidemia    Hyperlipidemia, mixed 06/15/2016   Hypertension    Hypopotassemia    Hypothyroidism    Joint pain    Menopause    Obesity    Palpitations    Positive ANA (antinuclear antibody)    Pre-diabetes    Shoulder pain, right    Skin cancer    Leg   Sleep apnea    denies   SOB (shortness of breath)    Thyroid  disease 12/11/2014   Thyroid  nodule    Urinary incontinence 04/01/2017   Vitamin D  deficiency    Vitamin D  deficiency     Objective BP 130/74 (BP Location: Left Arm, Patient Position: Sitting)   Pulse 88   Temp (!) 96 F (35.6 C) (Oral)   Resp 16   Ht 5' 3 (1.6 m)   Wt 221 lb (100.2 kg)   SpO2 98%   BMI 39.15 kg/m  General: Awake, alert, appears stated age HEENT: AT, [place], ears patent b/l and TM's neg, nares patent w clear discharge, pharynx pink and without exudates, MMM, mild ttp over L max sinus Neck: No masses or asymmetry Heart: RRR Lungs: CTAB, no accessory muscle use Psych: Age appropriate judgment and insight, normal mood and affect  Allergic rhinitis, unspecified seasonality, unspecified trigger  COVID, flu and strep testing  neg. Depo 80 mg IM today. Continue to push fluids, practice good hand hygiene, cover mouth when coughing. F/u prn. If starting to experience fevers, shaking, or new shortness of breath, seek immediate care. Pt voiced understanding and agreement to the plan.  Mabel [place], DO 03/07/24 2:34 PM

## 2024-03-08 NOTE — Progress Notes (Signed)
 Destiny [name]                                          MRN: [id number]   03/08/2024   The [facility] Quality Team Specialist reviewed this patient medical record for the purposes of chart review for care gap closure. The following were reviewed: chart review for care gap closure-kidney health evaluation for diabetes:eGFR  and uACR. Need uACr, appt 04/27/2024    [facility] Quality Team

## 2024-03-20 MED ORDER — ZEPBOUND 12.5 MG/0.5ML ~~LOC~~ SOAJ: 12.5000 mg | SUBCUTANEOUS | 0 refills | Status: DC

## 2024-03-20 NOTE — Addendum Note (Signed)
 Addended by: DARRELL BRUCKNER on: 03/20/2024 09:20 AM   Modules accepted: Orders

## 2024-04-17 MED ORDER — ZEPBOUND 15 MG/0.5ML ~~LOC~~ SOAJ: 15.0000 mg | SUBCUTANEOUS | 5 refills | Status: AC

## 2024-04-17 NOTE — Addendum Note (Signed)
 Addended by: DARRELL BRUCKNER on: 04/17/2024 09:28 AM   Modules accepted: Orders

## 2024-04-25 ENCOUNTER — Other Ambulatory Visit: Payer: Self-pay | Admitting: Internal Medicine

## 2024-04-25 DIAGNOSIS — I2510 Atherosclerotic heart disease of native coronary artery without angina pectoris: Secondary | ICD-10-CM

## 2024-04-25 DIAGNOSIS — E785 Hyperlipidemia, unspecified: Secondary | ICD-10-CM

## 2024-04-27 ENCOUNTER — Ambulatory Visit: Admitting: Student

## 2024-04-27 ENCOUNTER — Ambulatory Visit: Admitting: Family Medicine

## 2024-05-09 ENCOUNTER — Encounter: Payer: Self-pay | Admitting: Urology

## 2024-05-09 ENCOUNTER — Other Ambulatory Visit: Payer: Self-pay | Admitting: Urology

## 2024-05-09 ENCOUNTER — Encounter: Payer: Self-pay | Admitting: Family Medicine

## 2024-05-09 DIAGNOSIS — I10 Essential (primary) hypertension: Secondary | ICD-10-CM

## 2024-05-09 DIAGNOSIS — N3946 Mixed incontinence: Secondary | ICD-10-CM

## 2024-05-09 MED ORDER — IRBESARTAN 150 MG PO TABS: 150.0000 mg | ORAL_TABLET | Freq: Every day | ORAL | 0 refills | Status: DC

## 2024-05-09 MED ORDER — GEMTESA 75 MG PO TABS: 75.0000 mg | ORAL_TABLET | Freq: Every day | ORAL | 3 refills | Status: AC

## 2024-05-09 MED ORDER — THYROID 120 MG PO TABS: 120.0000 mg | ORAL_TABLET | Freq: Every day | ORAL | 0 refills | Status: DC

## 2024-05-09 MED ORDER — METOPROLOL SUCCINATE ER 25 MG PO TB24: 25.0000 mg | ORAL_TABLET | Freq: Every day | ORAL | 0 refills | Status: DC

## 2024-05-09 MED ORDER — THYROID 15 MG PO TABS: 15.0000 mg | ORAL_TABLET | Freq: Every day | ORAL | 0 refills | Status: DC

## 2024-05-10 DIAGNOSIS — I872 Venous insufficiency (chronic) (peripheral): Secondary | ICD-10-CM | POA: Diagnosis not present

## 2024-05-10 DIAGNOSIS — R600 Localized edema: Secondary | ICD-10-CM | POA: Diagnosis not present

## 2024-05-10 DIAGNOSIS — R252 Cramp and spasm: Secondary | ICD-10-CM | POA: Diagnosis not present

## 2024-05-10 DIAGNOSIS — I83893 Varicose veins of bilateral lower extremities with other complications: Secondary | ICD-10-CM | POA: Diagnosis not present

## 2024-05-23 ENCOUNTER — Encounter: Payer: Self-pay | Admitting: Internal Medicine

## 2024-05-23 ENCOUNTER — Ambulatory Visit: Admitting: General Practice

## 2024-05-23 ENCOUNTER — Ambulatory Visit: Attending: Internal Medicine | Admitting: Internal Medicine

## 2024-05-23 VITALS — BP 102/63 | HR 86 | Ht 63.0 in | Wt 207.2 lb

## 2024-05-23 DIAGNOSIS — E785 Hyperlipidemia, unspecified: Secondary | ICD-10-CM

## 2024-05-23 DIAGNOSIS — I2510 Atherosclerotic heart disease of native coronary artery without angina pectoris: Secondary | ICD-10-CM

## 2024-05-23 DIAGNOSIS — I340 Nonrheumatic mitral (valve) insufficiency: Secondary | ICD-10-CM

## 2024-05-23 DIAGNOSIS — I10 Essential (primary) hypertension: Secondary | ICD-10-CM

## 2024-05-23 DIAGNOSIS — G4733 Obstructive sleep apnea (adult) (pediatric): Secondary | ICD-10-CM

## 2024-05-23 DIAGNOSIS — E66813 Obesity, class 3: Secondary | ICD-10-CM

## 2024-05-23 NOTE — Progress Notes (Unsigned)
 LIPID CLINIC CONSULT NOTE  Chief Complaint:  Follow-up dyslipidemia  Primary Care Physician: Domenica Harlene LABOR, MD  Primary Cardiologist:  Vinie JAYSON Maxcy, MD  HPI:  Destiny [name] is a 73 y.o. female who is being seen today for the evaluation of dyslipidemia at the request of Domenica Harlene LABOR, MD. This is a pleasant 73 year old female with past medical history significant for moderate obesity, hypertension, dyslipidemia, hypothyroidism, palpitations and prior chest pain which led to heart catheterization in November 2019.  This demonstrated a 50% proximal circumflex lesion which was not significant by FFR, a small 75% ostial diagonal lesion as well as a mid LAD lesion of 25% stenosis.  LVEDP was elevated at 20 mmHg and LVEF was normal.  Based on these findings aggressive medical therapy was recommended.  At that time her chest pain seemed somewhat atypical and according to her still persists.  It feels like a twinge and is present at rest and with exertion.  She was however found to have a markedly abnormal lipid profile.  In addition she is struggled with longstanding hypothyroidism and recently had converted from Armour Thyroid  over to Synthroid .  Her dose was increased up to 100 mcg daily by her PCP in July.  Was a month ago which showed total cholesterol 263, HDL 39, triglycerides 216, and LDL 183.  He has had triglycerides over 400 in the past.  She reports her diet has been generally unhealthy and does eat a lot of fast food, fried foods and saturated fats.  Recently she started the 56day diet which she says is good so far and she feels like she would likely see a change in her lipids.  She also has a history of statin intolerance.  She has been tried on both rosuvastatin , atorvastatin, and ezetimibe, all of which have caused significant worsening of myalgias.  04/24/2019  Destiny [name] returns today for follow-up of dyslipidemia.  She successfully been using Repatha  which is both been affordable  and easy to use.  She has no significant side effects.  She has discontinued her red yeast rice.  Unfortunately she has not had repeat lipid testing.  We will plan to do that today since she is fasting.  She denies any side effects such as myalgia.  10/23/2019  Destiny [name] is seen today for follow-up.  She is done well on Repatha .  Total cholesterol now 128, triglycerides 158, HDL 44 and LDL 51.  She reports some occasional chest discomfort, not associated with exertion always or relieved by rest.  It is not limited her activities.  She has not required any nitro.  She has not had a new prescription in a while.  Her cardiologist Dr. Edwyna saw her last in 2019.  I would advise follow-up there.  12/06/2020  Destiny [name] is seen today in follow-up.  Overall she continues to do well on Repatha .  Recent lipids showed a slight increase in triglycerides however she had some difficulty with her thyroid  and was somewhat hypothyroid, now it has been repleted.  Total cholesterol 128, triglycerides 211, HDL 38 and LDL 56.  Overall she says she feels well denies chest pain or worsening shortness of breath.  05/07/2022  Destiny [name] returns today for follow-up.  She has had a significant improvement in her lipids on Repatha .  She is tolerating this quite well.  Total cholesterol now 130, triglycerides 84, HDL 54 and LDL 60.  She denies any chest pain or shortness of breath.  EKG today  is stable.  11/27/2022  Destiny [name] returns today for follow-up.  She had called in the office because her Apple Watch indicated an episode of atrial fibrillation.  She reports she has had episodes of palpitations going on for several years and decided to do an EKG with her Apple Watch at night because she felt strange.  This did show atrial fibrillation.  The overall duration was fairly short however given her age and comorbidities her CHA2DS2-VASc score is 4 and is increased risk of stroke.  EKG today shows she is back in sinus  rhythm.  05/24/2023  Destiny [name] is seen today in follow-up.  Since I last saw her she has had a lot of stress at home.  She was diagnosed with severe obstructive sleep apnea but has some trepidation about starting on therapy.  She says she might be willing to try it.  Her AHI was over 50 an hour.  Her cholesterol is gone up as well.  LDL is 105 now from 60.  She reports compliance with Repatha  but she says her diet has fallen apart.  She has taken a break from it.  Her A1c also was 5.9%.  Blood pressure is elevated today.  She says her thyroid  was also out of whack.  She intends to continue to work on this.  07/23/2023  Destiny [name] is seen today in follow-up.  She had recent echocardiography performed by her primary care provider in December for murmur.  Echo showed LVEF 70 to 75%, grade 1 diastolic dysfunction, there was a description of a cystic appearance of the posterior mitral annulus consistent with caseous mitral annular calcification.  There was mild mitral stenosis.  I discussed these findings with her today.  We scheduled this appointment as the echocardiographic abnormality needs further evaluation.  In review of the literature caseous mitral annular calcification can be associated with arrhythmias, increased risk for stroke and possibly endocarditis.  How to Katzen[place] manage it is actually debatable however there is some literature suggesting surgical resection and mitral valve reconstruction has been performed.  At the current time is not clear that she is symptomatic.  12/01/2023  Destiny [name] is seen today in follow-up.  Overall she seems to be doing fairly well.  She is on Repatha  and recent lipid showed total cholesterol 125, triglycerides 188, HDL 42 and LDL 52.  She has undergone a number of imaging studies as mentioned above including echo and transesophageal echo which showed caseous mitral annular calcification.  As mentioned my literature review suggests that this could be associated with  future risk of arrhythmias, stroke and endocarditis.  Currently she seems to be asymptomatic with it.  I have referred her to CT surgery locally for evaluation (Dr. Maryjane), and he suggested no surgery as it would be risky and of probably little benefit.  It is unclear how to Mi manage this going forward or as to whether or not we should just wait and evaluate for potential complications.   05/23/2024  Destiny [name] is returning today for follow-up.  She has had very good control of her lipids and in June had total cholesterol 125, HDL 42, triglycerides 188 and LDL 52.  She was referred to [facility] because of caseous mitral annular calcification and seen by Dr. Ricky.  They felt since the valve was working appropriately that there was no role for prophylactic surgery despite the high risk of complications associated with caseous mitral annular calcification.  She says she remains asymptomatic.  Blood  pressure low normal today.   PMHx:  Past Medical History:  Diagnosis Date   Abnormal blood chemistry    Arthropathy    Back pain    Breast cyst    Cellulitis    Chest discomfort    Chronic bronchitis (HCC)    Constipation    Disturbance of skin sensation    Edema    Encounter for screening and preventative care 06/17/2016   Fatty liver    GERD (gastroesophageal reflux disease)    Hiatal hernia with gastroesophageal reflux 06/17/2016   High risk medication use    Hoarseness 06/15/2016   Hyperglycemia    Hyperglycemia 12/21/2016   Hyperlipidemia    Hyperlipidemia, mixed 06/15/2016   Hypertension    Hypopotassemia    Hypothyroidism    Joint pain    Menopause    Obesity    Palpitations    Positive ANA (antinuclear antibody)    Pre-diabetes    Shoulder pain, right    Skin cancer    Leg   Sleep apnea    denies   SOB (shortness of breath)    Thyroid  disease 12/11/2014   Thyroid  nodule    Urinary incontinence 04/01/2017   Vitamin D  deficiency    Vitamin D  deficiency     Past Surgical History:   Procedure Laterality Date   BREAST CYST ASPIRATION Left    30 years ago  1970's or 1980's   CORONARY PRESSURE/FFR STUDY N/A 04/25/2018   Procedure: INTRAVASCULAR PRESSURE WIRE/FFR STUDY;  Surgeon: Dann Candyce RAMAN, MD;  Location: [facility] INVASIVE CV LAB;  Service: Cardiovascular;  Laterality: N/A;   LEFT HEART CATH AND CORONARY ANGIOGRAPHY N/A 04/25/2018   Procedure: LEFT HEART CATH AND CORONARY ANGIOGRAPHY;  Surgeon: Dann Candyce RAMAN, MD;  Location: [facility] INVASIVE CV LAB;  Service: Cardiovascular;  Laterality: N/A;   LIPOSUCTION  1990   over gluteal area    TRANSESOPHAGEAL ECHOCARDIOGRAM (CATH LAB) N/A 08/12/2023   Procedure: TRANSESOPHAGEAL ECHOCARDIOGRAM;  Surgeon: Santo Stanly LABOR, MD;  Location: [facility] INVASIVE CV LAB;  Service: Cardiovascular;  Laterality: N/A;   VEIN SURGERY Left 2022   WISDOM TOOTH EXTRACTION     and all upper teeth    FAMHx:  Family History  Problem Relation Age of Onset   Irregular heart beat Mother    Heart disease Mother    Rheumatic fever Mother    Hypertension Father    Ulcers Father    Hyperlipidemia Father    Stroke Maternal Grandmother    Diabetes Maternal Grandmother    Hypertension Paternal Grandmother    Ulcers Paternal Grandfather     SOCHx:   reports that she quit smoking about 14 years ago. Her smoking use included cigarettes. She started smoking about 41 years ago. She has a 27 pack-year smoking history. She has never used smokeless tobacco. She reports current alcohol use. She reports that she does not use drugs.  ALLERGIES:  Allergies  Allergen Reactions   Diclofenac  Itching, Nausea Only and Other (See Comments)    Wheezing   Hydrochlorothiazide      Wheezing. Losing teeth.   Mirtazapine     Unknown reaction    Rosuvastatin     Statins     myalgia    ROS: Pertinent items noted in HPI and remainder of comprehensive ROS otherwise negative.  HOME MEDS: Current Outpatient Medications on File Prior to Visit  Medication Sig  Dispense Refill   ELIQUIS  5 MG TABS tablet TAKE ONE TABLET BY MOUTH TWICE A DAY 180  tablet 3   Evolocumab  (REPATHA  SURECLICK) 140 MG/ML SOAJ INJECT 1 PEN (140MG ) SUBCUTANEOUSLY EVERY 14 DAYS 6 mL 1   furosemide  (LASIX ) 20 MG tablet Take 40 mg by mouth daily.     irbesartan  (AVAPRO ) 150 MG tablet Take 1 tablet (150 mg total) by mouth daily. 90 tablet 0   metoprolol  succinate (TOPROL -XL) 25 MG 24 hr tablet Take 1 tablet (25 mg total) by mouth daily. Take with or immediately following a meal 90 tablet 0   thyroid  (ARMOUR THYROID ) 15 MG tablet Take 1 tablet (15 mg total) by mouth daily. Take with 120mg  daily 90 tablet 0   thyroid  (ARMOUR) 120 MG tablet Take 1 tablet (120 mg total) by mouth daily before breakfast. Take with 15 mg to equal 135 mg 90 tablet 0   tirzepatide  (ZEPBOUND ) 15 MG/0.5ML Pen Inject 15 mg into the skin once a week. 2 mL 5   Vibegron  (GEMTESA ) 75 MG TABS Take 1 tablet (75 mg total) by mouth daily. 90 tablet 3   Vitamin D , Ergocalciferol , (DRISDOL ) 1.25 MG (50000 UNIT) CAPS capsule TAKE ONE CAPSULE BY MOUTH EVERY SEVEN DAYS (MAY CONTAIN SOY OR PEANUT--TALK TO YOUR DOCTOR BEFORE TAKING IF YOU ARE ALLERGIC) 12 capsule 3   albuterol  (VENTOLIN  HFA) 108 (90 Base) MCG/ACT inhaler Inhale 2 puffs into the lungs every 6 (six) hours as needed for wheezing or shortness of breath. (Patient not taking: Reported on 05/23/2024) 18 g 5   Coenzyme Q10 (COQ-10 PO) Take 1 tablet by mouth daily. (Patient not taking: Reported on 05/23/2024)     furosemide  (LASIX ) 40 MG tablet Take 1 tablet (40 mg total) by mouth daily. prn weight gain>3#/24 hours, SOB 90 tablet 3   nitroGLYCERIN  (NITROSTAT ) 0.4 MG SL tablet Place 1 tablet (0.4 mg total) under the tongue every 5 (five) minutes as needed for chest pain. (Patient not taking: Reported on 05/23/2024) 25 tablet 1   OVER THE COUNTER MEDICATION Take 3 tablets by mouth daily. Nutrifi rejuveniix supplement (Patient not taking: Reported on 05/23/2024)     OVER THE  COUNTER MEDICATION Take 2 tablets by mouth daily. Nutrifi optimal-m (Patient not taking: Reported on 05/23/2024)     OVER THE COUNTER MEDICATION Take 2 tablets by mouth daily. Nutrifi optimal-v (Patient not taking: Reported on 05/23/2024)     No current facility-administered medications on file prior to visit.    LABS/IMAGING: No results found for this or any previous visit (from the past 48 hours). No results found.  LIPID PANEL:    Component Value Date/Time   CHOL 125 11/22/2023 0958   TRIG 188 (H) 11/22/2023 0958   HDL 42 11/22/2023 0958   CHOLHDL 3.0 11/22/2023 0958   CHOLHDL 3 04/18/2020 1418   VLDL 47.4 (H) 04/18/2020 1418   LDLCALC 52 11/22/2023 0958   LDLCALC 183 (H) 12/08/2018 1531   LDLDIRECT 71.0 04/18/2020 1418    WEIGHTS: Wt Readings from Last 3 Encounters:  05/23/24 207 lb 3.2 oz (94 kg)  03/07/24 221 lb (100.2 kg)  11/30/23 228 lb (103.4 kg)    VITALS: BP 102/63 (BP Location: Left Arm, Patient Position: Sitting, Cuff Size: Large)   Pulse 86   Ht 5' 3 (1.6 m)   Wt 207 lb 3.2 oz (94 kg)   SpO2 95%   BMI 36.70 kg/m   EXAM: General appearance: alert and no distress Lungs: clear to auscultation bilaterally Heart: regular rate and rhythm, S1, S2 normal, and systolic murmur: early systolic 2/6, blowing at apex  Extremities: extremities normal, atraumatic, no cyanosis or edema Neurologic: Grossly normal  EKG: EKG Interpretation Date/Time:  Tuesday May 23 2024 15:50:56 EST Ventricular Rate:  86 PR Interval:  164 QRS Duration:  96 QT Interval:  380 QTC Calculation: 454 R Axis:   -84  Text Interpretation: Normal sinus rhythm Possible Left atrial enlargement Left axis deviation Incomplete right bundle branch block Inferior Q waves, possible inferior MI No significant change since last tracing Confirmed by Mona Kent [phone]) on 05/23/2024 4:16:12 PM    ASSESSMENT: Caseous posterior mitral annular calcification Paroxysmal atrial  fibrillation-CHA2DS2-VASc score 4 Mixed dyslipidemia with elevated LDL and triglycerides Coronary artery disease with moderate to severe disease by cath (04/2018)-managed medically Morbid obesity Hypertension Palpitations Hypothyroidism Severe OSA  PLAN: 1.   Ms. Fahringer continues to be asymptomatic with regards to her cases posterior mitral annular calcification.  She was seen at [facility] by Dr. Ricky who felt it Boehner to watch for valve dysfunction before considering surgical intervention.  Will plan a repeat echo in March for follow-up.  Follow-up in 6 months or sooner as necessary.  Kent KYM Mona, MD, [facility], FNLA, FACP  [place]  [facility]  Medical Director of the [facility] Lipid Disorders &  Cardiovascular Risk Reduction Clinic Diplomate of the American Board of Clinical Lipidology Attending Cardiologist  Direct Dial: [phone]  Fax: [phone]  Website:  [email or web address] Kent BROCKS Miranda Frese 05/23/2024, 4:16 PM

## 2024-05-23 NOTE — Patient Instructions (Signed)
 Medication Instructions:  NO CHANGES  *If you need a refill on your cardiac medications before your next appointment, please call your pharmacy*  Testing/Procedures: Your physician has requested that you have an echocardiogram due March 2026. Echocardiography is a painless test that uses sound waves to create images of your heart. It provides your doctor with information about the size and shape of your heart and how well your hearts chambers and valves are working. This procedure takes approximately one hour. There are no restrictions for this procedure. Please do NOT wear cologne, perfume, aftershave, or lotions (deodorant is allowed). Please arrive 15 minutes prior to your appointment time.  Please note: We ask at that you not bring children with you during ultrasound (echo/ vascular) testing. Due to room size and safety concerns, children are not allowed in the ultrasound rooms during exams. Our front office staff cannot provide observation of children in our lobby area while testing is being conducted. An adult accompanying a patient to their appointment will only be allowed in the ultrasound room at the discretion of the ultrasound technician under special circumstances. We apologize for any inconvenience.   Follow-Up: At [facility], you and your health needs are our priority.  As part of our continuing mission to provide you with exceptional heart care, our providers are all part of one team.  This team includes your primary Cardiologist (physician) and Advanced Practice Providers or APPs (Physician Assistants and Nurse Practitioners) who all work together to provide you with the care you need, when you need it.   6 months with Dr. Mona  ** call in Feb/March for a June appointment  We recommend signing up for the patient portal called MyChart.  Sign up information is provided on this After Visit Summary.  MyChart is used to connect with patients for Virtual Visits  (Telemedicine).  Patients are able to view lab/test results, encounter notes, upcoming appointments, etc.  Non-urgent messages can be sent to your provider as well.   To learn more about what you can do with MyChart, go to [email or web address].   Other Instructions

## 2024-05-24 ENCOUNTER — Encounter: Payer: Self-pay | Admitting: Student

## 2024-05-24 DIAGNOSIS — I480 Paroxysmal atrial fibrillation: Secondary | ICD-10-CM | POA: Insufficient documentation

## 2024-05-24 DIAGNOSIS — R7303 Prediabetes: Secondary | ICD-10-CM | POA: Insufficient documentation

## 2024-05-24 DIAGNOSIS — G4733 Obstructive sleep apnea (adult) (pediatric): Secondary | ICD-10-CM | POA: Insufficient documentation

## 2024-05-24 DIAGNOSIS — Z87891 Personal history of nicotine dependence: Secondary | ICD-10-CM | POA: Insufficient documentation

## 2024-05-24 DIAGNOSIS — N3281 Overactive bladder: Secondary | ICD-10-CM | POA: Insufficient documentation

## 2024-05-24 DIAGNOSIS — E669 Obesity, unspecified: Secondary | ICD-10-CM | POA: Insufficient documentation

## 2024-05-24 NOTE — Progress Notes (Unsigned)
 Subjective:     Patient ID: Destiny [name], female    DOB: September 20, 1950, 73 y.o.   MRN: [id number]  No chief complaint on file.   HPI  Discussed the use of AI scribe software for clinical note transcription with the patient, who gave verbal consent to proceed.  History of Present Illness          Following with: Cardiology, Urology  OSA-- using CPAP? Pulmonology??? [place] cancer screening?   Patient denies fever, chills, SOB, CP, palpitations, dyspnea, edema, HA, vision changes, N/V/D, abdominal pain, urinary symptoms, rash, weight changes, and recent illness or hospitalizations.   Health Maintenance Due  Topic Date Due   Zoster Vaccines- Shingrix (1 of 2) Never done   Lung Cancer Screening  Never done   COVID-19 Vaccine (3 - Pfizer risk series) 11/29/2019   Medicare Annual Wellness (AWV)  04/23/2023   Influenza Vaccine  01/07/2024   Colonoscopy  08/15/2024    Past Medical History:  Diagnosis Date   Abnormal blood chemistry    Arthropathy    Back pain    Breast cyst    Cellulitis    Chest discomfort    Chronic bronchitis (HCC)    Constipation    Disturbance of skin sensation    Edema    Encounter for screening and preventative care 06/17/2016   Fatty liver    GERD (gastroesophageal reflux disease)    Hiatal hernia with gastroesophageal reflux 06/17/2016   High risk medication use    Hoarseness 06/15/2016   Hyperglycemia    Hyperglycemia 12/21/2016   Hyperlipidemia    Hyperlipidemia, mixed 06/15/2016   Hypertension    Hypopotassemia    Hypothyroidism    Joint pain    Menopause    Obesity    Palpitations    Positive ANA (antinuclear antibody)    Pre-diabetes    Shoulder pain, right    Skin cancer    Leg   Sleep apnea    denies   SOB (shortness of breath)    Thyroid  disease 12/11/2014   Thyroid  nodule    Urinary incontinence 04/01/2017   Vitamin D  deficiency    Vitamin D  deficiency     Past Surgical History:  Procedure Laterality Date    BREAST CYST ASPIRATION Left    30 years ago  1970's or 1980's   CORONARY PRESSURE/FFR STUDY N/A 04/25/2018   Procedure: INTRAVASCULAR PRESSURE WIRE/FFR STUDY;  Surgeon: Dann Candyce RAMAN, MD;  Location: [facility] INVASIVE CV LAB;  Service: Cardiovascular;  Laterality: N/A;   LEFT HEART CATH AND CORONARY ANGIOGRAPHY N/A 04/25/2018   Procedure: LEFT HEART CATH AND CORONARY ANGIOGRAPHY;  Surgeon: Dann Candyce RAMAN, MD;  [facility]: [facility] INVASIVE CV LAB;  Service: Cardiovascular;  Laterality: N/A;   LIPOSUCTION  1990   over gluteal area    TRANSESOPHAGEAL ECHOCARDIOGRAM (CATH LAB) N/A 08/12/2023   Procedure: TRANSESOPHAGEAL ECHOCARDIOGRAM;  Surgeon: Santo Stanly DELENA, MD;  Location: [facility] INVASIVE CV LAB;  Service: Cardiovascular;  Laterality: N/A;   VEIN SURGERY Left 2022   WISDOM TOOTH EXTRACTION     and all upper teeth    Family History  Problem Relation Age of Onset   Irregular heart beat Mother    Heart disease Mother    Rheumatic fever Mother    Hypertension Father    Ulcers Father    Hyperlipidemia Father    Stroke Maternal Grandmother    Diabetes Maternal Grandmother    Hypertension Paternal Grandmother    Ulcers Paternal Actor  Social History   Socioeconomic History   Marital status: Married    Spouse name: Ersel Wadleigh   Number of children: Not on file   Years of education: Not on file   Highest education level: Not on file  Occupational History   Occupation: homemaker  Tobacco Use   Smoking status: Former    Current packs/day: 0.00    Average packs/day: 1 pack/day for 27.0 years (27.0 ttl pk-yrs)    Types: Cigarettes    Start date: 89    Quit date: 2011    Years since quitting: 14.9   Smokeless tobacco: Never  Vaping Use   Vaping status: Never Used  Substance and Sexual Activity   Alcohol use: Yes    Alcohol/week: 0.0 standard drinks of alcohol   Drug use: No   Sexual activity: Not Currently    Birth control/protection: Post-menopausal    Comment:  menopause around age [age]  Other Topics Concern   Not on file  Social History Narrative   Occasional alcohol use: hard liquor, wine and beer   Social Drivers of Health   Tobacco Use: Medium Risk (05/23/2024)   Patient History    Smoking Tobacco Use: Former    Smokeless Tobacco Use: Never    Passive Exposure: Not on file  Financial Resource Strain: Low Risk (04/22/2022)   Overall Financial Resource Strain (CARDIA)    Difficulty of Paying Living Expenses: Not hard at all  Food Insecurity: Low Risk (08/18/2023)   Received from [facility]   Epic    Within the past 12 months, you worried that your food would run out before you got money to buy more: Never true    Within the past 12 months, the food you bought just didn't last and you didn't have money to get more. : Never true  Transportation Needs: No Transportation Needs (08/18/2023)   Received from [facility]    In the past 12 months, has lack of reliable transportation kept you from medical appointments, meetings, work or from getting things needed for daily living? : No  Physical Activity: Sufficiently Active (04/22/2022)   Exercise Vital Sign    Days of Exercise per Week: 3 days    Minutes of Exercise per Session: 90 min  Stress: No Stress Concern Present (04/22/2022)   [employer] of Occupational Health - Occupational Stress Questionnaire    Feeling of Stress : Not at all  Social Connections: Socially Integrated (04/22/2022)   Social Connection and Isolation Panel    Frequency of Communication with Friends and Family: More than three times a week    Frequency of Social Gatherings with Friends and Family: More than three times a week    Attends Religious Services: More than 4 times per year    Active Member of Clubs or Organizations: Yes    Attends Banker Meetings: More than 4 times per year    Marital Status: Married  Catering Manager Violence: Not At Risk (04/22/2022)    Humiliation, Afraid, Rape, and Kick questionnaire    Fear of Current or Ex-Partner: No    Emotionally Abused: No    Physically Abused: No    Sexually Abused: No  Depression (PHQ2-9): Low Risk (03/07/2024)   Depression (PHQ2-9)    PHQ-2 Score: 0  Alcohol Screen: Low Risk (04/22/2022)   Alcohol Screen    Last Alcohol Screening Score (AUDIT): 1  Housing: Unknown (02/24/2024)   Received from [facility]   Epic  Unable to Pay for Housing in the Last Year: Not on file    Number of Times Moved in the Last Year: Not on file    At any time in the past 12 months, were you homeless or living in a shelter (including now)?: No  Utilities: Low Risk (08/18/2023)   Received from [facility]   Utilities    In the past 12 months has the electric, gas, oil, or water company threatened to shut off services in your home? : No  Health Literacy: Not on file    Outpatient Medications Prior to Visit  Medication Sig Dispense Refill   albuterol  (VENTOLIN  HFA) 108 (90 Base) MCG/ACT inhaler Inhale 2 puffs into the lungs every 6 (six) hours as needed for wheezing or shortness of breath. (Patient not taking: Reported on 05/23/2024) 18 g 5   Coenzyme Q10 (COQ-10 PO) Take 1 tablet by mouth daily. (Patient not taking: Reported on 05/23/2024)     ELIQUIS  5 MG TABS tablet TAKE ONE TABLET BY MOUTH TWICE A DAY 180 tablet 3   Evolocumab  (REPATHA  SURECLICK) 140 MG/ML SOAJ INJECT 1 PEN (140MG ) SUBCUTANEOUSLY EVERY 14 DAYS 6 mL 1   furosemide  (LASIX ) 20 MG tablet Take 40 mg by mouth daily.     furosemide  (LASIX ) 40 MG tablet Take 1 tablet (40 mg total) by mouth daily. prn weight gain>3#/24 hours, SOB 90 tablet 3   irbesartan  (AVAPRO ) 150 MG tablet Take 1 tablet (150 mg total) by mouth daily. 90 tablet 0   metoprolol  succinate (TOPROL -XL) 25 MG 24 hr tablet Take 1 tablet (25 mg total) by mouth daily. Take with or immediately following a meal 90 tablet 0   nitroGLYCERIN  (NITROSTAT ) 0.4 MG SL tablet Place 1  tablet (0.4 mg total) under the tongue every 5 (five) minutes as needed for chest pain. (Patient not taking: Reported on 05/23/2024) 25 tablet 1   OVER THE COUNTER MEDICATION Take 3 tablets by mouth daily. Nutrifi rejuveniix supplement (Patient not taking: Reported on 05/23/2024)     OVER THE COUNTER MEDICATION Take 2 tablets by mouth daily. Nutrifi optimal-m (Patient not taking: Reported on 05/23/2024)     OVER THE COUNTER MEDICATION Take 2 tablets by mouth daily. Nutrifi optimal-v (Patient not taking: Reported on 05/23/2024)     thyroid  (ARMOUR THYROID ) 15 MG tablet Take 1 tablet (15 mg total) by mouth daily. Take with 120mg  daily 90 tablet 0   thyroid  (ARMOUR) 120 MG tablet Take 1 tablet (120 mg total) by mouth daily before breakfast. Take with 15 mg to equal 135 mg 90 tablet 0   tirzepatide  (ZEPBOUND ) 15 MG/0.5ML Pen Inject 15 mg into the skin once a week. 2 mL 5   Vibegron  (GEMTESA ) 75 MG TABS Take 1 tablet (75 mg total) by mouth daily. 90 tablet 3   Vitamin D , Ergocalciferol , (DRISDOL ) 1.25 MG (50000 UNIT) CAPS capsule TAKE ONE CAPSULE BY MOUTH EVERY SEVEN DAYS (MAY CONTAIN SOY OR PEANUT--TALK TO YOUR DOCTOR BEFORE TAKING IF YOU ARE ALLERGIC) 12 capsule 3   No facility-administered medications prior to visit.    Allergies[1]  ROS     Objective:    Physical Exam   There were no vitals taken for this visit. Wt Readings from Last 3 Encounters:  05/23/24 207 lb 3.2 oz (94 kg)  03/07/24 221 lb (100.2 kg)  11/30/23 228 lb (103.4 kg)       Assessment & Plan:   Problem List Items Addressed This Visit  Cardiovascular and Mediastinum   CAD (coronary artery disease) - Primary   Stable. Asymptomatic. Following with Cardiology.       Essential hypertension   Well controlled, no changes to meds. Encouraged heart healthy diet such as the DASH diet and exercise as tolerated.        Paroxysmal atrial fibrillation (HCC)   Stable on Eliquis . Managed by cardiology.         Respiratory   OSA (obstructive sleep apnea)     Digestive   Fatty liver disease, nonalcoholic     Endocrine   Hypothyroidism (Chronic)   On Armour Thyroid . Following with Endocrinology, Elsie Zachary Claudene Mickey., MD  with [facility]. Last TSH Stable on 02/2024 TSH 1.282  Continue follow up with endocrinology.         Musculoskeletal and Integument   Osteopenia   Last DEXA 1/022- shows osteopenia. Encouraged daily intake of calcium  and vitamin D . Advised engaging in regular exercise as tolerated to support overall bone and CV health.  Encourage updating DEXA scan.         Genitourinary   OAB (overactive bladder)   Following with Urology.        Other   History of tobacco use   Hx tobacco use 27 years. Quit smoking in 2011. No longer meets guidelines for LDCT.      Mixed hyperlipidemia   Tolerating Repatha . Following with Cardiology. Encourage heart healthy diet such as MIND or DASH diet, increase exercise, avoid trans fats, simple carbohydrates and processed foods, consider a krill or fish or flaxseed oil cap daily.   LDL goal <70       Obesity (BMI 30-39.9)   Encouraged DASH or MIND diet, decrease po intake and increase exercise as tolerated. Needs 7-8 hours of sleep nightly. Avoid trans fats, eat small, frequent meals every 4-5 hours with lean proteins, complex carbs and healthy fats. Minimize simple carbs, high fat foods and processed foods        Pre-diabetes   hgba1c acceptable, minimize simple carbs. Increase exercise as tolerated. Continue current meds       Vitamin B12 deficiency   Supplement and monitor       Vitamin D  deficiency   Supplement and monitor        I am having Sameria A. Musselman Jan maintain her albuterol , nitroGLYCERIN , OVER THE COUNTER MEDICATION, OVER THE COUNTER MEDICATION, OVER THE COUNTER MEDICATION, Coenzyme Q10 (COQ-10 PO), furosemide , Eliquis , Vitamin D  (Ergocalciferol ), Zepbound , Repatha  SureClick, metoprolol  succinate, irbesartan ,  thyroid , thyroid , Gemtesa , and furosemide .  No orders of the defined types were placed in this encounter.     [1]  Allergies Allergen Reactions   Diclofenac  Itching, Nausea Only and Other (See Comments)    Wheezing   Hydrochlorothiazide      Wheezing. Losing teeth.   Mirtazapine     Unknown reaction    Rosuvastatin     Statins     myalgia

## 2024-05-24 NOTE — Assessment & Plan Note (Signed)
 Tolerating Repatha . Following with Cardiology. Encourage heart healthy diet such as MIND or DASH diet, increase exercise, avoid trans fats, simple carbohydrates and processed foods, consider a krill or fish or flaxseed oil cap daily.   LDL goal <70

## 2024-05-24 NOTE — Assessment & Plan Note (Signed)
 Following with Urology.

## 2024-05-24 NOTE — Assessment & Plan Note (Signed)
 Supplement and monitor

## 2024-05-24 NOTE — Assessment & Plan Note (Addendum)
 On Armour Thyroid . Following with Endocrinology, Destiny [name]., MD  with [facility]. Last TSH Stable on 02/2024 TSH 1.282  Continue follow up with endocrinology.

## 2024-05-24 NOTE — Assessment & Plan Note (Signed)
 Stable on Eliquis . Managed by cardiology.

## 2024-05-24 NOTE — Assessment & Plan Note (Signed)
 hgba1c acceptable, minimize simple carbs. Increase exercise as tolerated. Continue current meds

## 2024-05-24 NOTE — Assessment & Plan Note (Signed)
 Stable. Asymptomatic. Following with Cardiology.

## 2024-05-24 NOTE — Assessment & Plan Note (Signed)
 Encouraged DASH or MIND diet, decrease po intake and increase exercise as tolerated. Needs 7-8 hours of sleep nightly. Avoid trans fats, eat small, frequent meals every 4-5 hours with lean proteins, complex carbs and healthy fats. Minimize simple carbs, high fat foods and processed foods

## 2024-05-24 NOTE — Assessment & Plan Note (Signed)
 Refer for LDCT

## 2024-05-24 NOTE — Assessment & Plan Note (Signed)
 Well controlled, no changes to meds. Encouraged heart healthy diet such as the DASH diet and exercise as tolerated.

## 2024-05-24 NOTE — Assessment & Plan Note (Signed)
 Last DEXA 1/022- shows osteopenia. Encouraged daily intake of calcium  and vitamin D . Advised engaging in regular exercise as tolerated to support overall bone and CV health.  Encourage updating DEXA scan.

## 2024-05-25 ENCOUNTER — Encounter: Payer: Self-pay | Admitting: Student

## 2024-05-25 ENCOUNTER — Ambulatory Visit (INDEPENDENT_AMBULATORY_CARE_PROVIDER_SITE_OTHER): Admitting: Student

## 2024-05-25 VITALS — BP 108/71 | HR 83 | Wt 207.2 lb

## 2024-05-25 DIAGNOSIS — E669 Obesity, unspecified: Secondary | ICD-10-CM | POA: Diagnosis not present

## 2024-05-25 DIAGNOSIS — N3281 Overactive bladder: Secondary | ICD-10-CM

## 2024-05-25 DIAGNOSIS — K760 Fatty (change of) liver, not elsewhere classified: Secondary | ICD-10-CM

## 2024-05-25 DIAGNOSIS — E782 Mixed hyperlipidemia: Secondary | ICD-10-CM | POA: Diagnosis not present

## 2024-05-25 DIAGNOSIS — E039 Hypothyroidism, unspecified: Secondary | ICD-10-CM | POA: Diagnosis not present

## 2024-05-25 DIAGNOSIS — I480 Paroxysmal atrial fibrillation: Secondary | ICD-10-CM

## 2024-05-25 DIAGNOSIS — E538 Deficiency of other specified B group vitamins: Secondary | ICD-10-CM

## 2024-05-25 DIAGNOSIS — R7303 Prediabetes: Secondary | ICD-10-CM

## 2024-05-25 DIAGNOSIS — M8580 Other specified disorders of bone density and structure, unspecified site: Secondary | ICD-10-CM

## 2024-05-25 DIAGNOSIS — Z780 Asymptomatic menopausal state: Secondary | ICD-10-CM

## 2024-05-25 DIAGNOSIS — Z0000 Encounter for general adult medical examination without abnormal findings: Secondary | ICD-10-CM

## 2024-05-25 DIAGNOSIS — Z87891 Personal history of nicotine dependence: Secondary | ICD-10-CM

## 2024-05-25 DIAGNOSIS — I2510 Atherosclerotic heart disease of native coronary artery without angina pectoris: Secondary | ICD-10-CM

## 2024-05-25 DIAGNOSIS — Z23 Encounter for immunization: Secondary | ICD-10-CM

## 2024-05-25 DIAGNOSIS — I119 Hypertensive heart disease without heart failure: Secondary | ICD-10-CM | POA: Diagnosis not present

## 2024-05-25 DIAGNOSIS — E559 Vitamin D deficiency, unspecified: Secondary | ICD-10-CM

## 2024-05-25 DIAGNOSIS — G4733 Obstructive sleep apnea (adult) (pediatric): Secondary | ICD-10-CM

## 2024-05-25 DIAGNOSIS — I10 Essential (primary) hypertension: Secondary | ICD-10-CM | POA: Diagnosis not present

## 2024-05-25 DIAGNOSIS — Z1211 Encounter for screening for malignant neoplasm of colon: Secondary | ICD-10-CM

## 2024-05-25 MED ORDER — THYROID 120 MG PO TABS: 120.0000 mg | ORAL_TABLET | Freq: Every day | ORAL | 1 refills | Status: AC

## 2024-05-25 MED ORDER — THYROID 15 MG PO TABS: 15.0000 mg | ORAL_TABLET | Freq: Every day | ORAL | 1 refills | Status: AC

## 2024-05-25 MED ORDER — METOPROLOL SUCCINATE ER 25 MG PO TB24: 25.0000 mg | ORAL_TABLET | Freq: Every day | ORAL | 1 refills | Status: AC

## 2024-05-25 MED ORDER — IRBESARTAN 150 MG PO TABS: 150.0000 mg | ORAL_TABLET | Freq: Every day | ORAL | 1 refills | Status: AC

## 2024-05-25 NOTE — Assessment & Plan Note (Addendum)
 Patient encouraged to maintain heart healthy diet, regular exercise, adequate sleep. Consider daily probiotics. Take medications as prescribed. Labs ordered and reviewed  Colonoscopy 2016 repeat in 2026, referral sent Mgm next due 2026 Dexa last 06/2020, referral sent Immunizations: May get Shingles vaccine at pharmacy, declines Covid vaccine  LDCT Screen: Referral sent

## 2024-07-18 ENCOUNTER — Ambulatory Visit (HOSPITAL_COMMUNITY)

## 2024-08-08 ENCOUNTER — Other Ambulatory Visit (HOSPITAL_BASED_OUTPATIENT_CLINIC_OR_DEPARTMENT_OTHER)

## 2024-09-28 ENCOUNTER — Encounter: Admitting: Family Medicine

## 2024-12-07 ENCOUNTER — Encounter: Admitting: Family Medicine
